# Patient Record
Sex: Female | Born: 1935 | Race: White | Hispanic: No | Smoking: Never smoker
Health system: Southern US, Community
[De-identification: ages and names within clinical notes are randomized; demographics above are authoritative.]

## PROBLEM LIST (undated history)

## (undated) DIAGNOSIS — I5189 Other ill-defined heart diseases: Secondary | ICD-10-CM

## (undated) DIAGNOSIS — Z9889 Other specified postprocedural states: Secondary | ICD-10-CM

## (undated) DIAGNOSIS — R0600 Dyspnea, unspecified: Secondary | ICD-10-CM

## (undated) DIAGNOSIS — T7840XA Allergy, unspecified, initial encounter: Secondary | ICD-10-CM

## (undated) DIAGNOSIS — Z9289 Personal history of other medical treatment: Secondary | ICD-10-CM

## (undated) DIAGNOSIS — E875 Hyperkalemia: Secondary | ICD-10-CM

## (undated) DIAGNOSIS — D126 Benign neoplasm of colon, unspecified: Secondary | ICD-10-CM

## (undated) DIAGNOSIS — E119 Type 2 diabetes mellitus without complications: Secondary | ICD-10-CM

## (undated) DIAGNOSIS — K5730 Diverticulosis of large intestine without perforation or abscess without bleeding: Secondary | ICD-10-CM

## (undated) DIAGNOSIS — R9431 Abnormal electrocardiogram [ECG] [EKG]: Secondary | ICD-10-CM

## (undated) DIAGNOSIS — K635 Polyp of colon: Secondary | ICD-10-CM

## (undated) DIAGNOSIS — K219 Gastro-esophageal reflux disease without esophagitis: Secondary | ICD-10-CM

## (undated) DIAGNOSIS — H9190 Unspecified hearing loss, unspecified ear: Secondary | ICD-10-CM

## (undated) DIAGNOSIS — N390 Urinary tract infection, site not specified: Secondary | ICD-10-CM

## (undated) DIAGNOSIS — K589 Irritable bowel syndrome without diarrhea: Secondary | ICD-10-CM

## (undated) DIAGNOSIS — F419 Anxiety disorder, unspecified: Secondary | ICD-10-CM

## (undated) DIAGNOSIS — Z5189 Encounter for other specified aftercare: Secondary | ICD-10-CM

## (undated) DIAGNOSIS — R112 Nausea with vomiting, unspecified: Secondary | ICD-10-CM

## (undated) DIAGNOSIS — R5383 Other fatigue: Secondary | ICD-10-CM

## (undated) DIAGNOSIS — E785 Hyperlipidemia, unspecified: Secondary | ICD-10-CM

## (undated) DIAGNOSIS — E049 Nontoxic goiter, unspecified: Secondary | ICD-10-CM

## (undated) DIAGNOSIS — I341 Nonrheumatic mitral (valve) prolapse: Secondary | ICD-10-CM

## (undated) DIAGNOSIS — J479 Bronchiectasis, uncomplicated: Secondary | ICD-10-CM

## (undated) DIAGNOSIS — R0609 Other forms of dyspnea: Secondary | ICD-10-CM

## (undated) DIAGNOSIS — I10 Essential (primary) hypertension: Secondary | ICD-10-CM

## (undated) DIAGNOSIS — R159 Full incontinence of feces: Secondary | ICD-10-CM

## (undated) DIAGNOSIS — R251 Tremor, unspecified: Secondary | ICD-10-CM

## (undated) DIAGNOSIS — R000 Tachycardia, unspecified: Secondary | ICD-10-CM

## (undated) DIAGNOSIS — R002 Palpitations: Secondary | ICD-10-CM

## (undated) DIAGNOSIS — R221 Localized swelling, mass and lump, neck: Secondary | ICD-10-CM

## (undated) DIAGNOSIS — D125 Benign neoplasm of sigmoid colon: Secondary | ICD-10-CM

## (undated) DIAGNOSIS — J189 Pneumonia, unspecified organism: Secondary | ICD-10-CM

## (undated) DIAGNOSIS — M1990 Unspecified osteoarthritis, unspecified site: Secondary | ICD-10-CM

## (undated) DIAGNOSIS — R011 Cardiac murmur, unspecified: Secondary | ICD-10-CM

## (undated) DIAGNOSIS — C801 Malignant (primary) neoplasm, unspecified: Secondary | ICD-10-CM

## (undated) DIAGNOSIS — F32A Depression, unspecified: Secondary | ICD-10-CM

## (undated) DIAGNOSIS — E039 Hypothyroidism, unspecified: Secondary | ICD-10-CM

## (undated) DIAGNOSIS — R918 Other nonspecific abnormal finding of lung field: Secondary | ICD-10-CM

## (undated) DIAGNOSIS — F329 Major depressive disorder, single episode, unspecified: Secondary | ICD-10-CM

## (undated) DIAGNOSIS — J8410 Pulmonary fibrosis, unspecified: Secondary | ICD-10-CM

## (undated) HISTORY — DX: Depression, unspecified: F32.A

## (undated) HISTORY — DX: Cardiac murmur, unspecified: R01.1

## (undated) HISTORY — DX: Diverticulosis of large intestine without perforation or abscess without bleeding: K57.30

## (undated) HISTORY — PX: COLONOSCOPY: SHX174

## (undated) HISTORY — DX: Irritable bowel syndrome, unspecified: K58.9

## (undated) HISTORY — DX: Abnormal electrocardiogram (ECG) (EKG): R94.31

## (undated) HISTORY — PX: SKIN CANCER EXCISION: SHX779

## (undated) HISTORY — DX: Major depressive disorder, single episode, unspecified: F32.9

## (undated) HISTORY — PX: MASTOIDECTOMY: SHX711

## (undated) HISTORY — DX: Benign neoplasm of colon, unspecified: D12.6

## (undated) HISTORY — DX: Gastro-esophageal reflux disease without esophagitis: K21.9

## (undated) HISTORY — DX: Full incontinence of feces: R15.9

## (undated) HISTORY — DX: Nonrheumatic mitral (valve) prolapse: I34.1

## (undated) HISTORY — DX: Personal history of other medical treatment: Z92.89

## (undated) HISTORY — PX: ABDOMINAL HYSTERECTOMY: SHX81

## (undated) HISTORY — DX: Urinary tract infection, site not specified: N39.0

## (undated) HISTORY — DX: Bronchiectasis, uncomplicated: J47.9

## (undated) HISTORY — DX: Benign neoplasm of sigmoid colon: D12.5

## (undated) HISTORY — DX: Dyspnea, unspecified: R06.00

## (undated) HISTORY — PX: OOPHORECTOMY: SHX86

## (undated) HISTORY — DX: Other fatigue: R53.83

## (undated) HISTORY — PX: ESOPHAGOGASTRODUODENOSCOPY: SHX1529

## (undated) HISTORY — DX: Other nonspecific abnormal finding of lung field: R91.8

## (undated) HISTORY — DX: Localized swelling, mass and lump, neck: R22.1

## (undated) HISTORY — DX: Encounter for other specified aftercare: Z51.89

## (undated) HISTORY — DX: Malignant (primary) neoplasm, unspecified: C80.1

## (undated) HISTORY — DX: Other specified postprocedural states: R11.2

## (undated) HISTORY — DX: Polyp of colon: K63.5

## (undated) HISTORY — DX: Hypothyroidism, unspecified: E03.9

## (undated) HISTORY — DX: Other forms of dyspnea: R06.09

## (undated) HISTORY — DX: Other ill-defined heart diseases: I51.89

## (undated) HISTORY — DX: Other specified postprocedural states: Z98.890

## (undated) HISTORY — DX: Allergy, unspecified, initial encounter: T78.40XA

## (undated) HISTORY — DX: Nontoxic goiter, unspecified: E04.9

## (undated) HISTORY — DX: Palpitations: R00.2

## (undated) HISTORY — DX: Type 2 diabetes mellitus without complications: E11.9

## (undated) HISTORY — DX: Anxiety disorder, unspecified: F41.9

## (undated) HISTORY — DX: Hyperlipidemia, unspecified: E78.5

## (undated) HISTORY — DX: Essential (primary) hypertension: I10

---

## 1972-12-03 DIAGNOSIS — Z5189 Encounter for other specified aftercare: Secondary | ICD-10-CM

## 1972-12-03 DIAGNOSIS — IMO0001 Reserved for inherently not codable concepts without codable children: Secondary | ICD-10-CM

## 1972-12-03 HISTORY — DX: Encounter for other specified aftercare: Z51.89

## 1972-12-03 HISTORY — DX: Reserved for inherently not codable concepts without codable children: IMO0001

## 2001-09-24 ENCOUNTER — Encounter: Payer: Self-pay | Admitting: Gynecology

## 2001-09-24 ENCOUNTER — Ambulatory Visit (HOSPITAL_COMMUNITY): Admission: RE | Admit: 2001-09-24 | Discharge: 2001-09-24 | Payer: Self-pay | Admitting: Gynecology

## 2002-09-30 ENCOUNTER — Ambulatory Visit (HOSPITAL_COMMUNITY): Admission: RE | Admit: 2002-09-30 | Discharge: 2002-09-30 | Payer: Self-pay | Admitting: Pulmonary Disease

## 2003-10-18 ENCOUNTER — Ambulatory Visit (HOSPITAL_COMMUNITY): Admission: RE | Admit: 2003-10-18 | Discharge: 2003-10-18 | Payer: Self-pay | Admitting: Pulmonary Disease

## 2005-01-15 ENCOUNTER — Ambulatory Visit (HOSPITAL_COMMUNITY): Admission: RE | Admit: 2005-01-15 | Discharge: 2005-01-15 | Payer: Self-pay | Admitting: Obstetrics and Gynecology

## 2006-01-07 ENCOUNTER — Ambulatory Visit: Payer: Self-pay | Admitting: Family Medicine

## 2006-01-18 ENCOUNTER — Ambulatory Visit (HOSPITAL_COMMUNITY): Admission: RE | Admit: 2006-01-18 | Discharge: 2006-01-18 | Payer: Self-pay | Admitting: Family Medicine

## 2006-01-21 ENCOUNTER — Ambulatory Visit: Payer: Self-pay | Admitting: Family Medicine

## 2006-02-26 ENCOUNTER — Ambulatory Visit: Payer: Self-pay | Admitting: Family Medicine

## 2006-04-11 ENCOUNTER — Ambulatory Visit (HOSPITAL_COMMUNITY): Admission: RE | Admit: 2006-04-11 | Discharge: 2006-04-11 | Payer: Self-pay | Admitting: Family Medicine

## 2006-04-11 ENCOUNTER — Ambulatory Visit: Payer: Self-pay | Admitting: Family Medicine

## 2006-04-25 ENCOUNTER — Ambulatory Visit: Payer: Self-pay | Admitting: Family Medicine

## 2006-05-30 ENCOUNTER — Ambulatory Visit: Payer: Self-pay | Admitting: Family Medicine

## 2006-08-26 ENCOUNTER — Ambulatory Visit: Payer: Self-pay | Admitting: Family Medicine

## 2006-09-23 ENCOUNTER — Ambulatory Visit: Payer: Self-pay | Admitting: Family Medicine

## 2006-10-01 ENCOUNTER — Encounter: Admission: RE | Admit: 2006-10-01 | Discharge: 2006-10-01 | Payer: Self-pay | Admitting: Orthopedic Surgery

## 2006-10-07 ENCOUNTER — Ambulatory Visit: Payer: Self-pay | Admitting: Family Medicine

## 2006-11-18 ENCOUNTER — Ambulatory Visit: Payer: Self-pay | Admitting: Family Medicine

## 2007-01-07 ENCOUNTER — Encounter: Payer: Self-pay | Admitting: Family Medicine

## 2007-01-07 DIAGNOSIS — K589 Irritable bowel syndrome without diarrhea: Secondary | ICD-10-CM | POA: Insufficient documentation

## 2007-01-07 DIAGNOSIS — M1990 Unspecified osteoarthritis, unspecified site: Secondary | ICD-10-CM | POA: Insufficient documentation

## 2007-01-07 DIAGNOSIS — I10 Essential (primary) hypertension: Secondary | ICD-10-CM

## 2007-01-07 DIAGNOSIS — E039 Hypothyroidism, unspecified: Secondary | ICD-10-CM

## 2007-01-07 DIAGNOSIS — F419 Anxiety disorder, unspecified: Secondary | ICD-10-CM | POA: Insufficient documentation

## 2007-01-07 DIAGNOSIS — E785 Hyperlipidemia, unspecified: Secondary | ICD-10-CM

## 2007-01-07 DIAGNOSIS — G43909 Migraine, unspecified, not intractable, without status migrainosus: Secondary | ICD-10-CM | POA: Insufficient documentation

## 2007-01-07 DIAGNOSIS — F329 Major depressive disorder, single episode, unspecified: Secondary | ICD-10-CM

## 2007-01-07 DIAGNOSIS — K219 Gastro-esophageal reflux disease without esophagitis: Secondary | ICD-10-CM | POA: Insufficient documentation

## 2007-01-07 DIAGNOSIS — H9190 Unspecified hearing loss, unspecified ear: Secondary | ICD-10-CM | POA: Insufficient documentation

## 2007-01-07 DIAGNOSIS — H4089 Other specified glaucoma: Secondary | ICD-10-CM

## 2007-01-07 DIAGNOSIS — J309 Allergic rhinitis, unspecified: Secondary | ICD-10-CM

## 2007-01-27 ENCOUNTER — Telehealth (INDEPENDENT_AMBULATORY_CARE_PROVIDER_SITE_OTHER): Payer: Self-pay | Admitting: Family Medicine

## 2007-01-27 ENCOUNTER — Encounter: Payer: Self-pay | Admitting: Nurse Practitioner

## 2007-01-29 ENCOUNTER — Ambulatory Visit (HOSPITAL_COMMUNITY): Admission: RE | Admit: 2007-01-29 | Discharge: 2007-01-29 | Payer: Self-pay | Admitting: Family Medicine

## 2007-01-29 ENCOUNTER — Encounter (INDEPENDENT_AMBULATORY_CARE_PROVIDER_SITE_OTHER): Payer: Self-pay | Admitting: Family Medicine

## 2007-03-04 DIAGNOSIS — D126 Benign neoplasm of colon, unspecified: Secondary | ICD-10-CM

## 2007-03-04 HISTORY — DX: Benign neoplasm of colon, unspecified: D12.6

## 2007-03-05 ENCOUNTER — Ambulatory Visit: Payer: Self-pay | Admitting: Internal Medicine

## 2007-03-05 ENCOUNTER — Encounter (INDEPENDENT_AMBULATORY_CARE_PROVIDER_SITE_OTHER): Payer: Self-pay | Admitting: Family Medicine

## 2007-03-05 ENCOUNTER — Encounter (INDEPENDENT_AMBULATORY_CARE_PROVIDER_SITE_OTHER): Payer: Self-pay | Admitting: *Deleted

## 2007-03-05 ENCOUNTER — Encounter (INDEPENDENT_AMBULATORY_CARE_PROVIDER_SITE_OTHER): Payer: Self-pay | Admitting: Specialist

## 2007-03-05 ENCOUNTER — Ambulatory Visit (HOSPITAL_COMMUNITY): Admission: RE | Admit: 2007-03-05 | Discharge: 2007-03-05 | Payer: Self-pay | Admitting: Internal Medicine

## 2007-03-05 ENCOUNTER — Encounter: Payer: Self-pay | Admitting: Nurse Practitioner

## 2007-03-07 ENCOUNTER — Telehealth (INDEPENDENT_AMBULATORY_CARE_PROVIDER_SITE_OTHER): Payer: Self-pay | Admitting: Family Medicine

## 2007-03-11 ENCOUNTER — Ambulatory Visit: Payer: Self-pay | Admitting: Family Medicine

## 2007-03-11 LAB — CONVERTED CEMR LAB
Bilirubin Urine: NEGATIVE
Blood in Urine, dipstick: NEGATIVE
Glucose, Urine, Semiquant: NEGATIVE
pH: 7

## 2007-03-14 ENCOUNTER — Encounter (INDEPENDENT_AMBULATORY_CARE_PROVIDER_SITE_OTHER): Payer: Self-pay | Admitting: Family Medicine

## 2007-03-17 ENCOUNTER — Telehealth (INDEPENDENT_AMBULATORY_CARE_PROVIDER_SITE_OTHER): Payer: Self-pay | Admitting: Family Medicine

## 2007-03-24 ENCOUNTER — Telehealth (INDEPENDENT_AMBULATORY_CARE_PROVIDER_SITE_OTHER): Payer: Self-pay | Admitting: *Deleted

## 2007-03-27 ENCOUNTER — Telehealth (INDEPENDENT_AMBULATORY_CARE_PROVIDER_SITE_OTHER): Payer: Self-pay | Admitting: Family Medicine

## 2007-03-31 ENCOUNTER — Ambulatory Visit (HOSPITAL_COMMUNITY): Admission: RE | Admit: 2007-03-31 | Discharge: 2007-03-31 | Payer: Self-pay | Admitting: Family Medicine

## 2007-04-18 ENCOUNTER — Ambulatory Visit: Payer: Self-pay | Admitting: Family Medicine

## 2007-04-18 LAB — CONVERTED CEMR LAB
Glucose, Urine, Semiquant: NEGATIVE
Ketones, urine, test strip: NEGATIVE
Urobilinogen, UA: 0.2
pH: 5.5

## 2007-04-21 ENCOUNTER — Telehealth (INDEPENDENT_AMBULATORY_CARE_PROVIDER_SITE_OTHER): Payer: Self-pay | Admitting: Family Medicine

## 2007-04-21 LAB — CONVERTED CEMR LAB: RBC / HPF: NONE SEEN (ref <3)

## 2007-04-22 ENCOUNTER — Encounter (INDEPENDENT_AMBULATORY_CARE_PROVIDER_SITE_OTHER): Payer: Self-pay | Admitting: Family Medicine

## 2007-04-24 ENCOUNTER — Encounter (INDEPENDENT_AMBULATORY_CARE_PROVIDER_SITE_OTHER): Payer: Self-pay | Admitting: Family Medicine

## 2007-05-02 ENCOUNTER — Encounter (INDEPENDENT_AMBULATORY_CARE_PROVIDER_SITE_OTHER): Payer: Self-pay | Admitting: Family Medicine

## 2007-05-07 ENCOUNTER — Encounter (INDEPENDENT_AMBULATORY_CARE_PROVIDER_SITE_OTHER): Payer: Self-pay | Admitting: Family Medicine

## 2007-05-07 LAB — CONVERTED CEMR LAB
Ketones, ur: NEGATIVE mg/dL
Nitrite: NEGATIVE
Protein, ur: NEGATIVE mg/dL

## 2007-05-08 ENCOUNTER — Encounter (INDEPENDENT_AMBULATORY_CARE_PROVIDER_SITE_OTHER): Payer: Self-pay | Admitting: Family Medicine

## 2007-05-09 ENCOUNTER — Encounter: Payer: Self-pay | Admitting: Family Medicine

## 2007-05-09 ENCOUNTER — Ambulatory Visit: Payer: Self-pay | Admitting: Family Medicine

## 2007-05-09 ENCOUNTER — Encounter (INDEPENDENT_AMBULATORY_CARE_PROVIDER_SITE_OTHER): Payer: Self-pay | Admitting: Family Medicine

## 2007-05-09 LAB — CONVERTED CEMR LAB
Bilirubin Urine: NEGATIVE
Glucose, Urine, Semiquant: NEGATIVE
Nitrite: NEGATIVE
Protein, U semiquant: NEGATIVE
Specific Gravity, Urine: 1.015
pH: 7

## 2007-05-10 ENCOUNTER — Encounter (INDEPENDENT_AMBULATORY_CARE_PROVIDER_SITE_OTHER): Payer: Self-pay | Admitting: Family Medicine

## 2007-05-10 LAB — CONVERTED CEMR LAB: RBC / HPF: NONE SEEN (ref <3)

## 2007-05-12 ENCOUNTER — Encounter (INDEPENDENT_AMBULATORY_CARE_PROVIDER_SITE_OTHER): Payer: Self-pay | Admitting: Family Medicine

## 2007-05-13 ENCOUNTER — Ambulatory Visit (HOSPITAL_COMMUNITY): Admission: RE | Admit: 2007-05-13 | Discharge: 2007-05-13 | Payer: Self-pay | Admitting: Family Medicine

## 2007-05-19 ENCOUNTER — Telehealth (INDEPENDENT_AMBULATORY_CARE_PROVIDER_SITE_OTHER): Payer: Self-pay | Admitting: Family Medicine

## 2007-05-19 ENCOUNTER — Encounter (INDEPENDENT_AMBULATORY_CARE_PROVIDER_SITE_OTHER): Payer: Self-pay | Admitting: Family Medicine

## 2007-05-23 ENCOUNTER — Ambulatory Visit: Payer: Self-pay | Admitting: Internal Medicine

## 2007-05-23 ENCOUNTER — Telehealth (INDEPENDENT_AMBULATORY_CARE_PROVIDER_SITE_OTHER): Payer: Self-pay | Admitting: Family Medicine

## 2007-06-03 ENCOUNTER — Encounter (INDEPENDENT_AMBULATORY_CARE_PROVIDER_SITE_OTHER): Payer: Self-pay | Admitting: Family Medicine

## 2007-06-10 ENCOUNTER — Ambulatory Visit: Payer: Self-pay | Admitting: Family Medicine

## 2007-06-10 ENCOUNTER — Telehealth (INDEPENDENT_AMBULATORY_CARE_PROVIDER_SITE_OTHER): Payer: Self-pay | Admitting: Family Medicine

## 2007-06-10 DIAGNOSIS — N390 Urinary tract infection, site not specified: Secondary | ICD-10-CM

## 2007-06-10 DIAGNOSIS — R159 Full incontinence of feces: Secondary | ICD-10-CM | POA: Insufficient documentation

## 2007-06-10 LAB — CONVERTED CEMR LAB
Glucose, Urine, Semiquant: NEGATIVE
Ketones, urine, test strip: NEGATIVE
Specific Gravity, Urine: 1.01

## 2007-06-11 ENCOUNTER — Encounter (INDEPENDENT_AMBULATORY_CARE_PROVIDER_SITE_OTHER): Payer: Self-pay | Admitting: Family Medicine

## 2007-06-11 ENCOUNTER — Telehealth (INDEPENDENT_AMBULATORY_CARE_PROVIDER_SITE_OTHER): Payer: Self-pay | Admitting: *Deleted

## 2007-06-11 LAB — CONVERTED CEMR LAB: RBC / HPF: NONE SEEN (ref <3)

## 2007-07-04 ENCOUNTER — Encounter (INDEPENDENT_AMBULATORY_CARE_PROVIDER_SITE_OTHER): Payer: Self-pay | Admitting: Family Medicine

## 2007-07-28 ENCOUNTER — Telehealth (INDEPENDENT_AMBULATORY_CARE_PROVIDER_SITE_OTHER): Payer: Self-pay | Admitting: *Deleted

## 2007-07-28 ENCOUNTER — Telehealth (INDEPENDENT_AMBULATORY_CARE_PROVIDER_SITE_OTHER): Payer: Self-pay | Admitting: Family Medicine

## 2007-08-12 ENCOUNTER — Encounter (INDEPENDENT_AMBULATORY_CARE_PROVIDER_SITE_OTHER): Payer: Self-pay | Admitting: Family Medicine

## 2007-08-12 ENCOUNTER — Ambulatory Visit: Payer: Self-pay | Admitting: Gastroenterology

## 2007-08-22 ENCOUNTER — Ambulatory Visit: Payer: Self-pay | Admitting: Family Medicine

## 2007-08-22 LAB — CONVERTED CEMR LAB
HDL goal, serum: 40 mg/dL
LDL Goal: 130 mg/dL

## 2007-08-25 ENCOUNTER — Encounter (INDEPENDENT_AMBULATORY_CARE_PROVIDER_SITE_OTHER): Payer: Self-pay | Admitting: Family Medicine

## 2007-08-26 ENCOUNTER — Telehealth (INDEPENDENT_AMBULATORY_CARE_PROVIDER_SITE_OTHER): Payer: Self-pay | Admitting: *Deleted

## 2007-08-26 LAB — CONVERTED CEMR LAB
ALT: 11 units/L (ref 0–35)
Alkaline Phosphatase: 53 units/L (ref 39–117)
Basophils Absolute: 0.1 10*3/uL (ref 0.0–0.1)
Calcium: 9.2 mg/dL (ref 8.4–10.5)
Cholesterol: 196 mg/dL (ref 0–200)
Creatinine, Ser: 0.93 mg/dL (ref 0.40–1.20)
Eosinophils Absolute: 0.4 10*3/uL (ref 0.0–0.7)
Glucose, Bld: 100 mg/dL — ABNORMAL HIGH (ref 70–99)
HCT: 46.3 % — ABNORMAL HIGH (ref 36.0–46.0)
HDL: 49 mg/dL (ref >39)
LDL Cholesterol: 111 mg/dL — ABNORMAL HIGH (ref 0–99)
Lymphs Abs: 1.5 10*3/uL (ref 0.7–3.3)
Monocytes Absolute: 0.3 10*3/uL (ref 0.2–0.7)
Monocytes Relative: 5 % (ref 3–11)
Neutro Abs: 3.4 10*3/uL (ref 1.7–7.7)
Neutrophils Relative %: 61 % (ref 43–77)
Platelets: 222 10*3/uL (ref 150–400)
Potassium: 4.5 meq/L (ref 3.5–5.3)
Total Bilirubin: 0.7 mg/dL (ref 0.3–1.2)
Total Protein: 6.8 g/dL (ref 6.0–8.3)
Triglycerides: 181 mg/dL — ABNORMAL HIGH (ref <150)
VLDL: 36 mg/dL (ref 0–40)
WBC: 5.6 10*3/uL (ref 4.0–10.5)

## 2007-09-17 ENCOUNTER — Ambulatory Visit: Payer: Self-pay | Admitting: Gastroenterology

## 2007-10-03 ENCOUNTER — Ambulatory Visit: Payer: Self-pay | Admitting: Family Medicine

## 2007-10-23 ENCOUNTER — Encounter (INDEPENDENT_AMBULATORY_CARE_PROVIDER_SITE_OTHER): Payer: Self-pay | Admitting: Family Medicine

## 2007-12-05 ENCOUNTER — Telehealth (INDEPENDENT_AMBULATORY_CARE_PROVIDER_SITE_OTHER): Payer: Self-pay | Admitting: Family Medicine

## 2007-12-18 ENCOUNTER — Encounter (INDEPENDENT_AMBULATORY_CARE_PROVIDER_SITE_OTHER): Payer: Self-pay | Admitting: Family Medicine

## 2007-12-29 ENCOUNTER — Ambulatory Visit: Payer: Self-pay | Admitting: Family Medicine

## 2008-01-01 ENCOUNTER — Telehealth (INDEPENDENT_AMBULATORY_CARE_PROVIDER_SITE_OTHER): Payer: Self-pay | Admitting: Family Medicine

## 2008-01-12 ENCOUNTER — Ambulatory Visit: Payer: Self-pay | Admitting: Family Medicine

## 2008-01-12 ENCOUNTER — Telehealth (INDEPENDENT_AMBULATORY_CARE_PROVIDER_SITE_OTHER): Payer: Self-pay | Admitting: *Deleted

## 2008-01-12 DIAGNOSIS — I059 Rheumatic mitral valve disease, unspecified: Secondary | ICD-10-CM | POA: Insufficient documentation

## 2008-01-13 ENCOUNTER — Telehealth (INDEPENDENT_AMBULATORY_CARE_PROVIDER_SITE_OTHER): Payer: Self-pay | Admitting: *Deleted

## 2008-01-13 ENCOUNTER — Encounter (INDEPENDENT_AMBULATORY_CARE_PROVIDER_SITE_OTHER): Payer: Self-pay | Admitting: Family Medicine

## 2008-01-13 LAB — CONVERTED CEMR LAB
ALT: 13 units/L (ref 0–35)
AST: 22 units/L (ref 0–37)
Basophils Absolute: 0.1 10*3/uL (ref 0.0–0.1)
Calcium: 10 mg/dL (ref 8.4–10.5)
Creatinine, Ser: 0.9 mg/dL (ref 0.40–1.20)
Eosinophils Relative: 5 % (ref 0–5)
Glucose, Bld: 95 mg/dL (ref 70–99)
Lymphs Abs: 1.8 10*3/uL (ref 0.7–4.0)
MCHC: 32.3 g/dL (ref 30.0–36.0)
MCV: 93 fL (ref 78.0–100.0)
Monocytes Absolute: 0.5 10*3/uL (ref 0.1–1.0)
Neutro Abs: 4.3 10*3/uL (ref 1.7–7.7)
Neutrophils Relative %: 62 % (ref 43–77)
TSH: 2.615 microintl units/mL (ref 0.350–5.50)
Total Protein: 7.2 g/dL (ref 6.0–8.3)

## 2008-02-09 ENCOUNTER — Encounter (INDEPENDENT_AMBULATORY_CARE_PROVIDER_SITE_OTHER): Payer: Self-pay | Admitting: Family Medicine

## 2008-02-11 ENCOUNTER — Encounter (INDEPENDENT_AMBULATORY_CARE_PROVIDER_SITE_OTHER): Payer: Self-pay | Admitting: Family Medicine

## 2008-02-12 LAB — CONVERTED CEMR LAB
Cholesterol: 217 mg/dL
HDL: 51 mg/dL
LDL Cholesterol: 138 mg/dL

## 2008-02-18 ENCOUNTER — Ambulatory Visit (HOSPITAL_COMMUNITY): Admission: RE | Admit: 2008-02-18 | Discharge: 2008-02-18 | Payer: Self-pay | Admitting: Family Medicine

## 2008-02-23 ENCOUNTER — Telehealth (INDEPENDENT_AMBULATORY_CARE_PROVIDER_SITE_OTHER): Payer: Self-pay | Admitting: *Deleted

## 2008-02-26 ENCOUNTER — Encounter (INDEPENDENT_AMBULATORY_CARE_PROVIDER_SITE_OTHER): Payer: Self-pay | Admitting: Family Medicine

## 2008-03-03 ENCOUNTER — Encounter: Payer: Self-pay | Admitting: Internal Medicine

## 2008-03-03 ENCOUNTER — Encounter (INDEPENDENT_AMBULATORY_CARE_PROVIDER_SITE_OTHER): Payer: Self-pay | Admitting: Family Medicine

## 2008-03-03 ENCOUNTER — Ambulatory Visit (HOSPITAL_COMMUNITY): Admission: RE | Admit: 2008-03-03 | Discharge: 2008-03-03 | Payer: Self-pay | Admitting: Otolaryngology

## 2008-03-09 ENCOUNTER — Encounter (INDEPENDENT_AMBULATORY_CARE_PROVIDER_SITE_OTHER): Payer: Self-pay | Admitting: Family Medicine

## 2008-04-19 ENCOUNTER — Telehealth (INDEPENDENT_AMBULATORY_CARE_PROVIDER_SITE_OTHER): Payer: Self-pay | Admitting: *Deleted

## 2008-04-20 ENCOUNTER — Ambulatory Visit: Payer: Self-pay | Admitting: Family Medicine

## 2008-04-20 DIAGNOSIS — K449 Diaphragmatic hernia without obstruction or gangrene: Secondary | ICD-10-CM | POA: Insufficient documentation

## 2008-04-22 ENCOUNTER — Telehealth (INDEPENDENT_AMBULATORY_CARE_PROVIDER_SITE_OTHER): Payer: Self-pay | Admitting: *Deleted

## 2008-06-01 ENCOUNTER — Ambulatory Visit: Payer: Self-pay | Admitting: Family Medicine

## 2008-06-01 DIAGNOSIS — D179 Benign lipomatous neoplasm, unspecified: Secondary | ICD-10-CM | POA: Insufficient documentation

## 2008-06-01 LAB — CONVERTED CEMR LAB
Bilirubin Urine: NEGATIVE
Blood in Urine, dipstick: NEGATIVE
Ketones, urine, test strip: NEGATIVE
Protein, U semiquant: NEGATIVE
pH: 6.5

## 2008-06-07 ENCOUNTER — Ambulatory Visit: Payer: Self-pay | Admitting: Family Medicine

## 2008-06-11 ENCOUNTER — Ambulatory Visit: Payer: Self-pay | Admitting: Internal Medicine

## 2008-06-21 ENCOUNTER — Ambulatory Visit: Payer: Self-pay | Admitting: Family Medicine

## 2008-07-05 ENCOUNTER — Telehealth (INDEPENDENT_AMBULATORY_CARE_PROVIDER_SITE_OTHER): Payer: Self-pay | Admitting: *Deleted

## 2008-07-12 ENCOUNTER — Ambulatory Visit: Payer: Self-pay | Admitting: Internal Medicine

## 2008-08-23 ENCOUNTER — Ambulatory Visit: Payer: Self-pay | Admitting: Internal Medicine

## 2008-08-23 ENCOUNTER — Telehealth: Payer: Self-pay | Admitting: Internal Medicine

## 2008-08-23 ENCOUNTER — Encounter (INDEPENDENT_AMBULATORY_CARE_PROVIDER_SITE_OTHER): Payer: Self-pay | Admitting: Family Medicine

## 2008-08-23 ENCOUNTER — Encounter: Payer: Self-pay | Admitting: Internal Medicine

## 2008-09-01 ENCOUNTER — Ambulatory Visit (HOSPITAL_COMMUNITY): Admission: RE | Admit: 2008-09-01 | Discharge: 2008-09-01 | Payer: Self-pay | Admitting: Internal Medicine

## 2008-09-01 ENCOUNTER — Ambulatory Visit: Payer: Self-pay | Admitting: Family Medicine

## 2008-09-01 ENCOUNTER — Encounter: Payer: Self-pay | Admitting: Internal Medicine

## 2008-09-01 DIAGNOSIS — M549 Dorsalgia, unspecified: Secondary | ICD-10-CM | POA: Insufficient documentation

## 2008-09-01 LAB — CONVERTED CEMR LAB
Bilirubin Urine: NEGATIVE
Glucose, Urine, Semiquant: NEGATIVE
Protein, U semiquant: NEGATIVE
Specific Gravity, Urine: 1.01
Urobilinogen, UA: 0.2

## 2008-09-22 ENCOUNTER — Encounter: Payer: Self-pay | Admitting: Internal Medicine

## 2008-09-27 DIAGNOSIS — Z860100 Personal history of colon polyps, unspecified: Secondary | ICD-10-CM | POA: Insufficient documentation

## 2008-09-28 ENCOUNTER — Encounter: Payer: Self-pay | Admitting: Nurse Practitioner

## 2008-09-28 ENCOUNTER — Ambulatory Visit: Payer: Self-pay | Admitting: Internal Medicine

## 2008-10-08 ENCOUNTER — Ambulatory Visit: Payer: Self-pay | Admitting: Family Medicine

## 2008-10-09 ENCOUNTER — Encounter (INDEPENDENT_AMBULATORY_CARE_PROVIDER_SITE_OTHER): Payer: Self-pay | Admitting: Family Medicine

## 2008-10-11 LAB — CONVERTED CEMR LAB: TSH: 2.413 microintl units/mL (ref 0.350–4.50)

## 2008-10-12 ENCOUNTER — Telehealth (INDEPENDENT_AMBULATORY_CARE_PROVIDER_SITE_OTHER): Payer: Self-pay | Admitting: Family Medicine

## 2008-10-13 ENCOUNTER — Encounter (INDEPENDENT_AMBULATORY_CARE_PROVIDER_SITE_OTHER): Payer: Self-pay | Admitting: Family Medicine

## 2008-10-13 ENCOUNTER — Ambulatory Visit (HOSPITAL_COMMUNITY): Admission: RE | Admit: 2008-10-13 | Discharge: 2008-10-13 | Payer: Self-pay | Admitting: Family Medicine

## 2008-10-22 ENCOUNTER — Encounter (INDEPENDENT_AMBULATORY_CARE_PROVIDER_SITE_OTHER): Payer: Self-pay | Admitting: Diagnostic Radiology

## 2008-10-22 ENCOUNTER — Encounter (INDEPENDENT_AMBULATORY_CARE_PROVIDER_SITE_OTHER): Payer: Self-pay | Admitting: Family Medicine

## 2008-10-22 ENCOUNTER — Ambulatory Visit (HOSPITAL_COMMUNITY): Admission: RE | Admit: 2008-10-22 | Discharge: 2008-10-22 | Payer: Self-pay | Admitting: Family Medicine

## 2008-11-02 ENCOUNTER — Ambulatory Visit: Payer: Self-pay | Admitting: Internal Medicine

## 2008-11-08 ENCOUNTER — Ambulatory Visit: Payer: Self-pay | Admitting: Internal Medicine

## 2008-11-15 ENCOUNTER — Telehealth: Payer: Self-pay | Admitting: Internal Medicine

## 2008-11-15 ENCOUNTER — Ambulatory Visit: Payer: Self-pay | Admitting: Cardiology

## 2008-11-15 LAB — CONVERTED CEMR LAB
BUN: 11 mg/dL (ref 6–23)
Chloride: 103 meq/L (ref 96–112)
Eosinophils Relative: 5 % (ref 0.0–5.0)
Glucose, Bld: 105 mg/dL — ABNORMAL HIGH (ref 70–99)
Lymphocytes Relative: 23.1 % (ref 12.0–46.0)
Monocytes Relative: 5.7 % (ref 3.0–12.0)
Neutrophils Relative %: 65.7 % (ref 43.0–77.0)
Platelets: 241 10*3/uL (ref 150–400)
Potassium: 5.2 meq/L — ABNORMAL HIGH (ref 3.5–5.1)
WBC: 9 10*3/uL (ref 4.5–10.5)

## 2008-12-13 ENCOUNTER — Ambulatory Visit: Payer: Self-pay | Admitting: Internal Medicine

## 2009-01-28 ENCOUNTER — Telehealth (INDEPENDENT_AMBULATORY_CARE_PROVIDER_SITE_OTHER): Payer: Self-pay | Admitting: *Deleted

## 2009-01-28 ENCOUNTER — Encounter (INDEPENDENT_AMBULATORY_CARE_PROVIDER_SITE_OTHER): Payer: Self-pay | Admitting: Family Medicine

## 2009-01-28 ENCOUNTER — Ambulatory Visit: Payer: Self-pay | Admitting: Internal Medicine

## 2009-01-28 DIAGNOSIS — S139XXA Sprain of joints and ligaments of unspecified parts of neck, initial encounter: Secondary | ICD-10-CM | POA: Insufficient documentation

## 2009-01-31 ENCOUNTER — Ambulatory Visit: Payer: Self-pay | Admitting: Internal Medicine

## 2009-02-04 ENCOUNTER — Telehealth (INDEPENDENT_AMBULATORY_CARE_PROVIDER_SITE_OTHER): Payer: Self-pay | Admitting: *Deleted

## 2009-02-18 ENCOUNTER — Ambulatory Visit (HOSPITAL_COMMUNITY): Admission: RE | Admit: 2009-02-18 | Discharge: 2009-02-18 | Payer: Self-pay | Admitting: Family Medicine

## 2009-02-28 ENCOUNTER — Ambulatory Visit: Payer: Self-pay | Admitting: Family Medicine

## 2009-03-01 ENCOUNTER — Encounter (INDEPENDENT_AMBULATORY_CARE_PROVIDER_SITE_OTHER): Payer: Self-pay | Admitting: Family Medicine

## 2009-03-03 ENCOUNTER — Telehealth (INDEPENDENT_AMBULATORY_CARE_PROVIDER_SITE_OTHER): Payer: Self-pay | Admitting: *Deleted

## 2009-03-03 LAB — CONVERTED CEMR LAB
ALT: 11 units/L (ref 0–35)
Albumin: 4.2 g/dL (ref 3.5–5.2)
CO2: 27 meq/L (ref 19–32)
Calcium: 9.7 mg/dL (ref 8.4–10.5)
Chloride: 107 meq/L (ref 96–112)
Cholesterol: 201 mg/dL — ABNORMAL HIGH (ref 0–200)
Creatinine, Ser: 1.03 mg/dL (ref 0.40–1.20)
Eosinophils Absolute: 0.4 10*3/uL (ref 0.0–0.7)
Eosinophils Relative: 7 % — ABNORMAL HIGH (ref 0–5)
HCT: 44 % (ref 36.0–46.0)
Lymphs Abs: 1.8 10*3/uL (ref 0.7–4.0)
MCV: 90.5 fL (ref 78.0–100.0)
Monocytes Relative: 6 % (ref 3–12)
Neutrophils Relative %: 56 % (ref 43–77)
Potassium: 5 meq/L (ref 3.5–5.3)
RBC: 4.86 M/uL (ref 3.87–5.11)
Total CHOL/HDL Ratio: 4.3
WBC: 6.3 10*3/uL (ref 4.0–10.5)

## 2009-03-07 ENCOUNTER — Encounter (INDEPENDENT_AMBULATORY_CARE_PROVIDER_SITE_OTHER): Payer: Self-pay | Admitting: Family Medicine

## 2009-03-07 ENCOUNTER — Ambulatory Visit (HOSPITAL_COMMUNITY): Admission: RE | Admit: 2009-03-07 | Discharge: 2009-03-07 | Payer: Self-pay | Admitting: Family Medicine

## 2009-03-11 ENCOUNTER — Encounter (INDEPENDENT_AMBULATORY_CARE_PROVIDER_SITE_OTHER): Payer: Self-pay | Admitting: Family Medicine

## 2009-03-11 ENCOUNTER — Encounter: Payer: Self-pay | Admitting: Internal Medicine

## 2009-03-11 ENCOUNTER — Telehealth (INDEPENDENT_AMBULATORY_CARE_PROVIDER_SITE_OTHER): Payer: Self-pay | Admitting: *Deleted

## 2009-03-16 ENCOUNTER — Encounter (INDEPENDENT_AMBULATORY_CARE_PROVIDER_SITE_OTHER): Payer: Self-pay | Admitting: Family Medicine

## 2009-03-17 ENCOUNTER — Ambulatory Visit: Payer: Self-pay | Admitting: Family Medicine

## 2009-03-17 DIAGNOSIS — M949 Disorder of cartilage, unspecified: Secondary | ICD-10-CM

## 2009-03-17 DIAGNOSIS — M899 Disorder of bone, unspecified: Secondary | ICD-10-CM | POA: Insufficient documentation

## 2009-03-17 DIAGNOSIS — M169 Osteoarthritis of hip, unspecified: Secondary | ICD-10-CM

## 2009-04-02 HISTORY — PX: TOTAL HIP ARTHROPLASTY: SHX124

## 2009-04-05 ENCOUNTER — Encounter: Payer: Self-pay | Admitting: Internal Medicine

## 2009-04-20 ENCOUNTER — Inpatient Hospital Stay (HOSPITAL_COMMUNITY): Admission: RE | Admit: 2009-04-20 | Discharge: 2009-04-22 | Payer: Self-pay | Admitting: Orthopedic Surgery

## 2009-04-22 ENCOUNTER — Inpatient Hospital Stay: Admission: RE | Admit: 2009-04-22 | Discharge: 2009-05-10 | Payer: Self-pay | Admitting: Internal Medicine

## 2009-04-25 ENCOUNTER — Telehealth (INDEPENDENT_AMBULATORY_CARE_PROVIDER_SITE_OTHER): Payer: Self-pay | Admitting: Family Medicine

## 2009-05-13 ENCOUNTER — Telehealth (INDEPENDENT_AMBULATORY_CARE_PROVIDER_SITE_OTHER): Payer: Self-pay | Admitting: Family Medicine

## 2009-06-08 ENCOUNTER — Ambulatory Visit: Payer: Self-pay | Admitting: Family Medicine

## 2009-06-09 ENCOUNTER — Encounter (INDEPENDENT_AMBULATORY_CARE_PROVIDER_SITE_OTHER): Payer: Self-pay | Admitting: Family Medicine

## 2009-09-13 ENCOUNTER — Encounter (INDEPENDENT_AMBULATORY_CARE_PROVIDER_SITE_OTHER): Payer: Self-pay | Admitting: Family Medicine

## 2009-09-26 ENCOUNTER — Ambulatory Visit: Payer: Self-pay | Admitting: Internal Medicine

## 2009-09-26 DIAGNOSIS — J479 Bronchiectasis, uncomplicated: Secondary | ICD-10-CM

## 2009-09-26 DIAGNOSIS — R042 Hemoptysis: Secondary | ICD-10-CM | POA: Insufficient documentation

## 2009-10-24 ENCOUNTER — Ambulatory Visit (HOSPITAL_COMMUNITY): Admission: RE | Admit: 2009-10-24 | Discharge: 2009-10-24 | Payer: Self-pay | Admitting: Urology

## 2010-02-03 ENCOUNTER — Encounter: Payer: Self-pay | Admitting: Internal Medicine

## 2010-02-21 ENCOUNTER — Ambulatory Visit (HOSPITAL_COMMUNITY): Admission: RE | Admit: 2010-02-21 | Discharge: 2010-02-21 | Payer: Self-pay | Admitting: Family Medicine

## 2010-03-03 ENCOUNTER — Telehealth (INDEPENDENT_AMBULATORY_CARE_PROVIDER_SITE_OTHER): Payer: Self-pay | Admitting: *Deleted

## 2010-03-03 ENCOUNTER — Ambulatory Visit: Payer: Self-pay | Admitting: Internal Medicine

## 2010-04-17 ENCOUNTER — Ambulatory Visit: Payer: Self-pay | Admitting: Internal Medicine

## 2010-04-17 DIAGNOSIS — R05 Cough: Secondary | ICD-10-CM | POA: Insufficient documentation

## 2010-04-19 LAB — CONVERTED CEMR LAB: IgE (Immunoglobulin E), Serum: 47 intl units/mL (ref 0.0–180.0)

## 2010-04-25 ENCOUNTER — Encounter: Payer: Self-pay | Admitting: Internal Medicine

## 2010-07-05 ENCOUNTER — Ambulatory Visit: Payer: Self-pay | Admitting: Internal Medicine

## 2010-07-05 ENCOUNTER — Telehealth: Payer: Self-pay | Admitting: Internal Medicine

## 2011-01-02 NOTE — Assessment & Plan Note (Signed)
Summary: Pulmonary/ fu ov add pepcid at hs   Copy to:  Dr. Fonnie Jarvis Primary Provider/Referring Provider:  Franchot Heidelberg, MD  CC:  6 wk followup with PFT's.  Pt states that her cough is still bothering her.  She states that cough is mainly dry and hacky but when does produce sputum it is clear.  She states that "throat feels full"- has to clear throat frequently.  Marland Kitchen  History of Present Illness: 59  yowf with bronchiectasis  never smoker but exp to passive smoke by husband with indolent onset doe x around 2006 and gradually worse mostly with housework or talking assoc with sensation of congestion in throat but no excess mucus sleeping or sleep disturbance.   06/21/08 new consult: was concerned that the problem might be her atenolol after what she read on the package insert but was told that she needed to stay on beta-blockers, so changed from  atenolol to bystolic. not able to reproduce any dyspnea or desaturation  walking 3 laps 185 feet each at a normal pace.  July 12, 2008  "not much better at all with breathing/dry cough or swallowing, still  choking on foods. No noct exac. rx reglan started  August 23, 2008 reports doing some better with breathing and talking but not at 100%. No overt heartburn,  just sense of throat congestion  November 08, 2008 ov reporting no worse after stop PPI :  congestion esp after eating,  no early am cough, no hemoptysis,  never produces excess mucus  December 13, 2008 stopped reglan and all PPI and no worse, satisfied zyrtec treats drainage ok and did not take chlortrimeton due to fears of glaucoma.   September 26, 2009 ov  Acute visit.  Pt c/o prod cough with blood streaked sputum x 2 wks.  She states that her breathing is fine.  She c/o "feeling groggy" in her throat.  no epistaxis. rec levaquin  March 03, 2010 Acute visit.  Pt c/o  prod cough with blood tinged sputum since this am with sore throat x 3 days.  She also c/o nasal congestion and PND "for a  long time".   rec add back ppi   Apr 17, 2010 6 wk followup with PFT's.  Pt states that her cough is still bothering her.  She states that cough is mainly dry and hacky but when does produce sputum it is clear.  She states that "throat feels full"- has to clear throat frequently esp after eat.  no nocturnal or am exac.  denies limiting sob, sore throat, dysphagia, itching, sneezing,  nasal congestion or excess secretions,  fever, chills.  Current Medications (verified): 1)  Aspir-Low 81 Mg Tbec (Aspirin) .... Once Daily 2)  Cosopt 2-0.5 % Soln (Dorzolamide-Timolol) .... One Drop Left Eye Two Times A Day 3)  Multivitamins  Tabs (Multiple Vitamin) .... Once Daily 4)  Synthroid 25 Mcg Tabs (Levothyroxine Sodium) .... Once Daily 5)  Trazodone Hcl 50 Mg Tabs (Trazodone Hcl) .... One At Bedtime 6)  Zoloft 100 Mg Tabs (Sertraline Hcl) .... Two At Bedtime 7)  Travatan 0.004 %  Soln (Travoprost) .... As Directed 8)  Alphagan P 0.1 %  Soln (Brimonidine Tartrate) .... As Directed 9)  Caltrate 600+d 600-400 Mg-Unit  Tabs (Calcium Carbonate-Vitamin D) .... Two Times A Day 10)  Alprazolam 1 Mg  Tabs (Alprazolam) .... One At Bedtime If Needed 11)  Bisoprolol Fumarate 5 Mg  Tabs (Bisoprolol Fumarate) .... One Twice Daily 12)  Metamucil 30.9 %  Powd (Psyllium) .... Once Daily 13)  Chlortrimeton .... One Every 4 To 6 Hours Per Dr. Sherene Sires 14)  Pravastatin Sodium 40 Mg Tabs (Pravastatin Sodium) .Marland Kitchen.. 1 Once Daily 15)  Omeprazole 20 Mg  Cpdr (Omeprazole) .... Take  One 30-60 Min Before First Meal of The Day  Allergies (verified): 1)  ! Pcn 2)  ! Sulfa 3)  ! Prednisone (Prednisone)  Past History:  Past Medical History: MITRAL VALVE PROLAPSE (ICD-424.0) ABNORMAL ELECTROCARDIOGRAM (ICD-794.31) MALAISE AND FATIGUE (ICD-780.79) NECK MASS (ICD-784.2)      - Goiter  by bx 10/22/08 (neg cytology) UTI'S, RECURRENT (ICD-599.0) FECAL INCONTINENCE (ICD-787.6) DECREASED HEARING (ICD-389.9) MIGRAINE HEADACHE  (ICD-346.90) GLAUCOMA NEC (ICD-365.89) IBS (ICD-564.1) OSTEOARTHRITIS (ICD-715.90) HYPOTHYROIDISM (ICD-244.9) HYPERTENSION (ICD-401.9) HYPERLIPIDEMIA (ICD-272.4) GERD (ICD-530.81)   - Positive Esophagram 03/03/08 with marked GE reflux and small HH DIVERTICULOSIS, COLON (ICD-562.10) DEPRESSION (ICD-311) ANXIETY (ICD-300.00) ALLERGIC RHINITIS (ICD-477.9) PULMONARY NODULES    - CT CHEST  11/08/08 >  Positive bilateral lower lobe bronchiectasis    - Alpha 1 screen Apr 17, 2010>>>    - Allergy profile sent Apr 17, 2010 >>>  DYSPNEA ON EXERTION [phone])      - PFTs 08/23/08 mild obstruction with ratio 65% no reversibility      - PFT's Apr 17, 2010 FEV1 1.93 (80%)  58,  DLC0 71% improves to 92%  Vital Signs:  Patient profile:   76 year old female Weight:      164 pounds BMI:     25.03 O2 Sat:      96 % on Room air Temp:     97.8 degrees F oral Pulse rate:   66 / minute BP sitting:   120 / 80  (left arm)  Vitals Entered By: Vernie Murders (Apr 17, 2010 9:42 AM)  O2 Flow:  Room air  Physical Exam  Additional Exam:  in general she is a pleasant ambulatroy white female in no acute distress   wt   156  November 08, 2008 >  156 December 13, 2008 > 158 January 31, 2009 > 161 September 26, 2009 > 164 March 03 2010  HEENT: nl dentition, turbinates, and orophanx.  Neck without JVD/Nodes/TM Lungs clear to A and P bilaterally with no cough on exp RRR no s3 or murmur or increase in P2, no edema Abd soft and benign with nl excursion in the supine position. No bruits or organomegaly Ext warm without calf tenderness, cyanosis clubbing      Impression & Recommendations:  Problem # 1:  BRONCHIECTASIS W/ACUTE EXACERBATION (ICD-494.1) Assoc with nodules on CT suggestive of MAI but symptoms are all upper airway at present.  PFT's also show mod obstruction but on basis of absence of limiting sob, am exac of cough or excess thick or purulent mucus no need to rx more aggressively.  Problem # 2:   COUGH (ICD-786.2)  Classic Upper airway cough syndrome, so named because it's frequently impossible to sort out how much is  CR/sinusitis with freq throat clearing (which can be related to primary GERD)   vs  causing  secondary extra esophageal GERD from wide swings in gastric pressure that occur with throat clearing, promoting self use of mint and menthol lozenges that reduce the lower esophageal sphincter tone and exacerbate the problem further These are the same pts who not infrequently have failed to tolerate ace inhibitors,  dry powder inhalers or biphosphonates or report having reflux symptoms that don't respond to standard doses of PPI  Try adding back as needed zyrtec and ppi qam and pepcid at bedtime   Medications Added to Medication List This Visit: 1)  Pepcid Ac Maximum Strength 20 Mg Tabs (Famotidine) .... One at bedtime 2)  Zyrtec Allergy 10 Mg Tabs (Cetirizine hcl) .... By mouth daily as needed for drainage  Other Orders: T-Allergy Profile Region [place], [place], [place], [place], [place] [id number])  Patient Instructions: 1)  Add pepcid 20 mg at bedtime 2)  Change Clortrimeton to Zyrtec 10 mg once daily as needed 3)  Return to office in 3 months, sooner if needed  4)  if your breathing limits you we'll need to consider starting you on breathing medications

## 2011-01-02 NOTE — Letter (Signed)
Summary: Clearance form for [employer]  Clearance form for [employer]   Imported By: Sherian Rein 02/08/2010 12:29:12  _____________________________________________________________________  External Attachment:    Type:   Image     Comment:   External Document

## 2011-01-02 NOTE — Progress Notes (Signed)
Summary: bronchitis  Phone Note Call from Patient   Caller: Patient Call For: wert Summary of Call: congested blood in sputum/ bronchitis [facility] Initial call taken by: Rickard Patience,  March 03, 2010 8:39 AM  Follow-up for Phone Call        called and spoke with pt.  pt scheduled to see MW today at 10:20am.  .Arman Filter LPN  March 03, 1609 9:05 AM

## 2011-01-02 NOTE — Progress Notes (Signed)
Summary: 08/25/07 lab results  Phone Note Outgoing Call   Call placed by: Sonny Dandy,  August 26, 2007 12:28 PM Summary of Call: called, message left for patient to return call .................................................................Marland KitchenMarland KitchenSonny Dandy  August 26, 2007 12:28 PM   Results given to patient, voices understanding .................................................................Marland KitchenMarland KitchenSonny Dandy  August 27, 2007 9:17 AM   Initial call taken by: Sonny Dandy,  August 27, 2007 9:17 AM

## 2011-01-02 NOTE — Assessment & Plan Note (Signed)
Summary: Pulmonary/ acute ov for bronchiectasis   Copy to:  Christy Richardson Christy Richardson/Christy Richardson:  Christy Heidelberg, MD  CC:  Acute visit.  Pt c/o cough and chest congestion x 1 wk- cough is prod with clear to yellow sputum- had specks of blood x 1 day ago.Marland Kitchen  History of Present Illness: 27  yowf with bronchiectasis  never smoker but exp to passive smoke by husband with indolent onset doe x around 2006 and gradually worse mostly with housework or talking assoc with sensation of congestion in throat but no excess mucus sleeping or sleep disturbance.   06/21/08 new consult: was concerned that the problem might be her atenolol after what she read on the package insert but was told that she needed to stay on beta-blockers, so changed from  atenolol to bystolic. not able to reproduce any dyspnea or desaturation  walking 3 laps 185 feet each at a normal pace.  July 12, 2008  "not much better at all with breathing/dry cough or swallowing, still  choking on foods. No noct exac. rx reglan started  August 23, 2008 reports doing some better with breathing and talking but not at 100%. No overt heartburn,  just sense of throat congestion  November 08, 2008 ov reporting no worse after stop PPI :  congestion esp after eating,  no early am cough, no hemoptysis,  never produces excess mucus  December 13, 2008 stopped reglan and all PPI and no worse, satisfied zyrtec treats drainage ok and did not take chlortrimeton due to fears of glaucoma.   September 26, 2009 ov  Acute visit.  Pt c/o prod cough with blood streaked sputum x 2 wks.  She states that her breathing is fine.  She c/o "feeling groggy" in her throat.  no epistaxis. rec levaquin  March 03, 2010 Acute visit.  Pt c/o  prod cough with blood tinged sputum since this am with sore throat x 3 days.  She also c/o nasal congestion and PND "for a long time".   rec add back ppi   Apr 17, 2010 6 wk followup with PFT's.  Pt states that her cough  is still bothering her.  She states that cough is mainly dry and hacky but when does produce sputum it is clear.  She states that "throat feels full"- has to clear throat frequently esp after eat.  no nocturnal or am exac.   rec add pepcid at hs and 1st gen H1 as needed pndsAugust  3, 2011 Acute visit.  Pt c/o cough and chest congestion x 1 wk- cough is prod with clear to yellow sputum- had specks of blood x 1 day ago. prev responded to doxy and wants this instead of fq if possible. Pt denies any significant sore throat, dysphagia, itching, sneezing,  nasal congestion or excess secretions,  fever, chills, sweats, unintended wt loss, pleuritic or exertional cp,  change in activity tolerance  orthopnea pnd or leg swelling   Current Medications (verified): 1)  Aspir-Low 81 Mg Tbec (Aspirin) .... Once Daily 2)  Cosopt 2-0.5 % Soln (Dorzolamide-Timolol) .... One Drop Left Eye Two Times A Day 3)  Multivitamins  Tabs (Multiple Vitamin) .... Once Daily 4)  Synthroid 25 Mcg Tabs (Levothyroxine Sodium) .... Once Daily 5)  Trazodone Hcl 50 Mg Tabs (Trazodone Hcl) .... One At Bedtime 6)  Zoloft 100 Mg Tabs (Sertraline Hcl) .... Two At Bedtime 7)  Travatan 0.004 %  Soln (Travoprost) .... As Directed 8)  Alphagan P 0.1 %  Soln (Brimonidine Tartrate) .... As Directed 9)  Caltrate 600+d 600-400 Mg-Unit  Tabs (Calcium Carbonate-Vitamin D) .... Two Times A Day 10)  Bisoprolol Fumarate 5 Mg  Tabs (Bisoprolol Fumarate) .... One Twice Daily 11)  Metamucil 30.9 %  Powd (Psyllium) .... Once Daily 12)  Pravastatin Sodium 40 Mg Tabs (Pravastatin Sodium) .Marland Kitchen.. 1 Once Daily 13)  Omeprazole 20 Mg  Cpdr (Omeprazole) .... Take  One 30-60 Min Before First Meal of The Day 14)  Pepcid Ac Maximum Strength 20 Mg Tabs (Famotidine) .... One At Bedtime 15)  Alprazolam 1 Mg  Tabs (Alprazolam) .... One At Bedtime If Needed 16)  Zyrtec Allergy 10 Mg  Tabs (Cetirizine Hcl) .... By Mouth Daily As Needed For Drainage  Allergies  (verified): 1)  ! Pcn 2)  ! Sulfa 3)  ! Prednisone (Prednisone)  Vital Signs:  Patient profile:   77 year old female Weight:      162 pounds O2 Sat:      93 % on Room air Temp:     98.5 degrees F oral Pulse rate:   65 / minute BP sitting:   112 / 78  (left arm)  Vitals Entered By: Vernie Murders (July 05, 2010 11:57 AM)  O2 Flow:  Room air  Physical Exam  Additional Exam:  in general she is a pleasant ambulatroy white female in no acute distress   wt   156  November 08, 2008 >   158 January 31, 2009 > 161 September 26, 2009 > 164 March 03 2010 > 162 July 05, 2010  HEENT: nl dentition, turbinates, and orophanx.  Neck without JVD/Nodes/TM Lungs clear to A and P bilaterally with no cough on exp RRR no s3 or murmur or increase in P2, no edema Abd soft and benign with nl excursion in the supine position. No bruits or organomegaly Ext warm without calf tenderness, cyanosis clubbing      CXR  Procedure date:  07/05/2010  Findings:       .Comparison: Chest x-ray 09/26/2009.   Findings: The cardiac silhouette, mediastinal and hilar contours are within normal limits and stable.  The lungs are clear acute process.  Chronic changes of COPD.  A small bilateral nodules are again demonstrated.  No pleural effusion or pneumothorax.  The bony thorax is intact.   IMPRESSION: Chronic lung changes/COPD with stable nodules. No acute pulmonary findings.    Impression & Recommendations:  Problem # 1:  BRONCHIECTASIS W/ACUTE EXACERBATION (ICD-494.1)  Assoc with nodules on CT suggestive of MAI but symptoms are all upper airway at present.  PFT's also show mod obstruction but on basis of absence of limiting sob, am exac of cough or excess thick or purulent mucus no need to rx more aggressively. If doxy changes mucus back to clear it's fine to use cyclically   Each maintenance medication was reviewed in detail including most importantly the difference between maintenance and as needed  and under what circumstances the prns are to be used. See instructions for specific recommendations   Medications Added to Medication List This Visit: 1)  Doxycycline Hyclate 100 Mg Caps (Doxycycline hyclate) .... One twice daily x 10 days before meals with glass of water  Other Orders: T-2 View CXR (71020TC) Est. Patient Level III (16109)  Patient Instructions: 1)  Doxycycline 100 mg twice daily x 10 days as needed for change in mucus 2)  Try citrucel in place of metamucil 3)  Return to office in  6 months, sooner if needed  Prescriptions: DOXYCYCLINE HYCLATE 100 MG CAPS (DOXYCYCLINE HYCLATE) one twice daily x 10 days before meals with glass of water  #20 x 11   Entered and Authorized by:   Nyoka Cowden MD   Signed by:   Zackery Barefoot CMA on 07/05/2010   Method used:   Electronically to        [facility] (retail)       [address]       [place], [place]  [address]       Ph: [phone]       Fax: [phone]   RxID:   [id number]

## 2011-01-02 NOTE — Miscellaneous (Signed)
Summary: Orders Update pft charges  Clinical Lists Changes  Orders: Added new Service order of Carbon Monoxide diffusing w/capacity (94720) - Signed Added new Service order of Lung Volumes (94240) - Signed Added new Service order of Spirometry (Pre & Post) (94060) - Signed 

## 2011-01-02 NOTE — Assessment & Plan Note (Signed)
Summary: Pulmonary/ aebronchiectais, try doxy   Copy to:  Dr. Fonnie Jarvis Primary Provider/Referring Provider:  Franchot Heidelberg, MD  CC:  Acute visit.  Pt c/p prod cough with blood tinged sputum since this am.  She states that she has had sore throat x 3 days.  She also c/o nasal congestion and PND "for a long time".  .  History of Present Illness: 40 yowf with bronchiectasis  never smoker but exp to passive smoke by husband with indolent onset doe x around 2006 and gradually worse mostly with housework or talking assoc with sensation of congestion in throat but no excess mucus sleeping or sleep disturbance.   06/21/08 new consult: was concerned that the problem might be her atenolol after what she read on the package insert but was told that she needed to stay on beta-blockers, so changed from  atenolol to bystolic. not able to reproduce any dyspnea or desaturation  walking 3 laps 185 feet each at a normal pace.  July 12, 2008  "not much better at all with breathing/dry cough or swallowing, still  choking on foods. No noct exac. rx reglan started  August 23, 2008 reports doing some better with breathing and talking but not at 100%. No overt heartburn,  just sense of throat congestion  November 08, 2008 ov reporting no worse after stop PPI :  congestion esp after eating,  no early am cough, no hemoptysis,  never produces excess mucus  December 13, 2008 stopped reglan and all PPI and no worse, satisfied zyrtec treats drainage ok and did not take chlortrimeton due to fears of glaucoma.   September 26, 2009 ov  Acute visit.  Pt c/o prod cough with blood streaked sputum x 2 wks.  She states that her breathing is fine.  She c/o "feeling groggy" in her throat.  no epistaxis. rec levaquin  March 03, 2010 Acute visit.  Pt c/o  prod cough with blood tinged sputum since this am with sore throat x 3 days.  She also c/o nasal congestion and PND "for a long time".   Pt denies any significant ,dysphagia,  itching, sneezing,   fever, chills, sweats, unintended wt loss, pleuritic or exertional cp, hempoptysis, change in activity tolerance  orthopnea pnd or leg swelling . Pt also denies any obvious fluctuation in symptoms with weather or environmental change or other alleviating or aggravating factors.        Current Medications (verified): 1)  Aspir-Low 81 Mg Tbec (Aspirin) .... Once Daily 2)  Cosopt 2-0.5 % Soln (Dorzolamide-Timolol) .... One Drop Left Eye Two Times A Day 3)  Multivitamins  Tabs (Multiple Vitamin) .... Once Daily 4)  Synthroid 25 Mcg Tabs (Levothyroxine Sodium) .... Once Daily 5)  Trazodone Hcl 50 Mg Tabs (Trazodone Hcl) .... One At Bedtime 6)  Zoloft 100 Mg Tabs (Sertraline Hcl) .... Two At Bedtime 7)  Travatan 0.004 %  Soln (Travoprost) .... As Directed 8)  Alphagan P 0.1 %  Soln (Brimonidine Tartrate) .... As Directed 9)  Caltrate 600+d 600-400 Mg-Unit  Tabs (Calcium Carbonate-Vitamin D) .... Two Times A Day 10)  Alprazolam 1 Mg  Tabs (Alprazolam) .... One At Bedtime If Needed 11)  Bisoprolol Fumarate 5 Mg  Tabs (Bisoprolol Fumarate) .... One Twice Daily 12)  Metamucil 30.9 %  Powd (Psyllium) .... Once Daily 13)  Chlortrimeton .... One Every 4 To 6 Hours Per Dr. Sherene Sires 14)  Pravastatin Sodium 40 Mg Tabs (Pravastatin Sodium) .Marland Kitchen.. 1 Once Daily  Allergies (verified):  1)  ! Pcn 2)  ! Sulfa 3)  ! Prednisone (Prednisone)  Past History:  Past Medical History:  DYSPNEA ON EXERTION (ICD-786.09)      - PFTs 08/23/08 mild obstruction with ratio 65% no reversibility MITRAL VALVE PROLAPSE (ICD-424.0) ABNORMAL ELECTROCARDIOGRAM (ICD-794.31) MALAISE AND FATIGUE (ICD-780.79) NECK MASS (ICD-784.2)      - Goiter  by bx 10/22/08 (neg cytology) UTI'S, RECURRENT (ICD-599.0) FECAL INCONTINENCE (ICD-787.6) DECREASED HEARING (ICD-389.9) MIGRAINE HEADACHE (ICD-346.90) GLAUCOMA NEC (ICD-365.89) IBS (ICD-564.1) OSTEOARTHRITIS (ICD-715.90) HYPOTHYROIDISM (ICD-244.9) HYPERTENSION  (ICD-401.9) HYPERLIPIDEMIA (ICD-272.4) GERD (ICD-530.81)   - Positive Esophagram 03/03/08 with marked GE reflux and small HH DIVERTICULOSIS, COLON (ICD-562.10) DEPRESSION (ICD-311) ANXIETY (ICD-300.00) ALLERGIC RHINITIS (ICD-477.9) PULMONARY NODULES    - CT CHEST  11/08/08 >  Positive bilateral lower lobe bronchiectasis  Vital Signs:  Patient profile:   76 year old female Weight:      164 pounds O2 Sat:      97 % on Room air Temp:     97.6 degrees F oral Pulse rate:   89 / minute BP sitting:   116 / 78  (left arm)  Vitals Entered By: Vernie Murders (March 03, 2010 10:33 AM)  O2 Flow:  Room air  Physical Exam  Additional Exam:   in general she is a pleasant elderly white female in no acute distress   wt   156  November 08, 2008 >  156 December 13, 2008 > 158 January 31, 2009 > 161 September 26, 2009 > 164 March 03 2010  HEENT: nl dentition, turbinates, and orophanx.  Neck without JVD/Nodes/TM Lungs clear to A and P bilaterally with no cough on exp RRR no s3 or murmur or increase in P2, no edema Abd soft and benign with nl excursion in the supine position. No bruits or organomegaly Ext warm without calf tenderness, cyanosis clubbing      Impression & Recommendations:  Problem # 1:  BRONCHIECTASIS W/ACUTE EXACERBATION (ICD-494.1) Assoc with mild airflow obst previously and intermittent hemoptysis controlled with levaquin, would like to try something cheaper so rx with doxy and back up levaquin   Each maintenance medication was reviewed in detail including most importantly the difference between maintenance and as needed and under what circumstances the prns are to be used. See instructions for specific recommendations   Medications Added to Medication List This Visit: 1)  Pravastatin Sodium 40 Mg Tabs (Pravastatin sodium) .Marland Kitchen.. 1 once daily 2)  Omeprazole 20 Mg Cpdr (Omeprazole) .... Take  one 30-60 min before first meal of the day 3)  Doxycycline Hyclate 100 Mg Caps (Doxycycline  hyclate) .... One twice daily before meals x 10 days 4)  Levaquin 750 Mg Tabs (Levofloxacin) .... One tablet by mouth daily  Other Orders: Est. Patient Level III (29562)  Patient Instructions: 1)  Doxy x 10 days should take care of the discolored mucus and if not change to Levaquin x 5 days 2)  Prilosec 20 mg Take  one 30-60 min before first meal of the day  3)  GERD (REFLUX)  is a common cause of respiratory symptoms. It commonly presents without heartburn and can be treated with medication, but also with lifestyle changes including avoidance of late meals, excessive alcohol, smoking cessation, and avoid fatty foods, chocolate, peppermint, colas, red wine, and acidic juices such as orange juice. NO MINT OR MENTHOL PRODUCTS SO NO COUGH DROPS  4)  USE SUGARLESS CANDY INSTEAD (jolley ranchers)  5)  NO OIL BASED VITAMINS  6)  Please schedule a follow-up appointment in 6 weeks, sooner if needed with PFT's on return 7)    Prescriptions: LEVAQUIN 750 MG  TABS (LEVOFLOXACIN) One tablet by mouth daily  #5 x 11   Entered and Authorized by:   Nyoka Cowden MD   Signed by:   Nyoka Cowden MD on 03/03/2010   Method used:   Print then Give to Patient   RxID:   [id number] DOXYCYCLINE HYCLATE 100 MG CAPS (DOXYCYCLINE HYCLATE) one twice daily before meals x 10 days  #20 x 0   Entered and Authorized by:   Nyoka Cowden MD   Signed by:   Nyoka Cowden MD on 03/03/2010   Method used:   Electronically to        [facility] (retail)       [address]       [place], [place]  [address]       Ph: [phone]       Fax: [phone]   RxID:   [id number]

## 2011-01-02 NOTE — Progress Notes (Signed)
Summary: rx request/ cough/ congestion  Phone Note Call from Patient Call back at Home Phone [phone]   Caller: Patient Call For: wert Summary of Call: pt c/o congestion w/ cough/ brownish phlegm with "very slight amount of blood off and on" x 2 wks. no N or V. denies fever. requests rx for DOXYCYCLINE as pt says dr wert have prescribed this for pt in the past and it helps clear this up. [facility] in [place] [phone].  Initial call taken by: Tivis Ringer, CNA,  July 05, 2010 9:31 AM  Follow-up for Phone Call        scheduled pt for appt today with MW @ 12 noon. Zackery Barefoot CMA  July 05, 2010 9:40 AM

## 2011-01-09 ENCOUNTER — Other Ambulatory Visit: Payer: Self-pay

## 2011-01-09 DIAGNOSIS — E041 Nontoxic single thyroid nodule: Secondary | ICD-10-CM

## 2011-01-12 ENCOUNTER — Ambulatory Visit (HOSPITAL_COMMUNITY)
Admission: RE | Admit: 2011-01-12 | Discharge: 2011-01-12 | Disposition: A | Discharge status: Home / Self Care | Payer: MEDICARE | Source: Ambulatory Visit | Attending: Family Medicine | Admitting: Family Medicine

## 2011-01-12 DIAGNOSIS — E041 Nontoxic single thyroid nodule: Secondary | ICD-10-CM

## 2011-01-12 DIAGNOSIS — E042 Nontoxic multinodular goiter: Secondary | ICD-10-CM | POA: Insufficient documentation

## 2011-01-19 ENCOUNTER — Ambulatory Visit (INDEPENDENT_AMBULATORY_CARE_PROVIDER_SITE_OTHER)
Admission: RE | Admit: 2011-01-19 | Discharge: 2011-01-19 | Disposition: A | Discharge status: Home / Self Care | Payer: MEDICARE | Source: Ambulatory Visit | Attending: Internal Medicine | Admitting: Internal Medicine

## 2011-01-19 ENCOUNTER — Encounter: Payer: Self-pay | Admitting: Internal Medicine

## 2011-01-19 ENCOUNTER — Other Ambulatory Visit: Payer: Self-pay | Admitting: Internal Medicine

## 2011-01-19 ENCOUNTER — Ambulatory Visit (INDEPENDENT_AMBULATORY_CARE_PROVIDER_SITE_OTHER): Payer: MEDICARE | Admitting: Internal Medicine

## 2011-01-19 DIAGNOSIS — J471 Bronchiectasis with (acute) exacerbation: Secondary | ICD-10-CM

## 2011-01-24 NOTE — Assessment & Plan Note (Signed)
Summary: Pulmonary/ ext ov for bronchiectasis flare with hfa 74%   Copy to:  Dr. Fonnie Jarvis Primary Provider/Referring Provider:  Franchot Heidelberg, MD  CC:  76 mth rov - f/u bronchiectasis - Dx with Pneumonia in [place] last month - Seen at urgent care - Took 10 days of Levaquin - Occas hacky cough after eating - Chest congstion - OCcas prod (Occas blood and slight green) - Sob with activity - Pt has ?'s about Pneumonax (Last doucmented 2004).  History of Present Illness: 76 yowf with bronchiectasis/GERD   never smoker but exp to passive smoke by husband with indolent onset doe x around 2006 and gradually worse mostly with housework or talking assoc with sensation of congestion in throat but no excess mucus sleeping or sleep disturbance.   06/21/08 new consult: was concerned that the problem might be her atenolol after what she read on the package insert but was told that she needed to stay on beta-blockers, so changed from  atenolol to bystolic. not able to reproduce any dyspnea or desaturation  walking 3 laps 185 feet each at a normal pace.  July 12, 2008  "not much better at all with breathing/dry cough or swallowing, still  choking on foods. No noct exac. rx reglan started  August 23, 2008 reports doing some better with breathing and talking but not at 100%. No overt heartburn,  just sense of throat congestion  November 08, 2008 ov reporting no worse after stop PPI :  congestion esp after eating,  no early am cough, no hemoptysis,  never produces excess mucus  December 13, 2008 stopped reglan and all PPI and no worse, satisfied zyrtec treats drainage ok and did not take chlortrimeton due to fears of glaucoma.   September 26, 2009 ov  Acute visit.  Pt c/o prod cough with blood streaked sputum x 2 wks.  She states that her breathing is fine.  She c/o "feeling groggy" in her throat.  no epistaxis. rec levaquin  March 03, 2010 Acute visit.  Pt c/o  prod cough with blood tinged sputum since this am  with sore throat x 3 days.  She also c/o nasal congestion and PND "for a long time".   rec add back ppi   Apr 17, 2010 6 wk followup with PFT's.  Pt states that her cough is still bothering her.  She states that cough is mainly dry and hacky but when does produce sputum it is clear.  She states that "throat feels full"- has to clear throat frequently esp after eat.  no nocturnal or am exac.   rec add pepcid at hs and 1st gen H1 as needed pndsAugust  3, 2011 Acute visit.  Pt c/o cough and chest congestion x 1 wk- cough is prod with clear to yellow sputum- had specks of blood x 1 day ago. prev responded to doxy and wants this instead of if  possible. Doxycycline 100 mg twice daily x 10 days as needed for change in mucus Try citrucel in place of metamucil  January 19, 2011 ov cc baseline sob and wheezy but does not prevent activities desired as long as takes time, also slowed by hips, then abruptly worse in January with cough with hemoptyis and stuffy nose and gen tightness  > UC and took levaquin now back to baseline, no purulent sputum, no hemoptysis.  Pt denies any significant sore throat, dysphagia, itching, sneezing,  nasal congestion or excess secretions,  fever, chills, sweats, unintended wt loss, pleuritic  or exertional cp, hempoptysis,  orthopnea pnd or leg swelling. Pt also denies any obvious fluctuation in symptoms with weather or environmental change or other alleviating or aggravating factors.       Current Medications (verified): 1)  Aspir-Low 81 Mg Tbec (Aspirin) .... Once Daily 2)  Cosopt 2-0.5 % Soln (Dorzolamide-Timolol) .... One Drop Left Eye Two Times A Day 3)  Multivitamins  Tabs (Multiple Vitamin) .... Once Daily 4)  Synthroid 25 Mcg Tabs (Levothyroxine Sodium) .... Once Daily 5)  Trazodone Hcl 50 Mg Tabs (Trazodone Hcl) .... One At Bedtime 6)  Zoloft 100 Mg Tabs (Sertraline Hcl) .... Two At Bedtime 7)  Travatan 0.004 %  Soln (Travoprost) .... As Directed 8)  Alphagan P 0.1 %   Soln (Brimonidine Tartrate) .... As Directed 9)  Caltrate 600+d 600-400 Mg-Unit  Tabs (Calcium Carbonate-Vitamin D) .... Two Times A Day 10)  Bisoprolol Fumarate 5 Mg  Tabs (Bisoprolol Fumarate) .... One Twice Daily 11)  Metamucil 30.9 %  Powd (Psyllium) .... Once Daily 12)  Pravastatin Sodium 40 Mg Tabs (Pravastatin Sodium) .Marland Kitchen.. 1 Once Daily 13)  Omeprazole 20 Mg  Cpdr (Omeprazole) .... Take  One 30-60 Min Before First Meal of The Day 14)  Pepcid Ac Maximum Strength 20 Mg Tabs (Famotidine) .... One At Bedtime 15)  Alprazolam 1 Mg  Tabs (Alprazolam) .... One At Bedtime If Needed 16)  Zyrtec Allergy 10 Mg  Tabs (Cetirizine Hcl) .... By Mouth Daily As Needed For Drainage 17)  Doxycycline Hyclate 100 Mg Caps (Doxycycline Hyclate) .... One Twice Daily X 10 Days Before Meals With Glass of Water  Allergies (verified): 1)  ! Pcn 2)  ! Sulfa 3)  ! Prednisone (Prednisone)  Comments:  Nurse/Medical Assistant: The patient's medications and allergies were reviewed with the patient and were updated in the Medication and Allergy Lists.  Past History:  Past Medical History: MITRAL VALVE PROLAPSE (ICD-424.0) ABNORMAL ELECTROCARDIOGRAM (ICD-794.31) MALAISE AND FATIGUE (ICD-780.79) NECK MASS (ICD-784.2)      - Goiter  by bx 10/22/08 (neg cytology) UTI'S, RECURRENT (ICD-599.0) FECAL INCONTINENCE (ICD-787.6) DECREASED HEARING (ICD-389.9) MIGRAINE HEADACHE (ICD-346.90) GLAUCOMA NEC (ICD-365.89) IBS (ICD-564.1) OSTEOARTHRITIS (ICD-715.90) HYPOTHYROIDISM (ICD-244.9) HYPERTENSION (ICD-401.9) HYPERLIPIDEMIA (ICD-272.4) GERD (ICD-530.81)   - Positive Esophagram 03/03/08 with marked GE reflux and small HH DIVERTICULOSIS, COLON (ICD-562.10) DEPRESSION (ICD-311) ANXIETY (ICD-300.00) ALLERGIC RHINITIS (ICD-477.9) PULMONARY NODULES    - CT CHEST  11/08/08 >  Positive bilateral lower lobe bronchiectasis    - Alpha 1 screen Apr 17, 2010>>>  neg but no genotype > repeat January 19, 2011     - Allergy  profile sent Apr 17, 2010 >>> IgE 47  DYSPNEA ON EXERTION (ICD-786.09)      - PFTs 08/23/08 mild obstruction with ratio 65% no reversibility      - PFT's Apr 17, 2010 FEV1 1.93 (80%)  58,  DLC0 71% improves to 92%      - [facility]  January 19, 2011 75% p coaching  Vital Signs:  Patient profile:   76 year old female Height:      68 inches Weight:      166.13 pounds BMI:     25.35 O2 Sat:      97 % on Room air Temp:     97.6 degrees F oral Pulse rate:   68 / minute BP sitting:   138 / 72  (left arm) Cuff size:   regular  Vitals Entered By: Abigail Miyamoto RN (January 19, 2011 11:18 AM)  O2 Flow:  Room air  Physical Exam  Additional Exam:  in general she is a pleasant ambulatroy white female in no acute distress   wt   156  November 08, 2008 >  164 March 03 2010 > 162 July 05, 2010 > 166 January 19, 2011  HEENT: nl dentition, turbinates, and orophanx.  Neck without JVD/Nodes/TM Lungs trace bilateral end end exp wheeze with fvc maneuver RRR no s3 or murmur or increase in P2, no edema Abd soft and benign with nl excursion in the supine position. No bruits or organomegaly Ext warm without calf tenderness, cyanosis clubbing      CXR  Procedure date:  01/19/2011  Findings:      Comparison: 07/05/2010   Findings: Lungs remain markedly hyperaerated.  Stable bronchitic changes.  No new consolidation or mass.  Left hemidiaphragm remains flattened.  Normal heart size.  Pulmonary vascularity is within normal limits.   IMPRESSION: Chronic changes related to COPD.  No acute cardiopulmonary disease  Impression & Recommendations:  Problem # 1:  BRONCHIECTASIS W/ACUTE EXACERBATION (ICD-494.1) Resolved but baseline sob and cough so may benefit from maint rx with dulera and GERD diet along with continue PPI rx  I spent extra time with the patient today explaining optimal mdi  technique.  This improved from  25-75%   Each maintenance medication was reviewed in detail including most  importantly the difference between maintenance and as needed and under what circumstances the prns are to be used. See instructions for specific recommendations   Problem # 2:  HEMOPTYSIS (ICD-786.3)  Her updated medication list for this problem includes:    Multivitamins Tabs (Multiple vitamin) ..... Once daily    Caltrate 600+d 600-400 Mg-unit Tabs (Calcium carbonate-vitamin d) .Marland Kitchen..Marland Kitchen Two times a day    Doxycycline Hyclate 100 Mg Caps (Doxycycline hyclate) ..... One twice daily x 10 days before meals with glass of water    Dulera 100-5 Mcg/act Aero (Mometasone furo-formoterol fum) .Marland Kitchen... 2 puffs first thing  in am and 2 puffs again in pm about 12 hours later  I had an extended discussion with the patient today lasting 15 to 20 minutes of a 25 minute visit on the following issues:  Natural hx of bronchiectasis reviewed as well as treatment options   Medications Added to Medication List This Visit: 1)  Dulera 100-5 Mcg/act Aero (Mometasone furo-formoterol fum) .... 2 puffs first thing  in am and 2 puffs again in pm about 12 hours later  Other Orders: Est. Patient Level IV (04540) HFA Instruction (98119) T-2 View CXR (71020TC)  Patient Instructions: 1)  GERD (REFLUX)  is a common cause of respiratory symptoms. It commonly presents without heartburn and can be treated with medication, but also with lifestyle changes including avoidance of late meals, excessive alcohol, smoking cessation, and avoid fatty foods, chocolate, peppermint, colas, red wine, and acidic juices such as orange juice. NO MINT OR MENTHOL PRODUCTS SO NO COUGH DROPS  2)  USE SUGARLESS CANDY INSTEAD (jolley ranchers)  3)  NO OIL BASED VITAMINS  4)  Dulera 100 2 puffs every 12 hours if needed 5)  Work on inhaler technique:  relax and blow all the way out then take a nice smooth deep breath back in, triggering the inhaler at same time you start breathing in  6)  Please schedule a follow-up appointment in 6 weeks, sooner if  needed

## 2011-02-27 ENCOUNTER — Encounter: Payer: Self-pay | Admitting: Internal Medicine

## 2011-03-02 ENCOUNTER — Ambulatory Visit: Payer: MEDICARE | Admitting: Internal Medicine

## 2011-03-13 LAB — BASIC METABOLIC PANEL
BUN: 3 mg/dL — ABNORMAL LOW (ref 6–23)
CO2: 25 mEq/L (ref 19–32)
CO2: 29 mEq/L (ref 19–32)
Calcium: 8.1 mg/dL — ABNORMAL LOW (ref 8.4–10.5)
Chloride: 102 mEq/L (ref 96–112)
Chloride: 103 mEq/L (ref 96–112)
Creatinine, Ser: 0.83 mg/dL (ref 0.4–1.2)
GFR calc Af Amer: >60 mL/min (ref >60)
Sodium: 132 mEq/L — ABNORMAL LOW (ref 135–145)

## 2011-03-13 LAB — COMPREHENSIVE METABOLIC PANEL
ALT: 14 U/L (ref 0–35)
Calcium: 10 mg/dL (ref 8.4–10.5)
Creatinine, Ser: 0.96 mg/dL (ref 0.4–1.2)
GFR calc Af Amer: >60 mL/min (ref >60)
Glucose, Bld: 101 mg/dL — ABNORMAL HIGH (ref 70–99)
Sodium: 140 mEq/L (ref 135–145)
Total Protein: 6.7 g/dL (ref 6.0–8.3)

## 2011-03-13 LAB — CBC
HCT: 29.3 % — ABNORMAL LOW (ref 36.0–46.0)
Hemoglobin: 15.3 g/dL — ABNORMAL HIGH (ref 12.0–15.0)
MCHC: 34.8 g/dL (ref 30.0–36.0)
MCHC: 34.8 g/dL (ref 30.0–36.0)
MCV: 88.8 fL (ref 78.0–100.0)
MCV: 89.6 fL (ref 78.0–100.0)
Platelets: 157 10*3/uL (ref 150–400)
RBC: 3.3 MIL/uL — ABNORMAL LOW (ref 3.87–5.11)
RDW: 13.1 % (ref 11.5–15.5)
WBC: 11.1 10*3/uL — ABNORMAL HIGH (ref 4.0–10.5)

## 2011-03-13 LAB — PROTIME-INR
INR: 1 (ref 0.00–1.49)
Prothrombin Time: 26 seconds — ABNORMAL HIGH (ref 11.6–15.2)
Prothrombin Time: 13.7 seconds (ref 11.6–15.2)

## 2011-03-13 LAB — URINALYSIS, ROUTINE W REFLEX MICROSCOPIC
Glucose, UA: NEGATIVE mg/dL
Nitrite: NEGATIVE
Protein, ur: NEGATIVE mg/dL
Urobilinogen, UA: 0.2 mg/dL (ref 0.0–1.0)

## 2011-03-13 LAB — TYPE AND SCREEN: ABO/RH(D): O POS

## 2011-03-13 LAB — APTT: aPTT: 40 seconds — ABNORMAL HIGH (ref 24–37)

## 2011-03-15 ENCOUNTER — Encounter: Payer: Self-pay | Admitting: Internal Medicine

## 2011-03-15 ENCOUNTER — Ambulatory Visit (INDEPENDENT_AMBULATORY_CARE_PROVIDER_SITE_OTHER): Payer: MEDICARE | Admitting: Internal Medicine

## 2011-03-15 VITALS — BP 150/90 | HR 86 | Temp 98.2°F | Ht 68.0 in | Wt 163.4 lb

## 2011-03-15 DIAGNOSIS — K219 Gastro-esophageal reflux disease without esophagitis: Secondary | ICD-10-CM

## 2011-03-15 DIAGNOSIS — J471 Bronchiectasis with (acute) exacerbation: Secondary | ICD-10-CM

## 2011-03-15 DIAGNOSIS — R05 Cough: Secondary | ICD-10-CM

## 2011-03-15 DIAGNOSIS — H4089 Other specified glaucoma: Secondary | ICD-10-CM

## 2011-03-15 NOTE — Patient Instructions (Addendum)
Dulera 100 Take 2 puffs first thing in am and then another 2 puffs about 12 hours later.  Work on inhaler technique:  relax and gently blow all the way out then take a nice smooth deep breath back in, triggering the inhaler at same time you start breathing in.  Hold for up to 5 seconds if you can.  Rinse and gargle with water when done   If your mouth or throat starts to bother you,   I suggest you time the inhaler to your dental care and after using the inhaler(s) brush teeth and tongue with a baking soda containing toothpaste and when you rinse this out, gargle with it first to see if this helps your mouth and throat.     When mucus gets nasty in color go ahead and take doxycyline and you should expect it turn less nasty  When cough/ congestion the maximum dose is 1200mg  every 12 hours and use your flutter valve   Continue pepcid 20 mg one at bedtime automatically  See Tammy NP w/in 2 weeks with your daughter  all your medications, even over the counter meds, separated in two separate bags, the ones you take no matter what vs the ones you stop once you feel better and take only as needed when you feel you need them.   Tammy  will generate for you a new user friendly medication calendar that will put [place] all on the same page re: your medication use.     I will Dr Elmer Picker a copy of this note to inquire about getting a substitute for your timolol eye drops which are expensive may be interfering with your lungs  Without this process, it simply isn't possible to assure that we are providing  your outpatient care  with  the attention to detail we feel you deserve.   If we cannot assure that you're getting that kind of care,  then we cannot manage your problem effectively from this clinic.  Once you have seen Tammy and we are sure that we're all on the same page with your medication use she will arrange follow up with me.   If you cannot master the use of the inhaler Tammy will arrange for you to get  the same medication in a nebulizer

## 2011-03-15 NOTE — Assessment & Plan Note (Signed)
Given that she has such poor hfa technique but feels the saba may help really should use a substitute for timolol if feasible to avoid B2 spillover.  Will send Dr Elmer Picker a copy of this note

## 2011-03-15 NOTE — Progress Notes (Signed)
Subjective:    Patient ID: Christy Richardson, female    DOB: 11-07-1935, 76 y.o.   MRN: [id number]  HPI   Primary svc/   Tapper/ PA Kristian Covey    53 yowf never smoker but exp to passive smoke by husband with bronchiectasis/GERD  with indolent onset doe x around 2006 and gradually worse mostly with housework or talking assoc with sensation of congestion in throat but no excess mucus sleeping or sleep disturbance.   06/21/08 new consult: was concerned that the problem might be her atenolol after what she read on the package insert but was told that she needed to stay on beta-blockers, so changed from atenolol to bystolic. not able to reproduce any dyspnea or desaturation walking 3 laps 185 feet each at a normal pace.   July 12, 2008 "not much better at all with breathing/dry cough or swallowing, still choking on foods. No noct exac. rx reglan started  August 23, 2008 reports doing some better with breathing and talking but not at 100%. No overt heartburn, just sense of throat congestion   November 08, 2008 ov reporting no worse after stop PPI : congestion esp after eating, no early am cough, no hemoptysis, never produces excess mucus   December 13, 2008 stopped reglan and all PPI and no worse, satisfied zyrtec treats drainage ok and did not take chlortrimeton due to fears of glaucoma.   September 26, 2009 ov Acute visit. Pt c/o prod cough with blood streaked sputum x 2 wks. She states that her breathing is fine. She c/o "feeling groggy" in her throat. no epistaxis. rec levaquin   March 03, 2010 Acute visit. Pt c/o prod cough with blood tinged sputum since this am with sore throat x 3 days. She also c/o nasal congestion and PND "for a long time". rec add back ppi   Apr 17, 2010 6 wk followup with PFT's. Pt states that her cough is still bothering her. She states that cough is mainly dry and hacky but when does produce sputum it is clear. She states that "throat feels full"- has to clear throat  frequently esp after eat. no nocturnal or am exac. rec add pepcid at hs and 1st gen H1 as needed  For pnds   January 19, 2011 ov cc baseline sob and wheezy but does not prevent activities desired as long as takes time, also slowed by hips, then abruptly worse in January with cough with hemoptyis and stuffy nose and gen tightness > UC and took levaquin now back to baseline, no purulent sputum, no hemoptysis. rec  GERD (REFLUX)  Diet and trial of  Dulera 100 2 puffs every 12 hours if needed   Work on inhaler technique:     03/15/2011 ov c/o cough congestion no abx for over a month.  Last round doxycyline changed mucus to clear but anxious that she's going to have another flare and won't be able to keep taking abx.  Pt denies any significant sore throat, dysphagia, itching, sneezing,  nasal congestion or excess/ purulent secretions,  fever, chills, sweats, unintended wt loss, pleuritic or exertional cp, hempoptysis, orthopnea pnd or leg swelling.    Also denies any obvious fluctuation of symptoms with weather or environmental changes or other aggravating or alleviating factors.         :  Past Medical History:  MITRAL VALVE PROLAPSE (ICD-424.0)  ABNORMAL ELECTROCARDIOGRAM (ICD-794.31)  MALAISE AND FATIGUE (ICD-780.79)  NECK MASS (ICD-784.2)  - Goiter by bx 10/22/08 (neg cytology)  UTI'S, RECURRENT (ICD-599.0)  FECAL INCONTINENCE (ICD-787.6)  DECREASED HEARING (ICD-389.9)  MIGRAINE HEADACHE (ICD-346.90)  GLAUCOMA NEC (ICD-365.89)  IBS (ICD-564.1)  OSTEOARTHRITIS (ICD-715.90)  HYPOTHYROIDISM (ICD-244.9)  HYPERTENSION (ICD-401.9)  HYPERLIPIDEMIA (ICD-272.4)  GERD (ICD-530.81)  - Positive Esophagram 03/03/08 with marked GE reflux and small HH  DIVERTICULOSIS, COLON (ICD-562.10)  DEPRESSION (ICD-311)  ANXIETY (ICD-300.00)  ALLERGIC RHINITIS (ICD-477.9)  PULMONARY NODULES  - CT CHEST 11/08/08 > Positive bilateral lower lobe bronchiectasis  - Alpha 1 screen Apr 17, 2010>>> neg but no  genotype > repeat January 19, 2011=  MM - Allergy profile sent Apr 17, 2010 >>> IgE 47 with grass> mold pos DYSPNEA ON EXERTION (ICD-786.09)  - PFTs 08/23/08 mild obstruction with ratio 65% no reversibility  - PFT's Apr 17, 2010 FEV1 1.93 (80%)  ratio58, DLC0 71% improves to 92%  - [facility] January 19, 2011 75% p coaching > 25 % 03/15/2011         Review of Systems     Objective:   Physical Exam Anxious to point of being agitated wf nad.  wt 156 November 08, 2008 > 164 March 03 2010 > 162 July 05, 2010 >   Wt 163 03/15/2011  HEENT mild turbinate edema.  Oropharynx no thrush or excess pnd or cobblestoning.  No JVD or cervical adenopathy. Mild accessory muscle hypertrophy. Trachea midline, nl thryroid. Chest was hyperinflated by percussion with diminished breath sounds and moderate increased exp time without wheeze. Hoover sign positive at mid inspiration. Regular rate and rhythm without murmur gallop or rub or increase P2 or edema.  Abd: no hsm, nl excursion. Ext warm without cyanosis or clubbing.       cxr 01/19/11 Chronic changes related to COPD. No acute cardiopulmonary disease.   Assessment & Plan:

## 2011-03-15 NOTE — Assessment & Plan Note (Signed)
I had an extended discussion with the patient today lasting 15 to 20 minutes of a 25 minute visit on the following issues:  Reviewed mucociliary dysfunction concept again with emphasis on approp prn use of abx and mucolytics.  Added flutter valve  The proper method of use, as well as anticipated side effects, of this metered-dose inhaler are discussed and demonstrated to the patient. Only able to use this at  < 25% effective even after coaching and became more frustrated the more she tried it.  May need to consider perforomist and budesonide on next eval    Each maintenance medication was reviewed in detail including most importantly the difference between maintenance and as needed and under what circumstances the prns are to be used.  Please see instructions for details which were reviewed in writing and the patient given a copy.   To keep things simple, I have asked the patient to first separate medicines that are perceived as maintenance, that is to be taken daily "no matter what", from those medicines that are taken on only on an as-needed basis and I have given the patient examples of both, and then return to see our NP to generate a  detailed  medication calendar which should be followed until the next physician sees the patient and updates it.

## 2011-03-15 NOTE — Assessment & Plan Note (Addendum)
Clearly contributing to symptoms - reviewed with pt that this is not the same as treating heartburn.   See instructions for specific recommendations which were reviewed directly with the patient who was given a copy with highlighter outlining the key components.

## 2011-03-27 ENCOUNTER — Encounter: Payer: Self-pay | Admitting: Adult Health

## 2011-03-29 ENCOUNTER — Telehealth: Payer: Self-pay | Admitting: Internal Medicine

## 2011-03-29 ENCOUNTER — Ambulatory Visit (INDEPENDENT_AMBULATORY_CARE_PROVIDER_SITE_OTHER): Payer: MEDICARE | Admitting: Adult Health

## 2011-03-29 ENCOUNTER — Encounter: Payer: Self-pay | Admitting: Adult Health

## 2011-03-29 DIAGNOSIS — J471 Bronchiectasis with (acute) exacerbation: Secondary | ICD-10-CM

## 2011-03-29 NOTE — Telephone Encounter (Signed)
Dr. Sherene Sires, please advise if okay for pt to change to MR's care.  Thanks.

## 2011-03-29 NOTE — Telephone Encounter (Signed)
Fine with me if MR agrees - an althernative is to wait and see Dr Kendrick Fries who arrives in July and has a special interest in this type of problem.

## 2011-03-29 NOTE — Progress Notes (Signed)
Subjective:    Patient ID: Christy Richardson, female    DOB: Aug 28, 1935, 76 y.o.   MRN: [id number]  HPI    Primary svc/   Tapper/ PA Kristian Covey    35 yowf never smoker but exp to passive smoke by husband with bronchiectasis/GERD  with indolent onset doe x around 2006 and gradually worse mostly with housework or talking assoc with sensation of congestion in throat but no excess mucus sleeping or sleep disturbance.   06/21/08 new consult: was concerned that the problem might be her atenolol after what she read on the package insert but was told that she needed to stay on beta-blockers, so changed from atenolol to bystolic. not able to reproduce any dyspnea or desaturation walking 3 laps 185 feet each at a normal pace.   July 12, 2008 "not much better at all with breathing/dry cough or swallowing, still choking on foods. No noct exac. rx reglan started  August 23, 2008 reports doing some better with breathing and talking but not at 100%. No overt heartburn, just sense of throat congestion   November 08, 2008 ov reporting no worse after stop PPI : congestion esp after eating, no early am cough, no hemoptysis, never produces excess mucus   December 13, 2008 stopped reglan and all PPI and no worse, satisfied zyrtec treats drainage ok and did not take chlortrimeton due to fears of glaucoma.   September 26, 2009 ov Acute visit. Pt c/o prod cough with blood streaked sputum x 2 wks. She states that her breathing is fine. She c/o "feeling groggy" in her throat. no epistaxis. rec levaquin   March 03, 2010 Acute visit. Pt c/o prod cough with blood tinged sputum since this am with sore throat x 3 days. She also c/o nasal congestion and PND "for a long time". rec add back ppi   Apr 17, 2010 6 wk followup with PFT's. Pt states that her cough is still bothering her. She states that cough is mainly dry and hacky but when does produce sputum it is clear. She states that "throat feels full"- has to clear throat  frequently esp after eat. no nocturnal or am exac. rec add pepcid at hs and 1st gen H1 as needed  For pnds   January 19, 2011 ov cc baseline sob and wheezy but does not prevent activities desired as long as takes time, also slowed by hips, then abruptly worse in January with cough with hemoptyis and stuffy nose and gen tightness > UC and took levaquin now back to baseline, no purulent sputum, no hemoptysis. rec  GERD (REFLUX)  Diet and trial of  Dulera 100 2 puffs every 12 hours if needed   Work on inhaler technique:     03/15/2011 ov c/o cough congestion no abx for over a month.  Last round doxycyline changed mucus to clear but anxious that she's going to have another flare and won't be able to keep taking abx.   >>inhaler teaching and pepcid added   03/29/2011 Follow up and med review Pt returns for follow up and med review. We reviewed her meds and organized them into a med calendar.  Last ov she had bronchiectatic flare now improving with decreased cough and congestion . Nearing back to her baseline.   Has some cough with clear mucus and hoarseness-but is better. Worse in am.       :  Past Medical History:  MITRAL VALVE PROLAPSE (ICD-424.0)  ABNORMAL ELECTROCARDIOGRAM (ICD-794.31)  MALAISE AND FATIGUE (  ICD-780.79)  NECK MASS (ICD-784.2)  - Goiter by bx 10/22/08 (neg cytology)  UTI'S, RECURRENT (ICD-599.0)  FECAL INCONTINENCE (ICD-787.6)  DECREASED HEARING (ICD-389.9)  MIGRAINE HEADACHE (ICD-346.90)  GLAUCOMA NEC (ICD-365.89)  IBS (ICD-564.1)  OSTEOARTHRITIS (ICD-715.90)  HYPOTHYROIDISM (ICD-244.9)  HYPERTENSION (ICD-401.9)  HYPERLIPIDEMIA (ICD-272.4)  GERD (ICD-530.81)  - Positive Esophagram 03/03/08 with marked GE reflux and small HH  DIVERTICULOSIS, COLON (ICD-562.10)  DEPRESSION (ICD-311)  ANXIETY (ICD-300.00)  ALLERGIC RHINITIS (ICD-477.9)  PULMONARY NODULES  - CT CHEST 11/08/08 > Positive bilateral lower lobe bronchiectasis  - Alpha 1 screen Apr 17, 2010>>> neg but no  genotype > repeat January 19, 2011=  MM - Allergy profile sent Apr 17, 2010 >>> IgE 47 with grass> mold pos DYSPNEA ON EXERTION (ICD-786.09)  - PFTs 08/23/08 mild obstruction with ratio 65% no reversibility  - PFT's Apr 17, 2010 FEV1 1.93 (80%)  ratio58, DLC0 71% improves to 92%  - [facility] January 19, 2011 75% p coaching > 25 %         Review of Systems Constitutional:   No  weight loss, night sweats,  Fevers, chills, fatigue, or  lassitude.  HEENT:   No headaches,  Difficulty swallowing,  Tooth/dental problems, or  Sore throat,                No sneezing, itching, ear ache, nasal congestion, post nasal drip,   CV:  No chest pain,  Orthopnea, PND, swelling in lower extremities, anasarca, dizziness, palpitations, syncope.   GI  No heartburn, indigestion, abdominal pain, nausea, vomiting, diarrhea, change in bowel habits, loss of appetite, bloody stools.   Resp: No shortness of breath with exertion or at rest.  No excess mucus, no productive cough,  No non-productive cough,  No coughing up of blood.  No change in color of mucus.  No wheezing.  No chest wall deformity  Skin: no rash or lesions.  GU: no dysuria, change in color of urine, no urgency or frequency.  No flank pain, no hematuria   MS:  No joint pain or swelling.  No decreased range of motion.  No back pain.  Psych:  No change in mood or affect. No depression or anxiety.  No memory loss.          Objective:   Physical Exam  Pleasant female in NAD HEENT mild turbinate edema.  Oropharynx no thrush or excess pnd or cobblestoning.  No JVD or cervical adenopathy. Mild accessory muscle hypertrophy. Trachea midline, nl thryroid. Chest was hyperinflated by percussion with diminished breath sounds and moderate increased exp time without wheeze. Hoover sign positive at mid inspiration. Regular rate and rhythm without murmur gallop or rub or increase P2 or edema.  Abd: no hsm, nl excursion. Ext warm without cyanosis or clubbing.            Assessment & Plan:

## 2011-03-29 NOTE — Patient Instructions (Signed)
Follow med calendar closely and bring to each visit.  follow up Dr. Sherene Sires  In 2 months and As needed

## 2011-03-30 ENCOUNTER — Other Ambulatory Visit: Payer: Self-pay | Admitting: *Deleted

## 2011-03-30 NOTE — Telephone Encounter (Signed)
Spoke with pt and advised of recs per MR and MW.  Pt verbalized understanding. She was unsure of whether she wants to sched consult with MR or Dr. Kendrick Fries.  She states that she would be willing to go to [facility] to see him if this is what she decides.  She states will give it some more thought and call back once she has decided.

## 2011-03-30 NOTE — Telephone Encounter (Signed)
MR, pls advise if you are ok with this switch.  Thanks.

## 2011-03-30 NOTE — Telephone Encounter (Signed)
I am happy to accept. But as Dr. Sherene Sires said you could offer Dr.McQuaid to her but currently unclear if he will start in Christy Richardson office at all. He might go to a satellite

## 2011-04-02 ENCOUNTER — Ambulatory Visit: Payer: MEDICARE | Admitting: Internal Medicine

## 2011-04-02 NOTE — Assessment & Plan Note (Signed)
Recent flare Now resolved.  Patient's medications were reviewed today and patient education was given. Computerized medication calendar was adjusted/completed Plan Cont on current regimen

## 2011-04-17 NOTE — Op Note (Signed)
Christy Richardson, Christy Richardson                ACCOUNT NO.:  [id number]   MEDICAL RECORD NO.:  [id number]          PATIENT TYPE:  INP   LOCATION:  5039                         FACILITY:  [facility]   PHYSICIAN:  Loreta Ave, M.D. DATE OF BIRTH:  17-Apr-1935   DATE OF PROCEDURE:  04/20/2009  DATE OF DISCHARGE:                               OPERATIVE REPORT   PREOPERATIVE DIAGNOSIS:  End-stage degenerative arthritis, left hip.   POSTOPERATIVE DIAGNOSIS:  End-stage degenerative arthritis, left hip  with relative retroversion of acetabular position.   PROCEDURE:  Left total hip replacement utilizing Stryker rejuvenate  prosthesis.  Press-Fit 54-mm cup screw fixation x2 and a 10-degree  overhang 36-mm internal diameter polyethylene insert.  Press-Fit femoral  component #7 rejuvenate component.  A 38-mm neck with 8 degrees of  anteversion 127-degree neck angle.  A 36.0 Bio-Lock head.   SURGEON:  Loreta Ave, MD   ASSISTANT:  Genene Churn. Barry Dienes, [place], present throughout the entire case  necessary for timely completion of procedure.   ANESTHESIA:  General.   BLOOD LOSS:  400 mL.   BLOOD GIVEN:  None.   SPECIMENS:  None.   CULTURES:  None.   COMPLICATIONS:  None.   DRESSINGS:  Soft compressive with abduction pillow.   PROCEDURE:  The patient was brought to the operating room, placed on the  operating table in supine position.  After adequate anesthesia was  obtained, turned to a lateral position.  Prepped and draped in usual  sterile fashion.  Leg lengths assessed.  A little bit shorter on the  left side, little bit less than a centimeter.  Incision along the shaft  of the femur extending posterosuperior.  Skin and subcutaneous tissue  divided.  Iliotibial band exposed and incised.  Neurovascular structure  protected.  External rotator cuffs were taken down off the back and the  intertrochanteric groove of the femur and tagged with FiberWire.  Hip  dislocated posteriorly.  Grade 4 changes  throughout.  Femoral neck was  cut one fingerbreadth above the lesser trochanter.  Excellent bone  stalk.  Acetabulum exposed.  This was tilted slightly more retroverted  than normal once the entire acetabulum was uncovered.  Good bone stalk  however.  Reamed up to good bleeding bone.  Sufficient inferior medial  placement.  Sized for a 54-mm cup.  I pushed this into this much  anteversion as her acetabulum would allow.  A 45 degrees of abduction  and about 15 of anteversion.  Good fixation capturing augmented with two  screws through the cup.  A insert was then inserted with a 10-degree  overhang placed posterosuperior.  Pleased with alignment and fixation.  Femur exposed.  Sequential reaming, handheld and power reamers and  broaches for appropriate sizing fitting and filling with a #7 rejuvenate  stem.  After appropriate trials, the rejuvenate stem was seated.  I had  the 38-mm neck with 8 degrees of anteversion 147 degrees neck angle.  The 36-mm Bio-Lock head was attached and the hip reduced.  Excellent  stability in flexion and extension.  Equal leg lengths.  Wound  irrigated.  External rotator and capsule repaired of the back of the  intertrochanteric groove through drill holes and then FiberWire tied  over a bony bridge.  Charnley retractor removed.  Iliotibial band closed  with #1 Vicryl.  Skin and subcutaneous tissue with Vicryl and staples.  Margins were injected with Marcaine.  Sterile compressive dressing  applied.  Returned to supine position.  Abduction pillow placed.  Anesthesia reversed.  Brought to the recovery room.  Tolerated the  surgery well.  No complications.      Loreta Ave, M.D.  Electronically Signed     DFM/MEDQ  D:  04/20/2009  T:  04/21/2009  Job:  [id number]

## 2011-04-17 NOTE — Discharge Summary (Signed)
Christy Richardson, Christy Richardson                ACCOUNT NO.:  [id number]   MEDICAL RECORD NO.:  [id number]          PATIENT TYPE:  INP   LOCATION:  5039                         FACILITY:  [facility]   PHYSICIAN:  Loreta Ave, M.D. DATE OF BIRTH:  10/29/1935   DATE OF ADMISSION:  04/20/2009  DATE OF DISCHARGE:  04/22/2009                               DISCHARGE SUMMARY   FINAL DIAGNOSES:  1. Status post left total hip replacement for end-stage degenerative      joint disease.  2. Hypertension.  3. Hypothyroidism.  4. Dysphagia.  5. Mitral valve prolapse.  6. Migraines.  7. Hyperlipidemia.  8. Diverticulosis.  9. Depression/anxiety.  10.Bronchiectasis.  11.History of pulmonary nodules.   HISTORY OF PRESENT ILLNESS:  A 76 year old white female with history of  end-stage DJD, left hip, and chronic pain presented to our office for a  preop evaluation for total hip replacement.  She had a progressively  worsening pain with failed response with conservative treatment.  Significant decrease in her daily activities due to the ongoing  complaint.   HOSPITAL COURSE:  On Apr 20, 2009, patient was taken to the [facility]  OR and a left total hip replacement procedure performed.  Surgeon Mckinley Jewel, M.D., and assistant Zonia Kief, P.A.-C.  Anesthesia general.  No specimens.  EBL 400 mL.  No drain used.  There were no surgical or  anesthesia complications and patient was transferred to recovery in  stable condition.  Started on pharmacy protocol Coumadin and a Lovenox  bridge for DVT prophylaxis.  Apr 21, 2009, patient doing well with good  pain control.  Temp 101.  Pulse 96.  Respirations 18.  Blood pressure  105/57.  INR 1.2.  CBC and BMET were pending.  Dressing clean, dry, and  intact.  Calf nontender and neurovascularly intact.  Skin warm and dry.  Patient lives alone and was agreeing with skilled nursing facility  placement that was discussed.  Stated today that she preferred [facility]  in [place] if available.  Discontinued morphine PCA.  PT/OT  consults.  Encouraged incentive spirometry.  Apr 22, 2009, patient has  good pain control.  Complains of feeling dizzy and lightheaded when up  with therapy this morning.  Denied chest pain, shortness of breath,  abdominal pain.  Some nausea.  Positive flatus.  She has not had a bowel  movement yet.  Temp 98.4.  Pulse 84.  Respirations 18.  Blood pressure  89/49 at 8 a.m. and recheck standing 10 a.m. at 103/56.  WBC 8.9,  hemoglobin 9.4, hematocrit 27.0, platelets 157, sodium 136, potassium  3.5, chloride 102, CO2 of 29, BUN 3, creatinine 0.83, glucose 125, INR  2.2.  Wound looks good.  Staples intact.  No drainage or signs of  infection.  Calf nontender and neurovascularly intact.  Abdomen soft and  nontender.  Bed available at [facility].  She is okay to transfer once  she has had a bowel movement and nausea resolve.  Dizziness and  lightheadedness likely result of hypotension along with Percocet.  Given  a 500 mL  bolus of normal saline now.  Blood pressure med held this a.m.  Discontinue Percocet and we will start Norco 5/325 one to 2 tabs p.o.  every 6 to 8 hours p.r.n. for pain.  States that she is ready for  transfer today if she is feeling better.   DISCHARGE MEDICATIONS:  1. Norco 5/325 one to 2 tabs p.o. every 6 to 8 hours p.r.n. for pain.  2. Robaxin 500 mg 1 tab p.o. every 6 hours p.r.n. for spasms.  3. Coumadin pharmacy protocol x4 weeks postop for DVT prophylaxis.      Maintain INR 2 to 3.  4. Resume home medications per medication reconciliation form that has      been filled out.   DISPOSITION:  Transfer to [facility].   CONDITION:  Stable.   INSTRUCTIONS:  While at the [facility], patient will continue to work  with PT/OT to improve ambulation and hip strengthening.  She is  weightbearing as tolerated with walker and can progress to a 4-prong  cane as tolerated.  Total hip replacement and precautions  must be  followed.  Daily dressing changes with 4 x 4 gauze and tape.  She is  okay to shower but no tub soaking.  Hip staples to be removed 2 weeks  postop and this can be done in our office at a followup visit.  Do not  apply any creams or ointments to her incision.  Will remain on Coumadin  x4 weeks postoperative DVT prophylaxis.  INR must be maintained at 2 to  3.  She will follow up with Dr. Eulah Pont 2 weeks postop.  Please call us  immediately at [phone] if there are any questions or concerns  before that time.      Genene Churn. Denton Meek.      Loreta Ave, M.D.  Electronically Signed    JMO/MEDQ  D:  04/22/2009  T:  04/22/2009  Job:  [id number]

## 2011-04-17 NOTE — Assessment & Plan Note (Signed)
Christy Richardson, Christy Richardson                 CHART#:  [id number]   DATE:  05/23/2007                       DOB:  Sep 16, 1935   OFFICE FOLLOWUP   Followup screening colonoscopy 03/05/2007 demonstrated normal rectum,  small adenoma from the sigmoid, which was removed.  She also had left-  sided diverticula.  We planned for her to come back in 5 years for a  colonoscopy.  Dr. Renne Crigler asked [place] to see her back for altered bowel  function.  She has apparently had trouble with recurrent urinary tract  infections over the years.  Much worse since colonoscopy.  She tells me  today that she feels that she may actually have contaminated herself by  giving herself an enema in her vagina, rather than in her rectum, in  preparation for colonoscopy, and thinks this may have incited her UTI.  She gives a life-long history of chronic constipation, but over the past  few years, she has had a bowel movement daily to every other day, she  sometimes has a bowel movement and then continues to have some fecal  seepage/leakage soiling her undergarments, which is quite distressing,  understandably.  She has not had any rectal bleeding or melena.  She is  not really taking any fiber supplements.  She takes dicyclomine, which  she has had for a year or so, on a p.r.n. basis.  She is not doing much  in the way of fiber at this point in time.  Dr. Renne Crigler recommended fiber  supplement.   CURRENT MEDICATIONS:  See updated list.   ALLERGIES:  Penicillin and sulfa.   FAMILY HISTORY:  Negative for chronic GI or liver disease.   SURGERIES:  Include hysterectomy and scar tissue removed from her  stomach.  Mastoid surgery x2.   EXAM:  She appears at her baseline.  Weight 154, height 5 feet 9 inches, temperature 98.4, BP 130/80, pulse  68.  SKIN:  Warm and dry.  There is no jaundice.  CHEST AND LUNGS:  Clear to auscultation.  CARDIAC:  Regular rate and rhythm without murmur, gallop, or rub.  BREASTS:  Deferred.  ABDOMEN:   Nondistended.  Positive bowel sounds.  Soft.  Entirely  nontender.  Without appreciable mass or organomegaly.   ASSESSMENT:  The patient is a very pleasant 76 year old lady who is  status post colonoscopy revealing left-sided diverticular diminutive  adenoma for which she is scheduled to have a repeat colonoscopy in 5  years.  She has a tendency towards fecal seepage leakage on occasion.   RECOMMENDATIONS:  I agree with Dr. Renne Crigler, a fiber supplement would be in  her best interest.  I think psyllium is the best agent for her.  To this  end, I have recommended that she start taking Metamucil wafers or  powder, 1 dose daily with a pint of water.  I would like her to take the  Metamucil either in the afternoon or the morning, but stick with that  timing, and then I would like her to start taking Questran 2 g orally  daily, not to be taken within 2 hours of other medications, and to be  taken at the opposite end of the day from her Metamucil dosing.  She was  counseled on this.  I told her this was an off-label use and hoped  that  this would bind up her stool and produce relative constipation, but yet  with a bulking agent, she will have more control over her bowel function  and less seepage issues.  She is to keep a stool diary.  Plan to see  this nice lady back in 8 weeks.       Jonathon Bellows, M.D.  Electronically Signed     RMR/MEDQ  D:  05/23/2007  T:  05/23/2007  Job:  [id number]   cc:   Rosario Jacks

## 2011-04-17 NOTE — Assessment & Plan Note (Signed)
NAME:  Christy Richardson, Christy Richardson                 CHART#:  [id number]   DATE:  09/17/2007                       DOB:  [birth year]   REFERRING PHYSICIAN:  Franchot Heidelberg, M.D.   PROBLEM LIST:  1. History of inability to control bowels since at least 15 years,      likely secondary to irritable bowel syndrome.  2. Adenomatous polyp in April, 2008.  3. Hyperthyroidism.  4. Hypertension.  5. Anxiety.  6. Diverticulosis.  7. Gastroesophageal reflux disease.  8. Hyperlipidemia.   ALLERGIES:  PENICILLIN, SULFA.   SUBJECTIVE:  Patient is a 76 year old female who was last seen in  September, 2008.  She was asked to add Metamucil, stop the  cholestyramine, and avoid milk products.  She was also asked to add a  probiotic daily.  She went off the milk for a week but did not see any  change.  She has been taking the Metamucil.  She is still bloated at  times.  Certain foods, like broccoli, cabbage, and beans make her  symptoms worse.  She has been taking the probiotic for only 2-3 weeks.  She does not necessarily have a bowel movement daily.  Sometimes she  skips a day here and there.  Sometimes she has a firm and large stool,  and other times they are smaller and soft.  She has not had anymore  problems with fecal leakage and not realizing she has had a bowel  movement.  Avoiding foods help.  The diarrhea is gone, but her bloating  persists.   OBJECTIVE:  Weight 157 pounds.  Height 5 feet 9 inches.  Temperature  98.1.  Body Mass Index 24.6 (healthy).  Blood pressure 122/80, pulse 72.  In general, she is in no apparent distress.  Her lungs are clear to  auscultation bilaterally.  Cardiovascular:  Regular rhythm.  No murmur.  Normal S1 and S2.  Abdomen:  Bowel sounds are present and soft.  Nontender, nondistended.  No rebound or guarding.   ASSESSMENT:  Patient is a 76 year old female who likely has had  irritable bowel syndrome for many years.  Her symptoms are improved with  Metamucil.  Thank you  for allowing me to see patient in consultation.  My recommendations follow.   RECOMMENDATIONS:  1. Screening colonoscopy in 2013 due to adenomatous polyp diagnosed in      April 2008.  2. Continue Metamucil.  3. Continue probiotic.  4. She may follow up with me as needed if her diarrhea should return.      Would consider evaluating for bacterial overgrowth or lactose      intolerance.       Kassie Mends, M.D.  Electronically Signed     SM/MEDQ  D:  09/17/2007  T:  09/18/2007  Job:  [id number]   cc:   Franchot Heidelberg, M.D.

## 2011-04-17 NOTE — Assessment & Plan Note (Signed)
Christy Richardson, Christy Richardson                 CHART#:  [id number]   DATE:  08/12/2007                       DOB:  15-Dec-1934   REASON FOR VISIT:  Fecal incontinence.   HISTORY OF PRESENT ILLNESS:  Christy Richardson is a 76 year old female who was  worked up for the same complaint approximately 15 years ago by Dr.  Corinda Gubler in [place]. She was unable to control her bowels. After an  extensive workup, they came to the conclusion that she had irritable  bowel syndrome. She has had 2 children vaginally but no prolonged labor.  She believes she received stitches twice. She was seen in June 2008 by  Dr. Jena Gauss, who recommended Questran and Metamucil. The cholestyramine  did not seem to help and so she has discontinued it. The Metamucil has  made her symptoms better. She complains of certain foods, especially  beans and cabbage, cause her to have loose stools and subsequently, she  is unable to feel the stool seeping out and she will end up with soiled  underwear and clothes. She complains of having solid stool and loose  stool. At times, she can have formed stool for a week and never have any  problems with loose stool. She is under a lot of stress. She had a  colonoscopy in April 2008. She has had barium enema in the past as well.  She has no abdominal cramping or pain associated with the loose stools.  She was on antibiotics in June 2008 for urinary tract infection. She  denies any well water. Yesterday, her bowel movements were firm, firm,  and then soft. Sometimes she has large caliber stool and then sometimes  it is little twirls. She has a bowel movement daily. She cannot really  identify the frequency of her loose stools. She has not traveled  anywhere and has no weight loss. Her appetite is too good. If she is  stressed, her symptoms become more frequent. She does have her  gallbladder. She drinks milk every day with her cereal. She rarely  drinks cheese or ice cream.   PAST MEDICAL HISTORY:  1.  Adenomatous polyp in April 2008.  2. Hypothyroidism.  3. Hypertension.  4. Anxiety.  5. Diverticulosis.  6. Gastroesophageal reflux disease.  7. Hyperlipidemia.   ALLERGIES:  PENICILLIN, SULFA.   MEDICATIONS:  1. Atenolol.  2. Zoloft.  3. Alprazolam 1 mg p.r.n.  4. Travatan.  5. Cosopt.  6. Alphagan.  7. Synthroid.  8. Trazadone.  9. Zyrtec.   OBJECTIVE:  VITAL SIGNS:  Weight 155 pounds (unchanged since June 2008),  height 5 foot 9 inches. Temperature 97.7. Blood pressure 126/80, pulse  76. BMI 22.9 (healthy.)  GENERAL:  No apparent distress. Alert and oriented times four.  HEENT:  Atraumatic, normocephalic. Pupils are equal, round, and reactive  to light. Mouth, no oral lesions. Posterior pharynx without erythema or  exudate.  NECK:  Full range of motion. No lymphadenopathy.  LUNGS:  Clear to auscultation bilaterally.  CARDIOVASCULAR:  Regular rhythm. No murmur. Normal S1 and S2.  ABDOMEN:  Bowel sounds present. Soft, nontender, and nondistended. No  rebound, no guarding. No abdominal bruits or pulsatile masses.  EXTREMITIES:  Without edema or cyanosis.  NEUROLOGIC:  She has no focal neurologic deficits.  RECTAL:  She has formed stool in her vault, which  is heme negative. No  masses palpated. She has very weak sphincter tone, even with Valsalva.   ASSESSMENT:  Christy Richardson is a 76 year old female who has had intermittent  history of inability to control her bowels with loose stool. She likely  has some pelvic floor weakness due to prior vaginal births and age. She  has had symptomatic improvement on the Metamucil but not complete  resolution of her symptoms. Suggest that perhaps she has an intolerance  to components of her diet. The most likely offender is lactose in her  milk. She could also have some degree of bacterial overgrowth.   Thank you for allowing me to see Christy Richardson in consultation. My  recommendations follow.   RECOMMENDATIONS:  1. She is asked to avoid  milk for an entire week. If her symptoms do      not improve, then she is asked to add Probiotic daily.  2. Will check her TSH to make sure she is not over-replacing and that      is the etiology for her irregular bowel habits.  3. She is to continue the Metamucil. She may discontinue the use of      cholestyramine. She is given instructions on the management of her      loose stools in writing.  4. She has a followup appointment to see me in 1 month.       Kassie Mends, M.D.  Electronically Signed     SM/MEDQ  D:  08/12/2007  T:  08/12/2007  Job:  [id number]   cc:   Franchot Heidelberg, M.D.

## 2011-04-20 NOTE — Op Note (Signed)
NAME:  Christy Richardson, Christy Richardson                ACCOUNT NO.:  [id number]   MEDICAL RECORD NO.:  [id number]          PATIENT TYPE:  AMB   LOCATION:  DAY                           FACILITY:  [facility]   PHYSICIAN:  R. Roetta Sessions, M.D. DATE OF BIRTH:  04/02/1935   DATE OF PROCEDURE:  03/05/2007  DATE OF DISCHARGE:                               OPERATIVE REPORT   PROCEDURE:  Screening colonoscopy.  Colonoscopy with biopsy.   INDICATIONS FOR PROCEDURE:  The patient is a 76 year old lady with  occasional postprandial abdominal cramps and diarrhea.  She eats beans,  but otherwise devoid of any lower GI tract symptoms, sent over courtesy  Franchot Heidelberg, M.D. for colorectal cancer screening.  Dr. Corinda Gubler  down in [place] did a screening colonoscopy on her 10 years ago  without significant findings.  There is no family history colorectal  neoplasia.  There is no history of tiny polyps previously.  Colonoscopy  is now being done as screening maneuver.  This approach has discussed  with the patient at length.  Potential risks, benefits and alternatives  have been reviewed, questions answered.  She is agreeable.  Please see  documentation in the medical record.   PROCEDURE NOTE:  O2 saturation, blood pressure, pulse and respirations  monitored throughout the entirety of procedure.   CONSCIOUS SEDATION:  Versed 2 mg IV, Demerol 50 mg IV in divided doses.   INSTRUMENT:  Pentax video chip system.   FINDINGS:  Digital rectal exam revealed no abnormalities.   ENDOSCOPIC FINDINGS:  The prep was adequate. There was a coating of  stool in the right side that made exam some more difficult at that  point.  Examination colonic mucosa was undertaken from rectosigmoid  junction.  Scope was advanced through the left, transverse, right colon  to the area of appendiceal orifice, ileocecal valve and cecum.  These  structures well seen and photographed for the record.  From this level  the scope was slowly  cautiously withdrawn.  All previous mentioned  mucosal surfaces were again seen.  The patient has scattered left-sided  diverticula and a diminutive 3 mm polyp at  25 cm which was cold  biopsied and removed.  Scope was pulled down to the rectum.  Thorough  examination of the rectal mucosa with retroflex view of the anal verge  demonstrated no abnormalities.  The patient tolerated the procedure  well, was reacted in endoscopy.   IMPRESSION:  1. Normal rectum.  2. Diminutive polyp at 25 cm cold biopsy/removed, left-sided      diverticula.  Remainder colonic mucosa appeared normal.   RECOMMENDATIONS:  1. Diverticulosis literature provided Ms. Doroteo Glassman.  2. Follow-up on path.  3. Further recommendations to follow.      Jonathon Bellows, M.D.  Electronically Signed     RMR/MEDQ  D:  03/05/2007  T:  03/05/2007  Job:  [id number]   cc:   Franchot Heidelberg, M.D.

## 2011-05-07 ENCOUNTER — Telehealth: Payer: Self-pay | Admitting: Internal Medicine

## 2011-05-07 MED ORDER — MOMETASONE FURO-FORMOTEROL FUM 100-5 MCG/ACT IN AERO: 2.0000 | INHALATION_SPRAY | Freq: Two times a day (BID) | RESPIRATORY_TRACT | Status: DC

## 2011-05-07 NOTE — Telephone Encounter (Signed)
Refill sent and pt aware. Dailon Sheeran, CMA  

## 2011-05-08 ENCOUNTER — Other Ambulatory Visit (HOSPITAL_COMMUNITY): Payer: Self-pay | Admitting: Internal Medicine

## 2011-05-08 DIAGNOSIS — Z139 Encounter for screening, unspecified: Secondary | ICD-10-CM

## 2011-05-17 ENCOUNTER — Ambulatory Visit (HOSPITAL_COMMUNITY)
Admission: RE | Admit: 2011-05-17 | Discharge: 2011-05-17 | Disposition: A | Discharge status: Home / Self Care | Payer: Medicare Other | Source: Ambulatory Visit | Attending: Internal Medicine | Admitting: Internal Medicine

## 2011-05-17 DIAGNOSIS — Z1231 Encounter for screening mammogram for malignant neoplasm of breast: Secondary | ICD-10-CM | POA: Insufficient documentation

## 2011-05-17 DIAGNOSIS — Z139 Encounter for screening, unspecified: Secondary | ICD-10-CM

## 2011-05-23 ENCOUNTER — Ambulatory Visit (INDEPENDENT_AMBULATORY_CARE_PROVIDER_SITE_OTHER): Payer: Medicare Other | Admitting: Internal Medicine

## 2011-05-23 ENCOUNTER — Encounter: Payer: Self-pay | Admitting: Internal Medicine

## 2011-05-23 VITALS — BP 122/82 | HR 76 | Temp 98.0°F | Ht 68.0 in | Wt 162.4 lb

## 2011-05-23 DIAGNOSIS — R0600 Dyspnea, unspecified: Secondary | ICD-10-CM

## 2011-05-23 DIAGNOSIS — R0609 Other forms of dyspnea: Secondary | ICD-10-CM

## 2011-05-23 DIAGNOSIS — R05 Cough: Secondary | ICD-10-CM

## 2011-05-23 NOTE — Patient Instructions (Addendum)
Please take advair (medium dose) hfa 2 puff bid (take samples) I am going to review your chart and get back to you over phone\  .... Update 6/24 - see GI for GERD - see cardiology for hx of mitral valve prolapse and if that is causing dyspnea - ROV after above

## 2011-05-23 NOTE — Progress Notes (Signed)
Subjective:    Patient ID: Christy Richardson, female    DOB: 11/12/1935, 76 y.o.   MRN: [id number]  HPI Primary svc/   Tapper/ PA Vincenza Hews Tysinger  Problem List 1. Bilateral Lower Lobe Bronchiectasis (Mild)   - seen on CT chest 11/15/2008. Alpha 1 - MM in feb 2012. Normal CT sinus  - allergy profile Apr 17, 2010  - IgE 47 with grass and mold positive  2. GERD  - Positive esophagogram 03/03/2008 with marked GE reflux and small hiatal hernia  3. Goiter 10/22/2008 - negative cytology  4. Dyspnea - PFTs 08/23/2008 - mild obstruction, Ratio 65%, No reversibility. No desaturation 185 feet x 3 laps @ normal pace  - PFT 04/17/2010 - Fev1 1.9L/80%, Ratio 58, DLC 71%  5. Chronic Cough  06/21/08 new consult:75 yowf never smoker but exp to passive smoke by husband with bronchiectasis/GERD  with indolent onset doe x around 2006 and gradually worse mostly with housework or talking assoc with sensation of congestion in throat but no excess mucus sleeping or sleep disturbance. Was concerned that the problem might be her atenolol after what she read on the package insert but was told that she needed to stay on beta-blockers, so changed from atenolol to bystolic. not able to reproduce any dyspnea or desaturation walking 3 laps 185 feet each at a normal pace.   July 12, 2008 "not much better at all with breathing/dry cough or swallowing, still choking on foods. No noct exac. rx reglan started  August 23, 2008 reports doing some better with breathing and talking but not at 100%. No overt heartburn, just sense of throat congestion   November 08, 2008 ov reporting no worse after stop PPI : congestion esp after eating, no early am cough, no hemoptysis, never produces excess mucus   December 13, 2008 stopped reglan and all PPI and no worse, satisfied zyrtec treats drainage ok and did not take chlortrimeton due to fears of glaucoma.   September 26, 2009 ov Acute visit. Pt c/o prod cough with blood streaked sputum x 2  wks. She states that her breathing is fine. She c/o "feeling groggy" in her throat. no epistaxis. rec levaquin   March 03, 2010 Acute visit. Pt c/o prod cough with blood tinged sputum since this am with sore throat x 3 days. She also c/o nasal congestion and PND "for a long time". rec add back ppi   Apr 17, 2010 6 wk followup with PFT's. Pt states that her cough is still bothering her. She states that cough is mainly dry and hacky but when does produce sputum it is clear. She states that "throat feels full"- has to clear throat frequently esp after eat. no nocturnal or am exac. rec add pepcid at hs and 1st gen H1 as needed  For pnds   January 19, 2011 ov cc baseline sob and wheezy but does not prevent activities desired as long as takes time, also slowed by hips, then abruptly worse in January with cough with hemoptyis and stuffy nose and gen tightness > UC and took levaquin now back to baseline, no purulent sputum, no hemoptysis. rec  GERD (REFLUX)  Diet and trial of  Dulera 100 2 puffs every 12 hours if needed   Work on inhaler technique:     03/15/2011 ov c/o cough congestion no abx for over a month.  Last round doxycyline changed mucus to clear but anxious that she's going to have another flare and won't be able to  keep taking abx.   >>inhaler teaching and pepcid added   03/29/2011 Follow up and med review Pt returns for follow up and med review. We reviewed her meds and organized them into a med calendar.  Last ov she had bronchiectatic flare now improving with decreased cough and congestion . Nearing back to her baseline.   Has some cough with clear mucus and hoarseness-but is better. Worse in am  OV 05/23/2011: TRANSFER OF CARE TO DR RAMASWAMY: No acute issues. This is transfer of care.  Reports chronic dyspnea for 4-6 years. Brought on by exerton and with exposures to fumes of clorox. Relieved by rest and inhalers but finds dulera very expensive. There is associated baseline cough, gerd, post  nasal drainage and hoarse voice. Also reports hx of frequent exacerbations of above necessitating antibiotics and prednisone.  Atleast 3 exacerbations in 2012. C Multiple interventions aimed at obstructive lung disease and LPR cough have not helped or she has been reluctant. Currently in stable state. She has never attended pulmonary rehabilitation.    Review of Systems  Constitutional: Negative for fever and unexpected weight change.  HENT: Positive for sore throat. Negative for ear pain, nosebleeds, congestion, rhinorrhea, sneezing, trouble swallowing, dental problem, postnasal drip and sinus pressure.   Eyes: Negative for redness and itching.  Respiratory: Positive for cough and shortness of breath. Negative for chest tightness and wheezing.   Cardiovascular: Negative for palpitations and leg swelling.  Gastrointestinal: Negative for nausea and vomiting.  Genitourinary: Negative for dysuria.  Musculoskeletal: Negative for joint swelling.  Skin: Negative for rash.  Neurological: Negative for headaches.  Hematological: Does not bruise/bleed easily.  Psychiatric/Behavioral: Negative for dysphoric mood. The patient is not nervous/anxious.        Objective:   Physical Exam  Vitals reviewed. Constitutional: She is oriented to person, place, and time. She appears well-developed and well-nourished. No distress.  HENT:  Head: Normocephalic and atraumatic.  Right Ear: External ear normal.  Left Ear: External ear normal.  Mouth/Throat: Oropharynx is clear and moist. No oropharyngeal exudate.  Eyes: Conjunctivae and EOM are normal. Pupils are equal, round, and reactive to light. Right eye exhibits no discharge. Left eye exhibits no discharge. No scleral icterus.  Neck: Normal range of motion. Neck supple. No JVD present. No tracheal deviation present. No thyromegaly present.  Cardiovascular: Normal rate, regular rhythm, normal heart sounds and intact distal pulses.  Exam reveals no gallop and no  friction rub.   No murmur heard. Pulmonary/Chest: Effort normal and breath sounds normal. No respiratory distress. She has no wheezes. She has no rales. She exhibits no tenderness.  Abdominal: Soft. Bowel sounds are normal. She exhibits no distension and no mass. There is no tenderness. There is no rebound and no guarding.  Musculoskeletal: Normal range of motion. She exhibits no edema and no tenderness.  Lymphadenopathy:    She has no cervical adenopathy.  Neurological: She is alert and oriented to person, place, and time. She has normal reflexes. No cranial nerve deficit. She exhibits normal muscle tone. Coordination normal.  Skin: Skin is warm and dry. No rash noted. She is not diaphoretic. No erythema. No pallor.  Psychiatric: She has a normal mood and affect. Her behavior is normal. Judgment and thought content normal.          Assessment & Plan:

## 2011-05-27 ENCOUNTER — Encounter: Payer: Self-pay | Admitting: Internal Medicine

## 2011-05-27 ENCOUNTER — Telehealth: Payer: Self-pay | Admitting: Internal Medicine

## 2011-05-27 DIAGNOSIS — R0600 Dyspnea, unspecified: Secondary | ICD-10-CM | POA: Insufficient documentation

## 2011-05-27 NOTE — Assessment & Plan Note (Addendum)
This is her dominant complaints since she started visiting [facility] in 2009. She feels no relief. Dyspnea definitely multifactorial .  Bronchiectasis and mild consequent PFT abnormality also playing a role but dulera and other interventions have not helped. So, I suspect Anxiety, and deconditioning. However, current status of mitral valve is unknown. I do not see any echo or cardiology visits. Will start by getting her to see cardiology/get echo. Will also refer her to rehab  In addition, will change dulera to advair hfa due to cost

## 2011-05-27 NOTE — Telephone Encounter (Signed)
Jen  Please call patient. I told her to expect call after chart reviw folliowig visit 05/23/11  A. Regarding cough - when was her last GI eal for GERD. Given significant hx of pripr GERD and no notes of her seeing GI in echart in a while, I think she should see a GI doc. Send her back to whoever her established GI doc is. If she has none and wants female: Dr Loreta Ave or Dr Juanda Chance  B. Regarding dyspnea - please find out when she had last echo or cards visit. She should see a cardiologist. If no established cards, refer to Dr. Jacinto Halim (for small group/individual MD practice) or [facility] (for group)

## 2011-05-27 NOTE — Assessment & Plan Note (Signed)
The dominant component of cough appears to be LPR cough with GERD playing a significant role. Brnchiectasis probably contributing a bit to cough. Reviewing char I dont know when she last saw GI. I think she should see GI and try to get GERD under control. I will also see if she is usnig netti pot. If not, will have her do that. FU after seeing GI

## 2011-06-05 NOTE — Telephone Encounter (Signed)
Spoke with the patient and she states she does not have a lot of money and cannot afford to go to all these appointments. She states she will think about it and call me back. Carron Curie, CMA

## 2011-09-19 HISTORY — PX: CARDIOVASCULAR STRESS TEST: SHX262

## 2011-09-19 HISTORY — PX: TRANSTHORACIC ECHOCARDIOGRAM: SHX275

## 2011-10-12 ENCOUNTER — Other Ambulatory Visit: Payer: Self-pay | Admitting: Medical

## 2012-02-28 ENCOUNTER — Encounter: Payer: Self-pay | Admitting: Internal Medicine

## 2012-05-14 ENCOUNTER — Encounter: Payer: Self-pay | Admitting: Pulmonary Disease

## 2012-05-14 ENCOUNTER — Ambulatory Visit (INDEPENDENT_AMBULATORY_CARE_PROVIDER_SITE_OTHER): Payer: Medicare Other | Admitting: Pulmonary Disease

## 2012-05-14 VITALS — BP 126/80 | HR 71 | Temp 98.1°F | Ht 68.0 in | Wt 156.0 lb

## 2012-05-14 DIAGNOSIS — J471 Bronchiectasis with (acute) exacerbation: Secondary | ICD-10-CM

## 2012-05-14 DIAGNOSIS — R918 Other nonspecific abnormal finding of lung field: Secondary | ICD-10-CM

## 2012-05-14 DIAGNOSIS — J479 Bronchiectasis, uncomplicated: Secondary | ICD-10-CM

## 2012-05-14 NOTE — Patient Instructions (Signed)
Keep using your medications as prescribed Use the flutter valve and doxycycline when you develop chest congestion. Keep using saline rinses as you are doing for your sinus symptoms. Call us if you decide to do the swallowing study. We will refer you to a local speech pathologist. We will repeat a non-contrast CT chest in 6 months to evaluate the pulmonary nodules.   We will see you back in 6 months or sooner if needed.

## 2012-05-14 NOTE — Progress Notes (Signed)
Subjective:    Patient ID: Christy Richardson, female    DOB: [birth year], 77 y.o.   MRN: [id number]  HPI This is a very pleasant lady with bronchiectasis diagnosed in 2009 by Dr. Sherene Sires who comes to our clinic to establish care in [place].  She states that her childhood was noted by a 6 week illness with "whooping cough" when she was in the first grade.  Afterwards she had no difficulty until late adulthood when she noted the gradual onset of dyspnea.  She had been attributing the symptom to mitral valve prolapse, but when Dr. Tresa Endo (cardiolgist) noted essentially normal cardiac function a pulmonary work up lead to the diagnosis of bronchiectasis.  She sates that despite her disease she regularly walks 25 minutes daily for exercise.  She notes dyspnea when she climbs a flight of stairs or when carrying groceries.  She can do most housework without difficulty.  She coughs every day but rarely produces phlegm.  However she states that whenever she develops a URI she often gets bronchitis which leads to increased sputum production, wheezing, and dyspnea.  She states that this is usually controlled well with use of her flutter valve, doxycycline, and dulera.  She doesn't need to use these meds otherwise.   She has allergic rhinitis symptoms which are well controlled with saline mist and zyrtec.  She used to be on allergy shots years ago but her symptoms have improved greatly.   She has GERD with a hiatal hernia.  She notes no trouble with food getting stuck when she swallows, but she does note coughing often when eating.  She has had an upper GI/barium swallow but has never had a modified barium swallow or speech evaluation. She is a never smoker but was exposed to heavy second hand smoke as a child. She has had mild hemoptysis over the years and has been hospitalized for pneumonia.     Past Medical History  Diagnosis Date  . Mitral valve prolapse   . Nonspecific abnormal electrocardiogram (ECG) (EKG)   .  Fatigue   . Neck mass   . Recurrent UTI   . Fecal incontinence   . IBS (irritable bowel syndrome)   . Migraine   . Hypothyroidism   . Glaucoma   . Hypertension   . GERD (gastroesophageal reflux disease)   . Diverticulosis of colon (without mention of hemorrhage)   . Depression   . Anxiety   . Allergic rhinitis   . Pulmonary nodules   . DOE (dyspnea on exertion)      Family History  Problem Relation Age of Onset  . Diabetes Father   . Throat cancer Mother   . Coronary artery disease Mother   . Prostate cancer Brother   . Asthma Daughter   . Scoliosis Daughter      History   Social History  . Marital Status: Widowed    Spouse Name: N/A    Number of Children: 2  . Years of Education: 10th grade   Occupational History  . Retired     Engineering geologist   Social History Main Topics  . Smoking status: Never Smoker   . Smokeless tobacco: Never Used  . Alcohol Use: No  . Drug Use: No  . Sexually Active: Not on file   Other Topics Concern  . Not on file   Social History Narrative   Widowed since 2002. Lives with her Daughter.     Allergies  Allergen Reactions  . Penicillins  REACTION: Rash  . Prednisone     REACTION: Increase eye pressure with gloucoma. Broke out in rash after injection per Dr. Eulah Pont  . Sulfonamide Derivatives     REACTION: Rash - not sure     Outpatient Prescriptions Prior to Visit  Medication Sig Dispense Refill  . acetaminophen (TYLENOL) 325 MG tablet Use as needed      . albuterol (PROAIR HFA) 108 (90 BASE) MCG/ACT inhaler Inhale 2 puffs into the lungs every 3 (three) hours as needed for wheezing.      . Alpha-D-Galactosidase (BEANO) TABS Take 1 tablet by mouth as needed.      . ALPRAZolam (XANAX) 1 MG tablet Take 1 tablet (1 mg total) by mouth at bedtime as needed.      Marland Kitchen aspirin 81 MG tablet Take 81 mg by mouth daily.        . brimonidine (ALPHAGAN P) 0.1 % SOLN Place 1 drop into the left eye 2 (two) times daily. As directed      . Calcium  Carbonate-Vitamin D (CALTRATE 600+D) 600-400 MG-UNIT per tablet Take 1 tablet by mouth 2 (two) times daily.        . cetirizine (ZYRTEC) 10 MG tablet Take 1 tablet (10 mg total) by mouth at bedtime.      . dorzolamide-timolol (COSOPT) 22.3-6.8 MG/ML ophthalmic solution Place 1 drop into the left eye 2 (two) times daily.        Marland Kitchen doxycycline (VIBRA-TABS) 100 MG tablet 1 twice daily as needed w/ flutter      . famotidine (PEPCID) 20 MG tablet Take 20 mg by mouth at bedtime.        Marland Kitchen guaifenesin (HUMIBID E) 400 MG TABS 1-3 every 12 hours as needed      . HYDROcodone-acetaminophen (VICODIN) 5-500 MG per tablet 1 tablet every 6 (six) hours as needed for pain.      Marland Kitchen levothyroxine (SYNTHROID, LEVOTHROID) 25 MCG tablet Take 25 mcg by mouth daily.        . Mometasone Furo-Formoterol Fum (DULERA) 100-5 MCG/ACT AERO Inhale 2 puffs into the lungs 2 (two) times daily.  1 Inhaler  3  . Multiple Vitamins-Minerals (MULTIVITAMIN WITH MINERALS) tablet Take 1 tablet by mouth daily.        . pravastatin (PRAVACHOL) 40 MG tablet Take 1 tablet (40 mg total) by mouth at bedtime.      . psyllium (METAMUCIL SMOOTH TEXTURE) 58.6 % powder 1 teaspoon by mouth once daily      . sertraline (ZOLOFT) 100 MG tablet Take 1 tablet (100 mg total) by mouth at bedtime. 2 at bedtime      . sodium chloride (OCEAN NASAL SPRAY) 0.65 % nasal spray Use as needed      . travoprost, benzalkonium, (TRAVATAN) 0.004 % ophthalmic solution Place 1 drop into the left eye at bedtime. As directed      . traZODone (DESYREL) 50 MG tablet Take 50 mg by mouth at bedtime.        . bisoprolol (ZEBETA) 5 MG tablet Take 5 mg by mouth 2 (two) times daily.        . lansoprazole (PREVACID) 15 MG capsule Take 1 capsule (15 mg total) by mouth daily before breakfast.         Review of Systems  Constitutional: Positive for unexpected weight change. Negative for fever and chills.  HENT: Positive for congestion, rhinorrhea, trouble swallowing, postnasal drip and  sinus pressure. Negative for ear pain, nosebleeds, sore throat,  sneezing, dental problem and voice change.   Eyes: Negative for visual disturbance.  Respiratory: Positive for cough and shortness of breath. Negative for choking.   Cardiovascular: Negative for chest pain and leg swelling.  Gastrointestinal: Negative for vomiting, abdominal pain and diarrhea.  Genitourinary: Negative for difficulty urinating.  Musculoskeletal: Negative for arthralgias.  Skin: Negative for rash.  Neurological: Positive for headaches. Negative for tremors and syncope.  Hematological: Does not bruise/bleed easily.       Objective:   Physical Exam Filed Vitals:   05/14/12 1459  BP: 126/80  Pulse: 71  Temp: 98.1 F (36.7 C)  TempSrc: Oral  Height: 5\' 8"  (1.727 m)  Weight: 156 lb (70.761 kg)  SpO2: 96%   Gen: well appearing, no acute distress HEENT: NCAT, PERRL, EOMi, OP clear, neck supple without masses PULM: Few insp crackles in bases, otherwise clear CV: RRR, split S2, no murmur no JVD AB: BS+, soft, nontender, no hsm Ext: warm, trace edema, no clubbing, no cyanosis Derm: no rash or skin breakdown Neuro: A&Ox4, CN II-XII intact, strength 5/5 in all 4 extremities  05/2012 Spirometry Ratio 63%,  FEV1 1.9L (78% pred)  04/2010 PFT Ratio 58%, FEV1 1.98L (82% pred) TLC 5.75 L (99% pred), DLCO 71%  11/2008 CT chest (my read): diffuse concentric bronchiectasis and multiple scattered pulmonary nodules.  Lower lobe predominant disease      Assessment & Plan:   BRONCHIECTASIS W/ACUTE EXACERBATION Ms. Larusso has been doing well since her last office visit with [place] in the [place] office several months ago.  Her spirometry data today is unchanged from her prior studies.  I think that her shortness of breath is due to her bronchiectasis, but perhaps there is a component of deconditioning as well. She likely has bronchiectasis due to a lengthy pertussis infection she had in childhood, but I also question  ongoing aspiration as she notes that she coughs after eating. She understands her disease well and knows to start using her flutter valve and doxycycline if and when she develops chest congestion.        Plan: -encouraged her to have a swallowing evaluation, but she wants to think about this first; she will let us know if she wants it -Rx for doxycycline written for an as needed basis -continue doxy and flutter valve when URI/chest congestion develops -continue dulera as needed for chest congestion -encouraged exercise   Pulmonary nodules Multiple seen on 2009 CT chest.  These are likely related to her bronchiectasis and recurrent pulmonary infections.    Plan: -repeat CT chest on return visit to ensure no interval change   Updated Medication List Outpatient Encounter Prescriptions as of 05/14/2012  Medication Sig Dispense Refill  . acetaminophen (TYLENOL) 325 MG tablet Use as needed      . albuterol (PROAIR HFA) 108 (90 BASE) MCG/ACT inhaler Inhale 2 puffs into the lungs every 3 (three) hours as needed for wheezing.      . Alpha-D-Galactosidase (BEANO) TABS Take 1 tablet by mouth as needed.      . ALPRAZolam (XANAX) 1 MG tablet Take 1 tablet (1 mg total) by mouth at bedtime as needed.      Marland Kitchen aspirin 81 MG tablet Take 81 mg by mouth daily.        . brimonidine (ALPHAGAN P) 0.1 % SOLN Place 1 drop into the left eye 2 (two) times daily. As directed      . Calcium Carbonate-Vitamin D (CALTRATE 600+D) 600-400 MG-UNIT per tablet Take 1  tablet by mouth 2 (two) times daily.        . cetirizine (ZYRTEC) 10 MG tablet Take 1 tablet (10 mg total) by mouth at bedtime.      . dorzolamide-timolol (COSOPT) 22.3-6.8 MG/ML ophthalmic solution Place 1 drop into the left eye 2 (two) times daily.        Marland Kitchen doxycycline (VIBRA-TABS) 100 MG tablet 1 twice daily as needed w/ flutter      . famotidine (PEPCID) 20 MG tablet Take 20 mg by mouth at bedtime.        Marland Kitchen guaifenesin (HUMIBID E) 400 MG TABS 1-3 every 12  hours as needed      . HYDROcodone-acetaminophen (VICODIN) 5-500 MG per tablet 1 tablet every 6 (six) hours as needed for pain.      Marland Kitchen levothyroxine (SYNTHROID, LEVOTHROID) 25 MCG tablet Take 25 mcg by mouth daily.        . Mometasone Furo-Formoterol Fum (DULERA) 100-5 MCG/ACT AERO Inhale 2 puffs into the lungs 2 (two) times daily.  1 Inhaler  3  . Multiple Vitamins-Minerals (MULTIVITAMIN WITH MINERALS) tablet Take 1 tablet by mouth daily.        . nebivolol (BYSTOLIC) 10 MG tablet Take 10 mg by mouth daily.      Marland Kitchen omeprazole (PRILOSEC) 40 MG capsule Take 40 mg by mouth daily.      . pravastatin (PRAVACHOL) 40 MG tablet Take 1 tablet (40 mg total) by mouth at bedtime.      . psyllium (METAMUCIL SMOOTH TEXTURE) 58.6 % powder 1 teaspoon by mouth once daily      . sertraline (ZOLOFT) 100 MG tablet Take 1 tablet (100 mg total) by mouth at bedtime. 2 at bedtime      . sodium chloride (OCEAN NASAL SPRAY) 0.65 % nasal spray Use as needed      . travoprost, benzalkonium, (TRAVATAN) 0.004 % ophthalmic solution Place 1 drop into the left eye at bedtime. As directed      . traZODone (DESYREL) 50 MG tablet Take 50 mg by mouth at bedtime.        Marland Kitchen DISCONTD: bisoprolol (ZEBETA) 5 MG tablet Take 5 mg by mouth 2 (two) times daily.        Marland Kitchen DISCONTD: lansoprazole (PREVACID) 15 MG capsule Take 1 capsule (15 mg total) by mouth daily before breakfast.

## 2012-05-14 NOTE — Assessment & Plan Note (Addendum)
Christy Richardson has been doing well since her last office visit with [place] in the [place] office several months ago.  Her spirometry data today is unchanged from her prior studies.  I think that her shortness of breath is due to her bronchiectasis, but perhaps there is a component of deconditioning as well. She likely has bronchiectasis due to a lengthy pertussis infection she had in childhood, but I also question ongoing aspiration as she notes that she coughs after eating. She understands her disease well and knows to start using her flutter valve and doxycycline if and when she develops chest congestion.        Plan: -encouraged her to have a swallowing evaluation, but she wants to think about this first; she will let us know if she wants it -Rx for doxycycline written for an as needed basis -continue doxy and flutter valve when URI/chest congestion develops -continue dulera as needed for chest congestion -encouraged exercise

## 2012-05-15 DIAGNOSIS — R918 Other nonspecific abnormal finding of lung field: Secondary | ICD-10-CM | POA: Insufficient documentation

## 2012-05-15 NOTE — Assessment & Plan Note (Addendum)
Multiple seen on 2009 CT chest.  These are likely related to her bronchiectasis and recurrent pulmonary infections, but radiology recommended a follow up study to ensure no abnormal growth or evidence of malignancy.    Plan: -repeat CT chest on return visit to ensure no interval change

## 2012-05-30 ENCOUNTER — Other Ambulatory Visit (HOSPITAL_COMMUNITY): Payer: Self-pay | Admitting: Internal Medicine

## 2012-05-30 DIAGNOSIS — Z139 Encounter for screening, unspecified: Secondary | ICD-10-CM

## 2012-06-02 ENCOUNTER — Telehealth: Payer: Self-pay | Admitting: Pulmonary Disease

## 2012-06-02 DIAGNOSIS — J479 Bronchiectasis, uncomplicated: Secondary | ICD-10-CM

## 2012-06-02 NOTE — Telephone Encounter (Signed)
Per 05-14-12 ov note with BMQ: Patient Instructions     Keep using your medications as prescribed  Use the flutter valve and doxycycline when you develop chest congestion.  Keep using saline rinses as you are doing for your sinus symptoms.  Call us if you decide to do the swallowing study. We will refer you to a local speech pathologist.  We will repeat a non-contrast CT chest in 6 months to evaluate the pulmonary nodules.  We will see you back in 6 months or sooner if needed.    Called spoke with patient who stated that she has decided to go ahead with the swallowing study.  She would also like some additional information on why BMQ thinks a referral to speech pathologist is necessary.  Dr Kendrick Fries please advise if okay to place the swallowing study order and on the pt's requested info on a speech pathologist, thanks.  Dr Kendrick Fries is on night float this week; in the [place] office next week.  Pt stated there is no rush on this and is okay with a call back next week. Will forward to [facility] for recs.

## 2012-06-03 NOTE — Telephone Encounter (Signed)
Agree that she needs a swallowing evaluation.  Please order a modified barium swallow test at [facility].  Please explain to them that the modified barium swallow test is usually performed by a speech pathologist.  That is the reason for the speech referral.

## 2012-06-04 NOTE — Telephone Encounter (Signed)
Pt isa ware of BQ response. I have placed order and for this to be done at [facility]. Please advise PCC thanks

## 2012-06-04 NOTE — Telephone Encounter (Signed)
[email or web address]  06/12/12@11 :00am pt is aware Tobe Sos

## 2012-06-09 ENCOUNTER — Ambulatory Visit (HOSPITAL_COMMUNITY)
Admission: RE | Admit: 2012-06-09 | Discharge: 2012-06-09 | Disposition: A | Discharge status: Home / Self Care | Payer: Medicare Other | Source: Ambulatory Visit | Attending: Internal Medicine | Admitting: Internal Medicine

## 2012-06-09 DIAGNOSIS — Z1231 Encounter for screening mammogram for malignant neoplasm of breast: Secondary | ICD-10-CM | POA: Insufficient documentation

## 2012-06-09 DIAGNOSIS — Z139 Encounter for screening, unspecified: Secondary | ICD-10-CM

## 2012-06-12 ENCOUNTER — Ambulatory Visit: Payer: Self-pay | Admitting: Pulmonary Disease

## 2012-07-08 ENCOUNTER — Encounter: Payer: Self-pay | Admitting: Pulmonary Disease

## 2012-07-09 ENCOUNTER — Telehealth: Payer: Self-pay | Admitting: *Deleted

## 2012-07-09 DIAGNOSIS — R1310 Dysphagia, unspecified: Secondary | ICD-10-CM

## 2012-07-09 NOTE — Telephone Encounter (Signed)
Called pt to inform of results/recs-LMTCB

## 2012-07-09 NOTE — Telephone Encounter (Signed)
Message copied by Christen Butter on Wed Jul 09, 2012  1:48 PM ------      Message from: Veto Kemps B      Created: Tue Jul 08, 2012  5:25 PM       L,            Can you let her know that based on the results of the Modified Barium Swallow I would like her to have a DG esophogram?            Thanks,      Kipp Brood

## 2012-07-09 NOTE — Telephone Encounter (Signed)
Returning call.

## 2012-07-10 NOTE — Telephone Encounter (Signed)
Spoke with pt and notified of results recs per BQ. She is okay with ordering DS esophagus and wants this scheduled at [facility].  Will hold in my basket until I know what dx to use and then will send order to [facility].

## 2012-07-10 NOTE — Telephone Encounter (Signed)
Pt returned call. Christy Richardson  

## 2012-07-15 ENCOUNTER — Telehealth: Payer: Self-pay | Admitting: Pulmonary Disease

## 2012-07-15 DIAGNOSIS — R1310 Dysphagia, unspecified: Secondary | ICD-10-CM

## 2012-07-15 NOTE — Telephone Encounter (Signed)
Christy Richardson this pt said she did an mbs 06/12/12@[facility]  what am i to set up this time just a barium? Thanks libby

## 2012-07-15 NOTE — Telephone Encounter (Signed)
Pt was to have dg esophagus. Please advise PCC's this was ordered 07/10/12 thanks

## 2012-07-18 ENCOUNTER — Other Ambulatory Visit: Payer: Self-pay | Admitting: Pulmonary Disease

## 2012-07-18 DIAGNOSIS — R1310 Dysphagia, unspecified: Secondary | ICD-10-CM

## 2012-07-18 NOTE — Telephone Encounter (Signed)
Per result note from Dr. Kendrick Fries the pt needs a DG Esophogram.  I have placed this order and sent to [facility]. Carron Curie, CMA

## 2012-07-23 ENCOUNTER — Ambulatory Visit: Payer: Self-pay | Admitting: Pulmonary Disease

## 2012-07-29 ENCOUNTER — Encounter: Payer: Self-pay | Admitting: Pulmonary Disease

## 2012-07-29 ENCOUNTER — Telehealth: Payer: Self-pay | Admitting: *Deleted

## 2012-07-29 NOTE — Telephone Encounter (Signed)
Spoke with pt and advised of results per BQ. She verbalized understanding.

## 2012-07-29 NOTE — Telephone Encounter (Signed)
Message copied by Christen Butter on Tue Jul 29, 2012 12:14 PM ------      Message from: Veto Kemps B      Created: Tue Jul 29, 2012  3:30 AM       L,            Please let her know that her barium swallow was OK            B

## 2012-07-29 NOTE — Telephone Encounter (Signed)
LMTCB

## 2012-08-05 ENCOUNTER — Encounter: Payer: Self-pay | Admitting: Internal Medicine

## 2012-08-05 ENCOUNTER — Ambulatory Visit (INDEPENDENT_AMBULATORY_CARE_PROVIDER_SITE_OTHER): Payer: Medicare Other | Admitting: Internal Medicine

## 2012-08-05 VITALS — BP 132/74 | HR 68 | Ht 68.0 in | Wt 158.0 lb

## 2012-08-05 DIAGNOSIS — K219 Gastro-esophageal reflux disease without esophagitis: Secondary | ICD-10-CM

## 2012-08-05 DIAGNOSIS — Z860100 Personal history of colon polyps, unspecified: Secondary | ICD-10-CM

## 2012-08-05 DIAGNOSIS — R1310 Dysphagia, unspecified: Secondary | ICD-10-CM

## 2012-08-05 DIAGNOSIS — K589 Irritable bowel syndrome without diarrhea: Secondary | ICD-10-CM

## 2012-08-05 MED ORDER — NA SULFATE-K SULFATE-MG SULF 17.5-3.13-1.6 GM/177ML PO SOLN: ORAL | Status: DC

## 2012-08-05 MED ORDER — ALIGN PO CAPS: 1.0000 | ORAL_CAPSULE | Freq: Every day | ORAL | Status: DC

## 2012-08-05 NOTE — Patient Instructions (Addendum)
You have been scheduled for an endoscopy and colonoscopy with propofol. Please follow the written instructions given to you at your visit today. Please pick up your prep at the pharmacy within the next 1-3 days. If you use inhalers (even only as needed), please bring them with you on the day of your procedure.  We have given you samples of Align. This puts good bacteria back into your colon. You should take 1 capsule by mouth once daily. If this works well for you, it can be purchased over the counter.  You have been given a gas and flatulence prevention diet to read and follow today.  Thank you for choosing me and [facility].  Iva Boop, M.D., [facility]

## 2012-08-05 NOTE — Progress Notes (Signed)
Subjective:    Patient ID: Christy Richardson, female    DOB: [birth year], 77 y.o.   MRN: [id number]  HPI This is a very pleasant elderly white woman here to discuss several issues.  I have seen her in 2009 and Dr. Jena Gauss in 2008.  The first one is reflux. She really has a globus sensation, intermittent solid food dysphagia and tightness and pressure sensation in the suprasternal area. She has bronchiectasis and has been thought to have some form of GERD over the years. Her current pulmonologist have her do a modified barium swallow and a regular barium swallow in [place], in August. I have reviewed those reports and they are normal except for cervical degenerative change with prominent deformity the posterior cervical esophagus noted on the standard barium swallow. She also had a barium swallow in 2009 which I reviewed, there was some aortic root impression, but she swallowed a 13 mm tablet without difficulty, she had a small hiatal hernia. She takes omeprazole 40 mg nightly before bed. She has some heartburn but not much. He mostly has these atypical symptoms. He has never had an upper endoscopy or esophageal dilation. He does report having a fiberoptic laryngoscopy procedure by Dr. Jenne Campus, the otolaryngologist in [place] which was apparently unremarkable a couple of years ago and she also saw Dr. Gerilyn Pilgrim here in [place].  The next issue is gas and bloating and intermittent loose stools. Talking to her and reviewing previous record shows that she used to be constipated. Sometime in the last several years she's had intermittent episodic diarrhea and loose stools. There was some fecal incontinence at times and Dr. Jena Gauss found her to have a weak sphincter tone and thought she had some pelvic floor issues. With the use of being ON dietary restriction she is better but is still bothered by this. She feels gassy and bloated at times. Particular foods that cause problems are things like beans, and roughage  such as CABG or other cruciferous vegetables/legumes. Her symptoms are really episodic inducing to be related to these foods. She has been tried on cholestyramine in the past without significant benefit, a trial of lactose-free was unhelpful. She does not eat sugar-free candy. She has gas and bloating and belching as well, she drinks one carbonated beverage a maximum a day that is diet and caffeine free. He does drink some tea, coffee and fruit punch.  She also had a diminutive adenoma removed at colonoscopy in 2008. She has received notification that she should have a colonoscopy again. He wishes to proceed with this.  There's been no weight change. Her GI review of systems is otherwise negative.  Allergies  Allergen Reactions  . Penicillins     REACTION: Rash  . Prednisone     REACTION: Increase eye pressure with gloucoma. Broke out in rash after injection per Dr. Eulah Pont  . Sulfonamide Derivatives     REACTION: Rash - not sure   Outpatient Prescriptions Prior to Visit  Medication Sig Dispense Refill  . acetaminophen (TYLENOL) 325 MG tablet Use as needed      . Alpha-D-Galactosidase (BEANO) TABS Take 1 tablet by mouth as needed.      . ALPRAZolam (XANAX) 1 MG tablet Take 1 tablet (1 mg total) by mouth at bedtime as needed.      Marland Kitchen aspirin 81 MG tablet Take 81 mg by mouth daily.        . brimonidine (ALPHAGAN P) 0.1 % SOLN Place 1 drop into the left eye 2 (  two) times daily. As directed      . Calcium Carbonate-Vitamin D (CALTRATE 600+D) 600-400 MG-UNIT per tablet Take 1 tablet by mouth 2 (two) times daily.        . cetirizine (ZYRTEC) 10 MG tablet Take 1 tablet (10 mg total) by mouth at bedtime.      . dorzolamide-timolol (COSOPT) 22.3-6.8 MG/ML ophthalmic solution Place 1 drop into the left eye 2 (two) times daily.        Marland Kitchen guaifenesin (HUMIBID E) 400 MG TABS 1-3 every 12 hours as needed      . HYDROcodone-acetaminophen (VICODIN) 5-500 MG per tablet 1 tablet every 6 (six) hours as needed  for pain.      Marland Kitchen levothyroxine (SYNTHROID, LEVOTHROID) 25 MCG tablet Take 25 mcg by mouth daily.        . Mometasone Furo-Formoterol Fum (DULERA) 100-5 MCG/ACT AERO Inhale 2 puffs into the lungs 2 (two) times daily.  1 Inhaler  3  . Multiple Vitamins-Minerals (MULTIVITAMIN WITH MINERALS) tablet Take 1 tablet by mouth daily.        Marland Kitchen omeprazole (PRILOSEC) 40 MG capsule Take 40 mg by mouth daily.      . pravastatin (PRAVACHOL) 40 MG tablet Take 1 tablet (40 mg total) by mouth at bedtime.      . psyllium (METAMUCIL SMOOTH TEXTURE) 58.6 % powder 1 teaspoon by mouth once daily      . sertraline (ZOLOFT) 100 MG tablet Take 1 tablet (100 mg total) by mouth at bedtime. 2 at bedtime      . sodium chloride (OCEAN NASAL SPRAY) 0.65 % nasal spray Use as needed      . travoprost, benzalkonium, (TRAVATAN) 0.004 % ophthalmic solution Place 1 drop into the left eye at bedtime. As directed      . traZODone (DESYREL) 50 MG tablet Take 50 mg by mouth at bedtime.        . famotidine (PEPCID) 20 MG tablet Take 20 mg by mouth at bedtime.        Marland Kitchen doxycycline (VIBRA-TABS) 100 MG tablet 1 twice daily as needed w/ flutter      . albuterol (PROAIR HFA) 108 (90 BASE) MCG/ACT inhaler Inhale 2 puffs into the lungs every 3 (three) hours as needed for wheezing.      . nebivolol (BYSTOLIC) 10 MG tablet Take 10 mg by mouth daily.       Past Medical History  Diagnosis Date  . Mitral valve prolapse   . Nonspecific abnormal electrocardiogram (ECG) (EKG)   . Fatigue   . Neck mass   . Recurrent UTI   . Fecal incontinence   . IBS (irritable bowel syndrome)   . Migraine   . Hypothyroidism   . Glaucoma   . Hypertension   . GERD (gastroesophageal reflux disease)   . Diverticulosis of colon (without mention of hemorrhage)   . Depression   . Anxiety   . Allergic rhinitis   . Pulmonary nodules   . DOE (dyspnea on exertion)   . Hyperlipidemia   . Adenomatous polyp of colon 03/2007  . Polyp, sigmoid colon   . Bronchiectasis     Past Surgical History  Procedure Date  . Abdominal hysterectomy   . Oophorectomy   . Mastoidectomy     rt ear  . Total hip arthroplasty 5/10    left  . Colonoscopy    History   Social History  . Marital Status: Widowed    Spouse Name: N/A  Number of Children: 2  . Years of Education: 10th grade   Occupational History  . Retired     Engineering geologist   Social History Main Topics  . Smoking status: Never Smoker   . Smokeless tobacco: Never Used  . Alcohol Use: No  . Drug Use: No    Social History Narrative   Widowed since 2002. Lives with her Daughter.   Family History  Problem Relation Age of Onset  . Diabetes Father   . Throat cancer Mother   . Coronary artery disease Mother   . Prostate cancer Brother   . Asthma Daughter   . Scoliosis Daughter   . Colon cancer Neg Hx        Review of Systems Positive for neck pain, ask pain, arthritis, chronic intermittent coughing, decrease hearing, some dyspnea at times, urinary incontinence, depression which is generally been controlled and she thinks her medicines no work as well as a used to. She also has allergies and sinus problems including some postnasal drip. All other review of systems are negative.    Objective:   Physical Exam General:  Well-developed, well-nourished and in no acute distress Eyes:  anicteric. ENT:   Mouth and posterior pharynx free of lesions. Teeth in good repair Neck:   supple w/o thyromegaly or mass.  Lungs: Clear to auscultation bilaterally. Heart:  S1S2, no rubs, murmurs, gallops. Abdomen:  soft, non-tender, no hepatosplenomegaly, hernia, or mass and BS+.  Rectal: Deferred until colonoscopy Lymph:  no cervical or supraclavicular adenopathy. Extremities:   no edema Neuro:  A&O x 3.  Psych:  appropriate mood and  Affect.   Data Reviewed: 2008 colonoscopy and pathology report Pulmonary notes 2013 and 2012 Gastroenterlogy notes, Dr. Jena Gauss, 2008  2009 GI notes (me) Problem # 1: COUGH  (ICD-786.2)  Assessment: Unchanged  Not really different off of PPI and not much of a bother, she says. Dyspnea was main reason she saw Dr. Sherene Sires and that is better, she says.  May need different allergy treatment.  Problem # 2: GERD (ICD-530.81)  Assessment: Comment Only  Manifested on Ba swallow only. No classic sxs on or off PPI. Will leave off PPI as no classic sxs and I am not clear that she has true GERD as barium swallow not always diagnostic even if it shows reflux. The amount described does make one consider microaspiration but she does not have clear sxs of that, I don't think the cough occurs until well after she swallow. If ?'s remain would get modified barium swallow.  Problem # 3: ABNORMAL CHEST XRAY, NODULES (ICD-793.1)  Assessment: Comment Only  She was ambivalent about following up nodules so I will order the CXR and ask her to see Dr. Sherene Sires again.  Orders:  T-2 View CXR (71020TC)  Problem # 4: FECAL INCONTINENCE (ICD-787.6)  Assessment: Improved  Decrease fiber in diet, use Beano as needed also.      Problem # 1: GERD (ICD-530.81)  GERD based on barium swallow. No pyrosis. Cough, throat clearing may be extraintestinal manifestations of GERD. Little improvement on PPI which raises the question of whether she even needs them. EGD probably wouldn't be very helpful at this point given that has been on high dose PPI and is asymptomatic. Ideally patient would get a 24 hour pH probe. Recommend patient discontinue PPI and then we will reevaluate her in a month. At that time we may arrange for an EGD and if there are no endoscopic findings of GERD then we can  place a 24 hour pH probe at the time of procedure.  Problem # 2: IBS (ICD-564.1)  Accompanied by fecal leakage. Patient will resume her Metamucil. Additionally we will try Align probiotic for 14 days.   Assessment & Plan:   1. Dysphagia   2. Personal history of colonic polyps - adenoma 2008   3. IBS (irritable bowel syndrome)     4. GERD (gastroesophageal reflux disease)    1. The dysphagia and GERD-like symptoms are probably part GERD, part anxiety, parts cervical spine issues. Endoscopy with possible dilation is discussed, including the risks benefits and indications. We have decided to proceed with that as it may help her if she is dilated, and it might provide findings not seen on other studies, though I cautioned her that it usually doesn't given the chronicity of her problems and the nature of them, especially the dysphagia to solid foods at times it was appropriate. 2. Colonoscopy will be scheduled because of personal history of the diminutive adenoma removed over 5 years ago.The risks and benefits as well as alternatives of endoscopic procedure(s) have been discussed and reviewed. All questions answered. The patient agrees to proceed. 3. Align 1 capsule daily samples for a month given. 4. Low gas and flatulence prevention diet information given. I explained to her that the food the trigger her symptoms are prone to causing these symptoms and some people she seems to have an exaggerated response. Diet modification certainly may help. The align may help as well. Depending upon things she may be considered for a trial of antibiotics but she really seems have intermittent symptoms specifically related to food. A FODMAPS restricted  Diet (fermentable oligo-, di-, mono-saccahrides and polyols) may be helpful depending upon these trials. 5. She's taking her omeprazole at bedtime, I recommended she take it before supper. 6. Try to reassess rectal tone prior to sedation when she comes for her procedures. Dr. Jena Gauss did find her to have decreased sphincter tone which he thought was likely from childbirth issues. 7. Note that many of her symptoms are similar to what they were back in 2009 and 2008 when previously seen in GI clinic. This is reassuring. The dysphagia history seems somewhat new. At that time and 2009 I had taken her off  PPI, I'm not totally convinced that is making a difference in this lady and may not be worth continuing. Esophageal impedance and/or pH probe study could be needed depending upon the upper endoscopy.  I appreciate the opportunity to care for this patient.   CC: HALL,ZACK, MD and D. Heber Landrum, MD

## 2012-08-07 ENCOUNTER — Telehealth: Payer: Self-pay | Admitting: Internal Medicine

## 2012-08-07 NOTE — Telephone Encounter (Signed)
Pt informed that pre-med not needed for her upcoming proceedures.

## 2012-08-07 NOTE — Telephone Encounter (Signed)
Believe it or not - colonoscopy is cleaner as far as infection risk compared to dental work so she doe NOT need to premedicate

## 2012-08-07 NOTE — Telephone Encounter (Signed)
Pt forgot to tell you that she has to pre-medicate before dental procedures b/c of left hip replacement and also mitral valve prolapse.  She has 100mg  Doxyicycline on hand.  Does she need to pre-medicate for colon/endo?  Please advise.  Thank you.

## 2012-08-12 ENCOUNTER — Ambulatory Visit (AMBULATORY_SURGERY_CENTER): Payer: Medicare Other | Admitting: Internal Medicine

## 2012-08-12 ENCOUNTER — Encounter: Payer: Self-pay | Admitting: Internal Medicine

## 2012-08-12 VITALS — BP 155/79 | HR 71 | Temp 96.5°F | Resp 30 | Ht 68.0 in | Wt 158.0 lb

## 2012-08-12 DIAGNOSIS — K219 Gastro-esophageal reflux disease without esophagitis: Secondary | ICD-10-CM

## 2012-08-12 DIAGNOSIS — D126 Benign neoplasm of colon, unspecified: Secondary | ICD-10-CM

## 2012-08-12 DIAGNOSIS — Z1211 Encounter for screening for malignant neoplasm of colon: Secondary | ICD-10-CM

## 2012-08-12 DIAGNOSIS — K5730 Diverticulosis of large intestine without perforation or abscess without bleeding: Secondary | ICD-10-CM

## 2012-08-12 DIAGNOSIS — Z86010 Personal history of colonic polyps: Secondary | ICD-10-CM

## 2012-08-12 DIAGNOSIS — L309 Dermatitis, unspecified: Secondary | ICD-10-CM

## 2012-08-12 DIAGNOSIS — R1310 Dysphagia, unspecified: Secondary | ICD-10-CM

## 2012-08-12 DIAGNOSIS — K589 Irritable bowel syndrome without diarrhea: Secondary | ICD-10-CM

## 2012-08-12 DIAGNOSIS — K6289 Other specified diseases of anus and rectum: Secondary | ICD-10-CM

## 2012-08-12 MED ORDER — SODIUM CHLORIDE 0.9 % IV SOLN: 500.0000 mL | INTRAVENOUS | Status: DC

## 2012-08-12 MED ORDER — NYSTATIN-TRIAMCINOLONE 100000-0.1 UNIT/GM-% EX OINT: TOPICAL_OINTMENT | Freq: Two times a day (BID) | CUTANEOUS | Status: AC

## 2012-08-12 NOTE — Op Note (Signed)
[facility] [address] [place], [address]   COLONOSCOPY PROCEDURE REPORT  PATIENT: Christy Richardson, Christy Richardson  MR#: [id number] BIRTHDATE: Apr 16, 1935 , 76  yrs. old GENDER: Female ENDOSCOPIST: Iva Boop, MD, [facility] REFERRED XB:JYNW Hall, M.D. PROCEDURE DATE:  08/12/2012 PROCEDURE:   Colonoscopy with snare polypectomy ASA CLASS:   Class III INDICATIONS:screening and surveillance,personal history of colonic polyps and patient's personal history of adenomatous colon polyp MEDICATIONS: There was residual sedation effect present from prior procedure, propofol (Diprivan) 150mg  IV, MAC sedation, administered by CRNA, and These medications were titrated to patient response per physician's verbal order  DESCRIPTION OF PROCEDURE:   After the risks benefits and alternatives of the procedure were thoroughly explained, informed consent was obtained.  A digital rectal exam revealed decreased sphincter tone with voluntary squeeze and slight at rest, and no abnormal descent. it also  revealed a rash - perianal dermatitis. The LB CF-H180AL [id number]  endoscope was introduced through the anus and advanced to the cecum, which was identified by both the appendix and ileocecal valve. No adverse events experienced.   The quality of the prep was Suprep excellent  The instrument was then slowly withdrawn as the colon was fully examined.      COLON FINDINGS: Two polypoid shaped sessile polyps (4-6 mm)  were found in the transverse colon and rectum.  A polypectomy was performed with a cold snare.  The resection was complete and the polyp tissue was completely retrieved.   Mild diverticulosis was noted in the sigmoid colon.   The colon mucosa was otherwise normal.  Retroflexed views revealed no abnormalities. The time to cecum=1 minutes 40 seconds.  Withdrawal time=9 minutes 52 seconds. The scope was withdrawn and the procedure completed. COMPLICATIONS: There were no  complications.  ENDOSCOPIC IMPRESSION: 1.   Two 4-6 mm sessile polyps were found in the transverse colon and rectum; polypectomy was performed with a cold snare 2.   Mild diverticulosis was noted in the sigmoid colon 3.   The colon mucosa was otherwise normal with excellent prep 4.   History of diminutive adenoma removal in 2008 5    Decreased anal sphincter tone - voluntary and rest 6.   Perianal dermatitis  RECOMMENDATIONS: Timing of repeat colonoscopy will be determined by pathology findings. Nystatin-triamcinolon ointment for perianal rash Call soon and make an appointment to see Dr. Leone Payor in late October or early November Consider colorectal evaluation to help weak anal sphincter  eSigned:  Iva Boop, MD, [facility] 08/12/2012 4:00 PM   cc: Dwana Melena MD, D. Heber Parrott, MD and The Patient   PATIENT NAME:  [name], [name] MR#: [id number]

## 2012-08-12 NOTE — Progress Notes (Signed)
15:22 Iline Oven, CRNA hung 2nd bag of normal saline 0.9% 500 ml.maw

## 2012-08-12 NOTE — Progress Notes (Signed)
The pt tolerated the egd with dilatation very well. Maw  The pt tolerated the colonoscopy very well. Maw

## 2012-08-12 NOTE — Progress Notes (Signed)
Pt states, "I'm not sure what my actual problem is, but I do become SOB at X's and wheezy.  I have to use my inhaler at times."

## 2012-08-12 NOTE — Patient Instructions (Addendum)
Your upper endoscopy exam showed some tortuosity of the esophagus (? Some spasm) but no other problems. The esophagus was dilated to see if it helps your swallowing difficulty.  The colonoscopy showed two polyps and diverticulosis.  I will send a letter about the pathology results.  I have prescribed an ointment (nystatin/triamcinolone) to clear up the rash near your anal area.  Please call the office and make a follow-up appointment with me for late October or early November.  Thank you for choosing me and [facility].  Iva Boop, MD, FACG  YOU HAD AN ENDOSCOPIC PROCEDURE TODAY AT THE [facility]: Refer to the procedure report that was given to you for any specific questions about what was found during the examination.  If the procedure report does not answer your questions, please call your gastroenterologist to clarify.  If you requested that your care partner not be given the details of your procedure findings, then the procedure report has been included in a sealed envelope for you to review at your convenience later.  Polyp, diverticulosis, high fiber information given.  After dilation diet given with instructions.      YOU SHOULD EXPECT: Some feelings of bloating in the abdomen. Passage of more gas than usual.  Walking can help get rid of the air that was put into your GI tract during the procedure and reduce the bloating. If you had a lower endoscopy (such as a colonoscopy or flexible sigmoidoscopy) you may notice spotting of blood in your stool or on the toilet paper. If you underwent a bowel prep for your procedure, then you may not have a normal bowel movement for a few days.  DIET: Your first meal following the procedure should be a light meal and then it is ok to progress to your normal diet.  A half-sandwich or bowl of soup is an example of a good first meal.  Heavy or fried foods are harder to digest and may make you feel nauseous or bloated.   Likewise meals heavy in dairy and vegetables can cause extra gas to form and this can also increase the bloating.  Drink plenty of fluids but you should avoid alcoholic beverages for 24 hours.  ACTIVITY: Your care partner should take you home directly after the procedure.  You should plan to take it easy, moving slowly for the rest of the day.  You can resume normal activity the day after the procedure however you should NOT DRIVE or use heavy machinery for 24 hours (because of the sedation medicines used during the test).    SYMPTOMS TO REPORT IMMEDIATELY: A gastroenterologist can be reached at any hour.  During normal business hours, 8:30 AM to 5:00 PM Monday through Friday, call [phone].  After hours and on weekends, please call the GI answering service at [phone] who will take a message and have the physician on call contact you.   Following lower endoscopy (colonoscopy or flexible sigmoidoscopy):  Excessive amounts of blood in the stool  Significant tenderness or worsening of abdominal pains  Swelling of the abdomen that is new, acute  Fever of 100F or higher  Following upper endoscopy (EGD)  Vomiting of blood or coffee ground material  New chest pain or pain under the shoulder blades  Painful or persistently difficult swallowing  New shortness of breath  Fever of 100F or higher  Black, tarry-looking stools  FOLLOW UP: If any biopsies were taken you will be contacted by phone or by  letter within the next 1-3 weeks.  Call your gastroenterologist if you have not heard about the biopsies in 3 weeks.  Our staff will call the home number listed on your records the next business day following your procedure to check on you and address any questions or concerns that you may have at that time regarding the information given to you following your procedure. This is a courtesy call and so if there is no answer at the home number and we have not heard from you through the emergency  physician on call, we will assume that you have returned to your regular daily activities without incident.  SIGNATURES/CONFIDENTIALITY: You and/or your care partner have signed paperwork which will be entered into your electronic medical record.  These signatures attest to the fact that that the information above on your After Visit Summary has been reviewed and is understood.  Full responsibility of the confidentiality of this discharge information lies with you and/or your care-partner.

## 2012-08-12 NOTE — Op Note (Signed)
[facility] [address] [place], [address]   ENDOSCOPY PROCEDURE REPORT  PATIENT: Christy Richardson, Christy Richardson  MR#: [id number] BIRTHDATE: Jun 11, 1935 , 76  yrs. old GENDER: Female ENDOSCOPIST: Iva Boop, MD, [facility] REFERRED BY:  Catalina Pizza, M.D. PROCEDURE DATE:  08/12/2012 PROCEDURE:  EGD, diagnostic and Maloney dilation of esophagus ASA CLASS:     Class III INDICATIONS:  dysphagia.   follow up of GERD. MEDICATIONS: propofol (Diprivan) 100mg  IV, MAC sedation, administered by CRNA, and These medications were titrated to patient response per physician's verbal order TOPICAL ANESTHETIC: none  DESCRIPTION OF PROCEDURE: After the risks benefits and alternatives of the procedure were thoroughly explained, informed consent was obtained.  The LB GIF-H180 [id number] endoscope was introduced through the mouth and advanced to the second portion of the duodenum. Without limitations.  The instrument was slowly withdrawn as the mucosa was fully examined.      ESOPHAGUS: The esophagus was tortuous but otherwise normal.  STOMACH: The mucosa of the stomach appeared normal.  DUODENUM: The duodenal mucosa showed no abnormalities in the bulb and second portion of the duodenum.  Retroflexed views revealed no abnormalities.     The scope was then withdrawn from the patient, a 69 [place] Maloney dilator was passed without heme or much resistance,  and the procedure completed.  COMPLICATIONS: There were no complications. ENDOSCOPIC IMPRESSION: 1.   The esophagus was tortuous - dilated to 54 french (18mm) because of dysphagia 2.   The mucosa of the stomach appeared normal 3.   The duodenal mucosa showed no abnormalities in the bulb and second portion of the duodenum  RECOMMENDATIONS: Colonoscopy next Clear liquids until 5PM  , then soft foods rest of day.  Resume prior diet tomorrow. Make appointment to see Dr. Leone Payor in late October or early November (patient to  call)     eSigned:  Iva Boop, MD, [facility] 08/12/2012 3:47 PM   AO:ZHYQ, Timothy Lasso , MD, [facility] and The Patient

## 2012-08-12 NOTE — Progress Notes (Signed)
Patient did not experience any of the following events: a burn prior to discharge; a fall within the facility; wrong site/side/patient/procedure/implant event; or a hospital transfer or hospital admission upon discharge from the facility. (G8907) Patient did not have preoperative order for IV antibiotic SSI prophylaxis. (G8918)  

## 2012-08-13 ENCOUNTER — Telehealth: Payer: Self-pay | Admitting: *Deleted

## 2012-08-13 NOTE — Telephone Encounter (Signed)
  Follow up Call-  Call back number 08/12/2012  Post procedure Call Back phone  # [phone]  Permission to leave phone message Yes     Patient questions:  Do you have a fever, pain , or abdominal swelling? no Pain Score  0 *  Have you tolerated food without any problems? yes  Have you been able to return to your normal activities? yes  Do you have any questions about your discharge instructions: Diet   no Medications  no Follow up visit  no  Do you have questions or concerns about your Care? no  Actions: * If pain score is 4 or above: No action needed, pain <4.

## 2012-08-19 ENCOUNTER — Encounter: Payer: Self-pay | Admitting: Internal Medicine

## 2012-08-19 NOTE — Progress Notes (Signed)
Quick Note:  2 4-6 mm adenomas Will decide w/pcp re: repeat colonoscopy at 81 in 2018 ______

## 2012-09-30 ENCOUNTER — Ambulatory Visit (INDEPENDENT_AMBULATORY_CARE_PROVIDER_SITE_OTHER): Payer: Medicare Other | Admitting: Internal Medicine

## 2012-09-30 ENCOUNTER — Encounter: Payer: Self-pay | Admitting: Internal Medicine

## 2012-09-30 VITALS — BP 132/80 | HR 76 | Ht 67.25 in | Wt 158.4 lb

## 2012-09-30 DIAGNOSIS — R21 Rash and other nonspecific skin eruption: Secondary | ICD-10-CM

## 2012-09-30 DIAGNOSIS — R159 Full incontinence of feces: Secondary | ICD-10-CM

## 2012-09-30 DIAGNOSIS — K219 Gastro-esophageal reflux disease without esophagitis: Secondary | ICD-10-CM

## 2012-09-30 NOTE — Progress Notes (Signed)
  Subjective:    Patient ID: Christy Richardson, female    DOB: 1935/08/25, 77 y.o.   MRN: [id number]  HPI Is a very pleasant elderly white woman known to me from recent upper GI endoscopy, colonoscopy and previous office visit. He was having trouble with dysphagia is improved after Maloney dilation, there was a tortuous esophagus seen at endoscopy. She is on a PPI using omeprazole but is still having some heartburn symptoms. She is frustrated because it's hard for her to know what the she says. She is aware that she will problems with gas and perhaps indigestion as well as loose stools if she eats beans were fibrous foods but continues to do so.  I had discovered a perianal dermatitis at her colonoscopy and prescribed nystatin triamcinolone cream which seem to help. However more recently she had some further problems with fecal incontinence, and had a lot of burning and used Desitin and feels better. She is concerned that something more serious may be going on in the perianal area. Her fecal incontinence is small volume it occurs with bending and moving and twisting, such as when she goes to aerobics. She really cannot always tell when she has this problem and finds it later or when the skin did see her dictated. She does not have urinary incontinence with that but says if she does not urinate when she has the urge she may have problems with leakage.  Medications, allergies, past medical history, past surgical history, family history and social history are reviewed and updated in the EMR.   Review of Systems As above, her triglycerides are elevated and her primary care physician recommended fish oil, Dr. Sherene Sires has told her that might cause gastrointestinal upset that she has not started    Objective:   Physical Exam Well-developed elderly white woman in no acute distress With female staff present inspection the anoderm shows it to be mostly normal though there was a thickened band of slight  hyperpigmentation on either gluteal fold, this is much improved compared to previous exam. Anal wink is absent. Resting sphincter tone appears slightly decreased, the reduced voluntary squeeze is noted as well which is mild to moderate. Simulated defecation shows normal abdominal wall contraction and descent.       Assessment & Plan:   1. Fecal incontinence   2. Perianal rash   3. GERD (gastroesophageal reflux disease)      1. I have offered that she see a colorectal specialist  surgeon regarding her fecal incontinence but she does not wish to pursue that at this time. There may be something minimally invasive that could help her such as the bulking agent injection. 2. In the meantime I last or reduce fiber in her meals, a low fiber diet is provided. She will also add some psyllium to see if that bulk things up to promote better defecation pattern and less incontinence. 3. He made a change of her PPI, she is dissatisfied with where the omeprazole is working, could consider another generic PPI but I told her to make these other moves first and to call back. 4. Will also continue to use Desitin as a barrier to protect the perianal skin.  CC: Dwana Melena, MD

## 2012-09-30 NOTE — Patient Instructions (Addendum)
Continue using the Desitin.  Decrease the fiber in your diet and follow the low fiber diet handout we are giving you today.  Try and work your way up with the metamucil (psylium) from 1 teaspoon to 1 tablespoon everyday.  Thank you for choosing me and [facility].  Iva Boop, M.D., [facility]

## 2012-11-14 ENCOUNTER — Ambulatory Visit: Payer: Self-pay | Admitting: Pulmonary Disease

## 2012-11-18 ENCOUNTER — Encounter: Payer: Self-pay | Admitting: Pulmonary Disease

## 2012-12-02 ENCOUNTER — Encounter: Payer: Self-pay | Admitting: Pulmonary Disease

## 2012-12-18 ENCOUNTER — Telehealth: Payer: Self-pay | Admitting: Pulmonary Disease

## 2012-12-18 NOTE — Telephone Encounter (Signed)
I spoke with pt and she is requesting her CT results from 11/14/12. She had this done at [facility]. Results are scanned into EPIC. Pt aware will call once we are aware of results. Please advise Dr. Kendrick Fries thanks

## 2012-12-18 NOTE — Telephone Encounter (Signed)
Please let her know that her CT was similar to prior, showing bronchiectasis and multiple small nodules which is related to her bronchiectasis.  I need to see her back.  Please help arrange a follow up appointment.

## 2012-12-19 NOTE — Telephone Encounter (Signed)
I spoke with patient about results and she verbalized understanding and had no questions. Pt is scheduled to see BQ 12/23/12 at 1:45 in [place]. Nothing further was needed

## 2012-12-23 ENCOUNTER — Encounter: Payer: Self-pay | Admitting: Pulmonary Disease

## 2012-12-23 ENCOUNTER — Ambulatory Visit (INDEPENDENT_AMBULATORY_CARE_PROVIDER_SITE_OTHER): Payer: Medicare Other | Admitting: Pulmonary Disease

## 2012-12-23 VITALS — BP 142/80 | HR 75 | Temp 98.0°F | Ht 68.0 in | Wt 161.0 lb

## 2012-12-23 DIAGNOSIS — R918 Other nonspecific abnormal finding of lung field: Secondary | ICD-10-CM

## 2012-12-23 DIAGNOSIS — J471 Bronchiectasis with (acute) exacerbation: Secondary | ICD-10-CM

## 2012-12-23 MED ORDER — FLUTICASONE-SALMETEROL 250-50 MCG/DOSE IN AEPB: 1.0000 | INHALATION_SPRAY | Freq: Two times a day (BID) | RESPIRATORY_TRACT | Status: DC

## 2012-12-23 NOTE — Assessment & Plan Note (Addendum)
No change in size of nodules from 2009 through 11/2012, so malignancy exceedingly unlikely.  She may have MAI but does not appear to have active disease (minimal sputum production, no unexplained weight loss, fevers, or chills).  Plan: -no further imaging needed. -if recurrent sputum production, fevers, chills, develops then she needs to give [place] three sputum samples for AFB

## 2012-12-23 NOTE — Progress Notes (Signed)
Subjective:    Patient ID: Christy Richardson, female    DOB: 05/14/1935, 78 y.o.   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  She switched care from [facility] to [facility] (P[facility]) in 05/2012.  Her simple spirometry in 2013 was 78% predicted.  HPI  12/23/2012 ROV -- [name] has been doing about the same since our last visit.  She says that her chest congestion may have picked up a bit since the last visit, but she has not had in sputum production. She has not had fevers chills, or weight loss. She did use the doxycycline once in the last few months for increasing congestion in her throat and said that this helped. She has not been using her flutter valve on a regular basis. She is only using Advair on an as-needed basis. She is still experiencing problems with stool incontinence. Past Medical History  Diagnosis Date  . Mitral valve prolapse   . Nonspecific abnormal electrocardiogram (ECG) (EKG)   . Fatigue   . Neck mass   . Recurrent UTI   . Fecal incontinence   . IBS (irritable bowel syndrome)   . Migraine   . Hypothyroidism   . Glaucoma(365)   . Hypertension   . GERD (gastroesophageal reflux disease)   . Diverticulosis of colon (without mention of hemorrhage)   . Depression   . Anxiety   . Allergic rhinitis   . Pulmonary nodules   . DOE (dyspnea on exertion)   . Hyperlipidemia   . Adenomatous polyp of colon 03/2007  . Polyp, sigmoid colon   . Bronchiectasis   . Blood transfusion   . Heart murmur   . Chronic kidney disease     kidney infection 5 years ago     Review of Systems Not performed 12/23/2012    Objective:   Physical Exam  Filed Vitals:   12/23/12 1329  BP: 142/80  Pulse: 75  Temp: 98 F (36.7 C)  TempSrc: Oral  Height: 5\' 8"  (1.727 m)  Weight: 161 lb (73.029 kg)  SpO2: 93%     Gen: well appearing, no acute distress HEENT: NCAT, EOMi, nasal mucosa clear; R ear canal  with slight bruising related to recent surgery PULM: CTA B CV: RRR, no mgr, no JVD AB: BS+, soft, nontender, no hsm Ext: warm, no edema, no clubbing, no cyanosis  05/2012 simple spirometry> > Ratio 63%, FEV1 1.98L (78% pred) 11/2012 CT chest [facility] >> bilateral bronchiectasis, scattered nodules no greater than 8mm in size    Assessment & Plan:   BRONCHIECTASIS W/ACUTE EXACERBATION [name] has been doing well since the last visit, but she has not been using her flutter valve or Advair.  Predictably, this has led to more chest congestion but not a flare of her bronchiectasis.   Plan: -instructed to use Advair and flutter valve regularly -if a "chest cold" (fever, cough, congestion, shortness of breath) then she needs antibiotics immediately and for at least 14 days. -doxycycline written to use if this occurs, instructed to call if she needs to take it so we can see her  Pulmonary nodules No change in size of nodules from 2009 through 11/2012, so malignancy exceedingly unlikely.  She may have MAI but does not appear to have active disease (minimal sputum production, no unexplained weight loss, fevers, or chills).  Plan: -no further imaging needed. -if recurrent sputum production, fevers, chills, develops then she needs to give [place]  three sputum samples for AFB   Updated Medication List Outpatient Encounter Prescriptions as of 12/23/2012  Medication Sig Dispense Refill  . acetaminophen (TYLENOL) 325 MG tablet Use as needed      . Alpha-D-Galactosidase (BEANO) TABS Take 1 tablet by mouth as needed.      . ALPRAZolam (XANAX) 1 MG tablet Take 1 tablet (1 mg total) by mouth at bedtime as needed.      Marland Kitchen aspirin 81 MG tablet Take 81 mg by mouth daily.        . brimonidine (ALPHAGAN P) 0.1 % SOLN Place 1 drop into the left eye 2 (two) times daily. As directed      . Calcium Carbonate-Vitamin D (CALTRATE 600+D) 600-400 MG-UNIT per tablet Take 1 tablet by mouth 2 (two) times daily.        .  cetirizine (ZYRTEC) 10 MG tablet Take 1 tablet (10 mg total) by mouth at bedtime.      . dorzolamide-timolol (COSOPT) 22.3-6.8 MG/ML ophthalmic solution Place 1 drop into the left eye 2 (two) times daily.        . Fluticasone-Salmeterol (ADVAIR DISKUS) 250-50 MCG/DOSE AEPB Inhale 1 puff into the lungs 2 (two) times daily.  60 each  5  . guaifenesin (HUMIBID E) 400 MG TABS 1-3 every 12 hours as needed      . HYDROcodone-acetaminophen (VICODIN) 5-500 MG per tablet 1 tablet every 6 (six) hours as needed for pain.      Marland Kitchen levothyroxine (SYNTHROID, LEVOTHROID) 25 MCG tablet Take 25 mcg by mouth daily.        . metoprolol tartrate (LOPRESSOR) 25 MG tablet Take 25 mg by mouth 2 (two) times daily.      . Multiple Vitamins-Minerals (MULTIVITAMIN WITH MINERALS) tablet Take 1 tablet by mouth daily.        Marland Kitchen nystatin-triamcinolone ointment (MYCOLOG) Apply topically 2 (two) times daily. To peraianal rash  30 g  1  . omeprazole (PRILOSEC) 40 MG capsule Take 40 mg by mouth daily.      . pravastatin (PRAVACHOL) 40 MG tablet Take 1 tablet (40 mg total) by mouth at bedtime.      . psyllium (METAMUCIL SMOOTH TEXTURE) 58.6 % powder 1 teaspoon by mouth once daily      . sertraline (ZOLOFT) 100 MG tablet Take 1 tablet (100 mg total) by mouth at bedtime. 2 at bedtime      . sodium chloride (OCEAN NASAL SPRAY) 0.65 % nasal spray Use as needed      . travoprost, benzalkonium, (TRAVATAN) 0.004 % ophthalmic solution Place 1 drop into the left eye at bedtime. As directed      . traZODone (DESYREL) 50 MG tablet Take 50 mg by mouth at bedtime.        . [DISCONTINUED] Fluticasone-Salmeterol (ADVAIR DISKUS IN) Inhale 1 puff into the lungs 2 (two) times daily as needed.      . doxycycline (VIBRA-TABS) 100 MG tablet 1 twice daily as needed w/ flutter      . Mometasone Furo-Formoterol Fum (DULERA) 100-5 MCG/ACT AERO Inhale 2 puffs into the lungs 2 (two) times daily.  1 Inhaler  3  . [DISCONTINUED] bifidobacterium infantis (ALIGN)  capsule Take 1 capsule by mouth daily.  28 capsule  0

## 2012-12-23 NOTE — Assessment & Plan Note (Addendum)
Christy Richardson has been doing well since the last visit, but she has not been using her flutter valve or Advair.  Predictably, this has led to more chest congestion but not a flare of her bronchiectasis.   Plan: -instructed to use Advair and flutter valve regularly -if a "chest cold" (fever, cough, congestion, shortness of breath) then she needs antibiotics immediately and for at least 14 days. -doxycycline written to use if this occurs, instructed to call if she needs to take it so we can see her

## 2012-12-23 NOTE — Patient Instructions (Signed)
Use your flutter valve 3 times daily Use the advair 1 puff twice a day If you develop a cold or increased chest congestion then use the doxycycline for 7 days.  If your symptoms don't improve or if you develop worsening abdominal pain and diarrhea on the antibiotic let us know  We will see you back in 6 months

## 2013-02-09 ENCOUNTER — Other Ambulatory Visit (HOSPITAL_COMMUNITY): Payer: Self-pay | Admitting: Internal Medicine

## 2013-02-12 ENCOUNTER — Ambulatory Visit (HOSPITAL_COMMUNITY)
Admission: RE | Admit: 2013-02-12 | Discharge: 2013-02-12 | Disposition: A | Discharge status: Home / Self Care | Payer: Medicare Other | Source: Ambulatory Visit | Attending: Internal Medicine | Admitting: Internal Medicine

## 2013-02-12 DIAGNOSIS — E041 Nontoxic single thyroid nodule: Secondary | ICD-10-CM | POA: Insufficient documentation

## 2013-02-12 DIAGNOSIS — E049 Nontoxic goiter, unspecified: Secondary | ICD-10-CM | POA: Insufficient documentation

## 2013-05-07 ENCOUNTER — Encounter: Payer: Self-pay | Admitting: Neurology

## 2013-05-07 ENCOUNTER — Ambulatory Visit (INDEPENDENT_AMBULATORY_CARE_PROVIDER_SITE_OTHER): Payer: Medicare Other | Admitting: Neurology

## 2013-05-07 VITALS — BP 139/87 | HR 71 | Ht 68.5 in | Wt 157.0 lb

## 2013-05-07 DIAGNOSIS — G250 Essential tremor: Secondary | ICD-10-CM

## 2013-05-07 MED ORDER — PRIMIDONE 50 MG PO TABS: 50.0000 mg | ORAL_TABLET | Freq: Four times a day (QID) | ORAL | Status: DC

## 2013-05-07 NOTE — Progress Notes (Signed)
[facility]  Provider:  Dr Machell Wirthlin Referring Provider: Catalina Pizza, MD Primary Care Physician:  Catalina Pizza, MD  Chief Complaint  Patient presents with  . New Evaluation    Hall, tremors, paper referral,rm 11    HPI:  Christy Richardson is a caucasian, right handed, widowed  78 y.o. female, seen here accompanied by her daughter following a referral from Dr. Catalina Pizza for evaluation of tremor.  The patient reports that she has noticed a change in her handwriting pattern and that the tremor bothers her mostly in her right hand, although it is visible in both.  She states she is not a very active person, but she feels often trembling , quivering and somewhat anxious deep  Inside . This is worse in the morning .She is used to having sometimes hypoglycemic events, she stated this is an almost similar feeling.  She suffers from bronchiectasis and  Uses inhalers, but still  has to cough and clear her airway frequently , but she has not noted a quivering in her voice .she also has not made a correlation between her medications and any worsening of tremor based on the intake time. She seems to suspect that your blood sugars may be a cause. She also acknowledged being a very anxious person.  She denies havingdysphagia, and she and her family have not noticed any change in her vocal modulation. She has reported tremors to be present during action and also sometimes at rest.  She believes that the tremor first became noticeable about a year ago and it  was her daughter, who  urged her to seek a neurologic consult.  She had one fall within the last year, but her shoe got caught on a wooden deck. She denies having any retropulsion or propulsion , when she goes shopping she pushes the cart and doesn't lean on it.  The patient is widowed and resides alone in her house but family members live close  by. She is accompanied today by one of her two daughters.  He has one living brother , is unsure  if he has tremors or not. Unaware ift her parents/ grandparents  had tremors.  Dr. Marcelo Baldy referral notes  include extensive medication and problem lists for this patient,  as well as her last laboratory tests from 03-30-2013.  This patient had normal liver function tests and creatinine  , sodium and potassium were normal . WBC was normal.    Her fasting Glucose the day of the test was 91 mg/dL.  Her red blood cell count and Hgb are slightly elevated. Her  Cholesterol was  in normal range, her thyroid-stimulating hormone and T4 in normal range, normal vitamin B12 levels and her vitamin D level was normal as well. Her Hgb A1c was 5.8- just 1 decimal point above normal.     Review of Systems: Out of a complete 14 system review, the patient complains of only the following symptoms, and all other reviewed systems are negative. The patient uses an Advair disc but noticed no increased tremor after use. She has Xanax prescribed when necessary and stated this calms her but she is unsure as the tremor is affected by it.  She has been depressed all her adult life and is currently on Zoloft 100 mg nightly she takes metoprolol for blood pressure.  History   Social History  . Marital Status: Widowed    Spouse Name: N/A    Number of Children: 2  . Years of Education:  10th grade   Occupational History  . Retired     Engineering geologist   Social History Main Topics  . Smoking status: Never Smoker   . Smokeless tobacco: Never Used  . Alcohol Use: No  . Drug Use: No  . Sexually Active: Not on file   Other Topics Concern  . Not on file   Social History Narrative   Widowed since 2002. Lives with her Daughter.    Family History  Problem Relation Age of Onset  . Diabetes Father   . Throat cancer Mother   . Coronary artery disease Mother   . Prostate cancer Brother   . Asthma Daughter   . Scoliosis Daughter   . Esophageal cancer Neg Hx   . Rectal cancer Neg Hx   . Stomach cancer Neg Hx   . Colon cancer  Paternal Grandfather     Past Medical History  Diagnosis Date  . Mitral valve prolapse   . Nonspecific abnormal electrocardiogram (ECG) (EKG)   . Fatigue   . Neck mass   . Recurrent UTI   . Fecal incontinence   . IBS (irritable bowel syndrome)   . Migraine   . Hypothyroidism   . Glaucoma   . Hypertension   . GERD (gastroesophageal reflux disease)   . Diverticulosis of colon (without mention of hemorrhage)   . Depression   . Anxiety   . Allergic rhinitis   . Pulmonary nodules   . DOE (dyspnea on exertion)   . Hyperlipidemia   . Adenomatous polyp of colon 03/2007  . Polyp, sigmoid colon   . Bronchiectasis   . Blood transfusion   . Heart murmur   . Diabetes mellitus without complication     Past Surgical History  Procedure Laterality Date  . Abdominal hysterectomy    . Oophorectomy    . Mastoidectomy      rt ear  . Total hip arthroplasty  5/10    left  . Colonoscopy    . Esophagogastroduodenoscopy      Current Outpatient Prescriptions  Medication Sig Dispense Refill  . acetaminophen (TYLENOL) 325 MG tablet Use as needed      . Alpha-D-Galactosidase (BEANO) TABS Take 1 tablet by mouth as needed.      . ALPRAZolam (XANAX) 1 MG tablet Take 1 tablet (1 mg total) by mouth at bedtime as needed.      Marland Kitchen aspirin 81 MG tablet Take 81 mg by mouth daily.        . brimonidine (ALPHAGAN P) 0.1 % SOLN Place 1 drop into the left eye 2 (two) times daily. As directed      . Calcium Carbonate-Vitamin D (CALTRATE 600+D) 600-400 MG-UNIT per tablet Take 1 tablet by mouth 2 (two) times daily.        . cetirizine (ZYRTEC) 10 MG tablet Take 1 tablet (10 mg total) by mouth at bedtime.      . dorzolamide-timolol (COSOPT) 22.3-6.8 MG/ML ophthalmic solution Place 1 drop into the left eye 2 (two) times daily.        Marland Kitchen doxycycline (VIBRA-TABS) 100 MG tablet 1 twice daily as needed w/ flutter      . Fluticasone-Salmeterol (ADVAIR DISKUS) 250-50 MCG/DOSE AEPB Inhale 1 puff into the lungs 2 (two)  times daily.  60 each  5  . guaifenesin (HUMIBID E) 400 MG TABS 1-3 every 12 hours as needed      . HYDROcodone-acetaminophen (VICODIN) 5-500 MG per tablet 1 tablet every 6 (six) hours as needed  for pain.      Marland Kitchen levothyroxine (SYNTHROID, LEVOTHROID) 25 MCG tablet Take 25 mcg by mouth daily.        . metoprolol tartrate (LOPRESSOR) 25 MG tablet Take 25 mg by mouth 2 (two) times daily.      . Mometasone Furo-Formoterol Fum (DULERA) 100-5 MCG/ACT AERO Inhale 2 puffs into the lungs 2 (two) times daily.  1 Inhaler  3  . Multiple Vitamins-Minerals (MULTIVITAMIN WITH MINERALS) tablet Take 1 tablet by mouth daily.        Marland Kitchen nystatin-triamcinolone ointment (MYCOLOG) Apply topically 2 (two) times daily. To peraianal rash  30 g  1  . omeprazole (PRILOSEC) 40 MG capsule Take 40 mg by mouth daily.      . pravastatin (PRAVACHOL) 40 MG tablet Take 1 tablet (40 mg total) by mouth at bedtime.      . psyllium (METAMUCIL SMOOTH TEXTURE) 58.6 % powder 1 teaspoon by mouth once daily      . sertraline (ZOLOFT) 100 MG tablet Take 1 tablet (100 mg total) by mouth at bedtime. 2 at bedtime      . sodium chloride (OCEAN NASAL SPRAY) 0.65 % nasal spray Use as needed      . travoprost, benzalkonium, (TRAVATAN) 0.004 % ophthalmic solution Place 1 drop into the left eye at bedtime. As directed      . dorzolamide (TRUSOPT) 2 % ophthalmic solution       . latanoprost (XALATAN) 0.005 % ophthalmic solution       . meloxicam (MOBIC) 15 MG tablet       . methocarbamol (ROBAXIN) 750 MG tablet       . traZODone (DESYREL) 50 MG tablet Take 50 mg by mouth at bedtime.         No current facility-administered medications for this visit.    Allergies as of 05/07/2013 - Review Complete 05/07/2013  Allergen Reaction Noted  . Penicillins  01/07/2007  . Prednisone  03/17/2009  . Sulfonamide derivatives  01/07/2007    Vitals: BP 139/87  Pulse 71  Ht 5' 8.5" (1.74 m)  Wt 157 lb (71.215 kg)  BMI 23.52 kg/m2 Last Weight:  Wt  Readings from Last 1 Encounters:  05/07/13 157 lb (71.215 kg)   Last Height:   Ht Readings from Last 1 Encounters:  05/07/13 5' 8.5" (1.74 m)   Vision Screening:  See vitals. Physical exam:  General: The patient is awake, alert and appears not in acute distress. The patient is well groomed. Head: Normocephalic, atraumatic. Neck is supple. Mallampati1 neck circumference:13.5  Cardiovascular:  Regular rate and rhythm,  Has intermittent extra beats , with click  murmurs , but no carotid bruit, and without distended neck veins. Respiratory: Lungs are clear to auscultation. Skin:  Without evidence of edema, or rash Trunk: BMI is normal, as is  posture.  Neurologic exam : The patient is awake and alert, oriented to place and time.  Memory subjective  described as intact. There is a normal attention span & concentration ability. Speech is fluent without dysarthria, dysphonia or aphasia. Mood and affect are depressed.   Cranial nerves: Pupils are equal and briskly reactive to light. Funduscopic exam - glaucoma . Extraocular movements  in vertical and horizontal planes intact and without nystagmus . Visual fields by finger perimetry are intact. 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 Facial motor strength is symmetric and tongue and uvula move  midline.  Motor exam:   Normal tone and normal muscle bulk and symmetric normal strength in all extremities.  Sensory:  Fine touch, pinprick and vibration were tested in all extremities. Proprioception is tested in the upper extremities only. This was  normal.  Coordination: Rapid alternating movements in the fingers/hands is tested and normal. Finger-to-nose maneuver tested , tremor is noted bilaterally. She is somewhat abrupt .  Gait and station: Patient walks without assistive device , climbs up to the exam table. Strength within normal limits. Stance is stable and normal. Steps are  unfragmented. Romberg testing is normal.  Deep tendon reflexes: in the  upper and lower extremities are symmetric and intact. Babinski maneuver response is barely down going- equivocal..   Assessment:  After physical and neurologic examination, review of laboratory studies, imaging, neurophysiology testing and pre-existing records, assessment will be reviewed on the problem list.  By physical exam shows very little evidence of a possible Parkinson's disease which of course is one of the main fears of the patient's during the evaluation.she has not cogwheeling, but tremor is bilaterally present, it is present at rest as well as during action, it does not have a pill rolling character, she walks with normal arm swing and has a very good step width.  There is also no evidence of gait instability or postural instability except when she first gets up in the morning and feels trembling inside. I would like for her to start on Primidone , for 8 days at night and then to take the tablet in the morning once her metabolism has gotten used to it. After another week I would like her to take it twice a day to control tremor.  This patient takes a beta blocker ( metoprolol ) for blood pressure and possibly heart rate control, but in light of her bronchiectasis, I did not want to change it to propranolol without Dr. Scharlene Gloss approval.  Given her age, I am also concerned that topiramate would not be a primary option as it can cause some memory trouble.  Plan: will be reviewed under Problem List.

## 2013-05-07 NOTE — Patient Instructions (Signed)
Depression, Adult Depression is feeling sad, low, down in the dumps, blue, gloomy, or empty. In general, there are two kinds of depression:  Normal sadness or grief. This can happen after something upsetting. It often goes away on its own within 2 weeks. After losing a loved one (bereavement), normal sadness and grief may last longer than two weeks. It usually gets better with time.  Clinical depression. This kind lasts longer than normal sadness or grief. It keeps you from doing the things you normally do in life. It is often hard to function at home, work, or at school. It may affect your relationships with others. Treatment is often needed. GET HELP RIGHT AWAY IF:  You have thoughts about hurting yourself or others.  You lose touch with reality (psychotic symptoms). You may:  See or hear things that are not real.  Have untrue beliefs about your life or people around you.  Your medicine is giving you problems. MAKE SURE YOU:  Understand these instructions.  Will watch your condition.  Will get help right away if you are not doing well or get worse. Document Released: 12/22/2010 Document Revised: 08/13/2012 Document Reviewed: 12/22/2010 [facility] Patient Information 2014 [place], [place]. Tremor Tremor is a rhythmic, involuntary muscular contraction characterized by oscillations (to-and-fro movements) of a part of the body. The most common of all involuntary movements, tremor can affect various body parts such as the hands, head, facial structures, vocal cords, trunk, and legs; most tremors, however, occur in the hands. Tremor often accompanies neurological disorders associated with aging. Although the disorder is not life-threatening, it can be responsible for functional disability and social embarrassment. TREATMENT  There are many types of tremor and several ways in which tremor is classified. The most common classification is by behavioral context or position. There are five  categories of tremor within this classification: resting, postural, kinetic, task-specific, and psychogenic. Resting or static tremor occurs when the muscle is at rest, for example when the hands are lying on the lap. This type of tremor is often seen in patients with Parkinson's disease. Postural tremor occurs when a patient attempts to maintain posture, such as holding the hands outstretched. Postural tremors include physiological tremor, essential tremor, tremor with basal ganglia disease (also seen in patients with Parkinson's disease), cerebellar postural tremor, tremor with peripheral neuropathy, post-traumatic tremor, and alcoholic tremor. Kinetic or intention (action) tremor occurs during purposeful movement, for example during finger-to-nose testing. Task-specific tremor appears when performing goal-oriented tasks such as handwriting, speaking, or standing. This group consists of primary writing tremor, vocal tremor, and orthostatic tremor. Psychogenic tremor occurs in both older and younger patients. The key feature of this tremor is that it dramatically lessens or disappears when the patient is distracted. PROGNOSIS There are some treatment options available for tremor; the appropriate treatment depends on accurate diagnosis of the cause. Some tremors respond to treatment of the underlying condition, for example in some cases of psychogenic tremor treating the patient's underlying mental problem may cause the tremor to disappear. Also, patients with tremor due to Parkinson's disease may be treated with Levodopa drug therapy. Symptomatic drug therapy is available for several other tremors as well. For those cases of tremor in which there is no effective drug treatment, physical measures such as teaching the patient to brace the affected limb during the tremor are sometimes useful. Surgical intervention such as thalamotomy or deep brain stimulation may be useful in certain cases. Document Released:  11/09/2002 Document Revised: 02/11/2012 Document Reviewed: 11/19/2005 [employer]  Patient Information 2014 [employer].  

## 2013-05-07 NOTE — Assessment & Plan Note (Signed)
Essential or familial tremor. Begin this might [place] also known as permit on 1 doors at night for 8 nights, then convert to a half tablet in the morning and if tolerated and half tablet in the early afternoon for daytime tremor control.

## 2013-05-08 ENCOUNTER — Telehealth: Payer: Self-pay | Admitting: Neurology

## 2013-05-08 DIAGNOSIS — G250 Essential tremor: Secondary | ICD-10-CM

## 2013-05-08 NOTE — Telephone Encounter (Signed)
Patient isn't sure she'll have enough medication to last til Sept.

## 2013-05-08 NOTE — Telephone Encounter (Signed)
Need to clarify dose with Physician on Monday.  Patient has #30 tabs currently, will be okay through the weekend.

## 2013-05-08 NOTE — Telephone Encounter (Signed)
Pt called states she was here yesterday but is concerned about her prescription primidone (MYSOLINE) 50 MG tablet she is not for sure how to take it because if she takes it like it says than pt states she will run out by the end of the week I advised her there were 3 refills and she understood that but now she is saying that she won't have enough refills before her next apt in Sept. Can someone call her and advise her on this? Thanks

## 2013-05-11 MED ORDER — PRIMIDONE 50 MG PO TABS: 50.0000 mg | ORAL_TABLET | Freq: Two times a day (BID) | ORAL | Status: DC

## 2013-05-11 NOTE — Telephone Encounter (Signed)
I verified dose with Dr Vickey Huger.  Patient should ultimately take med bid once she titrate's up.  I have sent an updated Rx.  I called the patient back.  Explained she would not be taking med QID, she would be taking it BID.  She verbalized understanding.  Will call back if needed.

## 2013-06-25 ENCOUNTER — Other Ambulatory Visit (HOSPITAL_COMMUNITY): Payer: Self-pay | Admitting: Internal Medicine

## 2013-06-25 DIAGNOSIS — Z139 Encounter for screening, unspecified: Secondary | ICD-10-CM

## 2013-06-30 ENCOUNTER — Ambulatory Visit (HOSPITAL_COMMUNITY)
Admission: RE | Admit: 2013-06-30 | Discharge: 2013-06-30 | Disposition: A | Discharge status: Home / Self Care | Payer: Medicare Other | Source: Ambulatory Visit | Attending: Internal Medicine | Admitting: Internal Medicine

## 2013-06-30 DIAGNOSIS — Z139 Encounter for screening, unspecified: Secondary | ICD-10-CM

## 2013-06-30 DIAGNOSIS — Z1231 Encounter for screening mammogram for malignant neoplasm of breast: Secondary | ICD-10-CM | POA: Insufficient documentation

## 2013-08-07 ENCOUNTER — Ambulatory Visit: Payer: Medicare Other | Admitting: Neurology

## 2014-01-18 ENCOUNTER — Emergency Department (HOSPITAL_COMMUNITY): Payer: Medicare Other

## 2014-01-18 ENCOUNTER — Emergency Department (HOSPITAL_COMMUNITY)
Admission: EM | Admit: 2014-01-18 | Discharge: 2014-01-19 | Disposition: A | Discharge status: Home / Self Care | Payer: Medicare Other | Attending: Emergency Medicine | Admitting: Emergency Medicine

## 2014-01-18 ENCOUNTER — Encounter (HOSPITAL_COMMUNITY): Payer: Self-pay | Admitting: Emergency Medicine

## 2014-01-18 DIAGNOSIS — Z87440 Personal history of urinary (tract) infections: Secondary | ICD-10-CM | POA: Insufficient documentation

## 2014-01-18 DIAGNOSIS — F3289 Other specified depressive episodes: Secondary | ICD-10-CM | POA: Insufficient documentation

## 2014-01-18 DIAGNOSIS — E039 Hypothyroidism, unspecified: Secondary | ICD-10-CM | POA: Insufficient documentation

## 2014-01-18 DIAGNOSIS — Z79899 Other long term (current) drug therapy: Secondary | ICD-10-CM | POA: Insufficient documentation

## 2014-01-18 DIAGNOSIS — E119 Type 2 diabetes mellitus without complications: Secondary | ICD-10-CM | POA: Insufficient documentation

## 2014-01-18 DIAGNOSIS — F411 Generalized anxiety disorder: Secondary | ICD-10-CM | POA: Insufficient documentation

## 2014-01-18 DIAGNOSIS — Z880 Allergy status to penicillin: Secondary | ICD-10-CM | POA: Insufficient documentation

## 2014-01-18 DIAGNOSIS — R0789 Other chest pain: Secondary | ICD-10-CM | POA: Insufficient documentation

## 2014-01-18 DIAGNOSIS — F329 Major depressive disorder, single episode, unspecified: Secondary | ICD-10-CM | POA: Insufficient documentation

## 2014-01-18 DIAGNOSIS — IMO0002 Reserved for concepts with insufficient information to code with codable children: Secondary | ICD-10-CM | POA: Insufficient documentation

## 2014-01-18 DIAGNOSIS — J111 Influenza due to unidentified influenza virus with other respiratory manifestations: Secondary | ICD-10-CM | POA: Insufficient documentation

## 2014-01-18 DIAGNOSIS — Z8719 Personal history of other diseases of the digestive system: Secondary | ICD-10-CM | POA: Insufficient documentation

## 2014-01-18 DIAGNOSIS — I059 Rheumatic mitral valve disease, unspecified: Secondary | ICD-10-CM | POA: Insufficient documentation

## 2014-01-18 DIAGNOSIS — E785 Hyperlipidemia, unspecified: Secondary | ICD-10-CM | POA: Insufficient documentation

## 2014-01-18 DIAGNOSIS — H409 Unspecified glaucoma: Secondary | ICD-10-CM | POA: Insufficient documentation

## 2014-01-18 DIAGNOSIS — Z7982 Long term (current) use of aspirin: Secondary | ICD-10-CM | POA: Insufficient documentation

## 2014-01-18 DIAGNOSIS — I10 Essential (primary) hypertension: Secondary | ICD-10-CM | POA: Insufficient documentation

## 2014-01-18 DIAGNOSIS — Z85038 Personal history of other malignant neoplasm of large intestine: Secondary | ICD-10-CM | POA: Insufficient documentation

## 2014-01-18 DIAGNOSIS — Z791 Long term (current) use of non-steroidal anti-inflammatories (NSAID): Secondary | ICD-10-CM | POA: Insufficient documentation

## 2014-01-18 HISTORY — DX: Tremor, unspecified: R25.1

## 2014-01-18 LAB — CBC WITH DIFFERENTIAL/PLATELET
BASOS PCT: 0 % (ref 0–1)
Basophils Absolute: 0 10*3/uL (ref 0.0–0.1)
EOS ABS: 0.3 10*3/uL (ref 0.0–0.7)
Eosinophils Relative: 4 % (ref 0–5)
HCT: 42.7 % (ref 36.0–46.0)
HEMOGLOBIN: 14.7 g/dL (ref 12.0–15.0)
Lymphocytes Relative: 3 % — ABNORMAL LOW (ref 12–46)
Lymphs Abs: 0.2 10*3/uL — ABNORMAL LOW (ref 0.7–4.0)
MCH: 30.6 pg (ref 26.0–34.0)
MCHC: 34.4 g/dL (ref 30.0–36.0)
MCV: 88.8 fL (ref 78.0–100.0)
MONOS PCT: 5 % (ref 3–12)
Monocytes Absolute: 0.4 10*3/uL (ref 0.1–1.0)
Neutro Abs: 6.6 10*3/uL (ref 1.7–7.7)
Neutrophils Relative %: 88 % — ABNORMAL HIGH (ref 43–77)
PLATELETS: 180 10*3/uL (ref 150–400)
RBC: 4.81 MIL/uL (ref 3.87–5.11)
RDW: 12.6 % (ref 11.5–15.5)
WBC: 7.5 10*3/uL (ref 4.0–10.5)

## 2014-01-18 LAB — BASIC METABOLIC PANEL
BUN: 16 mg/dL (ref 6–23)
CALCIUM: 9.1 mg/dL (ref 8.4–10.5)
CO2: 24 mEq/L (ref 19–32)
Chloride: 102 mEq/L (ref 96–112)
Creatinine, Ser: 0.85 mg/dL (ref 0.50–1.10)
GFR calc Af Amer: 75 mL/min — ABNORMAL LOW (ref >90)
GFR, EST NON AFRICAN AMERICAN: 65 mL/min — AB (ref >90)
GLUCOSE: 128 mg/dL — AB (ref 70–99)
Potassium: 4 mEq/L (ref 3.7–5.3)
Sodium: 138 mEq/L (ref 137–147)

## 2014-01-18 MED ORDER — IPRATROPIUM-ALBUTEROL 0.5-2.5 (3) MG/3ML IN SOLN
3.0000 mL | Freq: Once | RESPIRATORY_TRACT | Status: AC
  Administered 2014-01-18: 3 mL via RESPIRATORY_TRACT
  Filled 2014-01-18: qty 3

## 2014-01-18 NOTE — ED Provider Notes (Signed)
CSN: [id number]     Arrival date & time 01/18/14  2043 History  This chart was scribed for Delora Fuel, MD by Roxan Diesel, ED scribe.  This patient was seen in room APA16A/APA16A and the patient's care was started at 11:11 PM.   Chief Complaint  Patient presents with  . Generalized Body Aches  . Hypertension    The history is provided by the patient. No language interpreter was used.    HPI Comments: Christy Richardson is a 79 y.o. female with h/o HTN, bronchiectasis, DM, mitral valve prolapse, IBS, hypothyroidism, and hyperlipidemia who presents to the Emergency Department complaining of HTN that she measured today, with associated headache, nausea, and chest tightness.  Pt states that shortly after lunch today she developed a headache and chills.  She denies measured fever.  Later this afternoon she checked her BP and found it was elevated at 158/110.  Presently pt also reports feeling "shaky," nauseated, and "feels like something's coming up in my throat."  She also reports that her chest feels "tight" and her legs feel "hot" and "achy."  She admits to chronic cough and SOB but denies changes today.  She denies vomiting or diaphoresis.  She has attempted to treat headache with Aleve, without relief.  Pt was in a hospital last week visiting her daughter but denies any known recent sick contacts.  She received a flu shot this year.    Past Medical History  Diagnosis Date  . Mitral valve prolapse   . Nonspecific abnormal electrocardiogram (ECG) (EKG)   . Fatigue   . Neck mass   . Recurrent UTI   . Fecal incontinence   . IBS (irritable bowel syndrome)   . Migraine   . Hypothyroidism   . Glaucoma   . Hypertension   . GERD (gastroesophageal reflux disease)   . Diverticulosis of colon (without mention of hemorrhage)   . Depression   . Anxiety   . Allergic rhinitis   . Pulmonary nodules   . DOE (dyspnea on exertion)   . Hyperlipidemia   . Adenomatous polyp of colon 03/2007  . Polyp,  sigmoid colon   . Bronchiectasis   . Blood transfusion   . Heart murmur   . Diabetes mellitus without complication   . Tremor     Past Surgical History  Procedure Laterality Date  . Abdominal hysterectomy    . Oophorectomy    . Mastoidectomy      rt ear  . Total hip arthroplasty  5/10    left  . Colonoscopy    . Esophagogastroduodenoscopy      Family History  Problem Relation Age of Onset  . Diabetes Father   . Throat cancer Mother   . Coronary artery disease Mother   . Prostate cancer Brother   . Asthma Daughter   . Scoliosis Daughter   . Esophageal cancer Neg Hx   . Rectal cancer Neg Hx   . Stomach cancer Neg Hx   . Colon cancer Paternal Grandfather     History  Substance Use Topics  . Smoking status: Never Smoker   . Smokeless tobacco: Never Used  . Alcohol Use: No    OB History   Grav Para Term Preterm Abortions TAB SAB Ect Mult Living                   Review of Systems  All other systems reviewed and are negative.      Allergies  Penicillins; Prednisone; and  Sulfonamide derivatives  Home Medications   Current Outpatient Rx  Name  Route  Sig  Dispense  Refill  . Alpha-D-Galactosidase (BEANO) TABS   Oral   Take 1 tablet by mouth as needed (for gas).          . ALPRAZolam (XANAX) 1 MG tablet   Oral   Take 1 tablet (1 mg total) by mouth at bedtime as needed.         Marland Kitchen aspirin 81 MG tablet   Oral   Take 81 mg by mouth daily.           . cetirizine (ZYRTEC) 10 MG tablet   Oral   Take 10 mg by mouth daily.          . dorzolamide-timolol (COSOPT) 22.3-6.8 MG/ML ophthalmic solution   Left Eye   Place 1 drop into the left eye 2 (two) times daily.           . Fluticasone-Salmeterol (ADVAIR DISKUS) 250-50 MCG/DOSE AEPB   Inhalation   Inhale 1 puff into the lungs 2 (two) times daily.   60 each   5   . HYDROcodone-acetaminophen (VICODIN) 5-500 MG per tablet      1 tablet every 6 (six) hours as needed for pain.         Marland Kitchen  levothyroxine (SYNTHROID, LEVOTHROID) 50 MCG tablet   Oral   Take 50 mcg by mouth daily before breakfast.         . losartan (COZAAR) 100 MG tablet   Oral   Take 1 tablet by mouth daily.         . meloxicam (MOBIC) 7.5 MG tablet   Oral   Take 7.5 mg by mouth 2 (two) times daily.         . methocarbamol (ROBAXIN) 750 MG tablet   Oral   Take 750 mg by mouth daily as needed for muscle spasms.          . metoprolol tartrate (LOPRESSOR) 25 MG tablet   Oral   Take 25 mg by mouth 2 (two) times daily.         . Multiple Vitamins-Minerals (MULTIVITAMIN WITH MINERALS) tablet   Oral   Take 1 tablet by mouth daily.           . naproxen (NAPROSYN) 250 MG tablet   Oral   Take 250 mg by mouth daily as needed for moderate pain.         . pravastatin (PRAVACHOL) 40 MG tablet   Oral   Take 1 tablet (40 mg total) by mouth at bedtime.         . sertraline (ZOLOFT) 100 MG tablet   Oral   Take 1 tablet (100 mg total) by mouth at bedtime. 2 at bedtime         . sodium chloride (OCEAN NASAL SPRAY) 0.65 % nasal spray   Each Nare   Place 2 sprays into both nostrils at bedtime. Use as needed         . traZODone (DESYREL) 50 MG tablet   Oral   Take 50 mg by mouth at bedtime.            BP 155/83  Pulse 114  Temp(Src) 98.9 F (37.2 C) (Oral)  Resp 20  Ht 5\' 8"  (1.727 m)  Wt 150 lb (68.04 kg)  BMI 22.81 kg/m2  SpO2 95%  Physical Exam  Nursing note and vitals reviewed. Constitutional: She is oriented to person,  place, and time. She appears well-developed and well-nourished. No distress.  HENT:  Head: Normocephalic and atraumatic.  Eyes: EOM are normal.  Neck: Neck supple. No tracheal deviation present.  Cardiovascular: Normal rate.   Pulmonary/Chest: Effort normal. No respiratory distress. She has rales.  Prolonged exhalation phase Few rales present right base  Musculoskeletal: Normal range of motion.  Neurological: She is alert and oriented to person, place,  and time.  Skin: Skin is warm and dry.  Psychiatric: She has a normal mood and affect. Her behavior is normal.    ED Course  Procedures (including critical care time)  DIAGNOSTIC STUDIES: Oxygen Saturation is 95% on room air, adequate by my interpretation.    COORDINATION OF CARE: 11:18 PM-Discussed treatment plan which includes breathing treatment, labs, and CXR with pt at bedside and pt agreed to plan.     Labs Review Results for orders placed during the hospital encounter of 53/64/68  BASIC METABOLIC PANEL      Result Value Ref Range   Sodium 138  137 - 147 mEq/L   Potassium 4.0  3.7 - 5.3 mEq/L   Chloride 102  96 - 112 mEq/L   CO2 24  19 - 32 mEq/L   Glucose, Bld 128 (*) 70 - 99 mg/dL   BUN 16  6 - 23 mg/dL   Creatinine, Ser 0.85  0.50 - 1.10 mg/dL   Calcium 9.1  8.4 - 10.5 mg/dL   GFR calc non Af Amer 65 (*) >90 mL/min   GFR calc Af Amer 75 (*) >90 mL/min  CBC WITH DIFFERENTIAL      Result Value Ref Range   WBC 7.5  4.0 - 10.5 K/uL   RBC 4.81  3.87 - 5.11 MIL/uL   Hemoglobin 14.7  12.0 - 15.0 g/dL   HCT 42.7  36.0 - 46.0 %   MCV 88.8  78.0 - 100.0 fL   MCH 30.6  26.0 - 34.0 pg   MCHC 34.4  30.0 - 36.0 g/dL   RDW 12.6  11.5 - 15.5 %   Platelets 180  150 - 400 K/uL   Neutrophils Relative % 88 (*) 43 - 77 %   Neutro Abs 6.6  1.7 - 7.7 K/uL   Lymphocytes Relative 3 (*) 12 - 46 %   Lymphs Abs 0.2 (*) 0.7 - 4.0 K/uL   Monocytes Relative 5  3 - 12 %   Monocytes Absolute 0.4  0.1 - 1.0 K/uL   Eosinophils Relative 4  0 - 5 %   Eosinophils Absolute 0.3  0.0 - 0.7 K/uL   Basophils Relative 0  0 - 1 %   Basophils Absolute 0.0  0.0 - 0.1 K/uL   Imaging Review Dg Chest 2 View  01/18/2014   CLINICAL DATA:  Generalized weakness.  Body aches.  Hypertension.  EXAM: CHEST  2 VIEW  COMPARISON:  09/19/2011  FINDINGS: Elevated right hemidiaphragm. Lungs are clear. Heart is normal size. No effusions. No acute bony abnormality.  IMPRESSION: No active cardiopulmonary disease.    Electronically Signed   By: Rolm Baptise M.D.   On: 01/18/2014 23:49   Images viewed by me.  EKG Interpretation    Date/Time:  Monday January 18 2014 23:04:28 EST Ventricular Rate:  82 PR Interval:  158 QRS Duration: 76 QT Interval:  364 QTC Calculation: 425 R Axis:   -7 Text Interpretation:  Normal sinus rhythm Nonspecific ST abnormality Abnormal ECG No previous ECGs available Confirmed by Roxanne Mins  MD, Kaysan Peixoto (  [id number]) on 01/18/2014 11:09:40 PM            MDM   Final diagnoses:  Influenza    Chills and aching which seems most likely to be due to a viral illness, possibly influenza. She'll be given a therapeutic trial of albuterol with ipratropium. Chest x-ray and CBC will be obtained.  There is some improvement in her chest tightness with albuterol with ipratropium. Chest x-ray shows no evidence of pneumonia. WBC is normal although with a slight left shift. Her main complaint now is currently she has acid in her throat. She'll be given a GI cocktail.  She feels much better following GI cocktail. She states that she had stopped taking Zantac and Prilosec because of concern of long-term effects of this. Advised her to go back to taking them for about the next 2 weeks. She is given a prescription for oseltamivir to treat possible influenza, and is also given an albuterol inhaler.   I personally performed the services described in this documentation, which was scribed in my presence. The recorded information has been reviewed and is accurate.     Delora Fuel, MD [phone] [id number]

## 2014-01-18 NOTE — ED Notes (Signed)
Around lunch time, I started feeling bad, having chills, feel like my food wants to come back up, my blood pressure was high and my pulse was 110. I am short of breath, burping a lot per pt. My chest doesn't feel real well per pt.

## 2014-01-18 NOTE — ED Notes (Signed)
Patient placed on continuous cardiac monitoring, continuous pulse 0x monitoring 

## 2014-01-19 MED ORDER — GI COCKTAIL ~~LOC~~
30.0000 mL | Freq: Once | ORAL | Status: AC
  Administered 2014-01-19: 30 mL via ORAL
  Filled 2014-01-19: qty 30

## 2014-01-19 MED ORDER — OSELTAMIVIR PHOSPHATE 75 MG PO CAPS: 75.0000 mg | ORAL_CAPSULE | Freq: Two times a day (BID) | ORAL | Status: DC

## 2014-01-19 MED ORDER — ALBUTEROL SULFATE HFA 108 (90 BASE) MCG/ACT IN AERS
2.0000 | INHALATION_SPRAY | RESPIRATORY_TRACT | Status: DC | PRN
  Filled 2014-01-19: qty 6.7

## 2014-01-19 MED ORDER — OSELTAMIVIR PHOSPHATE 75 MG PO CAPS
75.0000 mg | ORAL_CAPSULE | Freq: Once | ORAL | Status: AC
  Administered 2014-01-19: 75 mg via ORAL
  Filled 2014-01-19: qty 1

## 2014-01-19 NOTE — Discharge Instructions (Signed)
Influenza, Adult Influenza ("the flu") is a viral infection of the respiratory tract. It occurs more often in winter months because people spend more time in close contact with one another. Influenza can make you feel very sick. Influenza easily spreads from person to person (contagious). CAUSES  Influenza is caused by a virus that infects the respiratory tract. You can catch the virus by breathing in droplets from an infected person's cough or sneeze. You can also catch the virus by touching something that was recently contaminated with the virus and then touching your mouth, nose, or eyes. SYMPTOMS  Symptoms typically last 4 to 10 days and may include:  Fever.  Chills.  Headache, body aches, and muscle aches.  Sore throat.  Chest discomfort and cough.  Poor appetite.  Weakness or feeling tired.  Dizziness.  Nausea or vomiting. DIAGNOSIS  Diagnosis of influenza is often made based on your history and a physical exam. A nose or throat swab test can be done to confirm the diagnosis. RISKS AND COMPLICATIONS You may be at risk for a more severe case of influenza if you smoke cigarettes, have diabetes, have chronic heart disease (such as heart failure) or lung disease (such as asthma), or if you have a weakened immune system. Elderly people and pregnant women are also at risk for more serious infections. The most common complication of influenza is a lung infection (pneumonia). Sometimes, this complication can require emergency medical care and may be life-threatening. PREVENTION  An annual influenza vaccination (flu shot) is the best way to avoid getting influenza. An annual flu shot is now routinely recommended for all adults in the [place] TREATMENT  In mild cases, influenza goes away on its own. Treatment is directed at relieving symptoms. For more severe cases, your caregiver may prescribe antiviral medicines to shorten the sickness. Antibiotic medicines are not effective, because the  infection is caused by a virus, not by bacteria. HOME CARE INSTRUCTIONS  Only take over-the-counter or prescription medicines for pain, discomfort, or fever as directed by your caregiver.  Use a cool mist humidifier to make breathing easier.  Get plenty of rest until your temperature returns to normal. This usually takes 3 to 4 days.  Drink enough fluids to keep your urine clear or pale yellow.  Cover your mouth and nose when coughing or sneezing, and wash your hands well to avoid spreading the virus.  Stay home from work or school until your fever has been gone for at least 1 full day. SEEK MEDICAL CARE IF:   You have chest pain or a deep cough that worsens or produces more mucus.  You have nausea, vomiting, or diarrhea. SEEK IMMEDIATE MEDICAL CARE IF:   You have difficulty breathing, shortness of breath, or your skin or nails turn bluish.  You have severe neck pain or stiffness.  You have a severe headache, facial pain, or earache.  You have a worsening or recurring fever.  You have nausea or vomiting that cannot be controlled. MAKE SURE YOU:  Understand these instructions.  Will watch your condition.  Will get help right away if you are not doing well or get worse. Document Released: 11/16/2000 Document Revised: 05/20/2012 Document Reviewed: 02/18/2012 [facility] Patient Information 2014 [place], [place].  Albuterol inhalation aerosol What is this medicine? ALBUTEROL (al [place]) is a bronchodilator. It helps open up the airways in your lungs to make it easier to breathe. This medicine is used to treat and to prevent bronchospasm. This medicine may be  used for other purposes; ask your health care provider or pharmacist if you have questions. COMMON BRAND NAME(S): Proair HFA, Proventil HFA, Proventil, Respirol , Ventolin HFA, Ventolin What should I tell my health care provider before I take this medicine? They need to know if you have any of the following  conditions: -diabetes -heart disease or irregular heartbeat -high blood pressure -pheochromocytoma -seizures -thyroid disease -an unusual or allergic reaction to albuterol, levalbuterol, sulfites, other medicines, foods, dyes, or preservatives -pregnant or trying to get pregnant -breast-feeding How should I use this medicine? This medicine is for inhalation through the mouth. Follow the directions on your prescription label. Take your medicine at regular intervals. Do not use more often than directed. Make sure that you are using your inhaler correctly. Ask you doctor or health care provider if you have any questions. Talk to your pediatrician regarding the use of this medicine in children. Special care may be needed. Overdosage: If you think you have taken too much of this medicine contact a poison control center or emergency room at once. NOTE: This medicine is only for you. Do not share this medicine with others. What if I miss a dose? If you miss a dose, use it as soon as you can. If it is almost time for your next dose, use only that dose. Do not use double or extra doses. What may interact with this medicine? -anti-infectives like chloroquine and pentamidine -caffeine -cisapride -diuretics -medicines for colds -medicines for depression or for emotional or psychotic conditions -medicines for weight loss including some herbal products -methadone -some antibiotics like clarithromycin, erythromycin, levofloxacin, and linezolid -some heart medicines -steroid hormones like dexamethasone, cortisone, hydrocortisone -theophylline -thyroid hormones This list may not describe all possible interactions. Give your health care provider a list of all the medicines, herbs, non-prescription drugs, or dietary supplements you use. Also tell them if you smoke, drink alcohol, or use illegal drugs. Some items may interact with your medicine. What should I watch for while using this medicine? Tell  your doctor or health care professional if your symptoms do not improve. Do not use extra albuterol. If your asthma or bronchitis gets worse while you are using this medicine, call your doctor right away. If your mouth gets dry try chewing sugarless gum or sucking hard candy. Drink water as directed. What side effects may I notice from receiving this medicine? Side effects that you should report to your doctor or health care professional as soon as possible: -allergic reactions like skin rash, itching or hives, swelling of the face, lips, or tongue -breathing problems -chest pain -feeling faint or lightheaded, falls -high blood pressure -irregular heartbeat -fever -muscle cramps or weakness -pain, tingling, numbness in the hands or feet -vomiting Side effects that usually do not require medical attention (report to your doctor or health care professional if they continue or are bothersome): -cough -difficulty sleeping -headache -nervousness or trembling -stomach upset -stuffy or runny nose -throat irritation -unusual taste This list may not describe all possible side effects. Call your doctor for medical advice about side effects. You may report side effects to FDA at 1-800-FDA-1088. Where should I keep my medicine? Keep out of the reach of children. Store at room temperature between 15 and 30 degrees C (59 and 86 degrees F). The contents are under pressure and may burst when exposed to heat or flame. Do not freeze. This medicine does not work as well if it is too cold. Throw away any unused medicine after the  expiration date. Inhalers need to be thrown away after the labeled number of puffs have been used or by the expiration date; whichever comes first. Ventolin HFA should be thrown away 12 months after removing from foil pouch. Check the instructions that come with your medicine. NOTE: This sheet is a summary. It may not cover all possible information. If you have questions about this  medicine, talk to your doctor, pharmacist, or health care provider.  2014, [employer]. (2013-05-07 10:57:17)  Oseltamivir capsules What is this medicine? OSELTAMIVIR (os el TAM i vir) is an antiviral medicine. It is used to prevent and to treat some kinds of influenza or the flu. It will not work for colds or other viral infections. This medicine may be used for other purposes; ask your health care provider or pharmacist if you have questions. COMMON BRAND NAME(S): Tamiflu What should I tell my health care provider before I take this medicine? They need to know if you have any of the following conditions: -heart disease -immune system problems -kidney disease -liver disease -lung disease -an unusual or allergic reaction to oseltamivir, other medicines, foods, dyes, or preservatives -pregnant or trying to get pregnant -breast-feeding How should I use this medicine? Take this medicine by mouth with a glass of water. Follow the directions on the prescription label. Start this medicine at the first sign of flu symptoms. You can take it with or without food. If it upsets your stomach, take it with food. Take your medicine at regular intervals. Do not take your medicine more often than directed. Take all of your medicine as directed even if you think you are better. Do not skip doses or stop your medicine early. Talk to your pediatrician regarding the use of this medicine in children. While this drug may be prescribed for children as young as 14 days for selected conditions, precautions do apply. Overdosage: If you think you have taken too much of this medicine contact a poison control center or emergency room at once. NOTE: This medicine is only for you. Do not share this medicine with others. What if I miss a dose? If you miss a dose, take it as soon as you remember. If it is almost time for your next dose (within 2 hours), take only that dose. Do not take double or extra  doses. What may interact with this medicine? Interactions are not expected. This list may not describe all possible interactions. Give your health care provider a list of all the medicines, herbs, non-prescription drugs, or dietary supplements you use. Also tell them if you smoke, drink alcohol, or use illegal drugs. Some items may interact with your medicine. What should I watch for while using this medicine? Visit your doctor or health care professional for regular check ups. Tell your doctor if your symptoms do not start to get better or if they get worse. If you have the flu, you may be at an increased risk of developing seizures, confusion, or abnormal behavior. This occurs early in the illness, and more frequently in children and teens. These events are not common, but may result in accidental injury to the patient. Families and caregivers of patients should watch for signs of unusual behavior and contact a doctor or health care professional right away if the patient shows signs of unusual behavior. This medicine is not a substitute for the flu shot. Talk to your doctor each year about an annual flu shot. What side effects may I notice from receiving this medicine?  Side effects that you should report to your doctor or health care professional as soon as possible: -allergic reactions like skin rash, itching or hives, swelling of the face, lips, or tongue -anxiety, confusion, unusual behavior -breathing problems -hallucination, loss of contact with reality -redness, blistering, peeling or loosening of the skin, including inside the mouth -seizures Side effects that usually do not require medical attention (report to your doctor or health care professional if they continue or are bothersome): -cough -diarrhea -dizziness -headache -nausea, vomiting -stomach pain This list may not describe all possible side effects. Call your doctor for medical advice about side effects. You may report side  effects to FDA at 1-800-FDA-1088. Where should I keep my medicine? Keep out of the reach of children. Store at room temperature between 15 and 30 degrees C (59 and 86 degrees F). Throw away any unused medicine after the expiration date. NOTE: This sheet is a summary. It may not cover all possible information. If you have questions about this medicine, talk to your doctor, pharmacist, or health care provider.  2014, [employer] Standard. (2011-11-23 19:43:38)

## 2014-01-19 NOTE — ED Notes (Signed)
Patient given discharge instruction, verbalized understand. IV removed, band aid applied. Patient ambulatory out of the department.  

## 2014-01-25 ENCOUNTER — Ambulatory Visit (INDEPENDENT_AMBULATORY_CARE_PROVIDER_SITE_OTHER): Payer: Medicare Other | Admitting: Cardiovascular Disease

## 2014-01-25 ENCOUNTER — Other Ambulatory Visit: Payer: Self-pay | Admitting: *Deleted

## 2014-01-25 ENCOUNTER — Encounter: Payer: Self-pay | Admitting: Cardiovascular Disease

## 2014-01-25 VITALS — BP 140/70 | HR 72 | Ht 68.0 in | Wt 152.6 lb

## 2014-01-25 DIAGNOSIS — J471 Bronchiectasis with (acute) exacerbation: Secondary | ICD-10-CM

## 2014-01-25 DIAGNOSIS — R0609 Other forms of dyspnea: Secondary | ICD-10-CM

## 2014-01-25 DIAGNOSIS — E785 Hyperlipidemia, unspecified: Secondary | ICD-10-CM

## 2014-01-25 DIAGNOSIS — R002 Palpitations: Secondary | ICD-10-CM

## 2014-01-25 DIAGNOSIS — R079 Chest pain, unspecified: Secondary | ICD-10-CM

## 2014-01-25 DIAGNOSIS — R0989 Other specified symptoms and signs involving the circulatory and respiratory systems: Secondary | ICD-10-CM

## 2014-01-25 DIAGNOSIS — E039 Hypothyroidism, unspecified: Secondary | ICD-10-CM

## 2014-01-25 DIAGNOSIS — I059 Rheumatic mitral valve disease, unspecified: Secondary | ICD-10-CM

## 2014-01-25 DIAGNOSIS — G4710 Hypersomnia, unspecified: Secondary | ICD-10-CM

## 2014-01-25 DIAGNOSIS — R0600 Dyspnea, unspecified: Secondary | ICD-10-CM

## 2014-01-25 DIAGNOSIS — I10 Essential (primary) hypertension: Secondary | ICD-10-CM

## 2014-01-25 NOTE — Patient Instructions (Signed)
Labs TSH, MAGNESIUM  TAKE METOPROLOL AT 9 AM and 9 pm  (about 12 hour apart)  Your physician has requested that you have an echocardiogram. Echocardiography is a painless test that uses sound waves to create images of your heart. It provides your doctor with information about the size and shape of your heart and how well your heart's chambers and valves are working. This procedure takes approximately one hour. There are no restrictions for this procedure.  Your physician has requested that you have a lexiscan myoview. For further information please visit [email or web address]. Please follow instruction sheet, as given.    Your physician has requested that you have an echocardiogram. Echocardiography is a painless test that uses sound waves to create images of your heart. It provides your doctor with information about the size and shape of your heart and how well your heart's chambers and valves are working. This procedure takes approximately one hour. There are no restrictions for this procedure.  Your physician has recommended that you have a sleep study. This test records several body functions during sleep, including: brain activity, eye movement, oxygen and carbon dioxide blood levels, heart rate and rhythm, breathing rate and rhythm, the flow of air through your mouth and nose, snoring, body muscle movements, and chest and belly movement.  Your physician wants you to follow-up in [place]. You will receive a reminder letter in the mail two months in advance. If you don't receive a letter, please call our office to schedule the follow-up appointment.

## 2014-01-30 ENCOUNTER — Encounter: Payer: Self-pay | Admitting: Cardiovascular Disease

## 2014-01-30 DIAGNOSIS — R079 Chest pain, unspecified: Secondary | ICD-10-CM | POA: Insufficient documentation

## 2014-01-30 NOTE — Progress Notes (Signed)
Patient ID: Christy Richardson, female   DOB: 22-Aug-1935, 79 y.o.   MRN: [id number]     HPI: Christy Richardson is a 79 y.o. female who presents to the office today for a 9 month cardiology evaluation.  Christy Richardson is a 79 year old female who has a history of mitral valve prolapse with palpitations, bronchiectasis, GERD, goirter, mixed hyperlipidemia, as well as hypertension with documented grade 1 diastolic dysfunction. Dr. Lake Bells follows her bronchiectasis. In October 2012 a nuclear perfusion study revealed normal perfusion and an echo Doppler study revealed normal systolic function with grade 1 diastolic dysfunction, mild aortic valve sclerosis without stenosis, trace AR, MR, and mild TR as well as PR. She had upper normal RV pressure 26 mm.  Recently, she has noticed that her blood pressure has been elevated in the morning. She also has noticed episodes of increased heart rate in the morning. This morning prior to coming to the office she stated that her heart rate was 114 beats per minute.  She has a 30 year history of hypertension. She also has GERD. She has experienced no episodes of chest tightness. Several months ago her beta blocker dose was increased. She also is on pravastatin for hyperlipidemia. She states last week she was evaluated at the emergency room at [facility]. I reviewed the records and apparently her blood pressure that day was 158/110. She also noted episodes of chest tightness. Her EKG was unremarkable. She was treated with albuterol and ipratropium. Chest x-ray did not show evidence for pneumonia. She was advised to resume her GI medication and she also was given a prescription for oseltamivir to treat possible influenza. She presents now for cardiac evaluation.  Of note, the patient also states upon questioning that she does snore loudly. She wakes up approximately 3 times per night. Her heart rate has increased at night.   Past Medical History  Diagnosis Date  . Mitral  valve prolapse   . Nonspecific abnormal electrocardiogram (ECG) (EKG)   . Fatigue   . Neck mass   . Recurrent UTI   . Fecal incontinence   . IBS (irritable bowel syndrome)   . Migraine   . Hypothyroidism   . Glaucoma   . Hypertension   . GERD (gastroesophageal reflux disease)   . Diverticulosis of colon (without mention of hemorrhage)   . Depression   . Anxiety   . Allergic rhinitis   . Pulmonary nodules   . DOE (dyspnea on exertion)   . Hyperlipidemia   . Adenomatous polyp of colon 03/2007  . Polyp, sigmoid colon   . Bronchiectasis   . Blood transfusion   . Heart murmur   . Diabetes mellitus without complication   . Tremor   . Palpitations   . Goiter     Past Surgical History  Procedure Laterality Date  . Abdominal hysterectomy    . Oophorectomy    . Mastoidectomy      rt ear  . Total hip arthroplasty  5/10    left  . Colonoscopy    . Esophagogastroduodenoscopy    . Cardiovascular stress test  09/19/2011    No scintigraphic evidence of inducible myocardial ischemia. Pharmacological stress test without chest pain or EKG changes for ischemia.  . Transthoracic echocardiogram  09/19/2011    EF >78%, stage 1 diastolic dysfunction, mild tricuspid valve regurg    Allergies  Allergen Reactions  . Penicillins     REACTION: Rash  . Prednisone     REACTION: Increase  eye pressure with gloucoma. Broke out in rash after injection per Dr. Percell Miller  . Sulfonamide Derivatives     REACTION: Rash - not sure    Current Outpatient Prescriptions  Medication Sig Dispense Refill  . Alpha-D-Galactosidase (BEANO) TABS Take 1 tablet by mouth as needed (for gas).       . ALPRAZolam (XANAX) 1 MG tablet Take 1 tablet (1 mg total) by mouth at bedtime as needed.      Marland Kitchen aspirin 81 MG tablet Take 81 mg by mouth daily.        . bimatoprost (LUMIGAN) 0.01 % SOLN Place 1 drop into the left eye at bedtime.      . cetirizine (ZYRTEC) 10 MG tablet Take 10 mg by mouth daily.       . Difluprednate  (DUREZOL) 0.05 % EMUL Apply 1 drop to eye 3 (three) times daily.      . dorzolamide-timolol (COSOPT) 22.3-6.8 MG/ML ophthalmic solution Place 1 drop into the left eye 2 (two) times daily.        . Fluticasone-Salmeterol (ADVAIR DISKUS) 250-50 MCG/DOSE AEPB Inhale 1 puff into the lungs 2 (two) times daily.  60 each  5  . HYDROcodone-acetaminophen (VICODIN) 5-500 MG per tablet 1 tablet every 6 (six) hours as needed for pain.      Marland Kitchen levothyroxine (SYNTHROID, LEVOTHROID) 50 MCG tablet Take 50 mcg by mouth daily before breakfast.      . losartan (COZAAR) 100 MG tablet Take by mouth 2 (two) times daily. 1/2 tablet      . meloxicam (MOBIC) 7.5 MG tablet Take 7.5 mg by mouth 2 (two) times daily.      . methocarbamol (ROBAXIN) 750 MG tablet Take 750 mg by mouth daily as needed for muscle spasms.       . metoprolol (LOPRESSOR) 50 MG tablet Take 50 mg by mouth 2 (two) times daily.      . Multiple Vitamins-Minerals (MULTIVITAMIN WITH MINERALS) tablet Take 1 tablet by mouth daily.        . naproxen (NAPROSYN) 250 MG tablet Take 250 mg by mouth daily as needed for moderate pain.      . pravastatin (PRAVACHOL) 40 MG tablet Take 1 tablet (40 mg total) by mouth at bedtime.      . sertraline (ZOLOFT) 100 MG tablet Take 1 tablet (100 mg total) by mouth at bedtime. 2 at bedtime      . sodium chloride (OCEAN NASAL SPRAY) 0.65 % nasal spray Place 2 sprays into both nostrils at bedtime. Use as needed      . traZODone (DESYREL) 50 MG tablet Take 50 mg by mouth at bedtime.         No current facility-administered medications for this visit.    History   Social History  . Marital Status: Widowed    Spouse Name: N/A    Number of Children: 2  . Years of Education: 10th grade   Occupational History  . Retired     Scientist, research (medical)   Social History Main Topics  . Smoking status: Never Smoker   . Smokeless tobacco: Never Used  . Alcohol Use: No  . Drug Use: No  . Sexual Activity: Not on file   Other Topics Concern  .  Not on file   Social History Narrative   Widowed since 2002. Lives with her Daughter.   Socially she is widowed and has 2 children. No tobacco use. She does not routinely exercise.  Family History  Problem  Relation Age of Onset  . Diabetes Father   . Throat cancer Mother   . Coronary artery disease Mother   . Prostate cancer Brother   . Asthma Daughter   . Scoliosis Daughter   . Esophageal cancer Neg Hx   . Rectal cancer Neg Hx   . Stomach cancer Neg Hx   . Colon cancer Paternal Grandfather     ROS is negative for fevers, chills or night sweats. There are no headaches. She denies change in vision or hearing. She is unaware of lymphadenopathy. She denies purulent sputum. She denies further wheezing. She does admit to episodes of some intermittent chest tightness. There is a history of GERD. She denies nausea vomiting or diarrhea. There is no presyncope or syncope. She is unaware of blood in her stool or urine. She denies claudications. Her sleep is poor. She does note increased heart rates in the early morning. There is no diabetes. She does have hyperlipidemia. She is unaware of cold intolerance. She does drink caffeine beverage.   Other comprehensive 14 point system review is negative.  PE BP 140/70  Pulse 72  Ht 5\' 8"  (1.727 m)  Wt 152 lb 9.6 oz (69.219 kg)  BMI 23.21 kg/m2  General: Alert, oriented, no distress.  Skin: normal turgor, no rashes HEENT: Normocephalic, atraumatic. Pupils round and reactive; sclera anicteric;no lid lag. Extraocular muscles intact;; no xanthelasmas. Nose without nasal septal hypertrophy Mouth/Parynx benign; Mallinpatti scale 3 Neck: No JVD, no carotid bruits; normal carotid upstroke Lungs: clear to ausculatation and percussion; no wheezing or rales Chest wall: no tenderness to palpitation Heart: RRR, s1 s2 normal; 1/6 systolic murmur along the lesser border. no diastolic murmur, rub thrills or heaves Abdomen: soft, nontender; no hepatosplenomehaly,  BS+; abdominal aorta nontender and not dilated by palpation. Back: no CVA tenderness Pulses 2+ Extremities: no clubbing cyanosis or edema, Homan's sign negative  Neurologic: grossly nonfocal; cranial nerves grossly normal. Psychologic: normal affect and mood.  ECG (independently read by me): Sinus rhythm at 72 beats per minute. Small Q-wave in lead aVL. No significant ST changes.  LABS:  BMET    Component Value Date/Time   NA 138 01/18/2014 2256   K 4.0 01/18/2014 2256   CL 102 01/18/2014 2256   CO2 24 01/18/2014 2256   GLUCOSE 128* 01/18/2014 2256   BUN 16 01/18/2014 2256   CREATININE 0.85 01/18/2014 2256   CALCIUM 9.1 01/18/2014 2256   GFRNONAA 65* 01/18/2014 2256   GFRAA 75* 01/18/2014 2256     Hepatic Function Panel     Component Value Date/Time   PROT 6.7 04/15/2009 1419   ALBUMIN 3.9 04/15/2009 1419   AST 24 04/15/2009 1419   ALT 14 04/15/2009 1419   ALKPHOS 63 04/15/2009 1419   BILITOT 0.6 04/15/2009 1419     CBC    Component Value Date/Time   WBC 7.5 01/18/2014 2256   RBC 4.81 01/18/2014 2256   HGB 14.7 01/18/2014 2256   HCT 42.7 01/18/2014 2256   PLT 180 01/18/2014 2256   MCV 88.8 01/18/2014 2256   MCH 30.6 01/18/2014 2256   MCHC 34.4 01/18/2014 2256   RDW 12.6 01/18/2014 2256   LYMPHSABS 0.2* 01/18/2014 2256   MONOABS 0.4 01/18/2014 2256   EOSABS 0.3 01/18/2014 2256   BASOSABS 0.0 01/18/2014 2256     BNP No results found for this basename: probnp    Lipid Panel     Component Value Date/Time   CHOL 201* 03/01/2009 2017   TRIG  189* 03/01/2009 2017   HDL 47 03/01/2009 2017   CHOLHDL 4.3 Ratio 03/01/2009 2017   VLDL 38 03/01/2009 2017   LDLCALC 116* 03/01/2009 2017     RADIOLOGY: Dg Chest 2 View  01/18/2014   CLINICAL DATA:  Generalized weakness.  Body aches.  Hypertension.  EXAM: CHEST  2 VIEW  COMPARISON:  09/19/2011  FINDINGS: Elevated right hemidiaphragm. Lungs are clear. Heart is normal size. No effusions. No acute bony abnormality.  IMPRESSION: No active  cardiopulmonary disease.   Electronically Signed   By: Rolm Baptise M.D.   On: 01/18/2014 23:49      ASSESSMENT AND PLAN:  Ms. Leatta Alewine is a 79 year old female who has a history of bronchiectasis, mitral valve prolapse with palpitations, GERD, a history of goiter, hypertension with documented grade 1 diastolic dysfunction and hyperlipidemia. She did have laboratory done in November 2014 by Dr. Bertell Maria: I reviewed his laboratory. At that time her total cholesterol was 149 triglycerides 1:15 HDL 52 LDL 74. Hemoglobin A1c was 5.5. Liver function studies were normal. Thyroid function was normal with a TSH of 3.945. She had normal chemistry profile with a BUN 11 creatinine 0.95. She was not anemic with a hemoglobin of 15.5 hematocrit of 45.3. I also reviewed her recent blood work done during her ER evaluation. She does have periods of increased shortness of breath which may be lung related versus cardiac. Her last echo Doppler study was in 2012 recommending a repeat echo Doppler study obtained to further evaluate both systolic and diastolic function as well as pulmonary pressures and valvular architecture. With her chest pain I am recommending a LexiScan Myoview study be done to make certain this is nonischemic mediated. Also concerned that she may very well have obstructive sleep apnea. She has noticed increased heart rate while sleeping, she does snore, she wakes up at least 3 times per night and feels her sleep is nonrestorative. I recommended the she changed the time when she takes her metoprolol such that she takes this at 12 hour intervals to take her evening dose at approximately 9 PM. I will change her to 50 mg in the morning and 75 mg at night which may be helpful to reduce her early morning episodes of increased heart rate and BP. I will see her back in the office in followup of the above studies. We will also check a magnesium level and re check TSH.    Troy Sine, MD, [facility]  01/30/2014 5:02  PM

## 2014-02-02 ENCOUNTER — Ambulatory Visit (HOSPITAL_COMMUNITY)
Admission: RE | Admit: 2014-02-02 | Discharge: 2014-02-02 | Disposition: A | Discharge status: Home / Self Care | Payer: Medicare Other | Source: Ambulatory Visit | Attending: Cardiovascular Disease | Admitting: Cardiovascular Disease

## 2014-02-02 DIAGNOSIS — R002 Palpitations: Secondary | ICD-10-CM

## 2014-02-02 DIAGNOSIS — R0609 Other forms of dyspnea: Secondary | ICD-10-CM

## 2014-02-02 DIAGNOSIS — I10 Essential (primary) hypertension: Secondary | ICD-10-CM

## 2014-02-02 DIAGNOSIS — R0989 Other specified symptoms and signs involving the circulatory and respiratory systems: Secondary | ICD-10-CM | POA: Insufficient documentation

## 2014-02-02 DIAGNOSIS — I369 Nonrheumatic tricuspid valve disorder, unspecified: Secondary | ICD-10-CM

## 2014-02-02 NOTE — Progress Notes (Signed)
2D Echo Performed 02/02/2014    Acel Natzke, RCS  

## 2014-02-03 LAB — MAGNESIUM: Magnesium: 2.1 mg/dL (ref 1.5–2.5)

## 2014-02-03 LAB — TSH: TSH: 1.143 u[IU]/mL (ref 0.350–4.500)

## 2014-02-04 ENCOUNTER — Telehealth: Payer: Self-pay | Admitting: *Deleted

## 2014-02-04 ENCOUNTER — Ambulatory Visit (HOSPITAL_COMMUNITY)
Admission: RE | Admit: 2014-02-04 | Discharge: 2014-02-04 | Disposition: A | Discharge status: Home / Self Care | Payer: Medicare Other | Source: Ambulatory Visit | Attending: Cardiology | Admitting: Cardiology

## 2014-02-04 DIAGNOSIS — R002 Palpitations: Secondary | ICD-10-CM

## 2014-02-04 DIAGNOSIS — R0989 Other specified symptoms and signs involving the circulatory and respiratory systems: Secondary | ICD-10-CM | POA: Insufficient documentation

## 2014-02-04 DIAGNOSIS — R0609 Other forms of dyspnea: Secondary | ICD-10-CM | POA: Insufficient documentation

## 2014-02-04 DIAGNOSIS — I10 Essential (primary) hypertension: Secondary | ICD-10-CM | POA: Insufficient documentation

## 2014-02-04 MED ORDER — TECHNETIUM TC 99M SESTAMIBI GENERIC - CARDIOLITE
30.5000 | Freq: Once | INTRAVENOUS | Status: AC | PRN
  Administered 2014-02-04: 31 via INTRAVENOUS

## 2014-02-04 MED ORDER — AMINOPHYLLINE 25 MG/ML IV SOLN
75.0000 mg | Freq: Once | INTRAVENOUS | Status: AC
  Administered 2014-02-04: 75 mg via INTRAVENOUS

## 2014-02-04 MED ORDER — REGADENOSON 0.4 MG/5ML IV SOLN
0.4000 mg | Freq: Once | INTRAVENOUS | Status: AC
  Administered 2014-02-04: 0.4 mg via INTRAVENOUS

## 2014-02-04 MED ORDER — TECHNETIUM TC 99M SESTAMIBI GENERIC - CARDIOLITE
10.7000 | Freq: Once | INTRAVENOUS | Status: AC | PRN
  Administered 2014-02-04: 11 via INTRAVENOUS

## 2014-02-04 NOTE — Procedures (Addendum)
[facility] [address] [place] [address] [phone]  Cardiology Nuclear Med Study  Christy Richardson is a 79 y.o. female     MRN : [id number]     DOB: 1935/07/31  Procedure Date: 02/04/2014  Nuclear Med Background Indication for Stress Test:  Evaluation for Ischemia History:  MVP and bronciectasis;pulmonary nodule Cardiac Risk Factors: Hypertension and Lipids  Symptoms:  Chest Pain, Dizziness, DOE, Fatigue, Light-Headedness and Palpitations   Nuclear Pre-Procedure Caffeine/Decaff Intake:  1:00am NPO After: 11am   IV Site: R Hand  IV 0.9% NS with Angio Cath:  22g  Chest Size (in):  n/a IV Started by: Azucena Cecil, RN  Height: 5\' 8"  (1.727 m)  Cup Size: A  BMI:  Body mass index is 23.12 kg/(m^2). Weight:  152 lb (68.947 kg)   Tech Comments:  n/a    Nuclear Med Study 1 or 2 day study: 1 day  Stress Test Type:  [place] Provider:  Shelva Majestic, MD   Resting Radionuclide: Technetium 53m Sestamibi  Resting Radionuclide Dose: 10.7 mCi   Stress Radionuclide:  Technetium 52m Sestamibi  Stress Radionuclide Dose: 30.5 mCi           Stress Protocol Rest HR: 66 Stress HR: 103  Rest BP: 154/90 Stress BP: 163/85  Exercise Time (min): n/a METS: n/a          Dose of Adenosine (mg):  n/a Dose of Lexiscan: 0.4 mg  Dose of Atropine (mg): n/a Dose of Dobutamine: n/a mcg/kg/min (at max HR)  Stress Test Technologist: Mellody Memos, CCT Nuclear Technologist: Imagene Riches, CNMT   Rest Procedure:  Myocardial perfusion imaging was performed at rest 45 minutes following the intravenous administration of Technetium 39m Sestamibi. Stress Procedure:  The patient received IV Lexiscan 0.4 mg over 15-seconds.  Technetium 44m Sestamibi injected at 30-seconds.  Due to patient's shortness of breath, chest tightness and head pain, she was given IV Aminophylline 75 mg. Symptoms were resolved during recovery. There were no  significant changes with Lexiscan.  Quantitative spect images were obtained after a 45 minute delay.  Transient Ischemic Dilatation (Normal <1.22):  1.08 Lung/Heart Ratio (Normal <0.45):  0.24 QGS EDV:  56 ml QGS ESV:  15 ml LV Ejection Fraction: 73%     Rest ECG: NSR - Normal EKG  Stress ECG: No significant change from baseline ECG  QPS Raw Data Images:  Normal; no motion artifact; normal heart/lung ratio. Stress Images:  Normal homogeneous uptake in all areas of the myocardium. Rest Images:  Normal homogeneous uptake in all areas of the myocardium. Subtraction (SDS):  No evidence of ischemia. LV Wall Motion:  NL LV Function; NL Wall Motion  Impression Exercise Capacity:  Lexiscan with no exercise. BP Response:  Normal blood pressure response. Clinical Symptoms:  No significant symptoms noted. ECG Impression:  No significant ECG changes with Lexiscan. Comparison with Prior Nuclear Study: No previous nuclear study performed   Overall Impression:  Normal stress nuclear study.   Sanda Klein, MD  02/04/2014 4:42 PM

## 2014-02-04 NOTE — Telephone Encounter (Signed)
Pt in office for test.  Stated she has worn the monitor for 1 week and it is breaking her skin out.  Stated she has been wearing the sensitive skin patches every other day and still w/ redness and irritation.  DOD unavailable. Cardionet closed b/c of weather.  Pt informed DOD will be notified when available as Dr. Claiborne Billings is out of the office.  May have to move f/u appt up sooner to find an alternative to 30-day monitor.  Pt verbalized understanding and agreed w/ plan.  Message forwarded to DOD (Dr. Ellyn Hack) to review and advise, as primary cardiologist is out of the office.

## 2014-02-05 NOTE — Telephone Encounter (Signed)
For now - hold off on using it.  Would decision as to other alternative options to Dr. Claiborne Billings.  ? Need for prolonged monitor -- ? Loop recorder.    Leonie Man, MD

## 2014-02-05 NOTE — Telephone Encounter (Signed)
Returned call and informed pt per instructions by MD.  Pt verbalized understanding and agreed w/ plan.  Message forwarded to Dr. Arnette Norris, CMA.

## 2014-02-08 NOTE — Telephone Encounter (Signed)
This message printed and placed on Dr. Evette Georges cart.

## 2014-02-12 ENCOUNTER — Telehealth: Payer: Self-pay | Admitting: *Deleted

## 2014-02-12 ENCOUNTER — Telehealth: Payer: Self-pay | Admitting: Cardiovascular Disease

## 2014-02-12 NOTE — Telephone Encounter (Signed)
Pt says she is still waiting to hear about what to do about the monitor.

## 2014-02-12 NOTE — Telephone Encounter (Signed)
Returned call.  Left message that MD notified and no response.  Will be notified once response given.  Also to call back before 4pm if questions.

## 2014-02-12 NOTE — Telephone Encounter (Signed)
Spoke w/ Dr. Claiborne Billings and he advised pt d/c monitor and send it back to [facility].    Call to pt and informed.  Pt verbalized understanding and agreed w/ plan.

## 2014-02-16 ENCOUNTER — Encounter: Payer: Self-pay | Admitting: *Deleted

## 2014-02-16 ENCOUNTER — Telehealth: Payer: Self-pay | Admitting: *Deleted

## 2014-02-16 NOTE — Telephone Encounter (Signed)
Called to give echo and nuc results. Will send a note.

## 2014-02-17 ENCOUNTER — Ambulatory Visit: Payer: Medicare Other | Admitting: Cardiovascular Disease

## 2014-02-26 ENCOUNTER — Telehealth: Payer: Self-pay | Admitting: Pulmonary Disease

## 2014-02-26 MED ORDER — FLUTICASONE-SALMETEROL 250-50 MCG/DOSE IN AEPB: 1.0000 | INHALATION_SPRAY | Freq: Two times a day (BID) | RESPIRATORY_TRACT | Status: DC

## 2014-02-26 NOTE — Telephone Encounter (Signed)
Called and spoke with pt and she is aware of refill that has been sent in to the pharmacy for her.  She was transferred up front to schedule an appt with BQ.  She stated that she has had so many thing go wrong recently and has had several caths done, so that is why she has not been in to see BQ.  Nothing further is needed.

## 2014-03-11 ENCOUNTER — Encounter: Payer: Self-pay | Admitting: Cardiovascular Disease

## 2014-03-11 ENCOUNTER — Ambulatory Visit (INDEPENDENT_AMBULATORY_CARE_PROVIDER_SITE_OTHER): Payer: Medicare Other | Admitting: Cardiovascular Disease

## 2014-03-11 VITALS — BP 159/81 | HR 67 | Ht 68.0 in | Wt 152.2 lb

## 2014-03-11 DIAGNOSIS — I10 Essential (primary) hypertension: Secondary | ICD-10-CM

## 2014-03-11 DIAGNOSIS — I5189 Other ill-defined heart diseases: Secondary | ICD-10-CM

## 2014-03-11 DIAGNOSIS — I519 Heart disease, unspecified: Secondary | ICD-10-CM

## 2014-03-11 DIAGNOSIS — R0609 Other forms of dyspnea: Secondary | ICD-10-CM

## 2014-03-11 DIAGNOSIS — R0600 Dyspnea, unspecified: Secondary | ICD-10-CM

## 2014-03-11 DIAGNOSIS — E785 Hyperlipidemia, unspecified: Secondary | ICD-10-CM

## 2014-03-11 DIAGNOSIS — R0989 Other specified symptoms and signs involving the circulatory and respiratory systems: Secondary | ICD-10-CM

## 2014-03-11 MED ORDER — IRBESARTAN-HYDROCHLOROTHIAZIDE 300-12.5 MG PO TABS: 1.0000 | ORAL_TABLET | Freq: Every day | ORAL | Status: DC

## 2014-03-11 NOTE — Patient Instructions (Signed)
Your physician has recommended you make the following change in your medication: stop the losartan. Start new prescription for Irbesartan-HCT 300/12.5 MG This has already been sent to the pharmacy.  Your physician recommends that you return for lab work in: 2 weeks.  Your physician recommends that you schedule a follow-up appointment in: 4 months.

## 2014-03-11 NOTE — Progress Notes (Addendum)
Patient ID: CAMBRI PLOURDE, female   DOB: Oct 13, 1935, 79 y.o.   MRN: [id number]     HPI: GALYA DUNNIGAN is a 79 y.o. female who presents to the office today for a 2 month cardiology evaluation.  Ms. Julson has a history of mitral valve prolapse with palpitations, bronchiectasis, GERD, goirter, mixed hyperlipidemia, as well as hypertension with documented grade 1 diastolic dysfunction. Dr. Lake Bells follows her bronchiectasis. In October 2012 a nuclear perfusion study revealed normal perfusion and an echo Doppler study revealed normal systolic function with grade 1 diastolic dysfunction, mild aortic valve sclerosis without stenosis, trace AR, MR, and mild TR as well as PR. She had upper normal RV pressure 26 mm.d Prior to her last office visit in February 2015, she had noticed that her blood pressure has been elevated in the morning. She also has noticed episodes of increased heart rate in the morning. This morning prior to coming to the office her heart rate was 114 beats per minute.  She has a 30 year history of hypertension. She also has GERD. She has experienced no episodes of chest tightness. Several months ago her beta blocker dose was increased. She also is on pravastatin for hyperlipidemia. She states last week she was evaluated at the emergency room at [facility]. I reviewed the records and apparently her blood pressure that day was 158/110. She also noted episodes of chest tightness. Her EKG was unremarkable. She was treated with albuterol and ipratropium. Chest x-ray did not show evidence for pneumonia. She was advised to resume her GI medication and she also was given a prescription for oseltamivir to treat possible influenza. She presents now for cardiac evaluation.  She does snore loudly. She wakes up approximately 3 times per night. Her heart rate has increased at night.  When I last saw her I recommended that she have asleep study for further evaluation.  Apparently, this has not yet  been done, but is scheduled to be done next week.  Since I last saw her, she underwent an echo Doppler study on 02/02/2014.  This showed an ejection fraction of 60-65%.  She had grade 1 diastolic dysfunction.  There was mild aortic sclerosis without stenosis.  A lexiscan perfusion study on 02/04/2014 was normal without scar or ischemia.  Post stress ejection fraction was 73%.  She did wear a CardioNet monitor to assess for palpitations.  Unfortunately, she developed significant skin irritation, and only wore this for 9 days.  This revealed episodes of sinus rhythm, but she did have episodes of sinus tachycardia with rates in the low 100s.  There was there were no episodes of SVT or atrial fibrillation.   Past Medical History  Diagnosis Date  . Mitral valve prolapse   . Nonspecific abnormal electrocardiogram (ECG) (EKG)   . Fatigue   . Neck mass   . Recurrent UTI   . Fecal incontinence   . IBS (irritable bowel syndrome)   . Migraine   . Hypothyroidism   . Glaucoma   . Hypertension   . GERD (gastroesophageal reflux disease)   . Diverticulosis of colon (without mention of hemorrhage)   . Depression   . Anxiety   . Allergic rhinitis   . Pulmonary nodules   . DOE (dyspnea on exertion)   . Hyperlipidemia   . Adenomatous polyp of colon 03/2007  . Polyp, sigmoid colon   . Bronchiectasis   . Blood transfusion   . Heart murmur   . Diabetes mellitus without complication   .  Tremor   . Palpitations   . Goiter     Past Surgical History  Procedure Laterality Date  . Abdominal hysterectomy    . Oophorectomy    . Mastoidectomy      rt ear  . Total hip arthroplasty  5/10    left  . Colonoscopy    . Esophagogastroduodenoscopy    . Cardiovascular stress test  09/19/2011    No scintigraphic evidence of inducible myocardial ischemia. Pharmacological stress test without chest pain or EKG changes for ischemia.  . Transthoracic echocardiogram  09/19/2011    EF >00%, stage 1 diastolic  dysfunction, mild tricuspid valve regurg    Allergies  Allergen Reactions  . Penicillins     REACTION: Rash  . Prednisone     REACTION: Increase eye pressure with gloucoma. Broke out in rash after injection per Dr. Percell Miller  . Sulfonamide Derivatives     REACTION: Rash - not sure    Current Outpatient Prescriptions  Medication Sig Dispense Refill  . Alpha-D-Galactosidase (BEANO) TABS Take 1 tablet by mouth as needed (for gas).       . ALPRAZolam (XANAX) 1 MG tablet Take 1 tablet (1 mg total) by mouth at bedtime as needed.      Marland Kitchen aspirin 81 MG tablet Take 81 mg by mouth daily.        . bimatoprost (LUMIGAN) 0.01 % SOLN Place 1 drop into the left eye at bedtime.      . cetirizine (ZYRTEC) 10 MG tablet Take 10 mg by mouth daily.       . Difluprednate (DUREZOL) 0.05 % EMUL Apply 1 drop to eye 3 (three) times daily.      . dorzolamide-timolol (COSOPT) 22.3-6.8 MG/ML ophthalmic solution Place 1 drop into the left eye 2 (two) times daily.        . Fluticasone-Salmeterol (ADVAIR DISKUS) 250-50 MCG/DOSE AEPB Inhale 1 puff into the lungs 2 (two) times daily.  60 each  2  . HYDROcodone-acetaminophen (VICODIN) 5-500 MG per tablet 1 tablet every 6 (six) hours as needed for pain.      Marland Kitchen levothyroxine (SYNTHROID, LEVOTHROID) 50 MCG tablet Take 50 mcg by mouth daily before breakfast.      . losartan (COZAAR) 100 MG tablet Take by mouth 2 (two) times daily. 1/2 tablet      . meloxicam (MOBIC) 7.5 MG tablet Take 7.5 mg by mouth 2 (two) times daily.      . methocarbamol (ROBAXIN) 750 MG tablet Take 750 mg by mouth daily as needed for muscle spasms.       . metoprolol (LOPRESSOR) 50 MG tablet Take 50 mg by mouth 2 (two) times daily.      . Multiple Vitamins-Minerals (MULTIVITAMIN WITH MINERALS) tablet Take 1 tablet by mouth daily.        . naproxen (NAPROSYN) 250 MG tablet Take 250 mg by mouth daily as needed for moderate pain.      . pravastatin (PRAVACHOL) 40 MG tablet Take 1 tablet (40 mg total) by mouth  at bedtime.      . sertraline (ZOLOFT) 100 MG tablet Take 1 tablet (100 mg total) by mouth at bedtime. 2 at bedtime      . sodium chloride (OCEAN NASAL SPRAY) 0.65 % nasal spray Place 2 sprays into both nostrils at bedtime. Use as needed      . traZODone (DESYREL) 50 MG tablet Take 50 mg by mouth at bedtime.         No  current facility-administered medications for this visit.    History   Social History  . Marital Status: Widowed    Spouse Name: N/A    Number of Children: 2  . Years of Education: 10th grade   Occupational History  . Retired     Scientist, research (medical)   Social History Main Topics  . Smoking status: Never Smoker   . Smokeless tobacco: Never Used  . Alcohol Use: No  . Drug Use: No  . Sexual Activity: Not on file   Other Topics Concern  . Not on file   Social History Narrative   Widowed since 2002. Lives with her Daughter.   Socially she is widowed and has 2 children. No tobacco use. She does not routinely exercise.  Family History  Problem Relation Age of Onset  . Diabetes Father   . Throat cancer Mother   . Coronary artery disease Mother   . Prostate cancer Brother   . Asthma Daughter   . Scoliosis Daughter   . Esophageal cancer Neg Hx   . Rectal cancer Neg Hx   . Stomach cancer Neg Hx   . Colon cancer Paternal Grandfather     ROS is negative for fevers, chills or night sweats. There are no headaches. She denies change in vision or hearing. She is unaware of lymphadenopathy. She denies purulent sputum. She denies further wheezing. She does admit to episodes of some intermittent chest tightness. There is a history of GERD. She denies nausea vomiting or diarrhea. There is no presyncope or syncope. She is unaware of blood in her stool or urine. She denies claudications. Her sleep is poor. She does note increased heart rates in the early morning. There is no diabetes. She does have hyperlipidemia. She is unaware of cold intolerance. She does drink caffeine beverage.    Other comprehensive 14 point system review is negative.  PE BP 159/81  Pulse 67  Ht 5\' 8"  (1.727 m)  Wt 152 lb 3.2 oz (69.037 kg)  BMI 23.15 kg/m2  Repeat blood pressure by me was 154/80. General: Alert, oriented, no distress.  Skin: normal turgor, no rashes HEENT: Normocephalic, atraumatic. Pupils round and reactive; sclera anicteric;no lid lag. Extraocular muscles intact;; no xanthelasmas. Nose without nasal septal hypertrophy Mouth/Parynx benign; Mallinpatti scale 3 Neck: No JVD, no carotid bruits; normal carotid upstroke Lungs: clear to ausculatation and percussion; no wheezing or rales Chest wall: no tenderness to palpitation Heart: RRR, s1 s2 normal; 1/6 systolic murmur along the LSB. no diastolic murmur, rub thrills or heaves Abdomen: soft, nontender; no hepatosplenomehaly, BS+; abdominal aorta nontender and not dilated by palpation. Back: no CVA tenderness Pulses 2+ Extremities: no clubbing cyanosis or edema, Homan's sign negative  Neurologic: grossly nonfocal; cranial nerves grossly normal. Psychologic: normal affect and mood.   Prior ECG (independently read by me): Sinus rhythm at 72 beats per minute. Small Q-wave in lead aVL. No significant ST changes.  LABS:  BMET    Component Value Date/Time   NA 138 01/18/2014 2256   K 4.0 01/18/2014 2256   CL 102 01/18/2014 2256   CO2 24 01/18/2014 2256   GLUCOSE 128* 01/18/2014 2256   BUN 16 01/18/2014 2256   CREATININE 0.85 01/18/2014 2256   CALCIUM 9.1 01/18/2014 2256   GFRNONAA 65* 01/18/2014 2256   GFRAA 75* 01/18/2014 2256     Hepatic Function Panel     Component Value Date/Time   PROT 6.7 04/15/2009 1419   ALBUMIN 3.9 04/15/2009 1419   AST 24  04/15/2009 1419   ALT 14 04/15/2009 1419   ALKPHOS 63 04/15/2009 1419   BILITOT 0.6 04/15/2009 1419     CBC    Component Value Date/Time   WBC 7.5 01/18/2014 2256   RBC 4.81 01/18/2014 2256   HGB 14.7 01/18/2014 2256   HCT 42.7 01/18/2014 2256   PLT 180 01/18/2014 2256   MCV  88.8 01/18/2014 2256   MCH 30.6 01/18/2014 2256   MCHC 34.4 01/18/2014 2256   RDW 12.6 01/18/2014 2256   LYMPHSABS 0.2* 01/18/2014 2256   MONOABS 0.4 01/18/2014 2256   EOSABS 0.3 01/18/2014 2256   BASOSABS 0.0 01/18/2014 2256     BNP No results found for this basename: probnp    Lipid Panel     Component Value Date/Time   CHOL 201* 03/01/2009 2017   TRIG 189* 03/01/2009 2017   HDL 47 03/01/2009 2017   CHOLHDL 4.3 Ratio 03/01/2009 2017   VLDL 38 03/01/2009 2017   LDLCALC 116* 03/01/2009 2017     RADIOLOGY: Dg Chest 2 View  01/18/2014   CLINICAL DATA:  Generalized weakness.  Body aches.  Hypertension.  EXAM: CHEST  2 VIEW  COMPARISON:  09/19/2011  FINDINGS: Elevated right hemidiaphragm. Lungs are clear. Heart is normal size. No effusions. No acute bony abnormality.  IMPRESSION: No active cardiopulmonary disease.   Electronically Signed   By: Rolm Baptise M.D.   On: 01/18/2014 23:49      ASSESSMENT AND PLAN:  Ms. Rozay Bobb is a 79 year old female who has a history of bronchiectasis, mitral valve prolapse with palpitations, GERD, a history of goiter, hypertension with documented grade 1 diastolic dysfunction and hyperlipidemia. Laboratory done in November 2014 demonstrated a total cholesterol was 149 triglycerides 115, HDL 52, LDL 74. Hemoglobin A1c was 5.5. Liver function studies were normal. Thyroid function was normal with a TSH of 3.945. She had normal chemistry profile with a BUN 11 creatinine 0.95. She was not anemic with a hemoglobin of 15.5 hematocrit of 45.3.  I reviewed a recent echo Doppler study with her in detail, as well as her nuclear perfusion study.  Her CardioNet monitor only showed episodes of sinus tachycardia with rates in the low 100s.  She has noted that her palpitations have improved with adjustment of her dose single regimen.  However, her blood pressure today continues to be elevated despite taking losartan 100 mg daily, metoprolol, 50 mg twice a day.  I recommended she  discontinue the losartan.  I will start her on irbesartan HCT 300/12.5.  A repeat CMET will be obtained in 2 weeks to make certain she is tolerating this.  She is tolerating her current dose of pravastatin.  I will see her in 4 months for cardiology reevaluation.   Troy Sine, MD, [facility]  03/11/2014 5:06 PM

## 2014-03-12 ENCOUNTER — Telehealth: Payer: Self-pay | Admitting: *Deleted

## 2014-03-12 NOTE — Telephone Encounter (Signed)
Pt was calling in regards to her medication that was supposed to be called in yesterday. She called the pharmacy and nothing is waiting there for her.  TK

## 2014-03-12 NOTE — Telephone Encounter (Signed)
Returned call and pt verified x 2.  Pt informed message received and script was sent yesterday and confirmation that it was received at 5:29pm.  Pt informed RN will call pharmacy since pt stated they told her they did not have a prescription for her.  Pt verbalized understanding and agreed w/ plan.  Pt did confirm she uses Personal assistant.  Call to [facility] and spoke w/ Darrick Penna, who transferred me to [facility], pharmacist.  Informed script was received and there was a question b/c pt was on losartan from another provider and wanted to make sure Dr. Claiborne Billings knew about it.  Informed Dr. Claiborne Billings advised pt to STOP losartan yesterday.  Verbalized understanding and will fill.  Call back to pt and informed.  Pt verbalized understanding and agreed w/ plan.  Pt aware to STOP losartan.

## 2014-03-13 ENCOUNTER — Encounter: Payer: Self-pay | Admitting: Cardiovascular Disease

## 2014-03-13 DIAGNOSIS — I519 Heart disease, unspecified: Secondary | ICD-10-CM | POA: Insufficient documentation

## 2014-03-13 DIAGNOSIS — I5189 Other ill-defined heart diseases: Secondary | ICD-10-CM | POA: Insufficient documentation

## 2014-03-26 ENCOUNTER — Telehealth: Payer: Self-pay | Admitting: *Deleted

## 2014-03-26 ENCOUNTER — Telehealth: Payer: Self-pay | Admitting: Cardiovascular Disease

## 2014-03-26 MED ORDER — IRBESARTAN-HYDROCHLOROTHIAZIDE 150-12.5 MG PO TABS: 1.0000 | ORAL_TABLET | Freq: Every day | ORAL | Status: DC

## 2014-03-26 NOTE — Telephone Encounter (Signed)
kristen please advise me on patient options.

## 2014-03-26 NOTE — Telephone Encounter (Signed)
Correction - irbesartan hct 150/12.5

## 2014-03-26 NOTE — Telephone Encounter (Signed)
Returned a call to patient. She informs me that she cut the irbesartan-hct into 1/2 and taking it once daily. She decided to do this because it was causing her to become weak and dizzy after taking it. I informed the patient that when she cuts it into that she is also cutting the hct as well as the irbesartan. She has been checking her blood pressure twice daily. Reading have been running in the 250-037'C systolic and in the 48'G dyastolic. Informed the patient that I will speak with kristen, our pharmacist to determine if the medication can be changed to something else that will cause her less symptoms. Patient also asked if she should have the bloodwork drawn that [facility] requested her to have 2 weeks after she takes the medication. Informed the patient that If kristen changes the medication we will wait to do this, if she does not then she should have drawn as planned.patient agreed with plan and will wait a call back from either myself or Kristen.

## 2014-03-26 NOTE — Telephone Encounter (Signed)
Her Irbesartan HCTZ 300/12.5 mg is making her dizzy and weak, She started half of the dose. Does she still need to have lab work next week?

## 2014-03-26 NOTE — Telephone Encounter (Signed)
Christy Richardson - lets call in the irbesartan hct 50/12/5mg  and have her take 1 tablet daily.  Cutting the other in half may be giving her different amounts each day, as the tablets are not scored.  Also will give her whole 12.5mg  hctz which is more beneficial.  Erasmo Downer

## 2014-03-26 NOTE — Telephone Encounter (Signed)
Informed patient per Christy Richardson that we will decrease the dose to irbesartan-hct 150-12.5. New RX sent to patient's pharmacy.

## 2014-04-05 ENCOUNTER — Ambulatory Visit (INDEPENDENT_AMBULATORY_CARE_PROVIDER_SITE_OTHER): Payer: Medicare Other | Admitting: Pulmonary Disease

## 2014-04-05 ENCOUNTER — Encounter: Payer: Self-pay | Admitting: Pulmonary Disease

## 2014-04-05 VITALS — BP 142/72 | HR 72 | Ht 67.75 in | Wt 149.0 lb

## 2014-04-05 DIAGNOSIS — R0602 Shortness of breath: Secondary | ICD-10-CM

## 2014-04-05 DIAGNOSIS — J471 Bronchiectasis with (acute) exacerbation: Secondary | ICD-10-CM

## 2014-04-05 DIAGNOSIS — J309 Allergic rhinitis, unspecified: Secondary | ICD-10-CM

## 2014-04-05 NOTE — Patient Instructions (Addendum)
For your breathing: Exercise regularly Take Advair twice a day no matter how you feel  For your sinuses: -use saline nasal rinses twice a day -ask the Ophthalmologist if you can take Nasacort or Flonase -if not, then fill the Rx for the Astelin and take as directed -Use phenylephrine decongestant as directed by a pharmacist  We will see you back in 6 months or sooner if needed

## 2014-04-05 NOTE — Assessment & Plan Note (Signed)
Today her simple spirometry showed no significant change in 2 years. Further, her most recent chest x-ray did not show any acute abnormality in her lung exam and ambulatory oximetry were normal.  So while she does have bronchiectasis which is a chronic, progressive respiratory disease, I don't see that there has been significant progression since her last visit.  She does need to be compliant with her Advair and this was stressed today.  However, think the most likely etiology for her shortness of breath (considering the negative cardiac workup recently) is deconditioning.  Plan: -She is going to see if pulmonary rehabilitation is available for her near her home in [place]. - Start using Advair twice a day no matter how you feel -Stay as active as possible -Followup 6 months

## 2014-04-05 NOTE — Assessment & Plan Note (Signed)
She says that she cannot take nasal steroids because of glaucoma. I asked her to clarify this with her ophthalmologist.  Plan: -If cannot use nasal steroid then start nasal antihistamine -Continue oral antihistamine -Continue saline rinses -Advised to use over-the-counter phenylephrine

## 2014-04-05 NOTE — Progress Notes (Signed)
Subjective:    Patient ID: Christy Richardson, female    DOB: 05/31/1935, 79 y.o.   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  She switched care from [facility] to [facility] in 05/2012.  Her simple spirometry in 2013 was 78% predicted.  HPI   12/23/2012 ROV -- Ms. Christy Richardson has been doing about the same since our last visit.  She says that her chest congestion may have picked up a bit since the last visit, but she has not had in sputum production. She has not had fevers chills, or weight loss. She did use the doxycycline once in the last few months for increasing congestion in her throat and said that this helped. She has not been using her flutter valve on a regular basis. She is only using Advair on an as-needed basis. She is still experiencing problems with stool incontinence.  04/05/2014 ROV >> It's been about a year since I saw Christy Richardson and she has not had bad "bactrial infections" with hemoptysis since the last visit.  She still feels short of breath with minimal activity (sometimes just walking in the kitchen, minimal activity).  She also feels like she always has something in her throat that she needs to clear.  She had a stress test, echocardiogram and other testing by Dr. Autumn Messing everything turned out OK.  She has not been taking the Advair consistently due to cost.  A few months ago she requested a new Rx.  She rarely uses it twice per day. She notices an improvement when she uses it.    She is not having a lot of mucus.  She does have hoarseness and sinus drainage.  She takes generic zyrtec once a day when symptoms are worse.  She uses saline nasal spray but no steroid sprays.       Past Medical History  Diagnosis Date  . Mitral valve prolapse   . Nonspecific abnormal electrocardiogram (ECG) (EKG)   . Fatigue   . Neck mass   . Recurrent UTI   . Fecal incontinence   . IBS (irritable bowel syndrome)    . Migraine   . Hypothyroidism   . Glaucoma   . Hypertension   . GERD (gastroesophageal reflux disease)   . Diverticulosis of colon (without mention of hemorrhage)   . Depression   . Anxiety   . Allergic rhinitis   . Pulmonary nodules   . DOE (dyspnea on exertion)   . Hyperlipidemia   . Adenomatous polyp of colon 03/2007  . Polyp, sigmoid colon   . Bronchiectasis   . Blood transfusion   . Heart murmur   . Diabetes mellitus without complication   . Tremor   . Palpitations   . Goiter      Review of Systems  Constitutional: Positive for fatigue. Negative for fever and chills.  HENT: Positive for postnasal drip, rhinorrhea and sinus pressure.   Respiratory: Positive for cough and shortness of breath. Negative for wheezing.   Cardiovascular: Negative for chest pain, palpitations and leg swelling.  4    Objective:   Physical Exam   Filed Vitals:   04/05/14 1521  BP: 142/72  Pulse: 72  Height: 5' 7.75" (1.721 m)  Weight: 149 lb (67.586 kg)  SpO2: 96%   RA  Ambulate 500 feet on room air and did not drop her oxygen saturation below 96%  Gen: well appearing, no acute distress HEENT: NCAT, EOMi, nasal  mucosa clear;  PULM: CTA B CV: RRR, no mgr, no JVD AB: BS+, soft, nontender, no hsm Ext: warm, no edema, no clubbing, no cyanosis  05/2012 simple spirometry> > Ratio 63%, FEV1 1.98L (78% pred) 11/2012 CT chest [facility] >> bilateral bronchiectasis, scattered nodules no greater than 71mm in size March 2015 simple spirometry > ratio 62%, FEV1 1.87 L (79% predicted)    Assessment & Plan:   BRONCHIECTASIS W/ACUTE EXACERBATION Today her simple spirometry showed no significant change in 2 years. Further, her most recent chest x-ray did not show any acute abnormality in her lung exam and ambulatory oximetry were normal.  So while she does have bronchiectasis which is a chronic, progressive respiratory disease, I don't see that there has been significant progression since her last  visit.  She does need to be compliant with her Advair and this was stressed today.  However, think the most likely etiology for her shortness of breath (considering the negative cardiac workup recently) is deconditioning.  Plan: -She is going to see if pulmonary rehabilitation is available for her near her home in [place]. - Start using Advair twice a day no matter how you feel -Stay as active as possible -Followup 6 months  ALLERGIC RHINITIS She says that she cannot take nasal steroids because of glaucoma. I asked her to clarify this with her ophthalmologist.  Plan: -If cannot use nasal steroid then start nasal antihistamine -Continue oral antihistamine -Continue saline rinses -Advised to use over-the-counter phenylephrine    Updated Medication List Outpatient Encounter Prescriptions as of 04/05/2014  Medication Sig  . Alpha-D-Galactosidase (BEANO) TABS Take 1 tablet by mouth as needed (for gas).   . ALPRAZolam (XANAX) 1 MG tablet Take 1 tablet (1 mg total) by mouth at bedtime as needed.  Marland Kitchen aspirin 81 MG tablet Take 81 mg by mouth daily.    . cetirizine (ZYRTEC) 10 MG tablet Take 10 mg by mouth daily.   . dorzolamide-timolol (COSOPT) 22.3-6.8 MG/ML ophthalmic solution Place 1 drop into the left eye 2 (two) times daily.    . Fluticasone-Salmeterol (ADVAIR DISKUS) 250-50 MCG/DOSE AEPB Inhale 1 puff into the lungs 2 (two) times daily.  . irbesartan-hydrochlorothiazide (AVALIDE) 150-12.5 MG per tablet Take 1 tablet by mouth daily.  Marland Kitchen levothyroxine (SYNTHROID, LEVOTHROID) 50 MCG tablet Take 50 mcg by mouth daily before breakfast.  . meloxicam (MOBIC) 7.5 MG tablet Take 7.5 mg by mouth 2 (two) times daily.  . methocarbamol (ROBAXIN) 750 MG tablet Take 750 mg by mouth daily as needed for muscle spasms.   . metoprolol (LOPRESSOR) 50 MG tablet Take 50 mg by mouth 2 (two) times daily.  . Multiple Vitamins-Minerals (MULTIVITAMIN WITH MINERALS) tablet Take 1 tablet by mouth daily.    .  naproxen (NAPROSYN) 250 MG tablet Take 250 mg by mouth daily as needed for moderate pain.  . pravastatin (PRAVACHOL) 40 MG tablet Take 1 tablet (40 mg total) by mouth at bedtime.  . sertraline (ZOLOFT) 100 MG tablet Take 1 tablet (100 mg total) by mouth at bedtime. 2 at bedtime  . sodium chloride (OCEAN NASAL SPRAY) 0.65 % nasal spray Place 2 sprays into both nostrils at bedtime. Use as needed  . traZODone (DESYREL) 50 MG tablet Take 50 mg by mouth at bedtime.    . [DISCONTINUED] bimatoprost (LUMIGAN) 0.01 % SOLN Place 1 drop into the left eye at bedtime.  . [DISCONTINUED] Difluprednate (DUREZOL) 0.05 % EMUL Apply 1 drop to eye 3 (three) times daily.  . [DISCONTINUED] HYDROcodone-acetaminophen (VICODIN)  5-500 MG per tablet 1 tablet every 6 (six) hours as needed for pain.

## 2014-04-13 LAB — COMPREHENSIVE METABOLIC PANEL
ALT: 13 U/L (ref 0–35)
AST: 20 U/L (ref 0–37)
Albumin: 3.8 g/dL (ref 3.5–5.2)
Alkaline Phosphatase: 51 U/L (ref 39–117)
BILIRUBIN TOTAL: 0.6 mg/dL (ref 0.2–1.2)
BUN: 19 mg/dL (ref 6–23)
CO2: 26 meq/L (ref 19–32)
CREATININE: 1.01 mg/dL (ref 0.50–1.10)
Calcium: 8.9 mg/dL (ref 8.4–10.5)
Chloride: 96 mEq/L (ref 96–112)
Glucose, Bld: 106 mg/dL — ABNORMAL HIGH (ref 70–99)
Potassium: 3.9 mEq/L (ref 3.5–5.3)
Sodium: 133 mEq/L — ABNORMAL LOW (ref 135–145)
Total Protein: 6.1 g/dL (ref 6.0–8.3)

## 2014-04-23 ENCOUNTER — Encounter: Payer: Self-pay | Admitting: *Deleted

## 2014-04-23 ENCOUNTER — Telehealth: Payer: Self-pay | Admitting: *Deleted

## 2014-04-23 NOTE — Telephone Encounter (Signed)
Mailed letter informing patient of lab results.

## 2014-05-05 ENCOUNTER — Telehealth: Payer: Self-pay | Admitting: Cardiovascular Disease

## 2014-05-05 NOTE — Telephone Encounter (Signed)
Still having problems with the new bp medciation Irbesartan/HCTZ 150/12.5 mg..it was reduced from 300mg  to 150 mg . Still have been feeling weak , feeling bad and still having a lot of dizziness and unsteady on her feet. So se went back to taking her Lorsartan .Marland KitchenPlease call   Thanks

## 2014-05-05 NOTE — Telephone Encounter (Signed)
Returned call to patient. She states that over the weekend she decided to go back to taking losartan 100mg  QD instead of irbesartan -hct which was making her feel dizzy, tired, and have balance issues. She reports improvements in all these symptoms since she went back to taking losartan.   BP readings 6/2 8am 124/60 HR 72 220pm 105/49 HR 67 845pm 125/69 HR 83  BP readings 6/3 AM 131/64 HR 69  She reports her BP was about the same when she was taking irbesartan-hct, she just had bad SE from this medication.   Will defer to Dr. Arnette Norris, CMA to advise on medication issue

## 2014-05-07 NOTE — Telephone Encounter (Signed)
ok 

## 2014-05-10 NOTE — Telephone Encounter (Signed)
Patient notified that Dr. Claiborne Billings reviewed this information and is OK to take medications as she is currently. She will notify our office if she has any future BP issues.

## 2014-07-05 ENCOUNTER — Other Ambulatory Visit (HOSPITAL_COMMUNITY): Payer: Self-pay | Admitting: Internal Medicine

## 2014-07-05 DIAGNOSIS — Z139 Encounter for screening, unspecified: Secondary | ICD-10-CM

## 2014-07-09 ENCOUNTER — Inpatient Hospital Stay (HOSPITAL_COMMUNITY): Admission: RE | Admit: 2014-07-09 | Payer: Medicare Other | Source: Ambulatory Visit

## 2014-07-14 ENCOUNTER — Ambulatory Visit (HOSPITAL_COMMUNITY)
Admission: RE | Admit: 2014-07-14 | Discharge: 2014-07-14 | Disposition: A | Discharge status: Home / Self Care | Payer: Medicare Other | Source: Ambulatory Visit | Attending: Internal Medicine | Admitting: Internal Medicine

## 2014-07-14 DIAGNOSIS — Z1231 Encounter for screening mammogram for malignant neoplasm of breast: Secondary | ICD-10-CM | POA: Diagnosis not present

## 2014-07-14 DIAGNOSIS — Z0389 Encounter for observation for other suspected diseases and conditions ruled out: Secondary | ICD-10-CM | POA: Diagnosis present

## 2014-07-14 DIAGNOSIS — Z139 Encounter for screening, unspecified: Secondary | ICD-10-CM

## 2014-07-30 ENCOUNTER — Encounter: Payer: Self-pay | Admitting: Cardiovascular Disease

## 2014-07-30 ENCOUNTER — Telehealth: Payer: Self-pay | Admitting: Cardiovascular Disease

## 2014-08-02 ENCOUNTER — Telehealth: Payer: Self-pay | Admitting: Pulmonary Disease

## 2014-08-02 MED ORDER — FLUTICASONE-SALMETEROL 250-50 MCG/DOSE IN AEPB: 1.0000 | INHALATION_SPRAY | Freq: Two times a day (BID) | RESPIRATORY_TRACT | Status: DC

## 2014-08-02 NOTE — Telephone Encounter (Signed)
Pt is aware advair sent to the pharm. Nothing further needed

## 2014-08-02 NOTE — Telephone Encounter (Signed)
Closed encounter °

## 2014-08-12 ENCOUNTER — Ambulatory Visit: Payer: Medicare Other | Admitting: Cardiovascular Disease

## 2014-09-15 ENCOUNTER — Encounter: Payer: Self-pay | Admitting: Pulmonary Disease

## 2014-09-15 ENCOUNTER — Ambulatory Visit: Payer: Medicare Other | Admitting: Pulmonary Disease

## 2014-09-15 ENCOUNTER — Ambulatory Visit (INDEPENDENT_AMBULATORY_CARE_PROVIDER_SITE_OTHER): Payer: Medicare Other | Admitting: Pulmonary Disease

## 2014-09-15 VITALS — BP 116/58 | HR 72 | Ht 67.5 in | Wt 147.0 lb

## 2014-09-15 DIAGNOSIS — J302 Other seasonal allergic rhinitis: Secondary | ICD-10-CM

## 2014-09-15 DIAGNOSIS — J479 Bronchiectasis, uncomplicated: Secondary | ICD-10-CM

## 2014-09-15 MED ORDER — FLUTICASONE-SALMETEROL 250-50 MCG/DOSE IN AEPB: 1.0000 | INHALATION_SPRAY | Freq: Two times a day (BID) | RESPIRATORY_TRACT | Status: DC

## 2014-09-15 NOTE — Patient Instructions (Signed)
Keep taking your medicines as you are doing We will see you back in April 2015

## 2014-09-15 NOTE — Assessment & Plan Note (Signed)
This has been a stable interval for Christy Richardson. She is doing well on the Advair.  Plan: -Flu shot -Continue Advair - she was provided with a prescription for doxycycline in case she has a respiratory infection -Followup 6 months, will get simple spirometry on that visit

## 2014-09-15 NOTE — Assessment & Plan Note (Signed)
This has been a stable interval for Christy Richardson. She should continue taking her regimen of an antihistamine and nasal steroid.

## 2014-09-15 NOTE — Progress Notes (Signed)
Subjective:    Patient ID: Christy Richardson, female    DOB: 1935-04-26, 79 y.o.   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  She switched care from [facility] to [facility] (Pulmonary) in 05/2012.  Her simple spirometry in 2013 was 78% predicted.  HPI  Chief Complaint  Patient presents with  . Follow-up    Pt c/o SOB with exertion, sometimes prod cough with clear/white mucus.     09/15/2014 ROV> Christy Richardson has been doing OK since the last visit.  She has dyspnea with exertion and with housework  She takes breaks with work but in general things haven't gotten worse in the last year.  She is still active with yardwork.  She has hoarseness which she attributes to her sinus drainage.  She takes mucinex, generic zyrtec and she is taking nasacort.  She is not getting up much mucus, not even every day.  This correlates with sinus drainage well.     Past Medical History  Diagnosis Date  . Mitral valve prolapse   . Nonspecific abnormal electrocardiogram (ECG) (EKG)   . Fatigue   . Neck mass   . Recurrent UTI   . Fecal incontinence   . IBS (irritable bowel syndrome)   . Migraine   . Hypothyroidism   . Glaucoma   . Hypertension   . GERD (gastroesophageal reflux disease)   . Diverticulosis of colon (without mention of hemorrhage)   . Depression   . Anxiety   . Allergic rhinitis   . Pulmonary nodules   . DOE (dyspnea on exertion)   . Hyperlipidemia   . Adenomatous polyp of colon 03/2007  . Polyp, sigmoid colon   . Bronchiectasis   . Blood transfusion   . Heart murmur   . Diabetes mellitus without complication   . Tremor   . Palpitations   . Goiter      Review of Systems  Constitutional: Negative for fever, chills and fatigue.  HENT: Negative for postnasal drip, rhinorrhea and sinus pressure.   Respiratory: Positive for shortness of breath. Negative for cough and wheezing.   Cardiovascular: Negative for chest  pain, palpitations and leg swelling.  4    Objective:   Physical Exam   Filed Vitals:   09/15/14 1143  BP: 116/58  Pulse: 72  Height: 5' 7.5" (1.715 m)  Weight: 147 lb (66.679 kg)  SpO2: 96%   RA   Gen: well appearing, no acute distress HEENT: NCAT, EOMi, oropharynx clear without thrush PULM: CTA B CV: RRR, no mgr, no JVD AB: BS+, soft, nontender,  Ext: warm, no edema, no clubbing, no cyanosis  05/2012 simple spirometry> > Ratio 63%, FEV1 1.98L (78% pred) 11/2012 CT chest [facility] >> bilateral bronchiectasis, scattered nodules no greater than 80mm in size March 2015 simple spirometry > ratio 62%, FEV1 1.87 L (79% predicted)    Assessment & Plan:   Bronchiectasis without acute exacerbation  This has been a stable interval for Christy Richardson. She is doing well on the Advair.  Plan: -Flu shot -Continue Advair - she was provided with a prescription for doxycycline in case she has a respiratory infection -Followup 6 months, will get simple spirometry on that visit  Allergic rhinitis This has been a stable interval for Christy Richardson. She should continue taking her regimen of an antihistamine and nasal steroid.    Updated Medication List Outpatient Encounter Prescriptions as of 09/15/2014  Medication Sig  . Alpha-D-Galactosidase (BEANO) TABS  Take 1 tablet by mouth as needed (for gas).   . ALPRAZolam (XANAX) 1 MG tablet Take 1 tablet (1 mg total) by mouth at bedtime as needed.  Marland Kitchen aspirin 81 MG tablet Take 81 mg by mouth daily.    . cetirizine (ZYRTEC) 10 MG tablet Take 10 mg by mouth daily.   . Fluticasone-Salmeterol (ADVAIR DISKUS) 250-50 MCG/DOSE AEPB Inhale 1 puff into the lungs 2 (two) times daily.  . irbesartan-hydrochlorothiazide (AVALIDE) 150-12.5 MG per tablet Take 1 tablet by mouth daily.  Marland Kitchen levothyroxine (SYNTHROID, LEVOTHROID) 50 MCG tablet Take 50 mcg by mouth daily before breakfast.  . metoprolol (LOPRESSOR) 50 MG tablet Take 50 mg by mouth 2 (two) times daily.  . Multiple  Vitamins-Minerals (MULTIVITAMIN WITH MINERALS) tablet Take 1 tablet by mouth daily.    . naproxen (NAPROSYN) 250 MG tablet Take 250 mg by mouth daily as needed for moderate pain.  . pravastatin (PRAVACHOL) 40 MG tablet Take 1 tablet (40 mg total) by mouth at bedtime.  . sertraline (ZOLOFT) 100 MG tablet Take 1 tablet (100 mg total) by mouth at bedtime. 2 at bedtime  . sodium chloride (OCEAN NASAL SPRAY) 0.65 % nasal spray Place 2 sprays into both nostrils at bedtime. Use as needed  . traMADol (ULTRAM) 50 MG tablet Take 50 mg by mouth 2 (two) times daily as needed.  . traZODone (DESYREL) 50 MG tablet Take 50 mg by mouth at bedtime.    . [DISCONTINUED] dorzolamide-timolol (COSOPT) 22.3-6.8 MG/ML ophthalmic solution Place 1 drop into the left eye 2 (two) times daily.    . [DISCONTINUED] meloxicam (MOBIC) 7.5 MG tablet Take 7.5 mg by mouth 2 (two) times daily.  . [DISCONTINUED] methocarbamol (ROBAXIN) 750 MG tablet Take 750 mg by mouth daily as needed for muscle spasms.

## 2014-09-29 ENCOUNTER — Ambulatory Visit: Payer: Medicare Other | Admitting: Cardiovascular Disease

## 2015-01-04 ENCOUNTER — Ambulatory Visit: Payer: Self-pay | Admitting: Ophthalmology

## 2015-01-18 ENCOUNTER — Ambulatory Visit: Payer: Self-pay | Admitting: Ophthalmology

## 2015-04-03 NOTE — Op Note (Signed)
PATIENT NAME:  Christy Richardson, Christy Richardson MR#:  [id number] DATE OF BIRTH:  1935-11-24  DATE OF PROCEDURE:  01/18/2015  PREOPERATIVE DIAGNOSIS:  Nuclear sclerotic cataract of the right eye.   POSTOPERATIVE DIAGNOSIS:  Nuclear sclerotic cataract of the right eye.   OPERATIVE PROCEDURE:  Cataract extraction by phacoemulsification with implant of intraocular lens to right eye.   SURGEON:  Livingston Diones. Candler Ginsberg, MD  ANESTHESIA:  1. Managed anesthesia care.  2. Topical tetracaine drops followed by 2% Xylocaine jelly applied in the preoperative holding area.   COMPLICATIONS:  None.   TECHNIQUE:  Stop and chop.  DESCRIPTION OF PROCEDURE:  The patient was examined and consented in the preoperative holding area where the aforementioned topical anesthesia was applied to the right eye and then brought back to the operating room, where the right eye was prepped and draped in the usual sterile ophthalmic fashion and a lid speculum was placed. A paracentesis was created with the side port blade and the anterior chamber was filled with viscoelastic. A near clear corneal incision was performed with the steel keratome. A continuous curvilinear capsulorrhexis was performed with a cystotome followed by the capsulorrhexis forceps. Hydrodissection and hydrodelineation were carried out with BSS on a blunt cannula. The lens was removed in a stop and chop technique and the remaining cortical material was removed with the irrigation-aspiration handpiece. The capsular bag was inflated with viscoelastic and the Tecnis ZCB00, 23.5-diopter lens, serial number [id number], was placed in the capsular bag without complication. The remaining viscoelastic was removed from the eye with the irrigation-aspiration handpiece. The wounds were hydrated. The anterior chamber was flushed with Miostat and the eye was inflated to physiologic pressure. 0.1 mL of cefuroxime concentration 10 mg/mL was placed in the anterior chamber. The wounds were found to be  water tight. The eye was dressed with Vigamox. The patient was given protective glasses to wear throughout the day and a shield with which to sleep tonight. The patient was also given drops with which to begin a drop regimen today and will follow up with me in one day.    ____________________________ Livingston Diones. Jahlen Bollman, MD wlp:nb D: 01/18/2015 21:33:48 ET T: 01/19/2015 06:19:25 ET JOB#: [id number]  cc: Cyndie Woodbeck L. Marcellis Frampton, MD, <Dictator> Livingston Diones Joanell Cressler MD ELECTRONICALLY SIGNED 01/27/2015 10:30

## 2015-04-14 ENCOUNTER — Telehealth: Payer: Self-pay | Admitting: Pulmonary Disease

## 2015-04-14 NOTE — Telephone Encounter (Signed)
lmtcb x1 for pt. 

## 2015-04-15 MED ORDER — FLUTICASONE-SALMETEROL 250-50 MCG/DOSE IN AEPB: 1.0000 | INHALATION_SPRAY | Freq: Two times a day (BID) | RESPIRATORY_TRACT | Status: DC

## 2015-04-15 NOTE — Telephone Encounter (Signed)
Rx has been sent in. Pt is aware. Nothing further was needed. 

## 2015-04-15 NOTE — Telephone Encounter (Signed)
Pt returned call [phone]

## 2015-05-04 ENCOUNTER — Ambulatory Visit: Payer: Self-pay | Admitting: Pulmonary Disease

## 2015-05-05 ENCOUNTER — Ambulatory Visit (INDEPENDENT_AMBULATORY_CARE_PROVIDER_SITE_OTHER): Payer: PPO | Admitting: Pulmonary Disease

## 2015-05-05 ENCOUNTER — Encounter: Payer: Self-pay | Admitting: Pulmonary Disease

## 2015-05-05 VITALS — BP 130/68 | HR 69 | Ht 67.5 in | Wt 146.0 lb

## 2015-05-05 DIAGNOSIS — R0600 Dyspnea, unspecified: Secondary | ICD-10-CM | POA: Diagnosis not present

## 2015-05-05 DIAGNOSIS — J479 Bronchiectasis, uncomplicated: Secondary | ICD-10-CM

## 2015-05-05 MED ORDER — FLUTTER DEVI: Status: DC

## 2015-05-05 NOTE — Patient Instructions (Signed)
Use guaifenesin 400 mg 3 times a day Use the flutter valve 3-4 times a day to help reduce mucus Take the Advair twice a day no matter how you feel for 2 or 3 weeks and then call us to let us know if this has improved her breathing, if so then we will apply for financial assistance for the Advair. Otherwise if it makes no difference than continue taking it once a day We will see you back in 6 months or sooner if needed

## 2015-05-05 NOTE — Progress Notes (Signed)
Subjective:    Patient ID: Christy Richardson, female    DOB: [birth year], 79 y.o.   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  She switched care from [facility] to [facility] (Pulmonary) in 05/2012.  Her simple spirometry in 2013 was 78% predicted.  HPI Chief Complaint  Patient presents with  . Follow-up    Pt c/o constant SOB, worse with humid weather.  Also has mostly nonprod "hacky" cough.     Christy Richardson says she has been slowing down a little due to dyspnea.  She is still performing all ADL's at home except for cutting the grass.  She still does some yard work.  She has some throat congestion but not mucus production.  She has a "hacky cough".  She has sinus congestion and takes zyrtec. She has not been using her flutter valve recently because it has not been working. She takes Mucinex from time to time. She has noted that she has a rattle in her throat when she coughs recently. She denies fevers or chills. She has not been treated for bronchitis since she saw me the last time.  Past Medical History  Diagnosis Date  . Mitral valve prolapse   . Nonspecific abnormal electrocardiogram (ECG) (EKG)   . Fatigue   . Neck mass   . Recurrent UTI   . Fecal incontinence   . IBS (irritable bowel syndrome)   . Migraine   . Hypothyroidism   . Glaucoma   . Hypertension   . GERD (gastroesophageal reflux disease)   . Diverticulosis of colon (without mention of hemorrhage)   . Depression   . Anxiety   . Allergic rhinitis   . Pulmonary nodules   . DOE (dyspnea on exertion)   . Hyperlipidemia   . Adenomatous polyp of colon 03/2007  . Polyp, sigmoid colon   . Bronchiectasis   . Blood transfusion   . Heart murmur   . Diabetes mellitus without complication   . Tremor   . Palpitations   . Goiter      Review of Systems  Constitutional: Negative for fever, chills and fatigue.  HENT: Negative for postnasal drip, rhinorrhea  and sinus pressure.   Respiratory: Positive for shortness of breath. Negative for cough and wheezing.   Cardiovascular: Negative for chest pain, palpitations and leg swelling.  4    Objective:   Physical Exam  Filed Vitals:   05/05/15 1105  BP: 130/68  Pulse: 69  Height: 5' 7.5" (1.715 m)  Weight: 146 lb (66.225 kg)  SpO2: 96%   RA   Gen: well appearing, no acute distress HEENT: NCAT, EOMi, oropharynx clear without thrush PULM: Few crackles right base, otherwise clear normal effort CV: RRR, no mgr, no JVD AB: BS+, soft, nontender,  Ext: warm, no edema, no clubbing, no cyanosis  05/2012 simple spirometry> > Ratio 63%, FEV1 1.98L (78% pred) 11/2012 CT chest [facility] >> bilateral bronchiectasis, scattered nodules no greater than 40mm in size March 2015 simple spirometry > ratio 62%, FEV1 1.87 L (79% predicted) February 2015 chest x-ray images reviewed: Elevated right hemidiaphragm, some chronic bronchitic changes, otherwise clear 05/05/2015 simple spirometry> ratio 60%, FEV1 1.79 L (77% predicted).    Assessment & Plan:   Bronchiectasis without acute exacerbation This has been a stable interval without exacerbation. However, she has had some slight decline in her symptoms in the last few months and that she feels a bit more short of breath.  On exam today I can hear more mucus particularly in the right base of her lung. She has not been doing mucociliary clearance lately because her flutter valve is not working. She also is only taking Advair at half of the recommended daily dose.  Plan: Spirometry today Recommended that she use Advair twice a day Use guaifenesin 3 times a day plus a flutter valve 3 times a day, we will replace the flutter valve Follow-up 6 months or sooner if needed     Updated Medication List Outpatient Encounter Prescriptions as of 05/05/2015  Medication Sig  . Alpha-D-Galactosidase (BEANO) TABS Take 1 tablet by mouth as needed (for gas).   . ALPRAZolam  (XANAX) 1 MG tablet Take 1 tablet (1 mg total) by mouth at bedtime as needed.  Marland Kitchen aspirin 81 MG tablet Take 81 mg by mouth daily.    . cetirizine (ZYRTEC) 10 MG tablet Take 10 mg by mouth daily.   . Fluticasone-Salmeterol (ADVAIR DISKUS) 250-50 MCG/DOSE AEPB Inhale 1 puff into the lungs 2 (two) times daily.  . irbesartan-hydrochlorothiazide (AVALIDE) 150-12.5 MG per tablet Take 1 tablet by mouth daily.  Marland Kitchen levothyroxine (SYNTHROID, LEVOTHROID) 50 MCG tablet Take 50 mcg by mouth daily before breakfast.  . metoprolol (LOPRESSOR) 50 MG tablet Take 50 mg by mouth 2 (two) times daily.  . Multiple Vitamins-Minerals (MULTIVITAMIN WITH MINERALS) tablet Take 1 tablet by mouth daily.    . naproxen (NAPROSYN) 250 MG tablet Take 250 mg by mouth daily as needed for moderate pain.  . pravastatin (PRAVACHOL) 40 MG tablet Take 1 tablet (40 mg total) by mouth at bedtime.  . sertraline (ZOLOFT) 100 MG tablet Take 1 tablet (100 mg total) by mouth at bedtime. 2 at bedtime  . sodium chloride (OCEAN NASAL SPRAY) 0.65 % nasal spray Place 2 sprays into both nostrils at bedtime. Use as needed  . traMADol (ULTRAM) 50 MG tablet Take 50 mg by mouth 2 (two) times daily as needed.  . traZODone (DESYREL) 50 MG tablet Take 50 mg by mouth at bedtime.    Marland Kitchen Respiratory Therapy Supplies (FLUTTER) DEVI Use as directed.   No facility-administered encounter medications on file as of 05/05/2015.

## 2015-05-05 NOTE — Assessment & Plan Note (Signed)
This has been a stable interval without exacerbation. However, she has had some slight decline in her symptoms in the last few months and that she feels a bit more short of breath. On exam today I can hear more mucus particularly in the right base of her lung. She has not been doing mucociliary clearance lately because her flutter valve is not working. She also is only taking Advair at half of the recommended daily dose.  Plan: Spirometry today Recommended that she use Advair twice a day Use guaifenesin 3 times a day plus a flutter valve 3 times a day, we will replace the flutter valve Follow-up 6 months or sooner if needed

## 2015-05-25 ENCOUNTER — Telehealth: Payer: Self-pay | Admitting: Pulmonary Disease

## 2015-05-25 NOTE — Telephone Encounter (Signed)
Patient says she saw Dr. Lake Bells 2 weeks ago.  He told her to use the Advair HFA inhaler twice daily. Advair seems to be working well.  Would like to fill out paperwork to get financial assistance.  Advised patient I would mail paperwork to her to complete.  Advised her on how to complete the forms and that we would be happy to submit the forms if she wants to send them back to [place].   Forms mailed to patient. Nothing further needed.

## 2015-05-31 ENCOUNTER — Telehealth: Payer: Self-pay | Admitting: Pulmonary Disease

## 2015-05-31 MED ORDER — FLUTICASONE-SALMETEROL 250-50 MCG/DOSE IN AEPB: 1.0000 | INHALATION_SPRAY | Freq: Two times a day (BID) | RESPIRATORY_TRACT | Status: DC

## 2015-05-31 NOTE — Telephone Encounter (Signed)
Spoke with patient, she does not have any way to bring the forms back to [place], she will mail them to [place].  Nothing further needed.

## 2015-05-31 NOTE — Telephone Encounter (Signed)
LM on VM that rx was sent to pharmacy and to call back with any further questions.

## 2015-05-31 NOTE — Telephone Encounter (Signed)
ATC fast busy signal WCB 

## 2015-07-12 ENCOUNTER — Other Ambulatory Visit (HOSPITAL_COMMUNITY): Payer: Self-pay | Admitting: Internal Medicine

## 2015-07-12 DIAGNOSIS — Z1231 Encounter for screening mammogram for malignant neoplasm of breast: Secondary | ICD-10-CM

## 2015-07-18 ENCOUNTER — Ambulatory Visit (HOSPITAL_COMMUNITY)
Admission: RE | Admit: 2015-07-18 | Discharge: 2015-07-18 | Disposition: A | Discharge status: Home / Self Care | Payer: PPO | Source: Ambulatory Visit | Attending: Internal Medicine | Admitting: Internal Medicine

## 2015-07-18 DIAGNOSIS — Z1231 Encounter for screening mammogram for malignant neoplasm of breast: Secondary | ICD-10-CM

## 2015-08-05 ENCOUNTER — Telehealth: Payer: Self-pay | Admitting: Pulmonary Disease

## 2015-08-05 MED ORDER — FLUTICASONE-SALMETEROL 250-50 MCG/DOSE IN AEPB: 1.0000 | INHALATION_SPRAY | Freq: Two times a day (BID) | RESPIRATORY_TRACT | Status: DC

## 2015-08-05 NOTE — Telephone Encounter (Signed)
Patient says that she called in refill of Advair the beginning of the week.  She says that pharmacy has requested several times, but we have not responded.  Do not see in chart where pharmacy had contacted [place] to refill medication. Refilled medication. Patient notified. Nothing further needed.

## 2015-08-16 ENCOUNTER — Encounter: Payer: Self-pay | Admitting: Cardiovascular Disease

## 2015-08-16 ENCOUNTER — Ambulatory Visit (INDEPENDENT_AMBULATORY_CARE_PROVIDER_SITE_OTHER): Payer: PPO | Admitting: Cardiovascular Disease

## 2015-08-16 VITALS — BP 148/88 | HR 72 | Ht 68.0 in | Wt 146.5 lb

## 2015-08-16 DIAGNOSIS — I5189 Other ill-defined heart diseases: Secondary | ICD-10-CM

## 2015-08-16 DIAGNOSIS — I10 Essential (primary) hypertension: Secondary | ICD-10-CM

## 2015-08-16 DIAGNOSIS — E785 Hyperlipidemia, unspecified: Secondary | ICD-10-CM | POA: Diagnosis not present

## 2015-08-16 DIAGNOSIS — I059 Rheumatic mitral valve disease, unspecified: Secondary | ICD-10-CM | POA: Diagnosis not present

## 2015-08-16 DIAGNOSIS — E039 Hypothyroidism, unspecified: Secondary | ICD-10-CM

## 2015-08-16 DIAGNOSIS — I519 Heart disease, unspecified: Secondary | ICD-10-CM

## 2015-08-16 NOTE — Progress Notes (Signed)
Patient ID: Christy Richardson, female   DOB: 1935-05-21, 80 y.o.   MRN: [id number]     HPI: Christy Richardson is a 80 y.o. female who presents to the office today for an 50  month cardiology evaluation.  Christy Richardson has a history of mitral valve prolapse with palpitations, bronchiectasis, GERD, goirter, mixed hyperlipidemia, as well as hypertension with documented grade 1 diastolic dysfunction. Dr. Lake Bells follows her bronchiectasis. In October 2012 a nuclear perfusion study revealed normal perfusion and an echo Doppler study revealed normal systolic function with grade 1 diastolic dysfunction, mild aortic valve sclerosis without stenosis, trace AR, MR, and mild TR as well as PR. She had upper normal RV pressure 26 mm.d  She has a 30 year history of hypertension. She also has GERD. She has experienced no episodes of chest tightness.  She also is on pravastatin for hyperlipidemia. She was evaluated at the emergency room at [facility]. I reviewed the records and apparently her blood pressure that day was 158/110. She also noted episodes of chest tightness. Her EKG was unremarkable. She was treated with albuterol and ipratropium. Chest x-ray did not show evidence for pneumonia. She was advised to resume her GI medication and she also was given a prescription for oseltamivir to treat possible influenza.  She underwent an echo Doppler study on 02/02/2014.  This showed an ejection fraction of 60-65%.  She had grade 1 diastolic dysfunction.  There was mild aortic sclerosis without stenosis.  A lexiscan perfusion study on 02/04/2014 was normal without scar or ischemia.  Post stress ejection fraction was 73%.  She did wear a CardioNet monitor to assess for palpitations.  Unfortunately, she developed significant skin irritation, and only wore this for 9 days.  This revealed episodes of sinus rhythm, but she did have episodes of sinus tachycardia with rates in the low 100s.  There was there were no episodes of SVT or  atrial fibrillation.  Over the past year, she has remained fairly stable.  She denies chest pain.  However, she brought with her records of her blood pressure readings at home.  She has had some blood pressure lability and has been taking losartan 100 mg in the morning and metoprolol 50 g twice a day.  Her blood pressure typically is elevated when she wakes up and it may be as high as 160, but several hours later it may be low down into the 90s to 100s.  She is unaware of any arrhythmia.  She is on Pravachol 40 mg for hyperlipidemia.  She takes blocks accountant on an as-needed basis 436 symptoms.  She has hypothyroidism on Synthroid 50 g.  She has a history of depression for which she takes Zoloft and also takes trazodone at bedtime.  She tells me she will be seeing Dr. Wende Neighbors next week and a complete set of blood work will be obtained at that time.   Past Medical History  Diagnosis Date  . Mitral valve prolapse   . Nonspecific abnormal electrocardiogram (ECG) (EKG)   . Fatigue   . Neck mass   . Recurrent UTI   . Fecal incontinence   . IBS (irritable bowel syndrome)   . Migraine   . Hypothyroidism   . Glaucoma   . Hypertension   . GERD (gastroesophageal reflux disease)   . Diverticulosis of colon (without mention of hemorrhage)   . Depression   . Anxiety   . Allergic rhinitis   . Pulmonary nodules   .  DOE (dyspnea on exertion)   . Hyperlipidemia   . Adenomatous polyp of colon 03/2007  . Polyp, sigmoid colon   . Bronchiectasis   . Blood transfusion   . Heart murmur   . Diabetes mellitus without complication   . Tremor   . Palpitations   . Goiter     Past Surgical History  Procedure Laterality Date  . Abdominal hysterectomy    . Oophorectomy    . Mastoidectomy      rt ear  . Total hip arthroplasty  5/10    left  . Colonoscopy    . Esophagogastroduodenoscopy    . Cardiovascular stress test  09/19/2011    No scintigraphic evidence of inducible myocardial ischemia.  Pharmacological stress test without chest pain or EKG changes for ischemia.  . Transthoracic echocardiogram  09/19/2011    EF >12%, stage 1 diastolic dysfunction, mild tricuspid valve regurg    Allergies  Allergen Reactions  . Penicillins     REACTION: Rash  . Prednisone     REACTION: Increase eye pressure with gloucoma. Broke out in rash after injection per Dr. Percell Miller  . Sulfonamide Derivatives     REACTION: Rash - not sure    Current Outpatient Prescriptions  Medication Sig Dispense Refill  . Alpha-D-Galactosidase (BEANO) TABS Take 1 tablet by mouth as needed (for gas).     . ALPRAZolam (XANAX) 1 MG tablet Take 1 tablet (1 mg total) by mouth at bedtime as needed.    Marland Kitchen aspirin 81 MG tablet Take 81 mg by mouth daily.      . cetirizine (ZYRTEC) 10 MG tablet Take 10 mg by mouth daily.     . Fluticasone-Salmeterol (ADVAIR DISKUS) 250-50 MCG/DOSE AEPB Inhale 1 puff into the lungs 2 (two) times daily. 60 each 3  . levothyroxine (SYNTHROID, LEVOTHROID) 50 MCG tablet Take 50 mcg by mouth daily before breakfast.    . LORazepam (ATIVAN) 0.5 MG tablet Take 0.5 mg by mouth 2 (two) times daily as needed (tremors).    . losartan (COZAAR) 100 MG tablet Take 50 mg by mouth 2 (two) times daily.     . meloxicam (MOBIC) 15 MG tablet Take 15 mg by mouth daily.    . metoprolol (LOPRESSOR) 50 MG tablet Take 50 mg by mouth 2 (two) times daily.    . Multiple Vitamins-Minerals (MULTIVITAMIN WITH MINERALS) tablet Take 1 tablet by mouth daily.      . naproxen (NAPROSYN) 250 MG tablet Take 250 mg by mouth daily as needed for moderate pain.    . pravastatin (PRAVACHOL) 40 MG tablet Take 1 tablet (40 mg total) by mouth at bedtime.    Marland Kitchen Respiratory Therapy Supplies (FLUTTER) DEVI Use as directed. 1 each 0  . sertraline (ZOLOFT) 100 MG tablet Take 1 tablet (100 mg total) by mouth at bedtime. 2 at bedtime    . sodium chloride (OCEAN NASAL SPRAY) 0.65 % nasal spray Place 2 sprays into both nostrils at bedtime. Use as  needed    . traMADol (ULTRAM) 50 MG tablet Take 50 mg by mouth 2 (two) times daily as needed (pain).     . traZODone (DESYREL) 50 MG tablet Take 50 mg by mouth at bedtime.      . triamcinolone (NASACORT ALLERGY 24HR) 55 MCG/ACT AERO nasal inhaler Place 1 spray into the nose daily.     No current facility-administered medications for this visit.    Social History   Social History  . Marital Status: Widowed  Spouse Name: N/A  . Number of Children: 2  . Years of Education: 10th grade   Occupational History  . Retired     Scientist, research (medical)   Social History Main Topics  . Smoking status: Never Smoker   . Smokeless tobacco: Never Used  . Alcohol Use: No  . Drug Use: No  . Sexual Activity: Not on file   Other Topics Concern  . Not on file   Social History Narrative   Widowed since 2002. Lives with her Daughter.   Socially she is widowed and has 2 children. No tobacco use. She does not routinely exercise.  Family History  Problem Relation Age of Onset  . Diabetes Father   . Throat cancer Mother   . Coronary artery disease Mother   . Prostate cancer Brother   . Asthma Daughter   . Scoliosis Daughter   . Esophageal cancer Neg Hx   . Rectal cancer Neg Hx   . Stomach cancer Neg Hx   . Colon cancer Paternal Grandfather    ROS General: Negative; No fevers, chills, or night sweats;  HEENT: Negative; No changes in vision or hearing, sinus congestion, difficulty swallowing Pulmonary: Positive for bronchiectasis; No cough, wheezing, shortness of breath, hemoptysis Cardiovascular: Negative; No chest pain, presyncope, syncope, palpitations GI: Positive for GERD; No nausea, vomiting, diarrhea, or abdominal pain GU: Negative; No dysuria, hematuria, or difficulty voiding Musculoskeletal: Positive for occasional arthritic symptoms in her neck. Hematologic/Oncology: Negative; no easy bruising, bleeding Endocrine: Positive for hypothyroidism on Synthroid replacement Neuro: Negative; no changes  in balance, headaches Skin: Negative; No rashes or skin lesions Psychiatric: Negative; No behavioral problems, depression Sleep: She does not sleep well.; No snoring, daytime sleepiness, hypersomnolence, bruxism, restless legs, hypnogognic hallucinations, no cataplexy Other comprehensive 14 point system review is negative.   PE BP 148/88 mmHg  Pulse 72  Ht '5\' 8"'  (1.727 m)  Wt 146 lb 8 oz (66.452 kg)  BMI 22.28 kg/m2  Repeat blood pressure by me was 160/86 supine and 152/84 standing  Wt Readings from Last 3 Encounters:  08/16/15 146 lb 8 oz (66.452 kg)  05/05/15 146 lb (66.225 kg)  09/15/14 147 lb (66.679 kg)   General: Alert, oriented, no distress.  Skin: normal turgor, no rashes HEENT: Normocephalic, atraumatic. Pupils round and reactive; sclera anicteric;no lid lag. Extraocular muscles intact;; no xanthelasmas. Nose without nasal septal hypertrophy Mouth/Parynx benign; Mallinpatti scale 3 Neck: No JVD, no carotid bruits; normal carotid upstroke Lungs: clear to ausculatation and percussion; no wheezing or rales Chest wall: no tenderness to palpitation Heart: RRR, s1 s2 normal; 1/6 systolic murmur along the LSB. no diastolic murmur, rub thrills or heaves Abdomen: soft, nontender; no hepatosplenomehaly, BS+; abdominal aorta nontender and not dilated by palpation. Back: no CVA tenderness Pulses 2+ Extremities: no clubbing cyanosis or edema, Homan's sign negative  Neurologic: grossly nonfocal; cranial nerves grossly normal. Psychologic: normal affect and mood.  ECG (independently read by me): Normal sinus rhythm at 72 bpm.  Nonspecific ST changes.  Q wave in lead aVL   Prior ECG (independently read by me): Sinus rhythm at 72 beats per minute. Small Q-wave in lead aVL. No significant ST changes.  LABS: BMP Latest Ref Rng 04/12/2014 01/18/2014 04/22/2009  Glucose 70 - 99 mg/dL 106(H) 128(H) 125(H)  BUN 6 - 23 mg/dL 19 16 3(L)  Creatinine 0.50 - 1.10 mg/dL 1.01 0.85 0.83  Sodium  135 - 145 mEq/L 133(L) 138 136  Potassium 3.5 - 5.3 mEq/L 3.9 4.0 3.5  Chloride 96 - 112 mEq/L 96 102 102  CO2 19 - 32 mEq/L '26 24 29  ' Calcium 8.4 - 10.5 mg/dL 8.9 9.1 8.2(L)   Hepatic Function Latest Ref Rng 04/12/2014 04/15/2009 03/01/2009  Total Protein 6.0 - 8.3 g/dL 6.1 6.7 7.2  Albumin 3.5 - 5.2 g/dL 3.8 3.9 4.2  AST 0 - 37 U/L '20 24 16  ' ALT 0 - 35 U/L '13 14 11  ' Alk Phosphatase 39 - 117 U/L 51 63 56  Total Bilirubin 0.2 - 1.2 mg/dL 0.6 0.6 0.6   CBC Latest Ref Rng 01/18/2014 04/22/2009 04/21/2009  WBC 4.0 - 10.5 K/uL 7.5 8.9 11.1(H)  Hemoglobin 12.0 - 15.0 g/dL 14.7 9.4(L) 10.3(L)  Hematocrit 36.0 - 46.0 % 42.7 27.0(L) 29.3(L)  Platelets 150 - 400 K/uL 180 157 170   Lab Results  Component Value Date   MCV 88.8 01/18/2014   MCV 89.6 04/22/2009   MCV 88.8 04/21/2009   Lab Results  Component Value Date   TSH 1.143 02/02/2014   No results found for: HGBA1C   Lipid Panel     Component Value Date/Time   CHOL 201* 03/01/2009 2017   TRIG 189* 03/01/2009 2017   HDL 47 03/01/2009 2017   CHOLHDL 4.3 Ratio 03/01/2009 2017   VLDL 38 03/01/2009 2017   LDLCALC 116* 03/01/2009 2017   RADIOLOGY: Dg Chest 2 View  01/18/2014   CLINICAL DATA:  Generalized weakness.  Body aches.  Hypertension.  EXAM: CHEST  2 VIEW  COMPARISON:  09/19/2011  FINDINGS: Elevated right hemidiaphragm. Lungs are clear. Heart is normal size. No effusions. No acute bony abnormality.  IMPRESSION: No active cardiopulmonary disease.   Electronically Signed   By: Rolm Baptise M.D.   On: 01/18/2014 23:49      ASSESSMENT AND PLAN: Christy Richardson is a 79 year old female who has a history of bronchiectasis, mitral valve prolapse with palpitations, GERD, a history of goiter, hypertension with documented grade 1 diastolic dysfunction and hyperlipidemia.  However, reviewed her recent blood pressure recordings at home.  She has had some blood pressure lability with her blood pressure being elevated in the morning upon  arising and at times dropping fairly low into the low 100s and rarely less than 100.  Several hours later.  I have suggested that she change her losartan from 100 mg daily and take this 50 mg twice a day instead.  She will continue her current dose of metoprolol 50 mg twice a day.  We discussed reducing her meloxicam.  Her palpitations have improved with beta blocker regimen.  She denies recent episodes of chest pressure.  She will be having blood work done next week by her primary physician, I last that he send these to me for my review.  If when he examines her on this twice a day regimen if her blood pressure is still elevated, she may benefit from the addition of low-dose amlodipine to her medical regimen for more optimal blood pressure control.  I will see her in 4-6 months for cardiology follow-up evaluation.  She is on pravastatin statin 40 mg for hyperlipidemia and levothyroxine 50 g for hypothyroidism.  Troy Sine, MD, [facility]  08/16/2015 10:19 AM

## 2015-08-16 NOTE — Patient Instructions (Addendum)
Your physician has recommended you make the following change in your medication: change the losartan to 1/2 tablet twice a day.  Your physician recommends that you schedule a follow-up appointment in: 4 months with Dr. Claiborne Billings.

## 2015-08-29 ENCOUNTER — Telehealth: Payer: Self-pay | Admitting: Cardiovascular Disease

## 2015-08-29 NOTE — Telephone Encounter (Signed)
Pt called in stating that she went to her PCP Christy Richardson to have blood work done on 8/19 and she wanted to know if Dr. Claiborne Billings had received those results yet. Please f/u with her.   Thanks

## 2015-08-29 NOTE — Telephone Encounter (Signed)
Spoke to patient. Informed her we still have not received labwork from Dr. Juel Burrow office - patient informs me she will follow up on this.  She reports BP still fluctuating a bit - was 169/99 yesterday morning, w / HR of 105. She took meds, 1 hr later 120/70 w HR 73 Note that losartan was divided to AM & PM dose at last OV - I advised continue meds at current dosing.  Will route to Dr. Claiborne Billings for any additional considerations at patient request.

## 2015-08-30 ENCOUNTER — Encounter: Payer: Self-pay | Admitting: Cardiovascular Disease

## 2015-08-30 NOTE — Telephone Encounter (Signed)
Labwork received from PCP - scanned into EPIC  Advised pt to keep BP diary BID over next few days - call back Monday w/ BP, HR trends, call sooner if new problems Will return call this week w/ Dr. Evette Georges further recommendations.

## 2015-08-30 NOTE — Telephone Encounter (Signed)
Pt calling in, still upset about BP being up and "yo-yo-ing around". States issue of BP "being elevated really bad" happening in the morning. She had high BP pre morning meds again today.  Wants instruction on what to do. Since she has been having problems w/ BP dropping too low recently, unsure how this would be addressed. She wants Dr. Evette Georges advice.

## 2015-09-04 NOTE — Telephone Encounter (Signed)
IF BP still elevated and consistently >140; add amlodipine 5 mg to regimen

## 2015-09-05 NOTE — Telephone Encounter (Signed)
Has this been taken care of?

## 2015-09-07 NOTE — Telephone Encounter (Signed)
Have her come in on Thursday afternoon for a BP visit.  Have openings still at 3 and 3:30.  Have her continue meds as is, take BP twice daily and bring meds, log of BP readings and BP cuff to appointment

## 2015-09-07 NOTE — Telephone Encounter (Signed)
Called pt back to get update on BPs - she hadn't called yet this week.  She states that despite best knowledge of how to space medications out and time her BP checks, she is still having problems w/ BP ranges. Peaks tend to be early am and later PM - she is routinely hitting 016-010 systolic at these times.  In mid AM to late afternoon, she is consistently obtaining systolic of 932-355. Occasionally lower (cites 89/60 reading the other day). She is symptomatic w/ these. She is concerned that if starting a new med (Dr. Claiborne Billings recommended Amlodipine 5mg  daily if consistent systolic >732) then she will have real problems when BP runs low. I acknowledged her concerns and agreed. Better plan at this time might be to set up for medication management visit and see if we can more optimally time her medication administration or find other options.  Will route to [place] for advice.

## 2015-09-07 NOTE — Telephone Encounter (Signed)
Pt unable to make appt tomorrow d/t doctor's appt. Set up for next week for 30 minute BP management visit. Advised to call in interim if new concerns.  Advised to keep BP log, bring this and cuff and med list as she is currently taking.

## 2015-09-07 NOTE — Telephone Encounter (Signed)
Can this encounter be closed?

## 2015-09-12 ENCOUNTER — Telehealth: Payer: Self-pay | Admitting: Cardiovascular Disease

## 2015-09-12 NOTE — Telephone Encounter (Signed)
F/u questions r/t upcoming appt addressed.

## 2015-09-12 NOTE — Telephone Encounter (Signed)
Pt wants to talk to you,said she had been talking to you. She said you would know what this was about.

## 2015-09-15 ENCOUNTER — Ambulatory Visit (INDEPENDENT_AMBULATORY_CARE_PROVIDER_SITE_OTHER): Payer: PPO | Admitting: Pharmacist

## 2015-09-15 VITALS — BP 152/86 | HR 73

## 2015-09-15 DIAGNOSIS — I10 Essential (primary) hypertension: Secondary | ICD-10-CM

## 2015-09-15 NOTE — Progress Notes (Signed)
Patient ID:                  DOB:                          MRN:     HPI: Christy Richardson is a 80 y.o. female referred by Dr. Claiborne Billings to HTN clinic. She has called to report her BP is elevated in the AM when she wakes up but will then drop lower in the afternoon. She has a PMH significant for hypertension, diabetes, and grade 1 diastolic dysfunction. She was recently told to take half of her losartan in the morning and half in the evening due to dizziness associated with the full 100 mg dose. Today, she reports that this has improved some since making that change in dosing schedule with the losartan.   Current HTN meds:   Losartan 50mg  BID Metoprolol 50mg  BID   Previously tried: irbesartan/HCTZ 150/12.5mg   BP goal: <140/80  Social History: nonsmoker, no regular alcohol  Diet: Pt does like to add salt to her vegetables (uses both canned and fresh) when she is cooking but does not add salt to the food on her plate.  Her only caffeine is with coffee and the most is 2 cups per day.   Exercise: Active around her house and doing her own errands.   Home BP readings:  Waking BP: 258-527 systolic with most being > 782 mmHg systolic  AM after meds: 423-536 systolic  PM after meds: 144R-154M systolic   In office manual reading: 152/86 In office automatic reading: 149/86  Wt Readings from Last 3 Encounters:  08/16/15 146 lb 8 oz (66.452 kg)  05/05/15 146 lb (66.225 kg)  09/15/14 147 lb (66.679 kg)   BP Readings from Last 3 Encounters:  08/16/15 148/88  05/05/15 130/68  09/15/14 116/58   Pulse Readings from Last 3 Encounters:  08/16/15 72  05/05/15 69  09/15/14 72   Labs from PCP: K 4.9, SCr- 0.89 (09/13/15)   Renal function: Estimated creatinine clearance using IBW of 63.9 is 52.5 ml/min.    Past Medical History  Diagnosis Date  . Mitral valve prolapse   . Nonspecific abnormal electrocardiogram (ECG) (EKG)   . Fatigue   . Neck mass   . Recurrent UTI   . Fecal incontinence     . IBS (irritable bowel syndrome)   . Migraine   . Hypothyroidism   . Glaucoma   . Hypertension   . GERD (gastroesophageal reflux disease)   . Diverticulosis of colon (without mention of hemorrhage)   . Depression   . Anxiety   . Allergic rhinitis   . Pulmonary nodules   . DOE (dyspnea on exertion)   . Hyperlipidemia   . Adenomatous polyp of colon 03/2007  . Polyp, sigmoid colon   . Bronchiectasis   . Blood transfusion   . Heart murmur   . Diabetes mellitus without complication   . Tremor   . Palpitations   . Goiter     Current Outpatient Prescriptions on File Prior to Visit  Medication Sig Dispense Refill  . Alpha-D-Galactosidase (BEANO) TABS Take 1 tablet by mouth as needed (for gas).     . ALPRAZolam (XANAX) 1 MG tablet Take 1 tablet (1 mg total) by mouth at bedtime as needed.    Marland Kitchen aspirin 81 MG tablet Take 81 mg by mouth daily.      . cetirizine (ZYRTEC) 10 MG tablet Take  10 mg by mouth daily.     . Fluticasone-Salmeterol (ADVAIR DISKUS) 250-50 MCG/DOSE AEPB Inhale 1 puff into the lungs 2 (two) times daily. 60 each 3  . levothyroxine (SYNTHROID, LEVOTHROID) 50 MCG tablet Take 50 mcg by mouth daily before breakfast.    . LORazepam (ATIVAN) 0.5 MG tablet Take 0.5 mg by mouth 2 (two) times daily as needed (tremors).    . losartan (COZAAR) 100 MG tablet Take 50 mg by mouth 2 (two) times daily.     . meloxicam (MOBIC) 15 MG tablet Take 15 mg by mouth daily.    . metoprolol (LOPRESSOR) 50 MG tablet Take 50 mg by mouth 2 (two) times daily.    . Multiple Vitamins-Minerals (MULTIVITAMIN WITH MINERALS) tablet Take 1 tablet by mouth daily.      . naproxen (NAPROSYN) 250 MG tablet Take 250 mg by mouth daily as needed for moderate pain.    . pravastatin (PRAVACHOL) 40 MG tablet Take 1 tablet (40 mg total) by mouth at bedtime.    Marland Kitchen Respiratory Therapy Supplies (FLUTTER) DEVI Use as directed. 1 each 0  . sertraline (ZOLOFT) 100 MG tablet Take 1 tablet (100 mg total) by mouth at bedtime.  2 at bedtime    . sodium chloride (OCEAN NASAL SPRAY) 0.65 % nasal spray Place 2 sprays into both nostrils at bedtime. Use as needed    . traMADol (ULTRAM) 50 MG tablet Take 50 mg by mouth 2 (two) times daily as needed (pain).     . traZODone (DESYREL) 50 MG tablet Take 50 mg by mouth at bedtime.      . triamcinolone (NASACORT ALLERGY 24HR) 55 MCG/ACT AERO nasal inhaler Place 1 spray into the nose daily.     No current facility-administered medications on file prior to visit.    Allergies  Allergen Reactions  . Penicillins     REACTION: Rash  . Prednisone     REACTION: Increase eye pressure with gloucoma. Broke out in rash after injection per Dr. Percell Miller  . Sulfonamide Derivatives     REACTION: Rash - not sure     Assessment/Plan:  1. Hypertension: Pt's blood pressure today is 152/86 in the office and correlated well with her automatic cuff.  Her BP seem to only be uncontrolled in the AM prior to taking her medications.  I suspect this may be related to a non-dipping pattern.  When she takes her medication, her BP is controlled so do not want to add any additional therapies as I worry about dizziness.  Will have her change her losartan from 50 mg bid to 100 mg at bedtime.  This will hopefully induce a better dipping pattern at night so her BP is better controlled and limit dizziness during the day.   She knows to contact us with any symptomatic hypotension or side effects. We will follow-up as needed.

## 2015-09-15 NOTE — Patient Instructions (Signed)
Change your losartan to 1 tablet at bedtime.   Continue to monitor your blood pressure and let us know how it is doing within the next 2 weeks.

## 2015-09-26 ENCOUNTER — Encounter: Payer: Self-pay | Admitting: Cardiovascular Disease

## 2015-09-29 ENCOUNTER — Telehealth: Payer: Self-pay | Admitting: Pharmacist Clinician (PhC)/ Clinical Pharmacy Specialist

## 2015-09-29 MED ORDER — AMLODIPINE BESYLATE 2.5 MG PO TABS: ORAL_TABLET | ORAL | Status: DC

## 2015-09-29 NOTE — Telephone Encounter (Signed)
Patient calling to report BP readings.  Still having high readings in am.  Between 528-413 systolic.  Usually drops to 140-150s by 10-11 am.  Currently taking metoprolol 50 mg bid and losartan 100 mg qhs.    Considered having her switch from losartan to a stronger ARB, but she reports having used irbesartan about 2 years ago and feeling "washed out" to the point where she won't take it again.  So will have her add amlodipine 2.5 mg nightly and have her call me in a week to report on her BP readings.  Patient voiced understanding.

## 2015-10-14 ENCOUNTER — Telehealth: Payer: Self-pay | Admitting: Pharmacist Clinician (PhC)/ Clinical Pharmacy Specialist

## 2015-10-14 NOTE — Telephone Encounter (Signed)
Pt LMOM that BP still elevated in am.  Returned call; patient currently taking metoprolol 50 mg bid, losartan 100 mg qhs and amlodipine 2.5 mg qhs.  BP readings in am still running 160-180/90s, but normalizing for much of the rest of the day, down as low as 98/56 by noon.  Numbers don't appear to be rising too much until after 9 pm,    Will have her increase amlodipine to 5 mg and take each evening around 5-6 pm.  Hope is that we can get this to help with her lack of night-time dipping.  Pt to call back next week and let us know if this is improving the early morning numbers.  Patient voiced understanding.

## 2015-10-25 ENCOUNTER — Telehealth: Payer: Self-pay | Admitting: Pharmacist Clinician (PhC)/ Clinical Pharmacy Specialist

## 2015-10-25 NOTE — Telephone Encounter (Signed)
Pt called several days ago, still concerned about her BP.  When she gets up is 160-180/90-110, but about 2 hours after her morning metoprolol drops to below [id number] systolic most days.  Then by mid-afternoon back to normal 130-140s.  Current meds include metoprolol 50 mg bid, amlodipine 5mg  with dinner, and losartan 100 mg at bedtime.    I am not sure if the metoprolol is causing such a drastic drop in her morning BP, but I have asked her to decrease her morning dose to 25 mg for the next several days.    She is more worried about the high morning numbers, but I explained that I want to make sure that any meds I give her don't drop the mid-morning readings any lower.  Would like to get that reading up then consider a short acting medication for night time to bring those early numbers down.    Patient is to call back on Monday after Thanksgiving holiday with more information.

## 2015-11-01 ENCOUNTER — Telehealth: Payer: Self-pay | Admitting: Pharmacist Clinician (PhC)/ Clinical Pharmacy Specialist

## 2015-11-01 MED ORDER — HYDRALAZINE HCL 25 MG PO TABS: 25.0000 mg | ORAL_TABLET | Freq: Every day | ORAL | Status: DC

## 2015-11-01 NOTE — Telephone Encounter (Signed)
Spoke with patient.  Home BP still elevated to [id number] systolic in am.  Mid morning numbers have increased to above 123XX123 systolic, then WNL rest of the day.  Patient stopped amlodipine about 1 week ago, as it "didn't make any difference" in the am readings.  Still taking losartan 100 mg qhs and metoprolol 25 mg qam 50 mg qhs.    Will have her try hydralazine 25 mg each night, in hopes of lowering the early morning readings without compromising her low normal and normal daytime readings.  Pt to call back in 1-2 weeks with updated information.

## 2015-11-22 ENCOUNTER — Telehealth: Payer: Self-pay | Admitting: Pharmacist Clinician (PhC)/ Clinical Pharmacy Specialist

## 2015-11-22 NOTE — Telephone Encounter (Signed)
Pt LMOM regarding elevated BP readings  Returned call, patient states home morning BP readings are still elevated, running mostly 160-168, with a few readings between 170-180.  This is somewhat an improvement for her, but she still is very concerned.  Her daytime readings all continue to be WNL.   She currently takes hydralazine 25 mg qhs and losartan 100 mg qhs.    Assured her that the readings are better than some of her past reports.  She is also concerned about flushing on her face that occurs regularly.  Apparently it happens only on one side.  She is considering seeing a neurologist about this.    I will review this information with Dr. Claiborne Billings and contact her later this week.  I could switch out the losartan for a more potent ARB or increase the hydralazine, but patient seems hesitant to make changes.  I will try to bring her into the office after the first of the year.

## 2015-11-25 NOTE — Telephone Encounter (Signed)
Reviewed information with Dr. Claiborne Billings.  For now we will increase hydralazine to 50 mg each night.  Also will consider sleep apnea as cause of non-dipping.  LMOM about increasing hydralazine, will call her next week regarding possible sleep apnea.

## 2015-11-30 ENCOUNTER — Other Ambulatory Visit: Payer: Self-pay | Admitting: Pharmacist Clinician (PhC)/ Clinical Pharmacy Specialist

## 2015-11-30 MED ORDER — HYDRALAZINE HCL 50 MG PO TABS: 50.0000 mg | ORAL_TABLET | Freq: Every day | ORAL | Status: DC

## 2015-11-30 NOTE — Telephone Encounter (Signed)
Spoke with patient about possibly doing sleep study to rule out sleep apnea.  Patient not interested at this time, although she will think on it.  States she feels like a mask would make her feel as though she were being smothered  Has done some better with am blood pressure readings since increasing hydralazine to 50 mg qhs.  Last couple of days had BP in 150-160's in mornings.  Pt to continue with 50 mg qhs, will send rx to her pharmacy.  She is to call if she wants to talk further about doing a sleep study.

## 2016-01-24 DIAGNOSIS — R7301 Impaired fasting glucose: Secondary | ICD-10-CM | POA: Diagnosis not present

## 2016-01-24 DIAGNOSIS — E039 Hypothyroidism, unspecified: Secondary | ICD-10-CM | POA: Diagnosis not present

## 2016-01-24 DIAGNOSIS — I10 Essential (primary) hypertension: Secondary | ICD-10-CM | POA: Diagnosis not present

## 2016-01-24 DIAGNOSIS — E782 Mixed hyperlipidemia: Secondary | ICD-10-CM | POA: Diagnosis not present

## 2016-01-26 ENCOUNTER — Ambulatory Visit (INDEPENDENT_AMBULATORY_CARE_PROVIDER_SITE_OTHER): Payer: PPO | Admitting: Cardiovascular Disease

## 2016-01-26 ENCOUNTER — Encounter: Payer: Self-pay | Admitting: Cardiovascular Disease

## 2016-01-26 VITALS — BP 120/80 | HR 70 | Ht 68.0 in | Wt 148.2 lb

## 2016-01-26 DIAGNOSIS — E039 Hypothyroidism, unspecified: Secondary | ICD-10-CM

## 2016-01-26 DIAGNOSIS — E785 Hyperlipidemia, unspecified: Secondary | ICD-10-CM | POA: Diagnosis not present

## 2016-01-26 DIAGNOSIS — I10 Essential (primary) hypertension: Secondary | ICD-10-CM | POA: Diagnosis not present

## 2016-01-26 DIAGNOSIS — J479 Bronchiectasis, uncomplicated: Secondary | ICD-10-CM

## 2016-01-26 DIAGNOSIS — I519 Heart disease, unspecified: Secondary | ICD-10-CM

## 2016-01-26 DIAGNOSIS — I059 Rheumatic mitral valve disease, unspecified: Secondary | ICD-10-CM

## 2016-01-26 DIAGNOSIS — I5189 Other ill-defined heart diseases: Secondary | ICD-10-CM

## 2016-01-26 MED ORDER — METOPROLOL SUCCINATE ER 50 MG PO TB24: 50.0000 mg | ORAL_TABLET | Freq: Every day | ORAL | Status: DC

## 2016-01-26 MED ORDER — HYDRALAZINE HCL 50 MG PO TABS: 50.0000 mg | ORAL_TABLET | Freq: Two times a day (BID) | ORAL | Status: DC

## 2016-01-26 NOTE — Patient Instructions (Addendum)
Your physician has recommended you make the following change in your medication: the lopressor has been changed to metoprolol succ ER. The hydralazine has been changed to twice a day 12 hours apart.  Your physician recommends that you schedule a follow-up appointment in: 2 months with Dr Claiborne Billings.  Please have Dr [place] Crane fax [place] a copy of your lab results to [phone].

## 2016-01-26 NOTE — Progress Notes (Signed)
Patient ID: Christy Richardson, female   DOB: [birth year], 81 y.o.   MRN: [id number]    Primary M.D.: Dr. Delphina Cahill  HPI: Christy Richardson is a 81 y.o. female who presents to the office today for an 6 month cardiology evaluation.  Christy Richardson has a history of mitral valve prolapse with palpitations, bronchiectasis, GERD, goirter, mixed hyperlipidemia, as well as hypertension with documented grade 1 diastolic dysfunction. Dr. Lake Bells follows her bronchiectasis. In October 2012 a nuclear perfusion study revealed normal perfusion and an echo Doppler study revealed normal systolic function with grade 1 diastolic dysfunction, mild aortic valve sclerosis without stenosis, trace AR, MR, and mild TR as well as PR. She had upper normal RV pressure 26 mm.d  She has a 30 year history of hypertension , hyperlipidemia, and GERD. She underwent an echo Doppler study on 02/02/2014.  This showed an ejection fraction of 60-65%.  She had grade 1 diastolic dysfunction.  There was mild aortic sclerosis without stenosis.  A lexiscan perfusion study on 02/04/2014 was normal without scar or ischemia.  Post stress ejection fraction was 73%.  She did wear a CardioNet monitor to assess for palpitations.  Unfortunately, she developed significant skin irritation, and only wore this for 9 days.  This revealed episodes of sinus rhythm, but she did have episodes of sinus tachycardia with rates in the low 100s.  There was there were no episodes of SVT or atrial fibrillation.  When I last saw her, she was having some difficulty with blood pressure lability.  Its were made and subsequent leashes also seen.  The pharmacist for further adjustment.  Presently, she has been taking losartan 100 mg at bedtime, metoprolol 25 mg in the morning and 50 mg at bedtime, and had been started on hydralazine 50 mg at bedtime.  She states oftentimes in the morning.  Her blood pressure is 150-180.  Her metoprolol dose had been reduced since she was having episodes  of dizziness in the afternoon.  Dates that she just had blood work done by her primary physician.  The patient denies sleep apnea.  She wakes up 2-3 times per night.  She does snore mildly.  She said she will never use CPAP therapy.  She denies chest pain.  She denies PND, orthopnea.  She denies frank syncope.  She is unaware of recent palpitations.  She presents for follow-up evaluation.    Past Medical History  Diagnosis Date  . Mitral valve prolapse   . Nonspecific abnormal electrocardiogram (ECG) (EKG)   . Fatigue   . Neck mass   . Recurrent UTI   . Fecal incontinence   . IBS (irritable bowel syndrome)   . Migraine   . Hypothyroidism   . Glaucoma   . Hypertension   . GERD (gastroesophageal reflux disease)   . Diverticulosis of colon (without mention of hemorrhage)   . Depression   . Anxiety   . Allergic rhinitis   . Pulmonary nodules   . DOE (dyspnea on exertion)   . Hyperlipidemia   . Adenomatous polyp of colon 03/2007  . Polyp, sigmoid colon   . Bronchiectasis ([facility])   . Blood transfusion   . Heart murmur   . Diabetes mellitus without complication ([place])   . Tremor   . Palpitations   . Goiter     Past Surgical History  Procedure Laterality Date  . Abdominal hysterectomy    . Oophorectomy    . Mastoidectomy      rt ear  .  Total hip arthroplasty  5/10    left  . Colonoscopy    . Esophagogastroduodenoscopy    . Cardiovascular stress test  09/19/2011    No scintigraphic evidence of inducible myocardial ischemia. Pharmacological stress test without chest pain or EKG changes for ischemia.  . Transthoracic echocardiogram  09/19/2011    EF >76%, stage 1 diastolic dysfunction, mild tricuspid valve regurg    Allergies  Allergen Reactions  . Penicillins     REACTION: Rash  . Prednisone     REACTION: Increase eye pressure with gloucoma. Broke out in rash after injection per Dr. Percell Miller  . Sulfonamide Derivatives     REACTION: Rash - not sure    Current Outpatient  Prescriptions  Medication Sig Dispense Refill  . Alpha-D-Galactosidase (BEANO) TABS Take 1 tablet by mouth as needed (for gas).     . ALPRAZolam (XANAX) 1 MG tablet Take 1 tablet (1 mg total) by mouth at bedtime as needed.    Marland Kitchen aspirin 81 MG tablet Take 81 mg by mouth daily.      . brimonidine (ALPHAGAN) 0.2 % ophthalmic solution Place 1 drop into both eyes 2 (two) times daily. Place 1 drop into both eyes 2 (two) times daily.    . cetirizine (ZYRTEC) 10 MG tablet Take 10 mg by mouth daily.     . dorzolamide (TRUSOPT) 2 % ophthalmic solution Place 1 drop into both eyes 2 (two) times daily. Place 1 drop into both eyes 2 (two) times daily.    . Fluticasone-Salmeterol (ADVAIR DISKUS) 250-50 MCG/DOSE AEPB Inhale 1 puff into the lungs 2 (two) times daily. 60 each 3  . hydrALAZINE (APRESOLINE) 50 MG tablet Take 1 tablet (50 mg total) by mouth at bedtime. 90 tablet 2  . levothyroxine (SYNTHROID, LEVOTHROID) 50 MCG tablet Take 50 mcg by mouth daily before breakfast.    . LORazepam (ATIVAN) 0.5 MG tablet Take 0.5 mg by mouth 2 (two) times daily as needed (tremors).    . losartan (COZAAR) 100 MG tablet Take 100 mg by mouth at bedtime.     . meloxicam (MOBIC) 15 MG tablet Take 15 mg by mouth daily.    . metoprolol (LOPRESSOR) 50 MG tablet Take 50 mg by mouth 2 (two) times daily.     . Multiple Vitamins-Minerals (MULTIVITAMIN WITH MINERALS) tablet Take 1 tablet by mouth daily.      Marland Kitchen omeprazole (PRILOSEC) 20 MG capsule Take 1 capsule by mouth daily. Take 1 tab by mouth daily    . pravastatin (PRAVACHOL) 40 MG tablet Take 1 tablet (40 mg total) by mouth at bedtime.    Marland Kitchen Respiratory Therapy Supplies (FLUTTER) DEVI Use as directed. 1 each 0  . sertraline (ZOLOFT) 100 MG tablet Take 1 tablet (100 mg total) by mouth at bedtime. 2 at bedtime    . sodium chloride (OCEAN NASAL SPRAY) 0.65 % nasal spray Place 2 sprays into both nostrils at bedtime. Use as needed    . traMADol (ULTRAM) 50 MG tablet Take 50 mg by mouth  2 (two) times daily as needed (pain).     . traZODone (DESYREL) 50 MG tablet Take 50 mg by mouth at bedtime.      . triamcinolone (NASACORT ALLERGY 24HR) 55 MCG/ACT AERO nasal inhaler Place 1 spray into the nose daily.     No current facility-administered medications for this visit.    Social History   Social History  . Marital Status: Widowed    Spouse Name: N/A  . Number  of Children: 2  . Years of Education: 10th grade   Occupational History  . Retired     Scientist, research (medical)   Social History Main Topics  . Smoking status: Never Smoker   . Smokeless tobacco: Never Used  . Alcohol Use: No  . Drug Use: No  . Sexual Activity: Not on file   Other Topics Concern  . Not on file   Social History Narrative   Widowed since 2002. Lives with her Daughter.   Socially she is widowed and has 2 children. No tobacco use. She does not routinely exercise.  Family History  Problem Relation Age of Onset  . Diabetes Father   . Throat cancer Mother   . Coronary artery disease Mother   . Prostate cancer Brother   . Asthma Daughter   . Scoliosis Daughter   . Esophageal cancer Neg Hx   . Rectal cancer Neg Hx   . Stomach cancer Neg Hx   . Colon cancer Paternal Grandfather    ROS General: Negative; No fevers, chills, or night sweats;  HEENT: Negative; No changes in vision or hearing, sinus congestion, difficulty swallowing Pulmonary: Positive for bronchiectasis; No cough, wheezing, shortness of breath, hemoptysis Cardiovascular: Negative; No chest pain, presyncope, syncope, palpitations GI: Positive for GERD; No nausea, vomiting, diarrhea, or abdominal pain GU: Negative; No dysuria, hematuria, or difficulty voiding Musculoskeletal: Positive for occasional arthritic symptoms in her neck. Hematologic/Oncology: Negative; no easy bruising, bleeding Endocrine: Positive for hypothyroidism on Synthroid replacement Neuro: Negative; no changes in balance, headaches Skin: Negative; No rashes or skin  lesions Psychiatric: Negative; No behavioral problems, depression Sleep: She does not sleep well.; No snoring, daytime sleepiness, hypersomnolence, bruxism, restless legs, hypnogognic hallucinations, no cataplexy Other comprehensive 14 point system review is negative.   PE BP 120/80 mmHg  Pulse 70  Ht _0  (1.727 m)  Wt 148 lb 3 oz (67.217 kg)  BMI 22.54 kg/m2  Repeat blood pressure by me was 148/80 supine and 132/80 standing  Wt Readings from Last 3 Encounters:  01/26/16 148 lb 3 oz (67.217 kg)  08/16/15 146 lb 8 oz (66.452 kg)  05/05/15 146 lb (66.225 kg)   General: Alert, oriented, no distress.  Skin: normal turgor, no rashes HEENT: Normocephalic, atraumatic. Pupils round and reactive; sclera anicteric;no lid lag. Extraocular muscles intact;; no xanthelasmas. Nose without nasal septal hypertrophy Mouth/Parynx benign; Mallinpatti scale 2-3 Neck: No JVD, no carotid bruits; normal carotid upstroke Lungs: clear to ausculatation and percussion; no wheezing or rales Chest wall: no tenderness to palpitation Heart: RRR, s1 s2 normal; 1/6 systolic murmur along the LSB. no diastolic murmur, rub thrills or heaves Abdomen: soft, nontender; no hepatosplenomehaly, BS+; abdominal aorta nontender and not dilated by palpation. Back: no CVA tenderness Pulses 2+ Extremities: no clubbing cyanosis or edema, Homan's sign negative  Neurologic: grossly nonfocal; cranial nerves grossly normal. Psychologic: normal affect and mood.  ECG (independently read by me): Normal sinus rhythm at 70 bpm.  QS complex V1 V2.  Nonspecific ST-T changes.  Normal intervals.  September 2016 ECG (independently read by me): Normal sinus rhythm at 72 bpm.  Nonspecific ST changes.  Q wave in lead aVL  Prior ECG (independently read by me): Sinus rhythm at 72 beats per minute. Small Q-wave in lead aVL. No significant ST changes.  LABS: BMP Latest Ref Rng 04/12/2014 01/18/2014 04/22/2009  Glucose 70 - 99 mg/dL 106(H)  128(H) 125(H)  BUN 6 - 23 mg/dL 19 16 3(L)  Creatinine 0.50 - 1.10 mg/dL 1.01  0.85 0.83  Sodium 135 - 145 mEq/L 133(L) 138 136  Potassium 3.5 - 5.3 mEq/L 3.9 4.0 3.5  Chloride 96 - 112 mEq/L 96 102 102  CO2 19 - 32 mEq/L _0 Calcium 8.4 - 10.5 mg/dL 8.9 9.1 8.2(L)   Hepatic Function Latest Ref Rng 04/12/2014 04/15/2009 03/01/2009  Total Protein 6.0 - 8.3 g/dL 6.1 6.7 7.2  Albumin 3.5 - 5.2 g/dL 3.8 3.9 4.2  AST 0 - 37 U/L _1 ALT 0 - 35 U/L _2 Alk Phosphatase 39 - 117 U/L 51 63 56  Total Bilirubin 0.2 - 1.2 mg/dL 0.6 0.6 0.6   CBC Latest Ref Rng 01/18/2014 04/22/2009 04/21/2009  WBC 4.0 - 10.5 K/uL 7.5 8.9 11.1(H)  Hemoglobin 12.0 - 15.0 g/dL 14.7 9.4(L) 10.3(L)  Hematocrit 36.0 - 46.0 % 42.7 27.0(L) 29.3(L)  Platelets 150 - 400 K/uL 180 157 170   Lab Results  Component Value Date   MCV 88.8 01/18/2014   MCV 89.6 04/22/2009   MCV 88.8 04/21/2009   Lab Results  Component Value Date   TSH 1.143 02/02/2014   No results found for: HGBA1C   Lipid Panel     Component Value Date/Time   CHOL 201* 03/01/2009 2017   TRIG 189* 03/01/2009 2017   HDL 47 03/01/2009 2017   CHOLHDL 4.3 Ratio 03/01/2009 2017   VLDL 38 03/01/2009 2017   LDLCALC 116* 03/01/2009 2017   RADIOLOGY: Dg Chest 2 View  01/18/2014   CLINICAL DATA:  Generalized weakness.  Body aches.  Hypertension.  EXAM: CHEST  2 VIEW  COMPARISON:  09/19/2011  FINDINGS: Elevated right hemidiaphragm. Lungs are clear. Heart is normal size. No effusions. No acute bony abnormality.  IMPRESSION: No active cardiopulmonary disease.   Electronically Signed   By: Rolm Baptise M.D.   On: 01/18/2014 23:49      ASSESSMENT AND PLAN: Christy Richardson is a 81 year old female who has a history of bronchiectasis, mitral valve prolapse with palpitations, GERD, a history of goiter, hypertension with documented grade 1 diastolic dysfunction and hyperlipidemia.  She has had some blood pressure lability with her blood pressure being  elevated in the morning upon arising and at times dropping fairly low into the low 100s and rarely less than 100.  Since I had seen her last, her medications have been adjusted, but she continues to have increased morning blood pressure which usually are in the 150 to less than 180 range.  I reviewed her home blood pressure recordings in detail.  At this time, I am suggesting that we change her metoprolol tartrate to metoprolol succinate and she will take this 50 mg at bedtime.  Her resting pulse is 70.  This should provide less of a peak and trough like phenomenon.  I am also recommending that she change her hydralazine from 50 mg at bedtime to 50 mg every 12 hours.  Will continue her present dose of losartan at 100 mg at bedtime, but in the future it may be worthwhile to consider changing this to 50 mg twice a day.  She just had blood work completed.  I will try to obtain these results.  She continues to be on Pravachol 40 mg for hyperlipidemia.  Her sleep is poor, but she is adamant that she will never consider CPAP therapy if she was ever diagnosed as having sleep apnea.  There is no edema.  Her lungs are clear without wheezing with regards to her  bronchiectasis.  She will continue to monitor her blood pressure recordings.  I will see her in 2 months for reevaluation.  Time spent: 25 minutes Troy Sine, MD, [facility]  01/26/2016 11:35 AM

## 2016-02-08 DIAGNOSIS — I10 Essential (primary) hypertension: Secondary | ICD-10-CM | POA: Diagnosis not present

## 2016-02-08 DIAGNOSIS — H409 Unspecified glaucoma: Secondary | ICD-10-CM | POA: Diagnosis not present

## 2016-02-08 DIAGNOSIS — J00 Acute nasopharyngitis [common cold]: Secondary | ICD-10-CM | POA: Diagnosis not present

## 2016-02-08 DIAGNOSIS — E782 Mixed hyperlipidemia: Secondary | ICD-10-CM | POA: Diagnosis not present

## 2016-02-08 DIAGNOSIS — E039 Hypothyroidism, unspecified: Secondary | ICD-10-CM | POA: Diagnosis not present

## 2016-02-08 DIAGNOSIS — J479 Bronchiectasis, uncomplicated: Secondary | ICD-10-CM | POA: Diagnosis not present

## 2016-02-08 DIAGNOSIS — K219 Gastro-esophageal reflux disease without esophagitis: Secondary | ICD-10-CM | POA: Diagnosis not present

## 2016-02-08 DIAGNOSIS — R7301 Impaired fasting glucose: Secondary | ICD-10-CM | POA: Diagnosis not present

## 2016-02-23 DIAGNOSIS — M47812 Spondylosis without myelopathy or radiculopathy, cervical region: Secondary | ICD-10-CM | POA: Diagnosis not present

## 2016-02-23 DIAGNOSIS — H7011 Chronic mastoiditis, right ear: Secondary | ICD-10-CM | POA: Diagnosis not present

## 2016-02-23 DIAGNOSIS — K219 Gastro-esophageal reflux disease without esophagitis: Secondary | ICD-10-CM | POA: Diagnosis not present

## 2016-03-08 ENCOUNTER — Telehealth: Payer: Self-pay | Admitting: Cardiovascular Disease

## 2016-03-08 NOTE — Telephone Encounter (Signed)
Returned call to patient.Spoke to [facility] he advised decrease hydralazine to 25 mg twice a day if B/P elevated may take 25 mg three times a day.Advised to continue to monitor B/P and call back next week to report how you are feeling and B/P readings.

## 2016-03-08 NOTE — Telephone Encounter (Signed)
New Message  Pt c/o BP issue:  1. What are your last 5 BP readings?   4/6 @ 7:40a 143/90 hr 97 4/6 @ 10:10a 113/55 pulse 90 4/6 @ 12:30p 91/57 pulse 78    2. Are you having any other symptoms (ex. Dizziness, headache, blurred vision, passed out)? Weak dizzy and headaches

## 2016-03-08 NOTE — Telephone Encounter (Signed)
Returned call to patient.She stated she is having problems with her B/P.When she gets up in mornings B/P high,after she takes medications a couple of hours later B/P drops.Stated this morning she held hydralazine,but B/P at present 91/57 pulse 78.Stated she just don't feel good,dizzy, no appetite.She has appointment scheduled with [facility] 03/29/16.Advised I will speak to DOD and call her back.

## 2016-03-28 ENCOUNTER — Ambulatory Visit: Payer: Self-pay | Admitting: Cardiovascular Disease

## 2016-03-29 ENCOUNTER — Ambulatory Visit (INDEPENDENT_AMBULATORY_CARE_PROVIDER_SITE_OTHER): Payer: PPO | Admitting: Cardiovascular Disease

## 2016-03-29 ENCOUNTER — Encounter: Payer: Self-pay | Admitting: Cardiovascular Disease

## 2016-03-29 VITALS — BP 132/74 | HR 76 | Ht 68.0 in | Wt 147.0 lb

## 2016-03-29 DIAGNOSIS — I10 Essential (primary) hypertension: Secondary | ICD-10-CM | POA: Diagnosis not present

## 2016-03-29 DIAGNOSIS — J479 Bronchiectasis, uncomplicated: Secondary | ICD-10-CM | POA: Diagnosis not present

## 2016-03-29 DIAGNOSIS — E785 Hyperlipidemia, unspecified: Secondary | ICD-10-CM | POA: Diagnosis not present

## 2016-03-29 DIAGNOSIS — E039 Hypothyroidism, unspecified: Secondary | ICD-10-CM | POA: Diagnosis not present

## 2016-03-29 MED ORDER — AMLODIPINE BESYLATE 5 MG PO TABS: 5.0000 mg | ORAL_TABLET | Freq: Every day | ORAL | Status: DC

## 2016-03-29 MED ORDER — METOPROLOL SUCCINATE ER 50 MG PO TB24: 50.0000 mg | ORAL_TABLET | Freq: Every day | ORAL | Status: DC

## 2016-03-29 NOTE — Patient Instructions (Signed)
Your physician has recommended you make the following change in your medication:   1.)  STOP the hydralazine.  2.) the metoprolol has been changed to nighttime.  3.) the losartan has ben changed to 1/2 tablet twice a day.  4.) start new prescription for amlodipine. This has been sent to your pharmacy.  Your physician recommends that you schedule a follow-up appointment in: 2-3 months

## 2016-03-30 ENCOUNTER — Encounter: Payer: Self-pay | Admitting: Pulmonary Disease

## 2016-03-30 ENCOUNTER — Ambulatory Visit (INDEPENDENT_AMBULATORY_CARE_PROVIDER_SITE_OTHER): Payer: PPO | Admitting: Pulmonary Disease

## 2016-03-30 VITALS — BP 126/72 | HR 83 | Ht 67.5 in | Wt 147.0 lb

## 2016-03-30 DIAGNOSIS — J479 Bronchiectasis, uncomplicated: Secondary | ICD-10-CM | POA: Diagnosis not present

## 2016-03-30 NOTE — Patient Instructions (Signed)
Take Advair twice a day, not once daily Use your flutter valve 4-5 breaths, 4-5 times a day We will make a referral to pulmonary rehabilitation We'll see you back in 6 months or sooner if needed

## 2016-03-30 NOTE — Progress Notes (Signed)
Subjective:    Patient ID: Christy Richardson, female    DOB: 1935-11-04, 81 y.o.   MRN: [id number]  Synopsis: This is a very pleasant female with bronchiectasis presumably from childhood pertussis.  She switched care from [facility] to [facility] (P[facility]) in 05/2012.  Her simple spirometry in 2013 was 78% predicted.  HPI Chief Complaint  Patient presents with  . Follow-up    pt c/o sob with exertion worsening over time, some chest tightness and wheezing with exertion, pnd, sometimes prod cough with clear/white mucus.    Christy Richardson has been struggling with her blood pressure recently and these medications were changed by her cardiologist. She has been having some dizziness lately prior to these medications.   Her breathing is "bad".  She says that she is short of breath with minimal activity.  She just uses her Advair daily but the cost is too high for her so she only takes it once per day.  She is trying to save money with this regimen.   She is not coughing up much mucus.  She had the flu in the end of February and had a lot of symptoms.  She took the doxycycline we gave her the last time and this helped.  She does not feel chest congestion.  She doesn't cough up mucus.  She is not using the flutter valve.    She is not exercising.  She just walks to her mailbox, but that is all really.      Past Medical History  Diagnosis Date  . Mitral valve prolapse   . Nonspecific abnormal electrocardiogram (ECG) (EKG)   . Fatigue   . Neck mass   . Recurrent UTI   . Fecal incontinence   . IBS (irritable bowel syndrome)   . Migraine   . Hypothyroidism   . Glaucoma   . Hypertension   . GERD (gastroesophageal reflux disease)   . Diverticulosis of colon (without mention of hemorrhage)   . Depression   . Anxiety   . Allergic rhinitis   . Pulmonary nodules   . DOE (dyspnea on exertion)   . Hyperlipidemia   . Adenomatous polyp of colon 03/2007  . Polyp, sigmoid colon   . Bronchiectasis  ([place])   . Blood transfusion   . Heart murmur   . Diabetes mellitus without complication ([place])   . Tremor   . Palpitations   . Goiter      Review of Systems  Constitutional: Negative for fever, chills and fatigue.  HENT: Negative for postnasal drip, rhinorrhea and sinus pressure.   Respiratory: Positive for shortness of breath. Negative for cough and wheezing.   Cardiovascular: Negative for chest pain, palpitations and leg swelling.  4    Objective:   Physical Exam  Filed Vitals:   03/30/16 1349  BP: 126/72  Pulse: 83  Height: 5' 7.5" (1.715 m)  Weight: 147 lb (66.679 kg)  SpO2: 95%   RA   Gen: well appearing, no acute distress HEENT: NCAT, EOMi, oropharynx clear without thrush PULM: Few crackles right base, otherwise clear normal effort CV: RRR, no mgr, no JVD AB: BS+, soft, nontender,  Ext: warm, no edema, no clubbing, no cyanosis  05/2012 simple spirometry> > Ratio 63%, FEV1 1.98L (78% pred) 11/2012 CT chest [facility] >> bilateral bronchiectasis, scattered nodules no greater than 35mm in size March 2015 simple spirometry > ratio 62%, FEV1 1.87 L (79% predicted) February 2015 chest x-ray images reviewed: Elevated right hemidiaphragm, some chronic bronchitic changes,  otherwise clear 05/05/2015 simple spirometry> ratio 60%, FEV1 1.79 L (77% predicted).    Assessment & Plan:   Bronchiectasis without acute exacerbation ([place]) She has been experiencing more dyspnea late which I think is mostly due to deconditioning. Certainly her bronchiectasis contribute switch she only has moderate airflow obstruction which has not significantly worsened in the last several years.  The degree of which the bronchiectasis contributes would likely lessen if she would actually take her medicine as prescribed. However, she's persistently noncompliant with medications and mucociliary clearance.  Plan: Pulmonary rehabilitation referral Take Advair twice a day, not daily Use mucociliary clearance  methods  Follow-up 6 months or sooner if needed    Updated Medication List Outpatient Encounter Prescriptions as of 03/30/2016  Medication Sig  . Alpha-D-Galactosidase (BEANO) TABS Take 1 tablet by mouth as needed (for gas).   . ALPRAZolam (XANAX) 1 MG tablet Take 1 tablet (1 mg total) by mouth at bedtime as needed.  Marland Kitchen amLODipine (NORVASC) 5 MG tablet Take 1 tablet (5 mg total) by mouth daily after breakfast.  . aspirin 81 MG tablet Take 81 mg by mouth daily.    . brimonidine (ALPHAGAN) 0.2 % ophthalmic solution Place 1 drop into both eyes 2 (two) times daily. Place 1 drop into both eyes 2 (two) times daily.  . cetirizine (ZYRTEC) 10 MG tablet Take 10 mg by mouth daily.   . dorzolamide (TRUSOPT) 2 % ophthalmic solution Place 1 drop into both eyes 2 (two) times daily. Place 1 drop into both eyes 2 (two) times daily.  . Fluticasone-Salmeterol (ADVAIR DISKUS) 250-50 MCG/DOSE AEPB Inhale 1 puff into the lungs 2 (two) times daily.  Marland Kitchen levothyroxine (SYNTHROID, LEVOTHROID) 50 MCG tablet Take 50 mcg by mouth daily before breakfast.  . LORazepam (ATIVAN) 0.5 MG tablet Take 0.5 mg by mouth 2 (two) times daily as needed (tremors).  . losartan (COZAAR) 100 MG tablet Take 50 mg by mouth 2 (two) times daily.  . meloxicam (MOBIC) 15 MG tablet Take 15 mg by mouth daily.  . metoprolol succinate (TOPROL-XL) 50 MG 24 hr tablet Take 1 tablet (50 mg total) by mouth at bedtime. Take with or immediately following a meal.  . Multiple Vitamins-Minerals (MULTIVITAMIN WITH MINERALS) tablet Take 1 tablet by mouth daily.    Marland Kitchen omeprazole (PRILOSEC) 20 MG capsule Take 1 capsule by mouth daily. Take 1 tab by mouth daily  . pravastatin (PRAVACHOL) 40 MG tablet Take 1 tablet (40 mg total) by mouth at bedtime.  Marland Kitchen Respiratory Therapy Supplies (FLUTTER) DEVI Use as directed.  . sertraline (ZOLOFT) 100 MG tablet Take 1 tablet (100 mg total) by mouth at bedtime. 2 at bedtime  . sodium chloride (OCEAN NASAL SPRAY) 0.65 % nasal  spray Place 2 sprays into both nostrils at bedtime. Use as needed  . traMADol (ULTRAM) 50 MG tablet Take 50 mg by mouth 2 (two) times daily as needed (pain).   . traZODone (DESYREL) 50 MG tablet Take 50 mg by mouth at bedtime.    . triamcinolone (NASACORT ALLERGY 24HR) 55 MCG/ACT AERO nasal inhaler Place 1 spray into the nose daily.   No facility-administered encounter medications on file as of 03/30/2016.

## 2016-03-30 NOTE — Assessment & Plan Note (Signed)
She has been experiencing more dyspnea late which I think is mostly due to deconditioning. Certainly her bronchiectasis contribute switch she only has moderate airflow obstruction which has not significantly worsened in the last several years.  The degree of which the bronchiectasis contributes would likely lessen if she would actually take her medicine as prescribed. However, she's persistently noncompliant with medications and mucociliary clearance.  Plan: Pulmonary rehabilitation referral Take Advair twice a day, not daily Use mucociliary clearance methods  Follow-up 6 months or sooner if needed

## 2016-03-30 NOTE — Addendum Note (Signed)
Addended by: Len Blalock on: 03/30/2016 02:24 PM   Modules accepted: Orders

## 2016-03-31 ENCOUNTER — Encounter: Payer: Self-pay | Admitting: Cardiovascular Disease

## 2016-03-31 NOTE — Progress Notes (Signed)
Patient ID: Christy Richardson, female   DOB: [birth year], 81 y.o.   MRN: [id number]    Primary M.D.: Dr. Delphina Cahill  HPI: Christy Richardson is a 81 y.o. female who presents to the office today for a 2 month cardiology evaluation.  Christy Richardson has a history of mitral valve prolapse with palpitations, bronchiectasis, GERD, goirter, mixed hyperlipidemia, as well as hypertension with documented grade 1 diastolic dysfunction. Dr. Lake Bells follows her bronchiectasis. In October 2012 a nuclear perfusion study revealed normal perfusion and an echo Doppler study revealed normal systolic function with grade 1 diastolic dysfunction, mild aortic valve sclerosis without stenosis, trace AR, MR, and mild TR as well as PR. She had upper normal RV pressure 26 mm.d  She has a 30 year history of hypertension , hyperlipidemia, and GERD. She underwent an echo Doppler study on 02/02/2014.  This showed an ejection fraction of 60-65%.  She had grade 1 diastolic dysfunction.  There was mild aortic sclerosis without stenosis.  A lexiscan perfusion study on 02/04/2014 was normal without scar or ischemia.  Post stress ejection fraction was 73%.  She did wear a CardioNet monitor to assess for palpitations.  Unfortunately, she developed significant skin irritation, and only wore this for 9 days.  This revealed episodes of sinus rhythm, but she did have episodes of sinus tachycardia with rates in the low 100s.  There was there were no episodes of SVT or atrial fibrillation.  She has had difficulty with blood pressure.  When I last saw her, I changed her metoprolol, tartrate 2 metoprolol succinate and recommended that she take this 50 mg at bedtime.  She has continued to be on losartan at 100 mg and has been taking hydralazine 12.5 mg in the morning and 25 mg in the evening.  She states her blood pressure continues to be high when she wakes up, but it gets low at times later in the day.  He continues to be on pravastatin for hyperlipidemia.  She  denies chest pain.  She denies PND, orthopnea.  She denies frank syncope.  She is unaware of recent palpitations.  I reviewed recent blood work from Dr. Melodie Bouillon office.  She presents for follow-up evaluation.    Past Medical History  Diagnosis Date  . Mitral valve prolapse   . Nonspecific abnormal electrocardiogram (ECG) (EKG)   . Fatigue   . Neck mass   . Recurrent UTI   . Fecal incontinence   . IBS (irritable bowel syndrome)   . Migraine   . Hypothyroidism   . Glaucoma   . Hypertension   . GERD (gastroesophageal reflux disease)   . Diverticulosis of colon (without mention of hemorrhage)   . Depression   . Anxiety   . Allergic rhinitis   . Pulmonary nodules   . DOE (dyspnea on exertion)   . Hyperlipidemia   . Adenomatous polyp of colon 03/2007  . Polyp, sigmoid colon   . Bronchiectasis ([place])   . Blood transfusion   . Heart murmur   . Diabetes mellitus without complication ([place])   . Tremor   . Palpitations   . Goiter     Past Surgical History  Procedure Laterality Date  . Abdominal hysterectomy    . Oophorectomy    . Mastoidectomy      rt ear  . Total hip arthroplasty  5/10    left  . Colonoscopy    . Esophagogastroduodenoscopy    . Cardiovascular stress test  09/19/2011  No scintigraphic evidence of inducible myocardial ischemia. Pharmacological stress test without chest pain or EKG changes for ischemia.  . Transthoracic echocardiogram  09/19/2011    EF >10%, stage 1 diastolic dysfunction, mild tricuspid valve regurg    Allergies  Allergen Reactions  . Penicillins     REACTION: Rash  . Prednisone     REACTION: Increase eye pressure with gloucoma. Broke out in rash after injection per Dr. Percell Miller  . Sulfonamide Derivatives     REACTION: Rash - not sure    Current Outpatient Prescriptions  Medication Sig Dispense Refill  . Alpha-D-Galactosidase (BEANO) TABS Take 1 tablet by mouth as needed (for gas).     . ALPRAZolam (XANAX) 1 MG tablet Take 1 tablet  (1 mg total) by mouth at bedtime as needed.    Marland Kitchen aspirin 81 MG tablet Take 81 mg by mouth daily.      . brimonidine (ALPHAGAN) 0.2 % ophthalmic solution Place 1 drop into both eyes 2 (two) times daily. Place 1 drop into both eyes 2 (two) times daily.    . cetirizine (ZYRTEC) 10 MG tablet Take 10 mg by mouth daily.     . dorzolamide (TRUSOPT) 2 % ophthalmic solution Place 1 drop into both eyes 2 (two) times daily. Place 1 drop into both eyes 2 (two) times daily.    . Fluticasone-Salmeterol (ADVAIR DISKUS) 250-50 MCG/DOSE AEPB Inhale 1 puff into the lungs 2 (two) times daily. 60 each 3  . levothyroxine (SYNTHROID, LEVOTHROID) 50 MCG tablet Take 50 mcg by mouth daily before breakfast.    . LORazepam (ATIVAN) 0.5 MG tablet Take 0.5 mg by mouth 2 (two) times daily as needed (tremors).    . losartan (COZAAR) 100 MG tablet Take 50 mg by mouth 2 (two) times daily.    . meloxicam (MOBIC) 15 MG tablet Take 15 mg by mouth daily.    . metoprolol succinate (TOPROL-XL) 50 MG 24 hr tablet Take 1 tablet (50 mg total) by mouth at bedtime. Take with or immediately following a meal. 30 tablet 6  . Multiple Vitamins-Minerals (MULTIVITAMIN WITH MINERALS) tablet Take 1 tablet by mouth daily.      Marland Kitchen omeprazole (PRILOSEC) 20 MG capsule Take 1 capsule by mouth daily. Take 1 tab by mouth daily    . pravastatin (PRAVACHOL) 40 MG tablet Take 1 tablet (40 mg total) by mouth at bedtime.    Marland Kitchen Respiratory Therapy Supplies (FLUTTER) DEVI Use as directed. 1 each 0  . sertraline (ZOLOFT) 100 MG tablet Take 1 tablet (100 mg total) by mouth at bedtime. 2 at bedtime    . sodium chloride (OCEAN NASAL SPRAY) 0.65 % nasal spray Place 2 sprays into both nostrils at bedtime. Use as needed    . traMADol (ULTRAM) 50 MG tablet Take 50 mg by mouth 2 (two) times daily as needed (pain).     . traZODone (DESYREL) 50 MG tablet Take 50 mg by mouth at bedtime.      . triamcinolone (NASACORT ALLERGY 24HR) 55 MCG/ACT AERO nasal inhaler Place 1 spray  into the nose daily.    Marland Kitchen amLODipine (NORVASC) 5 MG tablet Take 1 tablet (5 mg total) by mouth daily after breakfast. 30 tablet 11   No current facility-administered medications for this visit.    Social History   Social History  . Marital Status: Widowed    Spouse Name: N/A  . Number of Children: 2  . Years of Education: 10th grade   Occupational History  .  Retired     Scientist, research (medical)   Social History Main Topics  . Smoking status: Never Smoker   . Smokeless tobacco: Never Used  . Alcohol Use: No  . Drug Use: No  . Sexual Activity: Not on file   Other Topics Concern  . Not on file   Social History Narrative   Widowed since 2002. Lives with her Daughter.   Socially she is widowed and has 2 children. No tobacco use. She does not routinely exercise.  Family History  Problem Relation Age of Onset  . Diabetes Father   . Throat cancer Mother   . Coronary artery disease Mother   . Prostate cancer Brother   . Asthma Daughter   . Scoliosis Daughter   . Esophageal cancer Neg Hx   . Rectal cancer Neg Hx   . Stomach cancer Neg Hx   . Colon cancer Paternal Grandfather    ROS General: Negative; No fevers, chills, or night sweats;  HEENT: Negative; No changes in vision or hearing, sinus congestion, difficulty swallowing Pulmonary: Positive for bronchiectasis; No cough, wheezing, shortness of breath, hemoptysis Cardiovascular: Negative; No chest pain, presyncope, syncope, palpitations GI: Positive for GERD; No nausea, vomiting, diarrhea, or abdominal pain GU: Negative; No dysuria, hematuria, or difficulty voiding Musculoskeletal: Positive for occasional arthritic symptoms in her neck. Hematologic/Oncology: Negative; no easy bruising, bleeding Endocrine: Positive for hypothyroidism on Synthroid replacement Neuro: Negative; no changes in balance, headaches Skin: Negative; No rashes or skin lesions Psychiatric: Negative; No behavioral problems, depression Sleep: She does not sleep  well.; No snoring, daytime sleepiness, hypersomnolence, bruxism, restless legs, hypnogognic hallucinations, no cataplexy Other comprehensive 14 point system review is negative.   PE BP 132/74 mmHg  Pulse 76  Ht '5\' 8"'  (1.727 m)  Wt 147 lb (66.679 kg)  BMI 22.36 kg/m2  Repeat blood pressure by me was 154/76  Wt Readings from Last 3 Encounters:  03/30/16 147 lb (66.679 kg)  03/29/16 147 lb (66.679 kg)  01/26/16 148 lb 3 oz (67.217 kg)   General: Alert, oriented, no distress.  Skin: normal turgor, no rashes HEENT: Normocephalic, atraumatic. Pupils round and reactive; sclera anicteric;no lid lag. Extraocular muscles intact;; no xanthelasmas. Nose without nasal septal hypertrophy Mouth/Parynx benign; Mallinpatti scale 2-3 Neck: No JVD, no carotid bruits; normal carotid upstroke Lungs: clear to ausculatation and percussion; no wheezing or rales Chest wall: no tenderness to palpitation Heart: RRR, s1 s2 normal; 1/6 systolic murmur along the LSB. no diastolic murmur, rub thrills or heaves Abdomen: soft, nontender; no hepatosplenomehaly, BS+; abdominal aorta nontender and not dilated by palpation. Back: no CVA tenderness Pulses 2+ Extremities: no clubbing cyanosis or edema, Homan's sign negative  Neurologic: grossly nonfocal; cranial nerves grossly normal. Psychologic: normal affect and mood.  ECG (independently read by me): Normal sinus rhythm at 76 bpm.  No ectopy.  QTc interval 429 ms.  February 2017 ECG (independently read by me): Normal sinus rhythm at 70 bpm.  QS complex V1 V2.  Nonspecific ST-T changes.  Normal intervals.  September 2016 ECG (independently read by me): Normal sinus rhythm at 72 bpm.  Nonspecific ST changes.  Q wave in lead aVL  Prior ECG (independently read by me): Sinus rhythm at 72 beats per minute. Small Q-wave in lead aVL. No significant ST changes.  LABS: Blood work from 01/24/2016 by Dr. Edwyna Ready call was reviewed.  BUN 14, creatinine 0.98.  Estimated GFR 55  mL's per minute.  Hb15/HCT 45.5. Total cholesterol 155, triglycerides 119, HDL 53, LDL 78.  Hemoglobin A1c 5.5 Free T4 1 0.2; TSH 1.77   BMP Latest Ref Rng 04/12/2014 01/18/2014 04/22/2009  Glucose 70 - 99 mg/dL 106(H) 128(H) 125(H)  BUN 6 - 23 mg/dL 19 16 3(L)  Creatinine 0.50 - 1.10 mg/dL 1.01 0.85 0.83  Sodium 135 - 145 mEq/L 133(L) 138 136  Potassium 3.5 - 5.3 mEq/L 3.9 4.0 3.5  Chloride 96 - 112 mEq/L 96 102 102  CO2 19 - 32 mEq/L '26 24 29  ' Calcium 8.4 - 10.5 mg/dL 8.9 9.1 8.2(L)   Hepatic Function Latest Ref Rng 04/12/2014 04/15/2009 03/01/2009  Total Protein 6.0 - 8.3 g/dL 6.1 6.7 7.2  Albumin 3.5 - 5.2 g/dL 3.8 3.9 4.2  AST 0 - 37 U/L '20 24 16  ' ALT 0 - 35 U/L '13 14 11  ' Alk Phosphatase 39 - 117 U/L 51 63 56  Total Bilirubin 0.2 - 1.2 mg/dL 0.6 0.6 0.6   CBC Latest Ref Rng 01/18/2014 04/22/2009 04/21/2009  WBC 4.0 - 10.5 K/uL 7.5 8.9 11.1(H)  Hemoglobin 12.0 - 15.0 g/dL 14.7 9.4(L) 10.3(L)  Hematocrit 36.0 - 46.0 % 42.7 27.0(L) 29.3(L)  Platelets 150 - 400 K/uL 180 157 170   Lab Results  Component Value Date   MCV 88.8 01/18/2014   MCV 89.6 04/22/2009   MCV 88.8 04/21/2009   Lab Results  Component Value Date   TSH 1.143 02/02/2014   No results found for: HGBA1C   Lipid Panel     Component Value Date/Time   CHOL 201* 03/01/2009 2017   TRIG 189* 03/01/2009 2017   HDL 47 03/01/2009 2017   CHOLHDL 4.3 Ratio 03/01/2009 2017   VLDL 38 03/01/2009 2017   LDLCALC 116* 03/01/2009 2017   RADIOLOGY: Dg Chest 2 View  01/18/2014   CLINICAL DATA:  Generalized weakness.  Body aches.  Hypertension.  EXAM: CHEST  2 VIEW  COMPARISON:  09/19/2011  FINDINGS: Elevated right hemidiaphragm. Lungs are clear. Heart is normal size. No effusions. No acute bony abnormality.  IMPRESSION: No active cardiopulmonary disease.   Electronically Signed   By: Rolm Baptise M.D.   On: 01/18/2014 23:49      ASSESSMENT AND PLAN: Christy Richardson is a 81 year old female who has a history of  bronchiectasis, mitral valve prolapse with palpitations, GERD, a history of a goiter, hypertension with documented grade 1 diastolic dysfunction and hyperlipidemia.  She has had some blood pressure lability with her blood pressure being elevated in the morning upon arising and at times dropping fairly low into the low 100s and rarely less than 100.  I last saw her, I changed her to long-acting metoprolol to avoid peaks and troughs of the tartrate preparation.  Presently, I will discontinue her hydralazine.  I will add amlodipine at 5 mg, which is a long-acting medication and she will take this in the morning.  I'm changing her Toprol-XL to 50 mg to take at bedtime.  I will also adjust her losartan from 100 mg at one time to 50 mg twice a day with improved with hopefully improved efficacy and peaks and troughs.  I reviewed her recent blood work.   She continues to be on Pravachol 40 mg for hyperlipidemia.  Her LDL is now excellent at 78.  Her sleep is poor, but she is adamant that she will never consider CPAP therapy if she was ever diagnosed as having sleep apnea.  There is no edema.  Her lungs are clear without wheezing with regards to her bronchiectasis.  She  will continue to monitor her blood pressure recordings.  I will see her in 2 months for reevaluation.  Time spent: 25 minutes  Troy Sine, MD, [facility]  03/31/2016 1:48 PM

## 2016-04-12 ENCOUNTER — Ambulatory Visit: Payer: Self-pay | Admitting: Cardiovascular Disease

## 2016-04-26 DIAGNOSIS — H401121 Primary open-angle glaucoma, left eye, mild stage: Secondary | ICD-10-CM | POA: Diagnosis not present

## 2016-05-10 ENCOUNTER — Other Ambulatory Visit: Payer: Self-pay | Admitting: Pulmonary Disease

## 2016-05-23 DIAGNOSIS — D225 Melanocytic nevi of trunk: Secondary | ICD-10-CM | POA: Diagnosis not present

## 2016-05-23 DIAGNOSIS — D2239 Melanocytic nevi of other parts of face: Secondary | ICD-10-CM | POA: Diagnosis not present

## 2016-05-23 DIAGNOSIS — D2262 Melanocytic nevi of left upper limb, including shoulder: Secondary | ICD-10-CM | POA: Diagnosis not present

## 2016-05-23 DIAGNOSIS — Z1283 Encounter for screening for malignant neoplasm of skin: Secondary | ICD-10-CM | POA: Diagnosis not present

## 2016-05-23 DIAGNOSIS — D485 Neoplasm of uncertain behavior of skin: Secondary | ICD-10-CM | POA: Diagnosis not present

## 2016-07-02 ENCOUNTER — Other Ambulatory Visit (HOSPITAL_COMMUNITY): Payer: Self-pay | Admitting: Internal Medicine

## 2016-07-02 DIAGNOSIS — N644 Mastodynia: Secondary | ICD-10-CM

## 2016-07-02 DIAGNOSIS — R0789 Other chest pain: Secondary | ICD-10-CM | POA: Diagnosis not present

## 2016-07-05 ENCOUNTER — Ambulatory Visit: Payer: Self-pay | Admitting: Cardiovascular Disease

## 2016-07-19 ENCOUNTER — Other Ambulatory Visit: Payer: Self-pay | Admitting: Cardiovascular Disease

## 2016-07-24 ENCOUNTER — Ambulatory Visit (HOSPITAL_COMMUNITY)
Admission: RE | Admit: 2016-07-24 | Discharge: 2016-07-24 | Disposition: A | Discharge status: Home / Self Care | Payer: PPO | Source: Ambulatory Visit | Attending: Internal Medicine | Admitting: Internal Medicine

## 2016-07-24 DIAGNOSIS — N644 Mastodynia: Secondary | ICD-10-CM | POA: Insufficient documentation

## 2016-07-24 DIAGNOSIS — R928 Other abnormal and inconclusive findings on diagnostic imaging of breast: Secondary | ICD-10-CM | POA: Diagnosis not present

## 2016-07-24 DIAGNOSIS — N6489 Other specified disorders of breast: Secondary | ICD-10-CM | POA: Diagnosis not present

## 2016-07-26 ENCOUNTER — Ambulatory Visit (INDEPENDENT_AMBULATORY_CARE_PROVIDER_SITE_OTHER): Payer: PPO | Admitting: Cardiovascular Disease

## 2016-07-26 ENCOUNTER — Encounter: Payer: Self-pay | Admitting: Cardiovascular Disease

## 2016-07-26 ENCOUNTER — Encounter (INDEPENDENT_AMBULATORY_CARE_PROVIDER_SITE_OTHER): Payer: Self-pay

## 2016-07-26 VITALS — BP 110/64 | HR 75 | Ht 68.0 in | Wt 148.8 lb

## 2016-07-26 DIAGNOSIS — I059 Rheumatic mitral valve disease, unspecified: Secondary | ICD-10-CM | POA: Diagnosis not present

## 2016-07-26 DIAGNOSIS — I519 Heart disease, unspecified: Secondary | ICD-10-CM | POA: Diagnosis not present

## 2016-07-26 DIAGNOSIS — R0602 Shortness of breath: Secondary | ICD-10-CM | POA: Diagnosis not present

## 2016-07-26 DIAGNOSIS — J479 Bronchiectasis, uncomplicated: Secondary | ICD-10-CM

## 2016-07-26 DIAGNOSIS — I10 Essential (primary) hypertension: Secondary | ICD-10-CM | POA: Diagnosis not present

## 2016-07-26 DIAGNOSIS — E785 Hyperlipidemia, unspecified: Secondary | ICD-10-CM

## 2016-07-26 DIAGNOSIS — I5189 Other ill-defined heart diseases: Secondary | ICD-10-CM

## 2016-07-26 NOTE — Patient Instructions (Addendum)
Medication Instructions:   Take 1/2 up to a full of norvasc in the AM   If your pressure is elevated in the Am, >160 Monitor pressure at lunch,  If running high, take the losartan 50   Stay on the evening losartan and metoprolol  When pressures are low, drink fluids, lay flat  Labwork:  No labs needed  Testing/Procedures:  No testing needed.  Follow-Up: It was a pleasure seeing you in the office today. Please call us if you have new issues that need to be addressed before your next appt.  [phone]  Your physician wants you to follow-up in: 6 months.  You will receive a reminder letter in the mail two months in advance. If you don't receive a letter, please call our office to schedule the follow-up appointment.  If you need a refill on your cardiac medications before your next appointment, please call your pharmacy.

## 2016-07-26 NOTE — Progress Notes (Signed)
Cardiology Office Note  Date:  07/26/2016   ID:  Christy Richardson, DOB Jul 15, 1935, MRN [id number]  PCP:  Wende Neighbors, MD   Chief Complaint  Patient presents with  . Other    Former Dr. Claiborne Billings patient to establish care for MVP. Meds reviewed by the patient verbally.     HPI:  Christy Richardson is a 81 y.o. female who presents To the [facility] office to establish care for labile hypertension.  history of mitral valve prolapse with palpitations, bronchiectasis, GERD, goirter, mixed hyperlipidemia, as well as hypertension with documented grade 1 diastolic dysfunction.   On her visit today, she reports having low pressures in the mornings after she takes her amlodipine. Does not drink much fluids Sometimes takes losartan in the morning if blood pressure is high. Particularly if she takes both locations in the morning, blood pressure can drop She does report periodic systolic pressure in the 90 range  Regimen as below typically amlodipine 5 in Am, sometimes with losartan 50 Losartan  50 9 pm Metoprolol 50 9pm   Denies any significant chest pain She has chronic shortness of breath on exertion which she attributes to her lung disease   She denies chest pain.  She denies PND, orthopnea.  She denies frank syncope.   She is unaware of recent palpitations.   Lab work from primary care reviewed Daughter who presents with her today is concerned about leg weakness  EKG on today's visit shows normal sinus rhythm with rate 77 bpm, no significant ST or T-wave changes  Other past medical history reviewed  In October 2012 a nuclear perfusion study revealed normal perfusion  echo Doppler study revealed normal systolic function with grade 1 diastolic dysfunction, mild aortic valve sclerosis without stenosis, trace AR, MR, and mild TR as well as PR. She had upper normal RV pressure 26 mm.  echo Doppler study on 02/02/2014.  This showed an ejection fraction of 60-65%.  She had grade 1 diastolic dysfunction.   There was mild aortic sclerosis without stenosis.     lexiscan perfusion study on 02/04/2014 was normal without scar or ischemia.  Post stress ejection fraction was 73%.     CardioNet monitor to assess for palpitations.  Unfortunately, she developed significant skin irritation, and only wore this for 9 days.  This revealed episodes of sinus rhythm, but she did have episodes of sinus tachycardia with rates in the low 100s.  There was there were no episodes of SVT or atrial fibrillation.   PMH:   has a past medical history of Adenomatous polyp of colon (03/2007); Allergic rhinitis; Anxiety; Blood transfusion; Bronchiectasis ([place]); Depression; Diabetes mellitus without complication ([place]); Diverticulosis of colon (without mention of hemorrhage); DOE (dyspnea on exertion); Fatigue; Fecal incontinence; GERD (gastroesophageal reflux disease); Glaucoma; Goiter; Heart murmur; Hyperlipidemia; Hypertension; Hypothyroidism; IBS (irritable bowel syndrome); Migraine; Mitral valve prolapse; Neck mass; Nonspecific abnormal electrocardiogram (ECG) (EKG); Palpitations; Polyp, sigmoid colon; Pulmonary nodules; Recurrent UTI; and Tremor.  PSH:    Past Surgical History:  Procedure Laterality Date  . ABDOMINAL HYSTERECTOMY    . CARDIOVASCULAR STRESS TEST  09/19/2011   No scintigraphic evidence of inducible myocardial ischemia. Pharmacological stress test without chest pain or EKG changes for ischemia.  . COLONOSCOPY    . ESOPHAGOGASTRODUODENOSCOPY    . MASTOIDECTOMY     rt ear  . OOPHORECTOMY    . TOTAL HIP ARTHROPLASTY  5/10   left  . TRANSTHORACIC ECHOCARDIOGRAM  09/19/2011   EF 123456, stage 1 diastolic dysfunction,  mild tricuspid valve regurg    Current Outpatient Prescriptions  Medication Sig Dispense Refill  . ADVAIR DISKUS 250-50 MCG/DOSE AEPB INHALE 1 PUFF TWICE DAILY. 60 each 5  . Alpha-D-Galactosidase (BEANO) TABS Take 1 tablet by mouth as needed (for gas).     . ALPRAZolam (XANAX) 1 MG tablet Take 1  tablet (1 mg total) by mouth at bedtime as needed.    Marland Kitchen amLODipine (NORVASC) 5 MG tablet Take 1 tablet (5 mg total) by mouth daily after breakfast. 30 tablet 11  . aspirin 81 MG tablet Take 81 mg by mouth daily.      . brimonidine (ALPHAGAN) 0.2 % ophthalmic solution Place 1 drop into both eyes 2 (two) times daily. Place 1 drop into both eyes 2 (two) times daily.    . cetirizine (ZYRTEC) 10 MG tablet Take 10 mg by mouth daily.     . dorzolamide (TRUSOPT) 2 % ophthalmic solution Place 1 drop into both eyes 2 (two) times daily. Place 1 drop into both eyes 2 (two) times daily.    Marland Kitchen levothyroxine (SYNTHROID, LEVOTHROID) 50 MCG tablet Take 50 mcg by mouth daily before breakfast.    . LORazepam (ATIVAN) 0.5 MG tablet Take 0.5 mg by mouth 2 (two) times daily as needed (tremors).    . losartan (COZAAR) 100 MG tablet Take 50 mg by mouth 2 (two) times daily.    . meloxicam (MOBIC) 15 MG tablet Take 15 mg by mouth daily.    . metoprolol succinate (TOPROL-XL) 50 MG 24 hr tablet Take 1 tablet (50 mg total) by mouth daily. KEEP OV. 30 tablet 0  . Multiple Vitamins-Minerals (MULTIVITAMIN WITH MINERALS) tablet Take 1 tablet by mouth daily.      . pantoprazole (PROTONIX) 40 MG tablet Take 40 mg by mouth daily.     . pravastatin (PRAVACHOL) 40 MG tablet Take 1 tablet (40 mg total) by mouth at bedtime.    Marland Kitchen Respiratory Therapy Supplies (FLUTTER) DEVI Use as directed. 1 each 0  . sertraline (ZOLOFT) 100 MG tablet Take 1 tablet (100 mg total) by mouth at bedtime. 2 at bedtime    . sodium chloride (OCEAN NASAL SPRAY) 0.65 % nasal spray Place 2 sprays into both nostrils at bedtime. Use as needed    . traMADol (ULTRAM) 50 MG tablet Take 50 mg by mouth 2 (two) times daily as needed (pain).     . traZODone (DESYREL) 50 MG tablet Take 50 mg by mouth at bedtime.      . triamcinolone (NASACORT ALLERGY 24HR) 55 MCG/ACT AERO nasal inhaler Place 1 spray into the nose daily.     No current facility-administered medications for  this visit.      Allergies:   Penicillins; Prednisone; and Sulfonamide derivatives   Social History:  The patient  reports that she has never smoked. She has never used smokeless tobacco. She reports that she does not drink alcohol or use drugs.   Family History:   family history includes Asthma in her daughter; Colon cancer in her paternal grandfather; Coronary artery disease in her mother; Diabetes in her father; Prostate cancer in her brother; Scoliosis in her daughter; Throat cancer in her mother.    Review of Systems: Review of Systems  Respiratory: Positive for shortness of breath.   Cardiovascular: Negative.   Gastrointestinal: Negative.   Musculoskeletal: Negative.   Neurological: Positive for weakness.  Psychiatric/Behavioral: Negative.   All other systems reviewed and are negative.    PHYSICAL EXAM: VS:  BP 110/64 (BP Location: Left Arm, Patient Position: Sitting, Cuff Size: Normal)   Pulse 75   Ht 5\' 8"  (1.727 m)   Wt 148 lb 12 oz (67.5 kg)   BMI 22.62 kg/m  , BMI Body mass index is 22.62 kg/m. GEN: Well nourished, well developed, in no acute distress  HEENT: normal  Neck: no JVD, carotid bruits, or masses Cardiac: RRR; no murmurs, rubs, or gallops,no edema  Respiratory:  clear to auscultation bilaterally, normal work of breathing GI: soft, nontender, nondistended, + BS MS: no deformity or atrophy  Skin: warm and dry, no rash Neuro:  Strength and sensation are intact Psych: euthymic mood, full affect    Recent Labs: No results found for requested labs within last 8760 hours.    Lipid Panel Lab Results  Component Value Date   CHOL 201 (H) 03/01/2009   HDL 47 03/01/2009   LDLCALC 116 (H) 03/01/2009   TRIG 189 (H) 03/01/2009      Wt Readings from Last 3 Encounters:  07/26/16 148 lb 12 oz (67.5 kg)  03/30/16 147 lb (66.7 kg)  03/29/16 147 lb (66.7 kg)       ASSESSMENT AND PLAN:  Essential hypertension - Plan: EKG 12-Lead Long discussion  concerning her blood pressure Recommended she hold the morning losartan, consider taking that dose at lunchtime if blood pressure is elevated If blood pressure runs low in the morning, would recommend she take amlodipine 2.5 mg rather than 5 mg. If blood pressure is elevated in the morning, recommended she take the full Norvasc 5 mg She'll continue on metoprolol in the evening given her history of palpitations She will take losartan 50 mg in the evening Recommended for low pressures she should drink more fluids  Mitral valve disorder - Plan: EKG 12-Lead No mention of significant mitral valve pathology on last echocardiogram March 2015  SOB (shortness of breath) - Plan: EKG 12-Lead Chronic shortness of breath, likely secondary to underlying lung disorder and conditioning Recommended regular walking program  Left ventricular diastolic dysfunction, NYHA class 1 Appears relatively euvolemic on today's visit  Hyperlipidemia LDL goal <70 Encouraged her to stay on her pravastatin  Bronchiectasis without complication ([place]) Followed by primary care and pulmonary Reports her symptoms are stable   Total encounter time more than 25 minutes  Greater than 50% was spent in counseling and coordination of care with the patient   Disposition:   F/U  6 months   Orders Placed This Encounter  Procedures  . EKG 12-Lead     Signed, Esmond Plants, M.D., Ph.D. 07/26/2016  [place], [place]

## 2016-08-14 DIAGNOSIS — E782 Mixed hyperlipidemia: Secondary | ICD-10-CM | POA: Diagnosis not present

## 2016-08-14 DIAGNOSIS — R7301 Impaired fasting glucose: Secondary | ICD-10-CM | POA: Diagnosis not present

## 2016-08-14 DIAGNOSIS — E039 Hypothyroidism, unspecified: Secondary | ICD-10-CM | POA: Diagnosis not present

## 2016-08-15 ENCOUNTER — Other Ambulatory Visit: Payer: Self-pay | Admitting: *Deleted

## 2016-08-15 DIAGNOSIS — E039 Hypothyroidism, unspecified: Secondary | ICD-10-CM | POA: Diagnosis not present

## 2016-08-15 DIAGNOSIS — E782 Mixed hyperlipidemia: Secondary | ICD-10-CM | POA: Diagnosis not present

## 2016-08-15 DIAGNOSIS — J060 Acute laryngopharyngitis: Secondary | ICD-10-CM | POA: Diagnosis not present

## 2016-08-15 DIAGNOSIS — H409 Unspecified glaucoma: Secondary | ICD-10-CM | POA: Diagnosis not present

## 2016-08-15 DIAGNOSIS — J479 Bronchiectasis, uncomplicated: Secondary | ICD-10-CM | POA: Diagnosis not present

## 2016-08-15 DIAGNOSIS — R7301 Impaired fasting glucose: Secondary | ICD-10-CM | POA: Diagnosis not present

## 2016-08-15 DIAGNOSIS — Z23 Encounter for immunization: Secondary | ICD-10-CM | POA: Diagnosis not present

## 2016-08-15 DIAGNOSIS — I10 Essential (primary) hypertension: Secondary | ICD-10-CM | POA: Diagnosis not present

## 2016-08-15 DIAGNOSIS — E875 Hyperkalemia: Secondary | ICD-10-CM | POA: Diagnosis not present

## 2016-08-15 DIAGNOSIS — K219 Gastro-esophageal reflux disease without esophagitis: Secondary | ICD-10-CM | POA: Diagnosis not present

## 2016-08-15 MED ORDER — METOPROLOL SUCCINATE ER 50 MG PO TB24: 50.0000 mg | ORAL_TABLET | Freq: Every day | ORAL | 3 refills | Status: DC

## 2016-08-23 DIAGNOSIS — H7011 Chronic mastoiditis, right ear: Secondary | ICD-10-CM | POA: Diagnosis not present

## 2016-08-23 DIAGNOSIS — J4520 Mild intermittent asthma, uncomplicated: Secondary | ICD-10-CM | POA: Diagnosis not present

## 2016-08-23 DIAGNOSIS — K219 Gastro-esophageal reflux disease without esophagitis: Secondary | ICD-10-CM | POA: Diagnosis not present

## 2016-09-11 ENCOUNTER — Other Ambulatory Visit: Payer: Self-pay | Admitting: *Deleted

## 2016-09-11 MED ORDER — AMLODIPINE BESYLATE 5 MG PO TABS: 5.0000 mg | ORAL_TABLET | Freq: Every day | ORAL | 5 refills | Status: DC

## 2016-09-18 DIAGNOSIS — I10 Essential (primary) hypertension: Secondary | ICD-10-CM | POA: Diagnosis not present

## 2016-09-18 DIAGNOSIS — E039 Hypothyroidism, unspecified: Secondary | ICD-10-CM | POA: Diagnosis not present

## 2016-09-19 DIAGNOSIS — K580 Irritable bowel syndrome with diarrhea: Secondary | ICD-10-CM | POA: Diagnosis not present

## 2016-09-19 DIAGNOSIS — N390 Urinary tract infection, site not specified: Secondary | ICD-10-CM | POA: Diagnosis not present

## 2016-10-23 DIAGNOSIS — H401121 Primary open-angle glaucoma, left eye, mild stage: Secondary | ICD-10-CM | POA: Diagnosis not present

## 2016-10-29 DIAGNOSIS — H401121 Primary open-angle glaucoma, left eye, mild stage: Secondary | ICD-10-CM | POA: Diagnosis not present

## 2016-10-31 ENCOUNTER — Other Ambulatory Visit: Payer: Self-pay | Admitting: Pharmacist

## 2016-10-31 NOTE — Patient Outreach (Signed)
Outreach call to Christy Richardson regarding her request for follow up from the [facility] Medication Adherence Campaign. HIPAA identifiers verified and verbal consent received.   Patient reports that she has been taking her pravastatin as directed. Denies any missed doses or any barriers to taking her medications such as cost or side effects.   Patient reports that she has no medication questions or concerns at this time.  Harlow Asa, PharmD Clinical Pharmacist [facility] [phone]

## 2016-11-28 DIAGNOSIS — Z0000 Encounter for general adult medical examination without abnormal findings: Secondary | ICD-10-CM | POA: Diagnosis not present

## 2016-12-21 ENCOUNTER — Other Ambulatory Visit: Payer: Self-pay | Admitting: Cardiovascular Disease

## 2016-12-21 MED ORDER — AMLODIPINE BESYLATE 5 MG PO TABS: 5.0000 mg | ORAL_TABLET | Freq: Every day | ORAL | 3 refills | Status: DC

## 2017-01-24 ENCOUNTER — Ambulatory Visit (INDEPENDENT_AMBULATORY_CARE_PROVIDER_SITE_OTHER): Payer: PPO | Admitting: Cardiovascular Disease

## 2017-01-24 ENCOUNTER — Encounter: Payer: Self-pay | Admitting: Cardiovascular Disease

## 2017-01-24 VITALS — BP 110/62 | HR 75 | Ht 68.0 in | Wt 156.0 lb

## 2017-01-24 DIAGNOSIS — E785 Hyperlipidemia, unspecified: Secondary | ICD-10-CM

## 2017-01-24 DIAGNOSIS — I10 Essential (primary) hypertension: Secondary | ICD-10-CM | POA: Diagnosis not present

## 2017-01-24 NOTE — Progress Notes (Signed)
Cardiology Office Note  Date:  01/24/2017   ID:  Christy Richardson, DOB 01-03-1935, MRN [id number]  PCP:  Wende Neighbors, MD   Chief Complaint  Patient presents with  . other    6 mo. f/u. Pt c/o shortness of breathe, legs get restless anytime but mostly at night.  Meds reviewed verbally with patient.    HPI:  Christy Richardson a 82 y.o.femaleWith a history of labile hypertension, mitral valve prolapse per the patient with no mitral valve regurgitation on echocardiogram 2015 with history of palpitations, bronchiectasis, GERD, goirter, mixed hyperlipidemia, as well as hypertension with documented grade 1 diastolic dysfunction.  She presents today for routine follow-up of her labile blood pressure  In general she reports that she feels well with no complaints Denies any significant chest pain She has chronic shortness of breath on exertion which she attributes to her lung disease  Blood pressure medication Regimen as below typically amlodipine 5 in Am, sometimes with losartan 50 only for high blood pressure)( Losartan  50 at 9 pm Metoprolol 50 at 9pm   Blood pressures were reviewed from the past several weeks showing systolic pressure ranging from 110 up to 160 On average 130 up to 140. These her morning blood pressures  Sometimes has Pain in her neck, chronic issue  continued leg weakness. No regular exercise  Total cholesterol 155 on lab work from 2017 with primary care  EKG on today's visit shows normal sinus rhythm with rate 75 bpm, no significant ST or T-wave changes  Other past medical history reviewed  In October 2012 a nuclear perfusion study revealed normal perfusion  echo Doppler study revealed normal systolic function with grade 1 diastolic dysfunction, mild aortic valve sclerosis without stenosis, trace AR, MR, and mild TR as well as PR. She had upper normal RV pressure 26 mm.  echo Doppler study on 02/02/2014. This showed an ejection fraction of 60-65%. She had grade 1  diastolic dysfunction. There was mild aortic sclerosis without stenosis.    lexiscan perfusion study on 02/04/2014 was normal without scar or ischemia. Post stress ejection fraction was 73%.    CardioNet monitor to assess for palpitations. Unfortunately, she developed significant skin irritation, and only wore this for 9 days. This revealed episodes of sinus rhythm, but she did have episodes of sinus tachycardia with rates in the low 100s. There was there were no episodes of SVT or atrial fibrillation.   PMH:   has a past medical history of Adenomatous polyp of colon (03/2007); Allergic rhinitis; Anxiety; Blood transfusion; Bronchiectasis ([place]); Depression; Diabetes mellitus without complication ([place]); Diverticulosis of colon (without mention of hemorrhage); DOE (dyspnea on exertion); Fatigue; Fecal incontinence; GERD (gastroesophageal reflux disease); Glaucoma; Goiter; Heart murmur; Hyperlipidemia; Hypertension; Hypothyroidism; IBS (irritable bowel syndrome); Migraine; Mitral valve prolapse; Neck mass; Nonspecific abnormal electrocardiogram (ECG) (EKG); Palpitations; Polyp, sigmoid colon; Pulmonary nodules; Recurrent UTI; and Tremor.  PSH:    Past Surgical History:  Procedure Laterality Date  . ABDOMINAL HYSTERECTOMY    . CARDIOVASCULAR STRESS TEST  09/19/2011   No scintigraphic evidence of inducible myocardial ischemia. Pharmacological stress test without chest pain or EKG changes for ischemia.  . COLONOSCOPY    . ESOPHAGOGASTRODUODENOSCOPY    . MASTOIDECTOMY     rt ear  . OOPHORECTOMY    . TOTAL HIP ARTHROPLASTY  5/10   left  . TRANSTHORACIC ECHOCARDIOGRAM  09/19/2011   EF 123456, stage 1 diastolic dysfunction, mild tricuspid valve regurg    Current Outpatient Prescriptions  Medication  Sig Dispense Refill  . ADVAIR DISKUS 250-50 MCG/DOSE AEPB INHALE 1 PUFF TWICE DAILY. 60 each 5  . Alpha-D-Galactosidase (BEANO) TABS Take 1 tablet by mouth as needed (for gas).     . ALPRAZolam  (XANAX) 1 MG tablet Take 1 tablet (1 mg total) by mouth at bedtime as needed.    Marland Kitchen amLODipine (NORVASC) 5 MG tablet Take 1 tablet (5 mg total) by mouth daily after breakfast. 30 tablet 3  . aspirin 81 MG tablet Take 81 mg by mouth daily.      . brimonidine (ALPHAGAN) 0.2 % ophthalmic solution Place 1 drop into both eyes 2 (two) times daily. Place 1 drop into both eyes 2 (two) times daily.    . cetirizine (ZYRTEC) 10 MG tablet Take 10 mg by mouth daily.     . dorzolamide (TRUSOPT) 2 % ophthalmic solution Place 1 drop into both eyes 2 (two) times daily. Place 1 drop into both eyes 2 (two) times daily.    Marland Kitchen levothyroxine (SYNTHROID, LEVOTHROID) 50 MCG tablet Take 50 mcg by mouth daily before breakfast.    . LORazepam (ATIVAN) 0.5 MG tablet Take 0.5 mg by mouth 2 (two) times daily as needed (tremors).    . losartan (COZAAR) 100 MG tablet Take 50 mg by mouth 2 (two) times daily.    . meloxicam (MOBIC) 15 MG tablet Take 15 mg by mouth daily.    . metoprolol succinate (TOPROL-XL) 50 MG 24 hr tablet Take 1 tablet (50 mg total) by mouth daily. KEEP OV. 30 tablet 3  . Multiple Vitamins-Minerals (MULTIVITAMIN WITH MINERALS) tablet Take 1 tablet by mouth daily.      . pantoprazole (PROTONIX) 40 MG tablet Take 40 mg by mouth daily.     . pravastatin (PRAVACHOL) 40 MG tablet Take 1 tablet (40 mg total) by mouth at bedtime.    Marland Kitchen Respiratory Therapy Supplies (FLUTTER) DEVI Use as directed. 1 each 0  . sertraline (ZOLOFT) 100 MG tablet Take 1 tablet (100 mg total) by mouth at bedtime. 2 at bedtime    . sodium chloride (OCEAN NASAL SPRAY) 0.65 % nasal spray Place 2 sprays into both nostrils at bedtime. Use as needed    . traMADol (ULTRAM) 50 MG tablet Take 50 mg by mouth 2 (two) times daily as needed (pain).     . traZODone (DESYREL) 50 MG tablet Take 50 mg by mouth at bedtime.      . triamcinolone (NASACORT ALLERGY 24HR) 55 MCG/ACT AERO nasal inhaler Place 1 spray into the nose daily.     No current  facility-administered medications for this visit.      Allergies:   Penicillins; Prednisone; and Sulfonamide derivatives   Social History:  The patient  reports that she has never smoked. She has never used smokeless tobacco. She reports that she does not drink alcohol or use drugs.   Family History:   family history includes Asthma in her daughter; Colon cancer in her paternal grandfather; Coronary artery disease in her mother; Diabetes in her father; Prostate cancer in her brother; Scoliosis in her daughter; Throat cancer in her mother.    Review of Systems: Review of Systems  Constitutional: Negative.   Respiratory: Negative.   Cardiovascular: Negative.   Gastrointestinal: Negative.   Musculoskeletal: Negative.   Neurological: Negative.   Psychiatric/Behavioral: Negative.   All other systems reviewed and are negative.    PHYSICAL EXAM: VS:  BP 110/62 (BP Location: Left Arm, Patient Position: Sitting, Cuff Size: Normal)  Pulse 75   Ht 5\' 8"  (1.727 m)   Wt 156 lb (70.8 kg)   BMI 23.72 kg/m  , BMI Body mass index is 23.72 kg/m. GEN: Well nourished, well developed, in no acute distress  HEENT: normal  Neck: no JVD, carotid bruits, or masses Cardiac: RRR; no murmurs, rubs, or gallops,no edema  Respiratory:  clear to auscultation bilaterally, normal work of breathing GI: soft, nontender, nondistended, + BS MS: no deformity or atrophy  Skin: warm and dry, no rash Neuro:  Strength and sensation are intact Psych: euthymic mood, full affect    Recent Labs: No results found for requested labs within last 8760 hours.    Lipid Panel Lab Results  Component Value Date   CHOL 201 (H) 03/01/2009   HDL 47 03/01/2009   LDLCALC 116 (H) 03/01/2009   TRIG 189 (H) 03/01/2009      Wt Readings from Last 3 Encounters:  01/24/17 156 lb (70.8 kg)  07/26/16 148 lb 12 oz (67.5 kg)  03/30/16 147 lb (66.7 kg)       ASSESSMENT AND PLAN:  Hyperlipidemia LDL goal <70 - Plan: EKG  12-Lead Cholesterol is at goal on the current lipid regimen. No changes to the medications were made.  Essential hypertension - Plan: EKG 12-Lead Long discussion concerning her blood pressure Somewhat labile in the mornings Despite this would continue her current regimen as detailed above When overmedicated she has orthostasis and is symptomatic   Total encounter time more than 15 minutes  Greater than 50% was spent in counseling and coordination of care with the patient   Disposition:   F/U  12 months   Orders Placed This Encounter  Procedures  . EKG 12-Lead     Signed, Esmond Plants, M.D., Ph.D. 01/24/2017  [place], [place]

## 2017-01-24 NOTE — Patient Instructions (Addendum)

## 2017-01-31 DIAGNOSIS — Z6825 Body mass index (BMI) 25.0-25.9, adult: Secondary | ICD-10-CM | POA: Diagnosis not present

## 2017-01-31 DIAGNOSIS — J479 Bronchiectasis, uncomplicated: Secondary | ICD-10-CM | POA: Diagnosis not present

## 2017-01-31 DIAGNOSIS — J209 Acute bronchitis, unspecified: Secondary | ICD-10-CM | POA: Diagnosis not present

## 2017-01-31 DIAGNOSIS — R05 Cough: Secondary | ICD-10-CM | POA: Diagnosis not present

## 2017-02-12 ENCOUNTER — Other Ambulatory Visit: Payer: Self-pay | Admitting: *Deleted

## 2017-02-12 MED ORDER — METOPROLOL SUCCINATE ER 50 MG PO TB24: 50.0000 mg | ORAL_TABLET | Freq: Every day | ORAL | 3 refills | Status: DC

## 2017-02-13 DIAGNOSIS — E782 Mixed hyperlipidemia: Secondary | ICD-10-CM | POA: Diagnosis not present

## 2017-02-13 DIAGNOSIS — R7301 Impaired fasting glucose: Secondary | ICD-10-CM | POA: Diagnosis not present

## 2017-02-13 DIAGNOSIS — E039 Hypothyroidism, unspecified: Secondary | ICD-10-CM | POA: Diagnosis not present

## 2017-02-14 DIAGNOSIS — E039 Hypothyroidism, unspecified: Secondary | ICD-10-CM | POA: Diagnosis not present

## 2017-02-14 DIAGNOSIS — I10 Essential (primary) hypertension: Secondary | ICD-10-CM | POA: Diagnosis not present

## 2017-02-14 DIAGNOSIS — R7301 Impaired fasting glucose: Secondary | ICD-10-CM | POA: Diagnosis not present

## 2017-02-14 DIAGNOSIS — H409 Unspecified glaucoma: Secondary | ICD-10-CM | POA: Diagnosis not present

## 2017-02-14 DIAGNOSIS — K219 Gastro-esophageal reflux disease without esophagitis: Secondary | ICD-10-CM | POA: Diagnosis not present

## 2017-02-14 DIAGNOSIS — J479 Bronchiectasis, uncomplicated: Secondary | ICD-10-CM | POA: Diagnosis not present

## 2017-02-14 DIAGNOSIS — E782 Mixed hyperlipidemia: Secondary | ICD-10-CM | POA: Diagnosis not present

## 2017-02-14 DIAGNOSIS — J060 Acute laryngopharyngitis: Secondary | ICD-10-CM | POA: Diagnosis not present

## 2017-02-14 DIAGNOSIS — M542 Cervicalgia: Secondary | ICD-10-CM | POA: Diagnosis not present

## 2017-02-14 DIAGNOSIS — Z6825 Body mass index (BMI) 25.0-25.9, adult: Secondary | ICD-10-CM | POA: Diagnosis not present

## 2017-02-19 DIAGNOSIS — M542 Cervicalgia: Secondary | ICD-10-CM | POA: Diagnosis not present

## 2017-02-19 DIAGNOSIS — M25511 Pain in right shoulder: Secondary | ICD-10-CM | POA: Diagnosis not present

## 2017-02-19 DIAGNOSIS — R5383 Other fatigue: Secondary | ICD-10-CM | POA: Diagnosis not present

## 2017-02-19 DIAGNOSIS — E559 Vitamin D deficiency, unspecified: Secondary | ICD-10-CM | POA: Diagnosis not present

## 2017-02-19 DIAGNOSIS — M810 Age-related osteoporosis without current pathological fracture: Secondary | ICD-10-CM | POA: Diagnosis not present

## 2017-02-19 DIAGNOSIS — M25512 Pain in left shoulder: Secondary | ICD-10-CM | POA: Diagnosis not present

## 2017-02-21 DIAGNOSIS — H7011 Chronic mastoiditis, right ear: Secondary | ICD-10-CM | POA: Diagnosis not present

## 2017-02-21 DIAGNOSIS — H903 Sensorineural hearing loss, bilateral: Secondary | ICD-10-CM | POA: Diagnosis not present

## 2017-02-21 DIAGNOSIS — J340 Abscess, furuncle and carbuncle of nose: Secondary | ICD-10-CM | POA: Diagnosis not present

## 2017-03-01 DIAGNOSIS — M542 Cervicalgia: Secondary | ICD-10-CM | POA: Diagnosis not present

## 2017-03-19 DIAGNOSIS — M542 Cervicalgia: Secondary | ICD-10-CM | POA: Diagnosis not present

## 2017-03-19 DIAGNOSIS — M47812 Spondylosis without myelopathy or radiculopathy, cervical region: Secondary | ICD-10-CM | POA: Diagnosis not present

## 2017-03-25 ENCOUNTER — Ambulatory Visit (HOSPITAL_COMMUNITY): Payer: PPO | Attending: Physical Medicine and Rehabilitation

## 2017-03-25 DIAGNOSIS — R293 Abnormal posture: Secondary | ICD-10-CM | POA: Diagnosis not present

## 2017-03-25 DIAGNOSIS — M6281 Muscle weakness (generalized): Secondary | ICD-10-CM | POA: Insufficient documentation

## 2017-03-25 DIAGNOSIS — M542 Cervicalgia: Secondary | ICD-10-CM | POA: Diagnosis not present

## 2017-03-25 NOTE — Therapy (Signed)
[place], [place], [address] Phone: [phone]   Fax:  [phone]  Physical Therapy Evaluation  Patient Details  Name: Christy Richardson MRN: [id number] Date of Birth: 1935/05/16 Referring Provider: Laroy Apple, MD  Encounter Date: 03/25/2017      PT End of Session - 03/25/17 1136    Visit Number 1   Number of Visits 16   Date for PT Re-Evaluation 04/22/17   Authorization Type [employer]    Authorization Time Period 03/25/17 to 05/20/17   PT Start Time 1035   PT Stop Time 1115   PT Time Calculation (min) 40 min   Activity Tolerance Patient tolerated treatment well   Behavior During Therapy [facility] for tasks assessed/performed      Past Medical History:  Diagnosis Date  . Adenomatous polyp of colon 03/2007  . Allergic rhinitis   . Anxiety   . Blood transfusion   . Bronchiectasis ([place])   . Depression   . Diabetes mellitus without complication ([place])   . Diverticulosis of colon (without mention of hemorrhage)   . DOE (dyspnea on exertion)   . Fatigue   . Fecal incontinence   . GERD (gastroesophageal reflux disease)   . Glaucoma   . Goiter   . Heart murmur   . Hyperlipidemia   . Hypertension   . Hypothyroidism   . IBS (irritable bowel syndrome)   . Migraine   . Mitral valve prolapse   . Neck mass   . Nonspecific abnormal electrocardiogram (ECG) (EKG)   . Palpitations   . Polyp, sigmoid colon   . Pulmonary nodules   . Recurrent UTI   . Tremor     Past Surgical History:  Procedure Laterality Date  . ABDOMINAL HYSTERECTOMY    . CARDIOVASCULAR STRESS TEST  09/19/2011   No scintigraphic evidence of inducible myocardial ischemia. Pharmacological stress test without chest pain or EKG changes for ischemia.  . COLONOSCOPY    . ESOPHAGOGASTRODUODENOSCOPY    . MASTOIDECTOMY     rt ear  . OOPHORECTOMY    . TOTAL HIP ARTHROPLASTY  5/10   left  . TRANSTHORACIC ECHOCARDIOGRAM  09/19/2011   EF  >96%, stage 1 diastolic dysfunction, mild tricuspid valve regurg    There were no vitals filed for this visit.       Subjective Assessment - 03/25/17 1040    Subjective Pt states that she has been having neck pain for approximately 5 years, but it has gotten worse in the last 1.5-2 years. Her neck pain is worse on the L, but she still has pain on the R. She states that any movement of her neck is painful. She denies any n/t down BUE and denies b/b changes. She does not feel like her arms are weaker but she does state that when she lifts something heavy, she can feel it in her neck, especially on her L side with L hand. She states that she has a high co-pay and can't afford to come to a lot of sessions. She states that driving, sleeping, washing her hair, and performing household chores are all difficult due to the pain.   Pertinent History HTN, osteoporosis, arthritis   Limitations Lifting;House hold activities   How long can you sit comfortably? no issues   How long can you stand comfortably? no issues   How long can you walk comfortably? no issues   Patient Stated Goals to get some relief   Currently in  Pain? Yes   Pain Score 6    Pain Location Neck   Pain Orientation Left;Right  L > R   Pain Descriptors / Indicators Sharp   Pain Type Chronic pain   Pain Onset More than a month ago   Pain Frequency Intermittent   Aggravating Factors  moving it in all directions, lifting objects   Pain Relieving Factors rest, pain meds   Effect of Pain on Daily Activities increases   Multiple Pain Sites No            [facility] PT Assessment - 03/25/17 0001      Assessment   Medical Diagnosis Left cervicalgia and underlying cervical spondylosis   Referring Provider Laroy Apple, MD   Onset Date/Surgical Date 03/25/12   Prior Therapy for hip replacement     Balance Screen   Has the patient fallen in the past 6 months No   Has the patient had a decrease in activity level because of a fear of  falling?  No   Is the patient reluctant to leave their home because of a fear of falling?  No     Prior Function   Level of Independence Independent   Vocation Retired     Observation/Other Assessments   Focus on Therapeutic Outcomes (FOTO)  71%     Sensation   Light Touch Appears Intact     Posture/Postural Control   Posture/Postural Control Postural limitations   Postural Limitations Rounded Shoulders;Forward head;Increased thoracic kyphosis   Posture Comments in sitting and standing     ROM / Strength   AROM / PROM / Strength AROM;Strength     AROM   Cervical Flexion 22  stretching sensation   Cervical Extension 26  pain   Cervical - Right Side Bend 15  pain   Cervical - Left Side Bend 12  pain   Cervical - Right Rotation 38  pain   Cervical - Left Rotation 38  pain     Strength   Right Shoulder Flexion 4+/5   Right Shoulder ABduction 4+/5   Right Shoulder Internal Rotation 5/5   Right Shoulder External Rotation 5/5   Left Shoulder Flexion 4+/5  pain   Left Shoulder ABduction 4/5  pain   Left Shoulder Internal Rotation 5/5  pain   Left Shoulder External Rotation 5/5  pain   Right Elbow Flexion 5/5   Right Elbow Extension 5/5   Left Elbow Flexion 5/5   Left Elbow Extension 5/5   Right Wrist Flexion 5/5   Right Wrist Extension 5/5   Left Wrist Flexion 5/5   Left Wrist Extension 5/5   Right Hand Gross Grasp Functional   Left Hand Gross Grasp Functional     Palpation   Palpation comment mod to max muscle spasms and soft tissue restrictions of bil UT, levator scap, periscapular muscles, cervical paraspinals, suboccipitals, SCM and painful to palpation throughout, L>R.              PT Education - 03/25/17 1134    Education provided Yes   Education Details exam findings, POC, HEP   Person(s) Educated Patient   Methods Explanation;Demonstration;Handout   Comprehension Verbalized understanding;Returned demonstration;Verbal cues required;Tactile  cues required          PT Short Term Goals - 03/25/17 1145      PT SHORT TERM GOAL #1   Title Pt will be independent with HEP and perform consistently to maximize recovery and decrease pain overall.    Time  2   Period Weeks   Status New     PT SHORT TERM GOAL #2   Title Pt will be able to attain and maintain proper sitting posture 50% of the time with minimal to no cueing from PT to decrease pain and maximize overall function.   Time 4   Period Weeks   Status New     PT SHORT TERM GOAL #3   Title Pt will demonstrate improved overall BUE strength to 5/5 with no pain to demonstrate improved overall function and decreased pain.   Time 4   Period Weeks   Status New           PT Long Term Goals - 03/25/17 1147      PT LONG TERM GOAL #1   Title Pt will have improved overall neck AROM by 15deg or > with no pain to maximize her driving ability and demonstrate improved overall function.   Time 8   Period Weeks   Status New     PT LONG TERM GOAL #2   Title Pt will report being able to sleep through the night with no awakenings due to pain to maximize recovery and demonstrate improved overall function.   Time 8   Period Weeks   Status New     PT LONG TERM GOAL #3   Title Pt will report being able to lift a gallon of milk with her L hand without neck pain to maximize function at home.   Time 8   Period Weeks   Status New     PT LONG TERM GOAL #4   Title Pt will have minimal to no muscle spasms in her cervical paraspinals, suboccipitals, UT, levator scap and periscapular musculature to maximize AROM and decrease pain overall.   Time 8   Period Weeks   Status New               Plan - 03/25/17 1138    Clinical Impression Statement Pt is pleasant 82 YO F who presents to therapy with c/o chronic bil neck pain, L>R. Pt has significant deficits in posture, increased muscle spasms and soft tissue restrictions of bil UT, levator scap, and periscapular musculature,  deficits in AROM, mild weakness in bil shoulders, and increased pain. Pt has difficulty driving, sleeping, lifting objects and moving her neck in all directions. Pt verbalized that she has a high co-pay and did not want to come for 2-3x/week for multiple weeks. PT explained to pt that she would benefit from manual therapy and the initial sessions would mainly focus on this but it would be more beneficial is she came 2x/week at the start of her POC. Pt verbalized understanding and wished to come 2x/week for the first 2 weeks and then decrease her frequency to 1x/week for the remainder of her POC. Pt needs skilled PT intervention to address the deficits listed in order to decrease pain and improve her overall function and QoL.   Rehab Potential Good   PT Frequency 2x / week   PT Duration 8 weeks   PT Treatment/Interventions ADLs/Self Care Home Management;Moist Heat;Therapeutic activities;Therapeutic exercise;Neuromuscular re-education;Patient/family education;Manual techniques;Passive range of motion;Dry needling;Taping   PT Next Visit Plan review eval and goals, manual therapy to cervical and periscapular musculature, serratus strengthening, postural strengthening; educate to sleep with towel roll in pillow case; Update HEP each session   PT Home Exercise Plan eval: supine cervical retraction, standing scap retraction with RTB, sitting with lumbar roll   Consulted  and Agree with Plan of Care Patient      Patient will benefit from skilled therapeutic intervention in order to improve the following deficits and impairments:  Decreased range of motion, Decreased strength, Increased muscle spasms, Impaired flexibility, Improper body mechanics, Postural dysfunction, Pain  Visit Diagnosis: Cervicalgia - Plan: PT plan of care cert/re-cert  Abnormal posture - Plan: PT plan of care cert/re-cert  Muscle weakness (generalized) - Plan: PT plan of care cert/re-cert      G-Codes - 57/90/38 1152    Functional  Assessment Tool Used (Outpatient Only) FOTO, AROM, clinical judgement   Functional Limitation Carrying, moving and handling objects   Carrying, Moving and Handling Objects Current Status (B3383) At least 60 percent but less than 80 percent impaired, limited or restricted   Carrying, Moving and Handling Objects Goal Status (A9191) At least 20 percent but less than 40 percent impaired, limited or restricted       Problem List Patient Active Problem List   Diagnosis Date Noted  . Hyperlipidemia LDL goal <70 08/16/2015  . Left ventricular diastolic dysfunction, NYHA class 1 03/13/2014  . Chest pain 01/30/2014  . Tremor, essential 05/07/2013  . Fecal incontinence 09/30/2012  . Pulmonary nodules 05/15/2012  . Dyspnea 05/27/2011  . Bronchiectasis without acute exacerbation ([place]) 09/26/2009  . OSTEOPENIA 03/17/2009  . COLONIC POLYPS, ADENOMATOUS, HX OF 09/27/2008  . BACK PAIN 09/01/2008  . LIPOMA 06/01/2008  . Mitral valve disorder 01/12/2008  . UTI'S, RECURRENT 06/10/2007  . Hypothyroidism 01/07/2007  . HYPERLIPIDEMIA 01/07/2007  . ANXIETY 01/07/2007  . DEPRESSION 01/07/2007  . MIGRAINE HEADACHE 01/07/2007  . GLAUCOMA NEC 01/07/2007  . DECREASED HEARING 01/07/2007  . Essential hypertension 01/07/2007  . Allergic rhinitis 01/07/2007  . GERD 01/07/2007  . IBS 01/07/2007  . OSTEOARTHRITIS 01/07/2007     Geraldine Solar PT, DPT   [place] [address] [place], [place], [address] Phone: [phone]   Fax:  [phone]  Name: BATOOL MAJID MRN: [id number] Date of Birth: 25-Jun-1935

## 2017-03-25 NOTE — Patient Instructions (Signed)
  Seated Posture with Lumbar Roll  Place the lumbar roll as shown in the picture on the left.  Slide your bottom to the back of the chair and sit up straight so the roll provides support in the natural curve of your lower back.  Keep your shoulders back and head in a neutral position.  Maintain this position at all time when sitting.  Try for 1 hour per day.   CERVICAL CHIN TUCK  AND RETRACTION - SUPINE WITH TOWEL  While lying on your back with a small folded up towel under your head or pillow, tuck your chin towards your chest. Also, focus on putting pressure on the towel with the back of your head.   Maintain contact of head with the towel the entire time.   Perform 1x/day, 2-3 sets of 10 reps   Scapular Retraction  Start: Position your arm at 90 degrees by your side with Theraband in hand as pictured.  Movement: Against the resistance of the band, squeeze your shoulder blades together as you stick your chest out. Slow and controlled movement. return to start position.  *Note-you should not be pulling with your arms, this will only round your shoulders. The movement we want her is initiated by your shoulder blades, your arms are just holding the resistance.  Perform 1x/day, 2-3 sets of 10 with red band.

## 2017-03-28 ENCOUNTER — Ambulatory Visit (HOSPITAL_COMMUNITY): Payer: PPO

## 2017-03-28 ENCOUNTER — Encounter (HOSPITAL_COMMUNITY): Payer: Self-pay

## 2017-03-28 DIAGNOSIS — R293 Abnormal posture: Secondary | ICD-10-CM

## 2017-03-28 DIAGNOSIS — M542 Cervicalgia: Secondary | ICD-10-CM | POA: Diagnosis not present

## 2017-03-28 DIAGNOSIS — M6281 Muscle weakness (generalized): Secondary | ICD-10-CM

## 2017-03-28 NOTE — Therapy (Signed)
[place], [place], [address] Phone: [phone]   Fax:  [phone]  Physical Therapy Treatment  Patient Details  Name: Christy Richardson MRN: [id number] Date of Birth: Aug 09, 1935 Referring Provider: Laroy Apple, MD  Encounter Date: 03/28/2017      PT End of Session - 03/28/17 0946    Visit Number 2   Number of Visits 16   Date for PT Re-Evaluation 04/22/17   Authorization Type [employer]    Authorization Time Period 03/25/17 to 05/20/17   PT Start Time 0945   PT Stop Time 1025   PT Time Calculation (min) 40 min   Activity Tolerance Patient tolerated treatment well   Behavior During Therapy [facility] for tasks assessed/performed      Past Medical History:  Diagnosis Date  . Adenomatous polyp of colon 03/2007  . Allergic rhinitis   . Anxiety   . Blood transfusion   . Bronchiectasis ([place])   . Depression   . Diabetes mellitus without complication ([place])   . Diverticulosis of colon (without mention of hemorrhage)   . DOE (dyspnea on exertion)   . Fatigue   . Fecal incontinence   . GERD (gastroesophageal reflux disease)   . Glaucoma   . Goiter   . Heart murmur   . Hyperlipidemia   . Hypertension   . Hypothyroidism   . IBS (irritable bowel syndrome)   . Migraine   . Mitral valve prolapse   . Neck mass   . Nonspecific abnormal electrocardiogram (ECG) (EKG)   . Palpitations   . Polyp, sigmoid colon   . Pulmonary nodules   . Recurrent UTI   . Tremor     Past Surgical History:  Procedure Laterality Date  . ABDOMINAL HYSTERECTOMY    . CARDIOVASCULAR STRESS TEST  09/19/2011   No scintigraphic evidence of inducible myocardial ischemia. Pharmacological stress test without chest pain or EKG changes for ischemia.  . COLONOSCOPY    . ESOPHAGOGASTRODUODENOSCOPY    . MASTOIDECTOMY     rt ear  . OOPHORECTOMY    . TOTAL HIP ARTHROPLASTY  5/10   left  . TRANSTHORACIC ECHOCARDIOGRAM  09/19/2011   EF  >52%, stage 1 diastolic dysfunction, mild tricuspid valve regurg    There were no vitals filed for this visit.      Subjective Assessment - 03/28/17 0947    Subjective Pt states that she is a little sore from doing her exercises. "I think I'm doing them right."   Pertinent History HTN, osteoporosis, arthritis   Limitations Lifting;House hold activities   How long can you sit comfortably? no issues   How long can you stand comfortably? no issues   How long can you walk comfortably? no issues   Patient Stated Goals to get some relief   Currently in Pain? Yes   Pain Score 6    Pain Location Neck   Pain Orientation Left   Pain Descriptors / Indicators Sharp   Pain Type Chronic pain   Pain Onset More than a month ago   Pain Frequency Intermittent   Aggravating Factors  moving it in all directions, lifting objects   Pain Relieving Factors rest, pain med   Effect of Pain on Daily Activities increases   Multiple Pain Sites No              [facility] Adult PT Treatment/Exercise - 03/28/17 0001      Manual Therapy   Manual Therapy Soft tissue  mobilization;Other (comment)   Manual therapy comments completed separate rest of treatment   Soft tissue mobilization efflurage and cross friction to bil UT, levator scap, and SCM; pt with significantly more restrictions on L with increased tenderness to palpation   Other Manual Therapy suboccipital release x 8 mins total; manual UT and levator scap stretches in supine 1 x 15 sec each                PT Education - 03/28/17 1027    Education provided Yes   Education Details reviewed goals, continue HEP, next few sessions will be manual heavy to help decrease restrictions and improve ROM.   Person(s) Educated Patient   Methods Explanation   Comprehension Verbalized understanding          PT Short Term Goals - 03/25/17 1145      PT SHORT TERM GOAL #1   Title Pt will be independent with HEP and perform consistently to maximize  recovery and decrease pain overall.    Time 2   Period Weeks   Status New     PT SHORT TERM GOAL #2   Title Pt will be able to attain and maintain proper sitting posture 50% of the time with minimal to no cueing from PT to decrease pain and maximize overall function.   Time 4   Period Weeks   Status New     PT SHORT TERM GOAL #3   Title Pt will demonstrate improved overall BUE strength to 5/5 with no pain to demonstrate improved overall function and decreased pain.   Time 4   Period Weeks   Status New           PT Long Term Goals - 03/25/17 1147      PT LONG TERM GOAL #1   Title Pt will have improved overall neck AROM by 15deg or > with no pain to maximize her driving ability and demonstrate improved overall function.   Time 8   Period Weeks   Status New     PT LONG TERM GOAL #2   Title Pt will report being able to sleep through the night with no awakenings due to pain to maximize recovery and demonstrate improved overall function.   Time 8   Period Weeks   Status New     PT LONG TERM GOAL #3   Title Pt will report being able to lift a gallon of milk with her L hand without neck pain to maximize function at home.   Time 8   Period Weeks   Status New     PT LONG TERM GOAL #4   Title Pt will have minimal to no muscle spasms in her cervical paraspinals, suboccipitals, UT, levator scap and periscapular musculature to maximize AROM and decrease pain overall.   Time 8   Period Weeks   Status New               Plan - 03/28/17 1027    Clinical Impression Statement Reviewed goals from inital eval with pt and she did not have any follow-up questions. Manual therapy was focus of todays session in order to decrease soft tissue restrictions and pain. Began session with suboccipital release which pt reported was tender but felt like a good stretch. Followed up with manual techniques to pt's UT, levator scap, and SCMs, with significant increased soft tissue restrictions on  the L compared to the R. Performed manual UT and levator scap stretches at EOS. Pt  verbalized that she felt better at EOS but still rated her pain at a 5/10. Continue to focus on manual therapy in next few sessions to decrease restrictions and improve pain.   Rehab Potential Good   PT Frequency 2x / week   PT Duration 8 weeks   PT Treatment/Interventions ADLs/Self Care Home Management;Moist Heat;Therapeutic activities;Therapeutic exercise;Neuromuscular re-education;Patient/family education;Manual techniques;Passive range of motion;Dry needling;Taping   PT Next Visit Plan manual therapy to cervical and periscapular musculature, serratus strengthening, postural strengthening; educate to sleep with towel roll in pillow case; Update HEP next session   PT Home Exercise Plan eval: supine cervical retraction, standing scap retraction with RTB, sitting with lumbar roll   Consulted and Agree with Plan of Care Patient      Patient will benefit from skilled therapeutic intervention in order to improve the following deficits and impairments:  Decreased range of motion, Decreased strength, Increased muscle spasms, Impaired flexibility, Improper body mechanics, Postural dysfunction, Pain  Visit Diagnosis: Cervicalgia  Abnormal posture  Muscle weakness (generalized)     Problem List Patient Active Problem List   Diagnosis Date Noted  . Hyperlipidemia LDL goal <70 08/16/2015  . Left ventricular diastolic dysfunction, NYHA class 1 03/13/2014  . Chest pain 01/30/2014  . Tremor, essential 05/07/2013  . Fecal incontinence 09/30/2012  . Pulmonary nodules 05/15/2012  . Dyspnea 05/27/2011  . Bronchiectasis without acute exacerbation ([place]) 09/26/2009  . OSTEOPENIA 03/17/2009  . COLONIC POLYPS, ADENOMATOUS, HX OF 09/27/2008  . BACK PAIN 09/01/2008  . LIPOMA 06/01/2008  . Mitral valve disorder 01/12/2008  . UTI'S, RECURRENT 06/10/2007  . Hypothyroidism 01/07/2007  . HYPERLIPIDEMIA 01/07/2007  .  ANXIETY 01/07/2007  . DEPRESSION 01/07/2007  . MIGRAINE HEADACHE 01/07/2007  . GLAUCOMA NEC 01/07/2007  . DECREASED HEARING 01/07/2007  . Essential hypertension 01/07/2007  . Allergic rhinitis 01/07/2007  . GERD 01/07/2007  . IBS 01/07/2007  . OSTEOARTHRITIS 01/07/2007     Geraldine Solar PT, DPT   [place] [address] [place], [place], [address] Phone: [phone]   Fax:  [phone]  Name: MARLAINE AREY MRN: [id number] Date of Birth: 1935-05-01

## 2017-04-01 ENCOUNTER — Ambulatory Visit (HOSPITAL_COMMUNITY): Payer: PPO

## 2017-04-01 ENCOUNTER — Encounter (HOSPITAL_COMMUNITY): Payer: Self-pay

## 2017-04-01 DIAGNOSIS — R293 Abnormal posture: Secondary | ICD-10-CM

## 2017-04-01 DIAGNOSIS — M542 Cervicalgia: Secondary | ICD-10-CM | POA: Diagnosis not present

## 2017-04-01 DIAGNOSIS — M6281 Muscle weakness (generalized): Secondary | ICD-10-CM

## 2017-04-01 NOTE — Therapy (Signed)
[place], [place], [address] Phone: [phone]   Fax:  [phone]  Physical Therapy Treatment  Patient Details  Name: Christy Richardson MRN: [id number] Date of Birth: 02/12/1935 Referring Provider: Laroy Apple, MD  Encounter Date: 04/01/2017      PT End of Session - 04/01/17 1201    Visit Number 3   Number of Visits 16   Date for PT Re-Evaluation 04/22/17   Authorization Type [employer]    Authorization Time Period 03/25/17 to 05/20/17   PT Start Time 0950   PT Stop Time 1030   PT Time Calculation (min) 40 min   Activity Tolerance Patient tolerated treatment well   Behavior During Therapy [facility] for tasks assessed/performed      Past Medical History:  Diagnosis Date  . Adenomatous polyp of colon 03/2007  . Allergic rhinitis   . Anxiety   . Blood transfusion   . Bronchiectasis ([place])   . Depression   . Diabetes mellitus without complication ([place])   . Diverticulosis of colon (without mention of hemorrhage)   . DOE (dyspnea on exertion)   . Fatigue   . Fecal incontinence   . GERD (gastroesophageal reflux disease)   . Glaucoma   . Goiter   . Heart murmur   . Hyperlipidemia   . Hypertension   . Hypothyroidism   . IBS (irritable bowel syndrome)   . Migraine   . Mitral valve prolapse   . Neck mass   . Nonspecific abnormal electrocardiogram (ECG) (EKG)   . Palpitations   . Polyp, sigmoid colon   . Pulmonary nodules   . Recurrent UTI   . Tremor     Past Surgical History:  Procedure Laterality Date  . ABDOMINAL HYSTERECTOMY    . CARDIOVASCULAR STRESS TEST  09/19/2011   No scintigraphic evidence of inducible myocardial ischemia. Pharmacological stress test without chest pain or EKG changes for ischemia.  . COLONOSCOPY    . ESOPHAGOGASTRODUODENOSCOPY    . MASTOIDECTOMY     rt ear  . OOPHORECTOMY    . TOTAL HIP ARTHROPLASTY  5/10   left  . TRANSTHORACIC ECHOCARDIOGRAM  09/19/2011   EF  >46%, stage 1 diastolic dysfunction, mild tricuspid valve regurg    There were no vitals filed for this visit.      Subjective Assessment - 04/01/17 0951    Subjective Pt reports that her neck is pretty tight right now. She reports being compliant with her exercises over the weekend.   Pertinent History HTN, osteoporosis, arthritis   Limitations Lifting;House hold activities   How long can you sit comfortably? no issues   How long can you stand comfortably? no issues   How long can you walk comfortably? no issues   Patient Stated Goals to get some relief   Currently in Pain? Yes   Pain Score 6    Pain Location Neck   Pain Orientation Left   Pain Descriptors / Indicators Sharp;Tightness   Pain Type Chronic pain   Pain Onset More than a month ago   Pain Frequency Intermittent   Aggravating Factors  moving it in all directions, lifting objects   Pain Relieving Factors rest, pain meds   Effect of Pain on Daily Activities increases   Multiple Pain Sites No          [facility] Adult PT Treatment/Exercise - 04/01/17 0001      Exercises   Exercises Neck     Neck  Exercises: Seated   Cervical Rotation Both;5 reps   Cervical Rotation Limitations with towel; max cueing for proper technique   Other Seated Exercise UT stretch in chair, 3x30 seconds each     Manual Therapy   Manual Therapy Soft tissue mobilization   Manual therapy comments completed separate rest of treatment   Soft tissue mobilization cross friction, efflurage, and trigger point ischemic release of bil UT, levator scap, and upper thoracic paraspinals, L>R, x 28 mins total            PT Education - 04/01/17 1201    Education provided Yes   Education Details updated HEP, exercise technique   Person(s) Educated Patient   Methods Explanation;Demonstration;Handout   Comprehension Verbalized understanding;Need further instruction          PT Short Term Goals - 03/25/17 1145      PT SHORT TERM GOAL #1   Title  Pt will be independent with HEP and perform consistently to maximize recovery and decrease pain overall.    Time 2   Period Weeks   Status New     PT SHORT TERM GOAL #2   Title Pt will be able to attain and maintain proper sitting posture 50% of the time with minimal to no cueing from PT to decrease pain and maximize overall function.   Time 4   Period Weeks   Status New     PT SHORT TERM GOAL #3   Title Pt will demonstrate improved overall BUE strength to 5/5 with no pain to demonstrate improved overall function and decreased pain.   Time 4   Period Weeks   Status New           PT Long Term Goals - 03/25/17 1147      PT LONG TERM GOAL #1   Title Pt will have improved overall neck AROM by 15deg or > with no pain to maximize her driving ability and demonstrate improved overall function.   Time 8   Period Weeks   Status New     PT LONG TERM GOAL #2   Title Pt will report being able to sleep through the night with no awakenings due to pain to maximize recovery and demonstrate improved overall function.   Time 8   Period Weeks   Status New     PT LONG TERM GOAL #3   Title Pt will report being able to lift a gallon of milk with her L hand without neck pain to maximize function at home.   Time 8   Period Weeks   Status New     PT LONG TERM GOAL #4   Title Pt will have minimal to no muscle spasms in her cervical paraspinals, suboccipitals, UT, levator scap and periscapular musculature to maximize AROM and decrease pain overall.   Time 8   Period Weeks   Status New               Plan - 04/01/17 1201    Clinical Impression Statement Manual therapy was again the focus of this session. Pt able to tolerate lying prone this date so PT was able to perform soft tissue techniques to her UT, levator scap, upper thoracic paraspinals, and periscapular musculature better this date. Pt continues to have significant soft tissue restrictions in these areas and was tender to  palpation throughout, L>R. Pt tolerated manual therapy well and did report some relief with movement following. Performed seated cervical rotation with towel and UT stretch  at EOS and added to HEP.   Rehab Potential Good   PT Frequency 2x / week   PT Duration 8 weeks   PT Treatment/Interventions ADLs/Self Care Home Management;Moist Heat;Therapeutic activities;Therapeutic exercise;Neuromuscular re-education;Patient/family education;Manual techniques;Passive range of motion;Dry needling;Taping   PT Next Visit Plan manual therapy to cervical and periscapular musculature, serratus strengthening, postural strengthening; educate to sleep with towel roll in pillow case**, attempt cervical retractions in sitting   PT Home Exercise Plan eval: supine cervical retraction, standing scap retraction with RTB, sitting with lumbar roll; 4/30: cervical rotation with towel, seated UT stretch   Consulted and Agree with Plan of Care Patient      Patient will benefit from skilled therapeutic intervention in order to improve the following deficits and impairments:  Decreased range of motion, Decreased strength, Increased muscle spasms, Impaired flexibility, Improper body mechanics, Postural dysfunction, Pain  Visit Diagnosis: Cervicalgia  Abnormal posture  Muscle weakness (generalized)     Problem List Patient Active Problem List   Diagnosis Date Noted  . Hyperlipidemia LDL goal <70 08/16/2015  . Left ventricular diastolic dysfunction, NYHA class 1 03/13/2014  . Chest pain 01/30/2014  . Tremor, essential 05/07/2013  . Fecal incontinence 09/30/2012  . Pulmonary nodules 05/15/2012  . Dyspnea 05/27/2011  . Bronchiectasis without acute exacerbation ([place]) 09/26/2009  . OSTEOPENIA 03/17/2009  . COLONIC POLYPS, ADENOMATOUS, HX OF 09/27/2008  . BACK PAIN 09/01/2008  . LIPOMA 06/01/2008  . Mitral valve disorder 01/12/2008  . UTI'S, RECURRENT 06/10/2007  . Hypothyroidism 01/07/2007  . HYPERLIPIDEMIA  01/07/2007  . ANXIETY 01/07/2007  . DEPRESSION 01/07/2007  . MIGRAINE HEADACHE 01/07/2007  . GLAUCOMA NEC 01/07/2007  . DECREASED HEARING 01/07/2007  . Essential hypertension 01/07/2007  . Allergic rhinitis 01/07/2007  . GERD 01/07/2007  . IBS 01/07/2007  . OSTEOARTHRITIS 01/07/2007     Geraldine Solar PT, DPT   [place] [address] [place], [place], [address] Phone: [phone]   Fax:  [phone]  Name: Christy Richardson MRN: [id number] Date of Birth: [birth year]

## 2017-04-01 NOTE — Patient Instructions (Signed)
  UPPER TRAP STRETCH - HOLDING CHAIR  While sitting in a chair, hold the seat with one hand and bend your head towards the opposite side for a gentle stretch to the side of the neck. Make sure that your nose is staying straight ahead, do not let your head turn when you tilt it down towards your shoulder.  Perform 2x/day, 2-3 sets of 30 sec holds each.   CERVICAL TOWEL ROTATION STRETCH  Hold the ends of a small folded bath towel and wrap it around your head and neck as shown. Place the towel on your face so as to minimize placing pressure on your jaw. Pressure should be placed on the side of your face/cheek bone.   Use your bottom most arm to anchor the towel in place. Use your top most arm to pull the towel to cause a gentle rotational stretch in your neck. Hold, then return to starting position and repeat.   Perform 1x/day, 2-3 sets of 10 each direction.

## 2017-04-03 ENCOUNTER — Ambulatory Visit (HOSPITAL_COMMUNITY): Payer: PPO | Attending: Physical Medicine and Rehabilitation

## 2017-04-03 ENCOUNTER — Encounter (HOSPITAL_COMMUNITY): Payer: Self-pay

## 2017-04-03 DIAGNOSIS — R293 Abnormal posture: Secondary | ICD-10-CM | POA: Diagnosis not present

## 2017-04-03 DIAGNOSIS — M542 Cervicalgia: Secondary | ICD-10-CM | POA: Diagnosis not present

## 2017-04-03 DIAGNOSIS — M6281 Muscle weakness (generalized): Secondary | ICD-10-CM | POA: Insufficient documentation

## 2017-04-03 NOTE — Therapy (Signed)
[place], [place], [address] Phone: [phone]   Fax:  [phone]  Physical Therapy Treatment  Patient Details  Name: Christy Richardson MRN: [id number] Date of Birth: 1935-03-30 Referring Provider: Laroy Apple, MD  Encounter Date: 04/03/2017      PT End of Session - 04/03/17 0945    Visit Number 4   Number of Visits 16   Date for PT Re-Evaluation 04/22/17   Authorization Type [employer]    Authorization Time Period 03/25/17 to 05/20/17   PT Start Time 0944   PT Stop Time 1028   PT Time Calculation (min) 44 min   Activity Tolerance Patient tolerated treatment well   Behavior During Therapy [facility] for tasks assessed/performed      Past Medical History:  Diagnosis Date  . Adenomatous polyp of colon 03/2007  . Allergic rhinitis   . Anxiety   . Blood transfusion   . Bronchiectasis ([place])   . Depression   . Diabetes mellitus without complication ([place])   . Diverticulosis of colon (without mention of hemorrhage)   . DOE (dyspnea on exertion)   . Fatigue   . Fecal incontinence   . GERD (gastroesophageal reflux disease)   . Glaucoma   . Goiter   . Heart murmur   . Hyperlipidemia   . Hypertension   . Hypothyroidism   . IBS (irritable bowel syndrome)   . Migraine   . Mitral valve prolapse   . Neck mass   . Nonspecific abnormal electrocardiogram (ECG) (EKG)   . Palpitations   . Polyp, sigmoid colon   . Pulmonary nodules   . Recurrent UTI   . Tremor     Past Surgical History:  Procedure Laterality Date  . ABDOMINAL HYSTERECTOMY    . CARDIOVASCULAR STRESS TEST  09/19/2011   No scintigraphic evidence of inducible myocardial ischemia. Pharmacological stress test without chest pain or EKG changes for ischemia.  . COLONOSCOPY    . ESOPHAGOGASTRODUODENOSCOPY    . MASTOIDECTOMY     rt ear  . OOPHORECTOMY    . TOTAL HIP ARTHROPLASTY  5/10   left  . TRANSTHORACIC ECHOCARDIOGRAM  09/19/2011   EF  >71%, stage 1 diastolic dysfunction, mild tricuspid valve regurg    There were no vitals filed for this visit.      Subjective Assessment - 04/03/17 0945    Subjective Pt states that she has been very sore since last treatment session. She states it has been really sore to move it.   Pertinent History HTN, osteoporosis, arthritis   Limitations Lifting;House hold activities   How long can you sit comfortably? no issues   How long can you stand comfortably? no issues   How long can you walk comfortably? no issues   Patient Stated Goals to get some relief   Currently in Pain? Yes   Pain Score 5    Pain Location Neck   Pain Orientation Left;Right   Pain Descriptors / Indicators Sharp;Tightness   Pain Type Chronic pain   Pain Onset More than a month ago   Pain Frequency Intermittent   Aggravating Factors  moving it in all directions, lifting objects   Pain Relieving Factors rest, pain meds   Effect of Pain on Daily Activities increases   Multiple Pain Sites No           [facility] Adult PT Treatment/Exercise - 04/03/17 0001      Neck Exercises: Seated   Neck Retraction  15 reps   Neck Retraction Limitations max verbal and tactile cueing   Other Seated Exercise UT stretch in chair, 3x15 seconds each     Neck Exercises: Supine   Cervical Isometrics Right lateral flexion;Left lateral flexion;Right rotation;Left rotation;5 secs;10 reps   Cervical Isometrics Limitations PT provided resistance; max cueing for proper technique; cues to maintain cervical retraction throughout     Manual Therapy   Manual Therapy Soft tissue mobilization   Manual therapy comments completed separate rest of treatment   Soft tissue mobilization efflurage and cross friction to bil SCMs x 12 mins total   Other Manual Therapy suboccipital release x 8 mins total; manual UT stretch in supine 3 x 15 sec each side           PT Education - 04/03/17 1030    Education provided Yes   Education Details  relaxation during manual, sleep with towel roll in pillow case to promote proper cervical alignment when sleeping   Person(s) Educated Patient   Methods Explanation;Demonstration;Handout   Comprehension Verbalized understanding;Returned demonstration          PT Short Term Goals - 03/25/17 1145      PT SHORT TERM GOAL #1   Title Pt will be independent with HEP and perform consistently to maximize recovery and decrease pain overall.    Time 2   Period Weeks   Status New     PT SHORT TERM GOAL #2   Title Pt will be able to attain and maintain proper sitting posture 50% of the time with minimal to no cueing from PT to decrease pain and maximize overall function.   Time 4   Period Weeks   Status New     PT SHORT TERM GOAL #3   Title Pt will demonstrate improved overall BUE strength to 5/5 with no pain to demonstrate improved overall function and decreased pain.   Time 4   Period Weeks   Status New           PT Long Term Goals - 03/25/17 1147      PT LONG TERM GOAL #1   Title Pt will have improved overall neck AROM by 15deg or > with no pain to maximize her driving ability and demonstrate improved overall function.   Time 8   Period Weeks   Status New     PT LONG TERM GOAL #2   Title Pt will report being able to sleep through the night with no awakenings due to pain to maximize recovery and demonstrate improved overall function.   Time 8   Period Weeks   Status New     PT LONG TERM GOAL #3   Title Pt will report being able to lift a gallon of milk with her L hand without neck pain to maximize function at home.   Time 8   Period Weeks   Status New     PT LONG TERM GOAL #4   Title Pt will have minimal to no muscle spasms in her cervical paraspinals, suboccipitals, UT, levator scap and periscapular musculature to maximize AROM and decrease pain overall.   Time 8   Period Weeks   Status New               Plan - 04/03/17 1125    Clinical Impression  Statement Pt presented to therapy with c/o increased pain following manual treatment during last session. PT did not perform manual on areas that were treated last session. Instead,  focused on stretching and manual therapy to SCMs as pt continues to c/o pain with rotation and referred pain into her head. Pt very guarded during session and required multiple verbal cues and encouragement to relax, but pt still had difficulty. Pt continues to verbalize that she doesn't think anything is going to help her pain. Pt will be coming 1x/week for the rest of her POC; sessions should begin to focus more on therex with manual therapy used to supplement.   Rehab Potential Good   PT Frequency 2x / week   PT Duration 8 weeks   PT Treatment/Interventions ADLs/Self Care Home Management;Moist Heat;Therapeutic activities;Therapeutic exercise;Neuromuscular re-education;Patient/family education;Manual techniques;Passive range of motion;Dry needling;Taping   PT Next Visit Plan manual therapy to cervical and periscapular musculature, beging serratus strengthening, postural strengthening; continue to perform cervical retractions in sitting as she had poor form this date; pec stretch in doorway, Y's on wall with lift off to promote improved thoracic ROM, ask how sleeping with the towel roll in pillow case went, bil low rows, self-isometrics in supine   PT Home Exercise Plan eval: supine cervical retraction, standing scap retraction with RTB, sitting with lumbar roll; 4/30: cervical rotation with towel, seated UT stretch   Consulted and Agree with Plan of Care Patient      Patient will benefit from skilled therapeutic intervention in order to improve the following deficits and impairments:  Decreased range of motion, Decreased strength, Increased muscle spasms, Impaired flexibility, Improper body mechanics, Postural dysfunction, Pain  Visit Diagnosis: Cervicalgia  Abnormal posture  Muscle weakness  (generalized)     Problem List Patient Active Problem List   Diagnosis Date Noted  . Hyperlipidemia LDL goal <70 08/16/2015  . Left ventricular diastolic dysfunction, NYHA class 1 03/13/2014  . Chest pain 01/30/2014  . Tremor, essential 05/07/2013  . Fecal incontinence 09/30/2012  . Pulmonary nodules 05/15/2012  . Dyspnea 05/27/2011  . Bronchiectasis without acute exacerbation ([place]) 09/26/2009  . OSTEOPENIA 03/17/2009  . COLONIC POLYPS, ADENOMATOUS, HX OF 09/27/2008  . BACK PAIN 09/01/2008  . LIPOMA 06/01/2008  . Mitral valve disorder 01/12/2008  . UTI'S, RECURRENT 06/10/2007  . Hypothyroidism 01/07/2007  . HYPERLIPIDEMIA 01/07/2007  . ANXIETY 01/07/2007  . DEPRESSION 01/07/2007  . MIGRAINE HEADACHE 01/07/2007  . GLAUCOMA NEC 01/07/2007  . DECREASED HEARING 01/07/2007  . Essential hypertension 01/07/2007  . Allergic rhinitis 01/07/2007  . GERD 01/07/2007  . IBS 01/07/2007  . OSTEOARTHRITIS 01/07/2007     Geraldine Solar PT, DPT   [place] [address] [place], [place], [address] Phone: [phone]   Fax:  [phone]  Name: Christy Richardson MRN: [id number] Date of Birth: 1935-03-22

## 2017-04-10 ENCOUNTER — Encounter (HOSPITAL_COMMUNITY): Payer: Self-pay

## 2017-04-10 ENCOUNTER — Ambulatory Visit (HOSPITAL_COMMUNITY): Payer: PPO

## 2017-04-10 DIAGNOSIS — M542 Cervicalgia: Secondary | ICD-10-CM

## 2017-04-10 DIAGNOSIS — M6281 Muscle weakness (generalized): Secondary | ICD-10-CM

## 2017-04-10 DIAGNOSIS — R293 Abnormal posture: Secondary | ICD-10-CM

## 2017-04-10 NOTE — Therapy (Signed)
[place], [place], [address] Phone: [phone]   Fax:  [phone]  Physical Therapy Treatment  Patient Details  Name: Christy Richardson MRN: [id number] Date of Birth: [birth year] Referring Provider: Laroy Apple, MD  Encounter Date: 04/10/2017      PT End of Session - 04/10/17 0953    Visit Number 5   Number of Visits 16   Date for PT Re-Evaluation 04/22/17   Authorization Type [employer]    Authorization Time Period 03/25/17 to 05/20/17   PT Start Time 0953   PT Stop Time 1035   PT Time Calculation (min) 42 min   Activity Tolerance Patient tolerated treatment well   Behavior During Therapy [facility] for tasks assessed/performed      Past Medical History:  Diagnosis Date  . Adenomatous polyp of colon 03/2007  . Allergic rhinitis   . Anxiety   . Blood transfusion   . Bronchiectasis ([place])   . Depression   . Diabetes mellitus without complication ([place])   . Diverticulosis of colon (without mention of hemorrhage)   . DOE (dyspnea on exertion)   . Fatigue   . Fecal incontinence   . GERD (gastroesophageal reflux disease)   . Glaucoma   . Goiter   . Heart murmur   . Hyperlipidemia   . Hypertension   . Hypothyroidism   . IBS (irritable bowel syndrome)   . Migraine   . Mitral valve prolapse   . Neck mass   . Nonspecific abnormal electrocardiogram (ECG) (EKG)   . Palpitations   . Polyp, sigmoid colon   . Pulmonary nodules   . Recurrent UTI   . Tremor     Past Surgical History:  Procedure Laterality Date  . ABDOMINAL HYSTERECTOMY    . CARDIOVASCULAR STRESS TEST  09/19/2011   No scintigraphic evidence of inducible myocardial ischemia. Pharmacological stress test without chest pain or EKG changes for ischemia.  . COLONOSCOPY    . ESOPHAGOGASTRODUODENOSCOPY    . MASTOIDECTOMY     rt ear  . OOPHORECTOMY    . TOTAL HIP ARTHROPLASTY  5/10   left  . TRANSTHORACIC ECHOCARDIOGRAM  09/19/2011   EF  >96%, stage 1 diastolic dysfunction, mild tricuspid valve regurg    There were no vitals filed for this visit.      Subjective Assessment - 04/10/17 0953    Subjective Pt states that her neck is still giving her some pain. She didnt' take her muscle relaxer for a few days and she can tell. She is trying to do her exercises at home.   Pertinent History HTN, osteoporosis, arthritis   Limitations Lifting;House hold activities   How long can you sit comfortably? no issues   How long can you stand comfortably? no issues   How long can you walk comfortably? no issues   Patient Stated Goals to get some relief   Currently in Pain? Yes   Pain Score 5    Pain Location Neck   Pain Orientation Right;Left   Pain Descriptors / Indicators Sharp;Tightness   Pain Type Chronic pain   Pain Onset More than a month ago   Pain Frequency Intermittent   Aggravating Factors  moving it in all directions, lifting objects   Pain Relieving Factors rest, pain meds   Effect of Pain on Daily Activities increases   Multiple Pain Sites No            [facility] Adult PT Treatment/Exercise - 04/10/17  0001      Neck Exercises: Standing   Other Standing Exercises Y's with liftoff x 10 reps, max cues for proper technique; bil low rows with RTB 2x10 with max cueing   Other Standing Exercises thoracic rotation in sitting  5 x 10 sec reps each side, max cueing proper technique; bil scap retraction with RTB 2x10     Neck Exercises: Seated   Neck Retraction 15 reps   Neck Retraction Limitations max verbal and tactile cueing     Neck Exercises: Stretches   Upper Trapezius Stretch 5 reps;30 seconds  seated while heat pack applied, cues for proper technique   Corner Stretch 3 reps;10 seconds  x 1 in doorway but pt had increased pain so changed to corne             PT Education - 04/10/17 1041    Education provided Yes   Education Details max verbal, visual, and tactile cues for proper exercise technique,  updated HEP to promote improved thoracic mobility   Person(s) Educated Patient   Methods Explanation;Demonstration;Handout   Comprehension Verbalized understanding;Returned demonstration;Verbal cues required;Tactile cues required;Need further instruction          PT Short Term Goals - 03/25/17 1145      PT SHORT TERM GOAL #1   Title Pt will be independent with HEP and perform consistently to maximize recovery and decrease pain overall.    Time 2   Period Weeks   Status New     PT SHORT TERM GOAL #2   Title Pt will be able to attain and maintain proper sitting posture 50% of the time with minimal to no cueing from PT to decrease pain and maximize overall function.   Time 4   Period Weeks   Status New     PT SHORT TERM GOAL #3   Title Pt will demonstrate improved overall BUE strength to 5/5 with no pain to demonstrate improved overall function and decreased pain.   Time 4   Period Weeks   Status New           PT Long Term Goals - 03/25/17 1147      PT LONG TERM GOAL #1   Title Pt will have improved overall neck AROM by 15deg or > with no pain to maximize her driving ability and demonstrate improved overall function.   Time 8   Period Weeks   Status New     PT LONG TERM GOAL #2   Title Pt will report being able to sleep through the night with no awakenings due to pain to maximize recovery and demonstrate improved overall function.   Time 8   Period Weeks   Status New     PT LONG TERM GOAL #3   Title Pt will report being able to lift a gallon of milk with her L hand without neck pain to maximize function at home.   Time 8   Period Weeks   Status New     PT LONG TERM GOAL #4   Title Pt will have minimal to no muscle spasms in her cervical paraspinals, suboccipitals, UT, levator scap and periscapular musculature to maximize AROM and decrease pain overall.   Time 8   Period Weeks   Status New               Plan - 04/10/17 1042    Clinical Impression  Statement Session focused on therex this date which pt tolerated okay. Performed UT stretching  with heat pack this date to promote relaxation and improved stretch; pt stated that it felt somewhat better with the heat applied. Pt demo'd decreased thoracic mobility throughout session so addressed this with mobility exercises but she had difficulty with the corner stretch, Y's with liftoff, and thoracic rotation in sitting as she required max cueing throughout for proper technique. Did not perform manual this date due to time constraints and because pt did not appear to respond as positively to manual as PT intially hypothesized. Continue with therex POC with manual STM PRN.   Rehab Potential Good   PT Frequency 2x / week   PT Duration 8 weeks   PT Treatment/Interventions ADLs/Self Care Home Management;Moist Heat;Therapeutic activities;Therapeutic exercise;Neuromuscular re-education;Patient/family education;Manual techniques;Passive range of motion;Dry needling;Taping   PT Next Visit Plan manual therapy to cervical and periscapular musculature PRN at EOS, continue postural strengthening; continue to perform cervical retractions in sitting as she had poor form again this date; continue pec stretch in doorway, Y's on wall with lift off to promote improved thoracic ROM, ask how sleeping with the towel roll in pillow case went, bil low rows; begin self-isometrics in supine; continue thoracic mobility exercises   PT Home Exercise Plan eval: supine cervical retraction, standing scap retraction with RTB, sitting with lumbar roll; 4/30: cervical rotation with towel, seated UT stretch; 5/9: seated thoracic rotation, y's on wall with liftoff   Consulted and Agree with Plan of Care Patient      Patient will benefit from skilled therapeutic intervention in order to improve the following deficits and impairments:  Decreased range of motion, Decreased strength, Increased muscle spasms, Impaired flexibility, Improper body  mechanics, Postural dysfunction, Pain  Visit Diagnosis: Cervicalgia  Abnormal posture  Muscle weakness (generalized)     Problem List Patient Active Problem List   Diagnosis Date Noted  . Hyperlipidemia LDL goal <70 08/16/2015  . Left ventricular diastolic dysfunction, NYHA class 1 03/13/2014  . Chest pain 01/30/2014  . Tremor, essential 05/07/2013  . Fecal incontinence 09/30/2012  . Pulmonary nodules 05/15/2012  . Dyspnea 05/27/2011  . Bronchiectasis without acute exacerbation ([place]) 09/26/2009  . OSTEOPENIA 03/17/2009  . COLONIC POLYPS, ADENOMATOUS, HX OF 09/27/2008  . BACK PAIN 09/01/2008  . LIPOMA 06/01/2008  . Mitral valve disorder 01/12/2008  . UTI'S, RECURRENT 06/10/2007  . Hypothyroidism 01/07/2007  . HYPERLIPIDEMIA 01/07/2007  . ANXIETY 01/07/2007  . DEPRESSION 01/07/2007  . MIGRAINE HEADACHE 01/07/2007  . GLAUCOMA NEC 01/07/2007  . DECREASED HEARING 01/07/2007  . Essential hypertension 01/07/2007  . Allergic rhinitis 01/07/2007  . GERD 01/07/2007  . IBS 01/07/2007  . OSTEOARTHRITIS 01/07/2007     Geraldine Solar PT, DPT   [place] [address] [place], [place], [address] Phone: [phone]   Fax:  [phone]  Name: BARI LEIB MRN: [id number] Date of Birth: Sep 07, 1935

## 2017-04-10 NOTE — Patient Instructions (Signed)
  Thoracic Matrix Rotation Right  In seated position, patient will move slowly through full, pain free range of motion. Full rotation of the trunk with scapular protraction  for mobility and stability of the spine.  Perform to both directions.  Do 1x/day, 5-10 reps, holding for 10-15 seconds to each side   wall slides shoulder flexion with lift off  place forearms on wall, shoulders at 90 degrees in front of you slowly slide arms up wall without moving your lower back, keeping your head level and neck relaxed only move arms as far as able to maintain posture and without pain, at the top of the motion, lift arms off wall, moving them backward by tilting shoulder blades back and down  Perform 2 sets of 10 reps, 1x/day

## 2017-04-17 ENCOUNTER — Encounter (HOSPITAL_COMMUNITY): Payer: Self-pay

## 2017-04-17 ENCOUNTER — Ambulatory Visit (HOSPITAL_COMMUNITY): Payer: PPO

## 2017-04-17 DIAGNOSIS — M6281 Muscle weakness (generalized): Secondary | ICD-10-CM

## 2017-04-17 DIAGNOSIS — R293 Abnormal posture: Secondary | ICD-10-CM

## 2017-04-17 DIAGNOSIS — M542 Cervicalgia: Secondary | ICD-10-CM | POA: Diagnosis not present

## 2017-04-17 NOTE — Therapy (Signed)
[place], [place], [address] Phone: [phone]   Fax:  [phone]  Physical Therapy Treatment  Patient Details  Name: Christy Richardson MRN: [id number] Date of Birth: [birth year] Referring Provider: Laroy Apple, MD  Encounter Date: 04/17/2017      PT End of Session - 04/17/17 0951    Visit Number 6   Number of Visits 16   Date for PT Re-Evaluation 04/22/17   Authorization Type [employer]    Authorization Time Period 03/25/17 to 05/20/17   PT Start Time 0948   Activity Tolerance Patient tolerated treatment well   Behavior During Therapy [facility] for tasks assessed/performed      Past Medical History:  Diagnosis Date  . Adenomatous polyp of colon 03/2007  . Allergic rhinitis   . Anxiety   . Blood transfusion   . Bronchiectasis ([place])   . Depression   . Diabetes mellitus without complication ([place])   . Diverticulosis of colon (without mention of hemorrhage)   . DOE (dyspnea on exertion)   . Fatigue   . Fecal incontinence   . GERD (gastroesophageal reflux disease)   . Glaucoma   . Goiter   . Heart murmur   . Hyperlipidemia   . Hypertension   . Hypothyroidism   . IBS (irritable bowel syndrome)   . Migraine   . Mitral valve prolapse   . Neck mass   . Nonspecific abnormal electrocardiogram (ECG) (EKG)   . Palpitations   . Polyp, sigmoid colon   . Pulmonary nodules   . Recurrent UTI   . Tremor     Past Surgical History:  Procedure Laterality Date  . ABDOMINAL HYSTERECTOMY    . CARDIOVASCULAR STRESS TEST  09/19/2011   No scintigraphic evidence of inducible myocardial ischemia. Pharmacological stress test without chest pain or EKG changes for ischemia.  . COLONOSCOPY    . ESOPHAGOGASTRODUODENOSCOPY    . MASTOIDECTOMY     rt ear  . OOPHORECTOMY    . TOTAL HIP ARTHROPLASTY  5/10   left  . TRANSTHORACIC ECHOCARDIOGRAM  09/19/2011   EF >31%, stage 1 diastolic dysfunction, mild tricuspid valve  regurg    There were no vitals filed for this visit.      Subjective Assessment - 04/17/17 0949    Subjective Pt states that her neck is still killing her. She still has pain with all movement, especially with looking up.   Pertinent History HTN, osteoporosis, arthritis   Limitations Lifting;House hold activities   How long can you sit comfortably? no issues   How long can you stand comfortably? no issues   How long can you walk comfortably? no issues   Patient Stated Goals to get some relief   Currently in Pain? Yes   Pain Score 5    Pain Location Neck   Pain Orientation Right;Left   Pain Descriptors / Indicators Sharp;Tightness   Pain Type Chronic pain   Pain Onset More than a month ago   Pain Frequency Intermittent   Aggravating Factors  movign it in all directions, lifting objects   Pain Relieving Factors rest, pain meds   Effect of Pain on Daily Activities increases   Multiple Pain Sites No                         [facility] Adult PT Treatment/Exercise - 04/17/17 0001      Neck Exercises: Theraband   Shoulder Extension 15 reps;Red  Shoulder Extension Limitations cues for proper technique   Rows 15 reps;Red   Rows Limitations high rows, cues for proper technique      Neck Exercises: Seated   Other Seated Exercise seated thoracic rotation (with lumbar roll for posture) in sitting 10 reps x 5 sec each way; seated thoracic ext over towel roll x 15 reps throughout thoracic spine     Neck Exercises: Supine   Cervical Isometrics Right lateral flexion;Left lateral flexion;Right rotation;Left rotation;5 secs;10 reps   Cervical Isometrics Limitations PT provided resistance, pt had pain throughout     Manual Therapy   Manual Therapy Soft tissue mobilization   Manual therapy comments completed separate rest of treatment   Other Manual Therapy suboccipital release and manual traction x 12 mins total     Neck Exercises: Stretches   Corner Stretch 5 reps;10  seconds  cueing for proper technique and form                PT Education - 04/17/17 1029    Education provided Yes   Education Details exercise technique, will reassess next session    Person(s) Educated Patient   Methods Explanation;Demonstration   Comprehension Verbalized understanding;Returned demonstration          PT Short Term Goals - 03/25/17 1145      PT SHORT TERM GOAL #1   Title Pt will be independent with HEP and perform consistently to maximize recovery and decrease pain overall.    Time 2   Period Weeks   Status New     PT SHORT TERM GOAL #2   Title Pt will be able to attain and maintain proper sitting posture 50% of the time with minimal to no cueing from PT to decrease pain and maximize overall function.   Time 4   Period Weeks   Status New     PT SHORT TERM GOAL #3   Title Pt will demonstrate improved overall BUE strength to 5/5 with no pain to demonstrate improved overall function and decreased pain.   Time 4   Period Weeks   Status New           PT Long Term Goals - 03/25/17 1147      PT LONG TERM GOAL #1   Title Pt will have improved overall neck AROM by 15deg or > with no pain to maximize her driving ability and demonstrate improved overall function.   Time 8   Period Weeks   Status New     PT LONG TERM GOAL #2   Title Pt will report being able to sleep through the night with no awakenings due to pain to maximize recovery and demonstrate improved overall function.   Time 8   Period Weeks   Status New     PT LONG TERM GOAL #3   Title Pt will report being able to lift a gallon of milk with her L hand without neck pain to maximize function at home.   Time 8   Period Weeks   Status New     PT LONG TERM GOAL #4   Title Pt will have minimal to no muscle spasms in her cervical paraspinals, suboccipitals, UT, levator scap and periscapular musculature to maximize AROM and decrease pain overall.   Time 8   Period Weeks   Status New                Plan - 04/17/17 1031    Clinical Impression Statement Pt presents  to therapy with continued c/o neck pain with movement. She reports being compliant with her HEP but states she doesn't know if it's doing more harm than good. Pt verbalized during session that she thinks this is just something she is going  to have to live with and she doesn't think it is goint to ever get better. PT inquired as to why she felt that way and pt stated that this isjust how her bones grew and she's just going to have to deal with it. Pt stated that she may not come to many more sessions due to high co-pay; PT educated pt that she was due for reassessment next session and asked if she would prefer to do it today and she stated no that she would come next week. Reassess next session and determine POC.   Rehab Potential Good   PT Frequency 2x / week   PT Duration 8 weeks   PT Treatment/Interventions ADLs/Self Care Home Management;Moist Heat;Therapeutic activities;Therapeutic exercise;Neuromuscular re-education;Patient/family education;Manual techniques;Passive range of motion;Dry needling;Taping   PT Next Visit Plan reassess next visit. manual therapy to cervical and periscapular musculature PRN at EOS, continue postural strengthening; continue to perform cervical retractions in sitting as she had poor form again this date; continue pec stretch in doorway, Y's on wall with lift off to promote improved thoracic ROM, ask how sleeping with the towel roll in pillow case went, bil low rows; begin self-isometrics in supine; continue thoracic mobility exercises   PT Home Exercise Plan eval: supine cervical retraction, standing scap retraction with RTB, sitting with lumbar roll; 4/30: cervical rotation with towel, seated UT stretch; 5/9: seated thoracic rotation, y's on wall with liftoff   Consulted and Agree with Plan of Care Patient      Patient will benefit from skilled therapeutic intervention in order to  improve the following deficits and impairments:  Decreased range of motion, Decreased strength, Increased muscle spasms, Impaired flexibility, Improper body mechanics, Postural dysfunction, Pain  Visit Diagnosis: Cervicalgia  Abnormal posture  Muscle weakness (generalized)     Problem List Patient Active Problem List   Diagnosis Date Noted  . Hyperlipidemia LDL goal <70 08/16/2015  . Left ventricular diastolic dysfunction, NYHA class 1 03/13/2014  . Chest pain 01/30/2014  . Tremor, essential 05/07/2013  . Fecal incontinence 09/30/2012  . Pulmonary nodules 05/15/2012  . Dyspnea 05/27/2011  . Bronchiectasis without acute exacerbation ([place]) 09/26/2009  . OSTEOPENIA 03/17/2009  . COLONIC POLYPS, ADENOMATOUS, HX OF 09/27/2008  . BACK PAIN 09/01/2008  . LIPOMA 06/01/2008  . Mitral valve disorder 01/12/2008  . UTI'S, RECURRENT 06/10/2007  . Hypothyroidism 01/07/2007  . HYPERLIPIDEMIA 01/07/2007  . ANXIETY 01/07/2007  . DEPRESSION 01/07/2007  . MIGRAINE HEADACHE 01/07/2007  . GLAUCOMA NEC 01/07/2007  . DECREASED HEARING 01/07/2007  . Essential hypertension 01/07/2007  . Allergic rhinitis 01/07/2007  . GERD 01/07/2007  . IBS 01/07/2007  . OSTEOARTHRITIS 01/07/2007     Geraldine Solar PT, DPT   [place] [address] [place], [place], [address] Phone: [phone]   Fax:  [phone]  Name: NARYA BEAVIN MRN: [id number] Date of Birth: 1935-09-23

## 2017-04-23 ENCOUNTER — Encounter (HOSPITAL_COMMUNITY): Payer: Self-pay

## 2017-04-23 DIAGNOSIS — M542 Cervicalgia: Secondary | ICD-10-CM | POA: Diagnosis not present

## 2017-04-23 NOTE — Therapy (Signed)
[place] [facility] [address] [place], [place], [address] Phone: [phone]   Fax:  [phone]  Patient Details  Name: Christy Richardson MRN: [id number] Date of Birth: 05/11/1935 Referring Provider:  No ref. provider found  Encounter Date: 04/23/2017   PHYSICAL THERAPY DISCHARGE SUMMARY  Visits from Start of Care: 6  Current functional level related to goals / functional outcomes: See last treatment note for current functional level. Pt called today and requested to be dc'd as she felt like she was not going to get any better and could do the exercises at home.    Remaining deficits: See last treatment note.   Education / Equipment: n/a Plan: Patient agrees to discharge.  Patient goals were not met. Patient is being discharged due to the patient's request.  ?????       Brooke Powell PT, DPT   [facility] [address] , [place], [address] Phone: [phone]   Fax:  [phone] 

## 2017-04-24 ENCOUNTER — Ambulatory Visit (HOSPITAL_COMMUNITY): Payer: PPO | Admitting: Physical Therapy

## 2017-05-01 ENCOUNTER — Ambulatory Visit (HOSPITAL_COMMUNITY): Payer: PPO

## 2017-05-08 ENCOUNTER — Encounter (HOSPITAL_COMMUNITY): Payer: Self-pay

## 2017-05-09 DIAGNOSIS — H401131 Primary open-angle glaucoma, bilateral, mild stage: Secondary | ICD-10-CM | POA: Diagnosis not present

## 2017-05-15 ENCOUNTER — Encounter (HOSPITAL_COMMUNITY): Payer: Self-pay

## 2017-06-19 ENCOUNTER — Other Ambulatory Visit: Payer: Self-pay | Admitting: Cardiovascular Disease

## 2017-07-17 ENCOUNTER — Other Ambulatory Visit (HOSPITAL_COMMUNITY): Payer: Self-pay | Admitting: Internal Medicine

## 2017-07-17 DIAGNOSIS — Z1231 Encounter for screening mammogram for malignant neoplasm of breast: Secondary | ICD-10-CM

## 2017-08-07 ENCOUNTER — Ambulatory Visit (HOSPITAL_COMMUNITY)
Admission: RE | Admit: 2017-08-07 | Discharge: 2017-08-07 | Disposition: A | Discharge status: Home / Self Care | Payer: PPO | Source: Ambulatory Visit | Attending: Internal Medicine | Admitting: Internal Medicine

## 2017-08-07 DIAGNOSIS — Z1231 Encounter for screening mammogram for malignant neoplasm of breast: Secondary | ICD-10-CM | POA: Diagnosis not present

## 2017-08-15 DIAGNOSIS — R7301 Impaired fasting glucose: Secondary | ICD-10-CM | POA: Diagnosis not present

## 2017-08-15 DIAGNOSIS — I10 Essential (primary) hypertension: Secondary | ICD-10-CM | POA: Diagnosis not present

## 2017-08-15 DIAGNOSIS — E039 Hypothyroidism, unspecified: Secondary | ICD-10-CM | POA: Diagnosis not present

## 2017-09-04 DIAGNOSIS — R490 Dysphonia: Secondary | ICD-10-CM | POA: Diagnosis not present

## 2017-09-04 DIAGNOSIS — K219 Gastro-esophageal reflux disease without esophagitis: Secondary | ICD-10-CM | POA: Diagnosis not present

## 2017-09-04 DIAGNOSIS — I10 Essential (primary) hypertension: Secondary | ICD-10-CM | POA: Diagnosis not present

## 2017-09-04 DIAGNOSIS — J479 Bronchiectasis, uncomplicated: Secondary | ICD-10-CM | POA: Diagnosis not present

## 2017-09-04 DIAGNOSIS — Z6828 Body mass index (BMI) 28.0-28.9, adult: Secondary | ICD-10-CM | POA: Diagnosis not present

## 2017-09-04 DIAGNOSIS — J069 Acute upper respiratory infection, unspecified: Secondary | ICD-10-CM | POA: Diagnosis not present

## 2017-09-04 DIAGNOSIS — E039 Hypothyroidism, unspecified: Secondary | ICD-10-CM | POA: Diagnosis not present

## 2017-09-04 DIAGNOSIS — Z23 Encounter for immunization: Secondary | ICD-10-CM | POA: Diagnosis not present

## 2017-09-04 DIAGNOSIS — M4692 Unspecified inflammatory spondylopathy, cervical region: Secondary | ICD-10-CM | POA: Diagnosis not present

## 2017-09-04 DIAGNOSIS — E782 Mixed hyperlipidemia: Secondary | ICD-10-CM | POA: Diagnosis not present

## 2017-09-04 DIAGNOSIS — R7301 Impaired fasting glucose: Secondary | ICD-10-CM | POA: Diagnosis not present

## 2017-09-04 DIAGNOSIS — E875 Hyperkalemia: Secondary | ICD-10-CM | POA: Diagnosis not present

## 2017-09-04 DIAGNOSIS — M2578 Osteophyte, vertebrae: Secondary | ICD-10-CM | POA: Diagnosis not present

## 2017-09-05 DIAGNOSIS — H7012 Chronic mastoiditis, left ear: Secondary | ICD-10-CM | POA: Diagnosis not present

## 2017-09-05 DIAGNOSIS — H903 Sensorineural hearing loss, bilateral: Secondary | ICD-10-CM | POA: Diagnosis not present

## 2017-09-12 DIAGNOSIS — Z6826 Body mass index (BMI) 26.0-26.9, adult: Secondary | ICD-10-CM | POA: Diagnosis not present

## 2017-09-12 DIAGNOSIS — J209 Acute bronchitis, unspecified: Secondary | ICD-10-CM | POA: Diagnosis not present

## 2017-09-26 DIAGNOSIS — E039 Hypothyroidism, unspecified: Secondary | ICD-10-CM | POA: Diagnosis not present

## 2017-10-02 DIAGNOSIS — E039 Hypothyroidism, unspecified: Secondary | ICD-10-CM | POA: Diagnosis not present

## 2017-10-02 DIAGNOSIS — I10 Essential (primary) hypertension: Secondary | ICD-10-CM | POA: Diagnosis not present

## 2017-10-02 DIAGNOSIS — M542 Cervicalgia: Secondary | ICD-10-CM | POA: Diagnosis not present

## 2017-10-29 ENCOUNTER — Encounter: Payer: Self-pay | Admitting: Internal Medicine

## 2017-11-07 DIAGNOSIS — H401131 Primary open-angle glaucoma, bilateral, mild stage: Secondary | ICD-10-CM | POA: Diagnosis not present

## 2017-12-19 DIAGNOSIS — H401131 Primary open-angle glaucoma, bilateral, mild stage: Secondary | ICD-10-CM | POA: Diagnosis not present

## 2018-01-13 NOTE — Progress Notes (Deleted)
Cardiology Office Note  Date:  01/13/2018   ID:  Christy Richardson, DOB 08-Nov-1935, MRN [id number]  PCP:  Celene Squibb, MD   No chief complaint on file.   HPI:  Christy Richardson a 83 y.o.femaleWith a history of  labile hypertension,  mitral valve prolapse per the patient with no mitral valve regurgitation on echocardiogram 2015  palpitations,  bronchiectasis,  GERD, goirter, mixed hyperlipidemia,  hypertension with documented grade 1 diastolic dysfunction.  She presents today for routine follow-up of her labile blood pressure  In general she reports that she feels well with no complaints Denies any significant chest pain She has chronic shortness of breath on exertion which she attributes to her lung disease  Blood pressure medication Regimen as below typically amlodipine 5 in Am, sometimes with losartan 50 only for high blood pressure)( Losartan  50 at 9 pm Metoprolol 50 at 9pm   Blood pressures were reviewed from the past several weeks showing systolic pressure ranging from 110 up to 160 On average 130 up to 140. These her morning blood pressures  Sometimes has Pain in her neck, chronic issue  continued leg weakness. No regular exercise  Total cholesterol 155 on lab work from 2017 with primary care  EKG on today's visit shows normal sinus rhythm with rate 75 bpm, no significant ST or T-wave changes  Other past medical history reviewed  In October 2012 a nuclear perfusion study revealed normal perfusion  echo Doppler study revealed normal systolic function with grade 1 diastolic dysfunction, mild aortic valve sclerosis without stenosis, trace AR, MR, and mild TR as well as PR. She had upper normal RV pressure 26 mm.  echo Doppler study on 02/02/2014. This showed an ejection fraction of 60-65%. She had grade 1 diastolic dysfunction. There was mild aortic sclerosis without stenosis.    lexiscan perfusion study on 02/04/2014 was normal without scar or ischemia. Post  stress ejection fraction was 73%.    CardioNet monitor to assess for palpitations. Unfortunately, she developed significant skin irritation, and only wore this for 9 days. This revealed episodes of sinus rhythm, but she did have episodes of sinus tachycardia with rates in the low 100s. There was there were no episodes of SVT or atrial fibrillation.   PMH:   has a past medical history of Adenomatous polyp of colon (03/2007), Allergic rhinitis, Anxiety, Blood transfusion, Bronchiectasis ([place]), Depression, Diabetes mellitus without complication ([place]), Diverticulosis of colon (without mention of hemorrhage), DOE (dyspnea on exertion), Fatigue, Fecal incontinence, GERD (gastroesophageal reflux disease), Glaucoma, Goiter, Heart murmur, Hyperlipidemia, Hypertension, Hypothyroidism, IBS (irritable bowel syndrome), Migraine, Mitral valve prolapse, Neck mass, Nonspecific abnormal electrocardiogram (ECG) (EKG), Palpitations, Polyp, sigmoid colon, Pulmonary nodules, Recurrent UTI, and Tremor.  PSH:    Past Surgical History:  Procedure Laterality Date  . ABDOMINAL HYSTERECTOMY    . CARDIOVASCULAR STRESS TEST  09/19/2011   No scintigraphic evidence of inducible myocardial ischemia. Pharmacological stress test without chest pain or EKG changes for ischemia.  . COLONOSCOPY    . ESOPHAGOGASTRODUODENOSCOPY    . MASTOIDECTOMY     rt ear  . OOPHORECTOMY    . TOTAL HIP ARTHROPLASTY  5/10   left  . TRANSTHORACIC ECHOCARDIOGRAM  09/19/2011   EF >46%, stage 1 diastolic dysfunction, mild tricuspid valve regurg    Current Outpatient Medications  Medication Sig Dispense Refill  . ADVAIR DISKUS 250-50 MCG/DOSE AEPB INHALE 1 PUFF TWICE DAILY. 60 each 5  . Alpha-D-Galactosidase (BEANO) TABS Take 1 tablet by mouth as  needed (for gas).     . ALPRAZolam (XANAX) 1 MG tablet Take 1 tablet (1 mg total) by mouth at bedtime as needed.    Marland Kitchen amLODipine (NORVASC) 5 MG tablet TAKE (1) TABLET BY MOUTH DAILY AFTER BREAKFAST.  90 tablet 2  . aspirin 81 MG tablet Take 81 mg by mouth daily.      . brimonidine (ALPHAGAN) 0.2 % ophthalmic solution Place 1 drop into both eyes 2 (two) times daily. Place 1 drop into both eyes 2 (two) times daily.    . cetirizine (ZYRTEC) 10 MG tablet Take 10 mg by mouth daily.     . cyclobenzaprine (FLEXERIL) 10 MG tablet Take 10 mg by mouth 3 (three) times daily as needed for muscle spasms.    . dorzolamide (TRUSOPT) 2 % ophthalmic solution Place 1 drop into both eyes 2 (two) times daily. Place 1 drop into both eyes 2 (two) times daily.    Marland Kitchen levothyroxine (SYNTHROID, LEVOTHROID) 50 MCG tablet Take 50 mcg by mouth daily before breakfast.    . LORazepam (ATIVAN) 0.5 MG tablet Take 0.5 mg by mouth 2 (two) times daily as needed (tremors).    . losartan (COZAAR) 100 MG tablet Take 50 mg by mouth 2 (two) times daily.    . meloxicam (MOBIC) 15 MG tablet Take 15 mg by mouth daily.    . metoprolol succinate (TOPROL-XL) 50 MG 24 hr tablet Take 1 tablet (50 mg total) by mouth daily. 90 tablet 3  . Multiple Vitamins-Minerals (MULTIVITAMIN WITH MINERALS) tablet Take 1 tablet by mouth daily.      . pantoprazole (PROTONIX) 40 MG tablet Take 40 mg by mouth daily.     . pravastatin (PRAVACHOL) 40 MG tablet Take 1 tablet (40 mg total) by mouth at bedtime.    Marland Kitchen Respiratory Therapy Supplies (FLUTTER) DEVI Use as directed. 1 each 0  . sertraline (ZOLOFT) 100 MG tablet Take 1 tablet (100 mg total) by mouth at bedtime. 2 at bedtime    . sodium chloride (OCEAN NASAL SPRAY) 0.65 % nasal spray Place 2 sprays into both nostrils at bedtime. Use as needed    . traMADol (ULTRAM) 50 MG tablet Take 50 mg by mouth 2 (two) times daily as needed (pain).     . traZODone (DESYREL) 50 MG tablet Take 50 mg by mouth at bedtime.      . triamcinolone (NASACORT ALLERGY 24HR) 55 MCG/ACT AERO nasal inhaler Place 1 spray into the nose daily.     No current facility-administered medications for this visit.      Allergies:    Penicillins; Prednisone; and Sulfonamide derivatives   Social History:  The patient  reports that  has never smoked. she has never used smokeless tobacco. She reports that she does not drink alcohol or use drugs.   Family History:   family history includes Asthma in her daughter; Colon cancer in her paternal grandfather; Coronary artery disease in her mother; Diabetes in her father; Prostate cancer in her brother; Scoliosis in her daughter; Throat cancer in her mother.    Review of Systems: Review of Systems  Constitutional: Negative.   Respiratory: Negative.   Cardiovascular: Negative.   Gastrointestinal: Negative.   Musculoskeletal: Negative.   Neurological: Negative.   Psychiatric/Behavioral: Negative.   All other systems reviewed and are negative.    PHYSICAL EXAM: VS:  There were no vitals taken for this visit. , BMI There is no height or weight on file to calculate BMI. GEN: Well nourished,  well developed, in no acute distress  HEENT: normal  Neck: no JVD, carotid bruits, or masses Cardiac: RRR; no murmurs, rubs, or gallops,no edema  Respiratory:  clear to auscultation bilaterally, normal work of breathing GI: soft, nontender, nondistended, + BS MS: no deformity or atrophy  Skin: warm and dry, no rash Neuro:  Strength and sensation are intact Psych: euthymic mood, full affect    Recent Labs: No results found for requested labs within last 8760 hours.    Lipid Panel Lab Results  Component Value Date   CHOL 201 (H) 03/01/2009   HDL 47 03/01/2009   LDLCALC 116 (H) 03/01/2009   TRIG 189 (H) 03/01/2009      Wt Readings from Last 3 Encounters:  01/24/17 156 lb (70.8 kg)  07/26/16 148 lb 12 oz (67.5 kg)  03/30/16 147 lb (66.7 kg)       ASSESSMENT AND PLAN:  Hyperlipidemia LDL goal <70 - Plan: EKG 12-Lead Cholesterol is at goal on the current lipid regimen. No changes to the medications were made.  Essential hypertension - Plan: EKG 12-Lead Long discussion  concerning her blood pressure Somewhat labile in the mornings Despite this would continue her current regimen as detailed above When overmedicated she has orthostasis and is symptomatic   Total encounter time more than 15 minutes  Greater than 50% was spent in counseling and coordination of care with the patient   Disposition:   F/U  12 months   No orders of the defined types were placed in this encounter.    Signed, Esmond Plants, M.D., Ph.D. 01/13/2018  [place], [place]

## 2018-01-14 ENCOUNTER — Ambulatory Visit: Payer: Self-pay | Admitting: Cardiovascular Disease

## 2018-02-02 NOTE — Progress Notes (Signed)
Cardiology Office Note  Date:  02/04/2018   ID:  Christy Richardson, DOB 04-03-1935, MRN [id number]  PCP:  Celene Squibb, MD   Chief Complaint  Patient presents with  . Other    12 month follow up. Patient c/o SOB and blood pressure issues. Meds reviewed verbally with patient.     HPI:  Christy Richardson a 83 y.o.femalewith a history of  labile hypertension,  Anxiety mitral valve prolapse per the patient with no mitral valve regurgitation on echocardiogram 2015 with history of palpitations,  bronchiectasis,  GERD, goirter,  mixed hyperlipidemia,  as well as hypertension with documented grade 1 diastolic dysfunction.  She presents today for routine follow-up of her labile blood pressure and tachycardia  In follow-up today reports that she is bothered by fast heart rate and feels her blood pressure is running high Taking metoprolol 50 mg in the evening, losartan 50 twice daily, amlodipine 5 in the morning  Denies any significant chest pain Chronic shortness of breath, underlying lung disease /bronchiectasis  She has a list of blood pressures, sometimes up to 616 systolic, other times down to 120  Pain in her neck, chronic issue  continued leg weakness. No regular exercise  Total cholesterol 155 on lab work from 2017 with primary care  EKG personally reviewed by myself on todays visit Shows normal sinus rhythm rate 77 bpm no significant ST or T wave changes  Other past medical history reviewed  In October 2012 a nuclear perfusion study revealed normal perfusion  echo Doppler study revealed normal systolic function with grade 1 diastolic dysfunction, mild aortic valve sclerosis without stenosis, trace AR, MR, and mild TR as well as PR. She had upper normal RV pressure 26 mm.  echo Doppler study on 02/02/2014. This showed an ejection fraction of 60-65%. She had grade 1 diastolic dysfunction. There was mild aortic sclerosis without stenosis.    lexiscan perfusion study on  02/04/2014 was normal without scar or ischemia. Post stress ejection fraction was 73%.    CardioNet monitor to assess for palpitations. Unfortunately, she developed significant skin irritation, and only wore this for 9 days. This revealed episodes of sinus rhythm, but she did have episodes of sinus tachycardia with rates in the low 100s. There was there were no episodes of SVT or atrial fibrillation.   PMH:   has a past medical history of Adenomatous polyp of colon (03/2007), Allergic rhinitis, Anxiety, Blood transfusion, Bronchiectasis ([place]), Depression, Diabetes mellitus without complication ([place]), Diverticulosis of colon (without mention of hemorrhage), DOE (dyspnea on exertion), Fatigue, Fecal incontinence, GERD (gastroesophageal reflux disease), Glaucoma, Goiter, Heart murmur, Hyperlipidemia, Hypertension, Hypothyroidism, IBS (irritable bowel syndrome), Migraine, Mitral valve prolapse, Neck mass, Nonspecific abnormal electrocardiogram (ECG) (EKG), Palpitations, Polyp, sigmoid colon, Pulmonary nodules, Recurrent UTI, and Tremor.  PSH:    Past Surgical History:  Procedure Laterality Date  . ABDOMINAL HYSTERECTOMY    . CARDIOVASCULAR STRESS TEST  09/19/2011   No scintigraphic evidence of inducible myocardial ischemia. Pharmacological stress test without chest pain or EKG changes for ischemia.  . COLONOSCOPY    . ESOPHAGOGASTRODUODENOSCOPY    . MASTOIDECTOMY     rt ear  . OOPHORECTOMY    . TOTAL HIP ARTHROPLASTY  5/10   left  . TRANSTHORACIC ECHOCARDIOGRAM  09/19/2011   EF >07%, stage 1 diastolic dysfunction, mild tricuspid valve regurg    Current Outpatient Medications  Medication Sig Dispense Refill  . ADVAIR DISKUS 250-50 MCG/DOSE AEPB INHALE 1 PUFF TWICE DAILY. 60 each 5  .  Alpha-D-Galactosidase (BEANO) TABS Take 1 tablet by mouth as needed (for gas).     . ALPRAZolam (XANAX) 1 MG tablet Take 1 tablet (1 mg total) by mouth at bedtime as needed.    Marland Kitchen amLODipine (NORVASC) 5 MG  tablet TAKE (1) TABLET BY MOUTH DAILY AFTER BREAKFAST. 90 tablet 2  . aspirin 81 MG tablet Take 81 mg by mouth daily.      . brimonidine (ALPHAGAN) 0.2 % ophthalmic solution Place 1 drop into both eyes 2 (two) times daily. Place 1 drop into both eyes 2 (two) times daily.    . cetirizine (ZYRTEC) 10 MG tablet Take 10 mg by mouth daily.     . cyclobenzaprine (FLEXERIL) 10 MG tablet Take 10 mg by mouth 3 (three) times daily as needed for muscle spasms.    . dorzolamide (TRUSOPT) 2 % ophthalmic solution Place 1 drop into both eyes 2 (two) times daily. Place 1 drop into both eyes 2 (two) times daily.    Marland Kitchen levothyroxine (SYNTHROID, LEVOTHROID) 50 MCG tablet Take 50 mcg by mouth daily before breakfast.    . LORazepam (ATIVAN) 0.5 MG tablet Take 0.5 mg by mouth 2 (two) times daily as needed (tremors).    . losartan (COZAAR) 100 MG tablet Take 50 mg by mouth 2 (two) times daily.    . meloxicam (MOBIC) 15 MG tablet Take 15 mg by mouth daily.    . metoprolol succinate (TOPROL-XL) 50 MG 24 hr tablet Take 1 tablet (50 mg total) by mouth daily. 90 tablet 3  . Multiple Vitamins-Minerals (MULTIVITAMIN WITH MINERALS) tablet Take 1 tablet by mouth daily.      . pantoprazole (PROTONIX) 40 MG tablet Take 40 mg by mouth daily.     . pravastatin (PRAVACHOL) 40 MG tablet Take 1 tablet (40 mg total) by mouth at bedtime.    Marland Kitchen Respiratory Therapy Supplies (FLUTTER) DEVI Use as directed. 1 each 0  . sertraline (ZOLOFT) 100 MG tablet Take 1 tablet (100 mg total) by mouth at bedtime. 2 at bedtime    . sodium chloride (OCEAN NASAL SPRAY) 0.65 % nasal spray Place 2 sprays into both nostrils at bedtime. Use as needed    . traMADol (ULTRAM) 50 MG tablet Take 50 mg by mouth 2 (two) times daily as needed (pain).     . traZODone (DESYREL) 50 MG tablet Take 50 mg by mouth at bedtime.      . triamcinolone (NASACORT ALLERGY 24HR) 55 MCG/ACT AERO nasal inhaler Place 1 spray into the nose daily.     No current facility-administered  medications for this visit.      Allergies:   Penicillins; Prednisone; and Sulfonamide derivatives   Social History:  The patient  reports that  has never smoked. she has never used smokeless tobacco. She reports that she does not drink alcohol or use drugs.   Family History:   family history includes Asthma in her daughter; Colon cancer in her paternal grandfather; Coronary artery disease in her mother; Diabetes in her father; Prostate cancer in her brother; Scoliosis in her daughter; Throat cancer in her mother.    Review of Systems: Review of Systems  Constitutional: Negative.   Respiratory: Negative.   Cardiovascular: Negative.   Gastrointestinal: Negative.   Musculoskeletal: Negative.   Neurological: Negative.   Psychiatric/Behavioral: Negative.   All other systems reviewed and are negative.    PHYSICAL EXAM: VS:  BP 136/66 (BP Location: Left Arm, Patient Position: Sitting, Cuff Size: Normal)   Pulse  77   Ht 5\' 8"  (1.727 m)   Wt 160 lb 4 oz (72.7 kg)   BMI 24.37 kg/m  , BMI Body mass index is 24.37 kg/m. Constitutional:  oriented to person, place, and time. No distress.  HENT:  Head: Normocephalic and atraumatic.  Eyes:  no discharge. No scleral icterus.  Neck: Normal range of motion. Neck supple. No JVD present.  Cardiovascular: Normal rate, regular rhythm, normal heart sounds and intact distal pulses. Exam reveals no gallop and no friction rub. No edema No murmur heard. Pulmonary/Chest: Effort normal and breath sounds normal. No stridor. No respiratory distress.  no wheezes.  no rales.  no tenderness.  Abdominal: Soft.  no distension.  no tenderness.  Musculoskeletal: Normal range of motion.  no  tenderness or deformity.  Neurological:  normal muscle tone. Coordination normal. No atrophy Skin: Skin is warm and dry. No rash noted. not diaphoretic.  Psychiatric:  normal mood and affect. behavior is normal. Thought content normal.    Recent Labs: No results found  for requested labs within last 8760 hours.    Lipid Panel Lab Results  Component Value Date   CHOL 201 (H) 03/01/2009   HDL 47 03/01/2009   LDLCALC 116 (H) 03/01/2009   TRIG 189 (H) 03/01/2009      Wt Readings from Last 3 Encounters:  02/04/18 160 lb 4 oz (72.7 kg)  01/24/17 156 lb (70.8 kg)  07/26/16 148 lb 12 oz (67.5 kg)       ASSESSMENT AND PLAN:  Hyperlipidemia LDL goal <70 - Plan: EKG 12-Lead Cholesterol is at goal on the current lipid regimen. No changes to the medications were made.  Stable  Essential hypertension - Plan: EKG 12-Lead Possibly exacerbated by anxiety Long discussion concerning her blood pressures Recommended she increase metoprolol succinate up to 75 mg in the evening This may help her heart rate and blood pressure during the daytime If she continues to be symptomatic from fast heart rate, potentially could take 100 mg in the evening Could even add low-dose metoprolol in the morning For very high blood pressure with normal heart rate recommended she take amlodipine extra 5 mg as needed if blood pressure does not improve on recheck  Bronchiectasis Reports lung condition is stable, no recent exacerbation  Anxiety Suspect playing a role in her tachycardia and concern about her blood pressure and heart rate .  Tried to reassure her that numbers are not particularly bad at home   Total encounter time more than 25 minutes  Greater than 50% was spent in counseling and coordination of care with the patient   Disposition:   F/U  12 months   Orders Placed This Encounter  Procedures  . EKG 12-Lead     Signed, Esmond Plants, M.D., Ph.D. 02/04/2018  [place], [place]

## 2018-02-04 ENCOUNTER — Encounter: Payer: Self-pay | Admitting: Cardiovascular Disease

## 2018-02-04 ENCOUNTER — Ambulatory Visit: Payer: PPO | Admitting: Cardiovascular Disease

## 2018-02-04 VITALS — BP 136/66 | HR 77 | Ht 68.0 in | Wt 160.2 lb

## 2018-02-04 DIAGNOSIS — I10 Essential (primary) hypertension: Secondary | ICD-10-CM

## 2018-02-04 DIAGNOSIS — F419 Anxiety disorder, unspecified: Secondary | ICD-10-CM

## 2018-02-04 DIAGNOSIS — E785 Hyperlipidemia, unspecified: Secondary | ICD-10-CM | POA: Diagnosis not present

## 2018-02-04 DIAGNOSIS — J479 Bronchiectasis, uncomplicated: Secondary | ICD-10-CM | POA: Diagnosis not present

## 2018-02-04 DIAGNOSIS — R000 Tachycardia, unspecified: Secondary | ICD-10-CM

## 2018-02-04 DIAGNOSIS — E782 Mixed hyperlipidemia: Secondary | ICD-10-CM

## 2018-02-04 DIAGNOSIS — R0602 Shortness of breath: Secondary | ICD-10-CM

## 2018-02-04 MED ORDER — METOPROLOL SUCCINATE ER 50 MG PO TB24: 50.0000 mg | ORAL_TABLET | Freq: Two times a day (BID) | ORAL | 3 refills | Status: DC

## 2018-02-04 MED ORDER — LOSARTAN POTASSIUM 100 MG PO TABS: 50.0000 mg | ORAL_TABLET | Freq: Two times a day (BID) | ORAL | 3 refills | Status: DC

## 2018-02-04 NOTE — Patient Instructions (Addendum)
Medication Instructions:   Please increase the metoprolol up to 1 1/2 pills at bedtime for blood pressure and heart rate  Wait 1 to 2 weeks IF heart rate continues to run high, Consider increasing up to 2 of the 50 mg pills at night  Labwork:  No new labs needed  Testing/Procedures:  No further testing at this time   Follow-Up: It was a pleasure seeing you in the office today. Please call us if you have new issues that need to be addressed before your next appt.  [phone]  Your physician wants you to follow-up in: 12 months.  You will receive a reminder letter in the mail two months in advance. If you don't receive a letter, please call our office to schedule the follow-up appointment.  If you need a refill on your cardiac medications before your next appointment, please call your pharmacy.  For educational health videos Log in to : [email or web address] Or : [email or web address], password : [email or web address]

## 2018-02-07 ENCOUNTER — Telehealth: Payer: Self-pay | Admitting: Cardiovascular Disease

## 2018-02-07 NOTE — Telephone Encounter (Signed)
I called and spoke with the patient. I advised her that per Dr. Donivan Scull office note on 02/04/18, she was advised to take:  - Metoprolol succinate 50 mg- 1 & 1/2 tablets (75 mg) at night   If over the next 1-2 weeks her HR is still running high, then she could increase metoprolol succinate 50 mg- take 2 pills (100 mg) at night.  The patient states she thought this is how she understood this in clinic, but was confused when her prescription bottle for metoprolol read 50 mg- take 1 tablet BID.  She verbalizes understanding of how to take her metoprolol as advised by Dr. Rockey Situ.

## 2018-02-07 NOTE — Telephone Encounter (Signed)
Pt c/o medication issue:  1. Name of Medication: metoprolol  2. How are you currently taking this medication (dosage and times per day)? Please clarify   3. Are you having a reaction (difficulty breathing--STAT)? no  4. What is your medication issue? Please clarify dosage and frequency

## 2018-02-18 ENCOUNTER — Ambulatory Visit: Payer: PPO | Admitting: Pulmonary Disease

## 2018-02-18 ENCOUNTER — Encounter: Payer: Self-pay | Admitting: Pulmonary Disease

## 2018-02-18 ENCOUNTER — Ambulatory Visit (INDEPENDENT_AMBULATORY_CARE_PROVIDER_SITE_OTHER)
Admission: RE | Admit: 2018-02-18 | Discharge: 2018-02-18 | Disposition: A | Discharge status: Home / Self Care | Payer: PPO | Source: Ambulatory Visit | Attending: Pulmonary Disease | Admitting: Pulmonary Disease

## 2018-02-18 VITALS — BP 122/68 | HR 84 | Ht 68.0 in | Wt 160.0 lb

## 2018-02-18 DIAGNOSIS — J479 Bronchiectasis, uncomplicated: Secondary | ICD-10-CM | POA: Diagnosis not present

## 2018-02-18 DIAGNOSIS — R911 Solitary pulmonary nodule: Secondary | ICD-10-CM | POA: Diagnosis not present

## 2018-02-18 DIAGNOSIS — R0609 Other forms of dyspnea: Secondary | ICD-10-CM | POA: Diagnosis not present

## 2018-02-18 DIAGNOSIS — J300 Vasomotor rhinitis: Secondary | ICD-10-CM

## 2018-02-18 MED ORDER — FLUTICASONE-SALMETEROL 115-21 MCG/ACT IN AERO: 2.0000 | INHALATION_SPRAY | Freq: Two times a day (BID) | RESPIRATORY_TRACT | 11 refills | Status: DC

## 2018-02-18 MED ORDER — IPRATROPIUM BROMIDE 0.03 % NA SOLN: 2.0000 | Freq: Two times a day (BID) | NASAL | 12 refills | Status: DC

## 2018-02-18 NOTE — Patient Instructions (Signed)
Vasomotor rhinitis: -start ipratropium nasal spray (0.03%) 2 puffs bid-tid as needed for cough  Bronchiectasis: Keep taking Advair HFA 2 puffs twice a day no matter how you feel Use albuterol as needed for chest tightness wheezing or shortness of breath We will check a lung function test to assess the severity of your bronchiectasis We will check a chest x-ray Use your flutter valve 4-5 breaths, 4-5 times a day  Shortness of breath: Lung function test Chest x-ray  Deconditioning: I think you should exercise regularly, try to walk 4-5 times a day at least 15-20 minutes at a time  Follow up 6 weeks.

## 2018-02-18 NOTE — Progress Notes (Signed)
Subjective:    Patient ID: Christy Richardson, female    DOB: 1934-12-16, 83 y.o.   MRN: [id number]  Synopsis: This is a very pleasant female with bronchiectasis presumably from childhood pertussis.  She switched care from [facility] to [facility] ([facility]) in 05/2012.  Her simple spirometry in 2013 was 78% predicted.  HPI Chief Complaint  Patient presents with  . Follow-up    l/s 03/2016.  c/o worsening sob with exertion.     Abi says she has conitnued shortness of breaht all the time.  She had the flu a year ago which bothers her some.  She has a lot of congestion in her chest.  She says that she has a lot throat congestion and ticckel in her throat.  Dr. Tami Ribas has looked at it a few times with a camera and doesn't see much.  She will rarely cough up mucus, but her cough is typically dry.  She clears her throat.  Her nose drips "12 months a year".  She takes nasacort, zyrtec but still has this.    Her glaucoma has been up and down lately.    Past Medical History:  Diagnosis Date  . Adenomatous polyp of colon 03/2007  . Allergic rhinitis   . Anxiety   . Blood transfusion   . Bronchiectasis ([place])   . Depression   . Diabetes mellitus without complication ([place])   . Diverticulosis of colon (without mention of hemorrhage)   . DOE (dyspnea on exertion)   . Fatigue   . Fecal incontinence   . GERD (gastroesophageal reflux disease)   . Glaucoma   . Goiter   . Heart murmur   . Hyperlipidemia   . Hypertension   . Hypothyroidism   . IBS (irritable bowel syndrome)   . Migraine   . Mitral valve prolapse   . Neck mass   . Nonspecific abnormal electrocardiogram (ECG) (EKG)   . Palpitations   . Polyp, sigmoid colon   . Pulmonary nodules   . Recurrent UTI   . Tremor      Review of Systems  Constitutional: Negative for chills, fatigue and fever.  HENT: Negative for postnasal drip, rhinorrhea and sinus pressure.   Respiratory: Positive for shortness of breath. Negative for cough  and wheezing.   Cardiovascular: Negative for chest pain, palpitations and leg swelling.  4    Objective:   Physical Exam  Vitals:   02/18/18 1352  BP: 122/68  Pulse: 84  SpO2: 95%  Weight: 160 lb (72.6 kg)  Height: 5\' 8"  (1.727 m)   RA  Gen: well appearing HENT: OP clear, TM's clear, neck supple PULM: CTA B, normal percussion CV: RRR, no mgr, trace edema GI: BS+, soft, nontender Derm: no cyanosis or rash Psyche: normal mood and affect   PFT: 05/2012 simple spirometry> > Ratio 63%, FEV1 1.98L (78% pred) 05/05/2015 simple spirometry> ratio 60%, FEV1 1.79 L (77% predicted).   Chest imaging: 11/2012 CT chest [facility] >> bilateral bronchiectasis, scattered nodules no greater than 18mm in size March 2015 simple spirometry > ratio 62%, FEV1 1.87 L (79% predicted) February 2015 chest x-ray images reviewed: Elevated right hemidiaphragm, some chronic bronchitic changes, otherwise clear     Assessment & Plan:    Bronchiectasis without complication (HCC)  Vasomotor rhinitis  Dyspnea on exertion  Discussion: I have not seen Yamel in approximately 2 years but I suppose that the good thing because she still aboveground.  She has been struggling with a dry cough,  and shortness of breath.  She is not been very compliant with her mucociliary clearance measures.  She is asking about her shortness of breath today.  I think it is probably best to repeat lung function testing and a chest x-ray to get an idea of where her bronchiectasis is.  I think the cough is likely exacerbated by vasomotor rhinitis as she is taking a good allergic rhinitis regimen.  Plan: Vasomotor rhinitis: -start ipratropium nasal spray (0.03%) 2 puffs bid-tid as needed for cough  Bronchiectasis: Keep taking Advair HFA 2 puffs twice a day no matter how you feel Use albuterol as needed for chest tightness wheezing or shortness of breath We will check a lung function test to assess the severity of your  bronchiectasis We will check a chest x-ray Use your flutter valve 4-5 breaths, 4-5 times a day  Shortness of breath: Lung function test Chest x-ray Check ambulatory oximetry  Deconditioning: I think you should exercise regularly, try to walk 4-5 times a day at least 15-20 minutes at a time  Follow up 6 weeks.     Current Outpatient Medications:  .  ADVAIR DISKUS 250-50 MCG/DOSE AEPB, INHALE 1 PUFF TWICE DAILY., Disp: 60 each, Rfl: 5 .  Alpha-D-Galactosidase (BEANO) TABS, Take 1 tablet by mouth as needed (for gas). , Disp: , Rfl:  .  ALPRAZolam (XANAX) 1 MG tablet, Take 1 tablet (1 mg total) by mouth at bedtime as needed., Disp: , Rfl:  .  amLODipine (NORVASC) 5 MG tablet, TAKE (1) TABLET BY MOUTH DAILY AFTER BREAKFAST., Disp: 90 tablet, Rfl: 2 .  aspirin 81 MG tablet, Take 81 mg by mouth daily.  , Disp: , Rfl:  .  brimonidine (ALPHAGAN) 0.2 % ophthalmic solution, Place 1 drop into both eyes 2 (two) times daily. Place 1 drop into both eyes 2 (two) times daily., Disp: , Rfl:  .  cetirizine (ZYRTEC) 10 MG tablet, Take 10 mg by mouth daily. , Disp: , Rfl:  .  cyclobenzaprine (FLEXERIL) 10 MG tablet, Take 10 mg by mouth 3 (three) times daily as needed for muscle spasms., Disp: , Rfl:  .  dorzolamide (TRUSOPT) 2 % ophthalmic solution, Place 1 drop into both eyes 2 (two) times daily. Place 1 drop into both eyes 2 (two) times daily., Disp: , Rfl:  .  levothyroxine (SYNTHROID, LEVOTHROID) 50 MCG tablet, Take 50 mcg by mouth daily before breakfast., Disp: , Rfl:  .  LORazepam (ATIVAN) 0.5 MG tablet, Take 0.5 mg by mouth 2 (two) times daily as needed (tremors)., Disp: , Rfl:  .  losartan (COZAAR) 100 MG tablet, Take 0.5 tablets (50 mg total) by mouth 2 (two) times daily., Disp: 90 tablet, Rfl: 3 .  meloxicam (MOBIC) 15 MG tablet, Take 15 mg by mouth daily., Disp: , Rfl:  .  metoprolol succinate (TOPROL-XL) 50 MG 24 hr tablet, Take 1 tablet (50 mg total) by mouth 2 (two) times daily., Disp: 180  tablet, Rfl: 3 .  Multiple Vitamins-Minerals (MULTIVITAMIN WITH MINERALS) tablet, Take 1 tablet by mouth daily.  , Disp: , Rfl:  .  pantoprazole (PROTONIX) 40 MG tablet, Take 40 mg by mouth daily. , Disp: , Rfl:  .  pravastatin (PRAVACHOL) 40 MG tablet, Take 1 tablet (40 mg total) by mouth at bedtime., Disp: , Rfl:  .  Respiratory Therapy Supplies (FLUTTER) DEVI, Use as directed., Disp: 1 each, Rfl: 0 .  sertraline (ZOLOFT) 100 MG tablet, Take 1 tablet (100 mg total) by mouth at bedtime.  2 at bedtime, Disp: , Rfl:  .  sodium chloride (OCEAN NASAL SPRAY) 0.65 % nasal spray, Place 2 sprays into both nostrils at bedtime. Use as needed, Disp: , Rfl:  .  traMADol (ULTRAM) 50 MG tablet, Take 50 mg by mouth 2 (two) times daily as needed (pain). , Disp: , Rfl:  .  traZODone (DESYREL) 50 MG tablet, Take 50 mg by mouth at bedtime.  , Disp: , Rfl:  .  triamcinolone (NASACORT ALLERGY 24HR) 55 MCG/ACT AERO nasal inhaler, Place 1 spray into the nose daily., Disp: , Rfl:

## 2018-02-18 NOTE — Addendum Note (Signed)
Addended by: Len Blalock on: 02/18/2018 02:47 PM   Modules accepted: Orders

## 2018-02-18 NOTE — Addendum Note (Signed)
Addended by: Len Blalock on: 02/18/2018 02:37 PM   Modules accepted: Orders

## 2018-02-19 ENCOUNTER — Telehealth: Payer: Self-pay | Admitting: Cardiovascular Disease

## 2018-02-19 DIAGNOSIS — E039 Hypothyroidism, unspecified: Secondary | ICD-10-CM | POA: Diagnosis not present

## 2018-02-19 DIAGNOSIS — R7301 Impaired fasting glucose: Secondary | ICD-10-CM | POA: Diagnosis not present

## 2018-02-19 DIAGNOSIS — M542 Cervicalgia: Secondary | ICD-10-CM | POA: Diagnosis not present

## 2018-02-19 DIAGNOSIS — I10 Essential (primary) hypertension: Secondary | ICD-10-CM | POA: Diagnosis not present

## 2018-02-19 NOTE — Telephone Encounter (Signed)
S/w patient. At last office visit on 02/04/18 with Dr Rockey Situ: "Please increase the metoprolol up to 1 1/2 pills at bedtime for blood pressure and heart rate Wait 1 to 2 weeks IF heart rate continues to run high, Consider increasing up to 2 of the 50 mg pills at night"  Now, patient is taking metoprolol 75 mg and losartan 50 mg at bedtime. She feels this is helping her heart rate come down in the morning but now BP is running lower. She's not been taking her 1/2 tablet of losartan in the mornings because of low BP. She take amlodipine later in the day if BP comes back up some. So in the morning she is not taking amlodipine or losartan consistently.  3/14 122/65, 78 at 0915 Before meds 3/15 103/58, 76 Before meds. 3/16 134/78, 74 at 0930, Before meds 3/18 121/69, 78 at 1235, before meds. 3/19 105/59, 99 at 0955 before meds 3/20 122/79, 80 before meds.  She does not know how to continue her morning medication regimen at this time. She's afraid of becoming dizziness and passing out later in the day if her BP drops if she takes her morning BP meds. Routing to Dr Rockey Situ to advise.

## 2018-02-19 NOTE — Progress Notes (Signed)
Was able to talk to the patient regarding their results.  They verbalized an understanding of what was discussed. No further questions at this time. 

## 2018-02-19 NOTE — Telephone Encounter (Signed)
Pt calling stating she is now  half metoprolol at night to help lower BP  Now when she states when she wakes up everything is running really low  Runs around 121/69 hr 79   When this happens she is not sure if she is to take Atorvastatin after breakfast   She took her BP today and it was 105/59  hr 99  She is needing some advise on this  Please call back

## 2018-02-20 ENCOUNTER — Encounter (HOSPITAL_COMMUNITY): Payer: Self-pay

## 2018-02-21 DIAGNOSIS — M799 Soft tissue disorder, unspecified: Secondary | ICD-10-CM | POA: Diagnosis not present

## 2018-02-21 DIAGNOSIS — K219 Gastro-esophageal reflux disease without esophagitis: Secondary | ICD-10-CM | POA: Diagnosis not present

## 2018-02-21 DIAGNOSIS — M542 Cervicalgia: Secondary | ICD-10-CM | POA: Diagnosis not present

## 2018-02-21 DIAGNOSIS — H4010X0 Unspecified open-angle glaucoma, stage unspecified: Secondary | ICD-10-CM | POA: Diagnosis not present

## 2018-02-21 DIAGNOSIS — I10 Essential (primary) hypertension: Secondary | ICD-10-CM | POA: Diagnosis not present

## 2018-02-21 DIAGNOSIS — E039 Hypothyroidism, unspecified: Secondary | ICD-10-CM | POA: Diagnosis not present

## 2018-02-21 DIAGNOSIS — J479 Bronchiectasis, uncomplicated: Secondary | ICD-10-CM | POA: Diagnosis not present

## 2018-02-21 DIAGNOSIS — E782 Mixed hyperlipidemia: Secondary | ICD-10-CM | POA: Diagnosis not present

## 2018-02-21 DIAGNOSIS — J060 Acute laryngopharyngitis: Secondary | ICD-10-CM | POA: Diagnosis not present

## 2018-02-21 DIAGNOSIS — E875 Hyperkalemia: Secondary | ICD-10-CM | POA: Diagnosis not present

## 2018-02-21 DIAGNOSIS — R7301 Impaired fasting glucose: Secondary | ICD-10-CM | POA: Diagnosis not present

## 2018-02-23 NOTE — Telephone Encounter (Signed)
Would hold amlodipine and losartan for now and monitor BP and heart rate

## 2018-02-24 NOTE — Telephone Encounter (Signed)
No answer. Left message to call back.   

## 2018-02-24 NOTE — Telephone Encounter (Signed)
Patient verbalized to hold amlodipine and morning dose of losartan. She says that's basically what she's been doing and it is working well.

## 2018-02-25 ENCOUNTER — Ambulatory Visit (HOSPITAL_COMMUNITY)
Admission: RE | Admit: 2018-02-25 | Discharge: 2018-02-25 | Disposition: A | Discharge status: Home / Self Care | Payer: PPO | Source: Ambulatory Visit | Attending: Pulmonary Disease | Admitting: Pulmonary Disease

## 2018-02-25 DIAGNOSIS — R0609 Other forms of dyspnea: Principal | ICD-10-CM

## 2018-02-25 LAB — PULMONARY FUNCTION TEST
DL/VA % PRED: 58 %
DL/VA: 3.02 ml/min/mmHg/L
DLCO UNC % PRED: 51 %
DLCO UNC: 14.54 ml/min/mmHg
FEF 25-75 Pre: 0.88 L/sec
FEF2575-%Pred-Pre: 56 %
FEV1-%Pred-Pre: 86 %
FEV1-Pre: 1.92 L
FEV1FVC-%PRED-PRE: 82 %
FEV6-%PRED-PRE: 106 %
FEV6-PRE: 2.98 L
FEV6FVC-%PRED-PRE: 99 %
FVC-%Pred-Pre: 106 %
FVC-Pre: 3.15 L
PRE FEV1/FVC RATIO: 61 %
PRE FEV6/FVC RATIO: 95 %
RV % PRED: 86 %
RV: 2.21 L
TLC % PRED: 100 %
TLC: 5.53 L

## 2018-02-25 MED ORDER — ALBUTEROL SULFATE (2.5 MG/3ML) 0.083% IN NEBU: 2.5000 mg | INHALATION_SOLUTION | Freq: Once | RESPIRATORY_TRACT | Status: DC

## 2018-03-19 DIAGNOSIS — H903 Sensorineural hearing loss, bilateral: Secondary | ICD-10-CM | POA: Diagnosis not present

## 2018-03-19 DIAGNOSIS — H7012 Chronic mastoiditis, left ear: Secondary | ICD-10-CM | POA: Diagnosis not present

## 2018-03-20 ENCOUNTER — Other Ambulatory Visit: Payer: Self-pay

## 2018-03-20 ENCOUNTER — Emergency Department (HOSPITAL_COMMUNITY)
Admission: EM | Admit: 2018-03-20 | Discharge: 2018-03-21 | Disposition: A | Discharge status: Home / Self Care | Payer: PPO | Attending: Emergency Medicine | Admitting: Emergency Medicine

## 2018-03-20 ENCOUNTER — Emergency Department (HOSPITAL_COMMUNITY): Payer: PPO

## 2018-03-20 ENCOUNTER — Encounter (HOSPITAL_COMMUNITY): Payer: Self-pay | Admitting: Emergency Medicine

## 2018-03-20 DIAGNOSIS — E039 Hypothyroidism, unspecified: Secondary | ICD-10-CM | POA: Insufficient documentation

## 2018-03-20 DIAGNOSIS — K279 Peptic ulcer, site unspecified, unspecified as acute or chronic, without hemorrhage or perforation: Secondary | ICD-10-CM | POA: Diagnosis not present

## 2018-03-20 DIAGNOSIS — I110 Hypertensive heart disease with heart failure: Secondary | ICD-10-CM | POA: Insufficient documentation

## 2018-03-20 DIAGNOSIS — R110 Nausea: Secondary | ICD-10-CM

## 2018-03-20 DIAGNOSIS — R1310 Dysphagia, unspecified: Secondary | ICD-10-CM | POA: Diagnosis not present

## 2018-03-20 DIAGNOSIS — Z96642 Presence of left artificial hip joint: Secondary | ICD-10-CM | POA: Diagnosis not present

## 2018-03-20 DIAGNOSIS — Z79899 Other long term (current) drug therapy: Secondary | ICD-10-CM | POA: Insufficient documentation

## 2018-03-20 DIAGNOSIS — E119 Type 2 diabetes mellitus without complications: Secondary | ICD-10-CM | POA: Diagnosis not present

## 2018-03-20 DIAGNOSIS — R197 Diarrhea, unspecified: Secondary | ICD-10-CM

## 2018-03-20 DIAGNOSIS — R000 Tachycardia, unspecified: Secondary | ICD-10-CM | POA: Diagnosis not present

## 2018-03-20 DIAGNOSIS — I5030 Unspecified diastolic (congestive) heart failure: Secondary | ICD-10-CM | POA: Diagnosis not present

## 2018-03-20 DIAGNOSIS — Z7982 Long term (current) use of aspirin: Secondary | ICD-10-CM | POA: Insufficient documentation

## 2018-03-20 DIAGNOSIS — R1030 Lower abdominal pain, unspecified: Secondary | ICD-10-CM | POA: Diagnosis not present

## 2018-03-20 LAB — COMPREHENSIVE METABOLIC PANEL
ALK PHOS: 55 U/L (ref 38–126)
ALT: 17 U/L (ref 14–54)
ANION GAP: 11 (ref 5–15)
AST: 24 U/L (ref 15–41)
Albumin: 4.2 g/dL (ref 3.5–5.0)
BUN: 14 mg/dL (ref 6–20)
CALCIUM: 9.6 mg/dL (ref 8.9–10.3)
CO2: 24 mmol/L (ref 22–32)
Chloride: 100 mmol/L — ABNORMAL LOW (ref 101–111)
Creatinine, Ser: 1.02 mg/dL — ABNORMAL HIGH (ref 0.44–1.00)
GFR calc Af Amer: 59 mL/min — ABNORMAL LOW (ref >60)
GFR, EST NON AFRICAN AMERICAN: 51 mL/min — AB (ref >60)
Glucose, Bld: 120 mg/dL — ABNORMAL HIGH (ref 65–99)
POTASSIUM: 4.2 mmol/L (ref 3.5–5.1)
Sodium: 135 mmol/L (ref 135–145)
TOTAL PROTEIN: 7.6 g/dL (ref 6.5–8.1)
Total Bilirubin: 1.3 mg/dL — ABNORMAL HIGH (ref 0.3–1.2)

## 2018-03-20 LAB — CBC
HEMATOCRIT: 47.9 % — AB (ref 36.0–46.0)
HEMOGLOBIN: 15.9 g/dL — AB (ref 12.0–15.0)
MCH: 30.5 pg (ref 26.0–34.0)
MCHC: 33.2 g/dL (ref 30.0–36.0)
MCV: 91.9 fL (ref 78.0–100.0)
Platelets: 221 10*3/uL (ref 150–400)
RBC: 5.21 MIL/uL — ABNORMAL HIGH (ref 3.87–5.11)
RDW: 12.7 % (ref 11.5–15.5)
WBC: 10.8 10*3/uL — AB (ref 4.0–10.5)

## 2018-03-20 LAB — LIPASE, BLOOD: Lipase: 27 U/L (ref 11–51)

## 2018-03-20 MED ORDER — ONDANSETRON HCL 4 MG/2ML IJ SOLN
4.0000 mg | Freq: Once | INTRAMUSCULAR | Status: AC
  Administered 2018-03-20: 4 mg via INTRAVENOUS
  Filled 2018-03-20: qty 2

## 2018-03-20 MED ORDER — FAMOTIDINE IN NACL 20-0.9 MG/50ML-% IV SOLN
20.0000 mg | Freq: Once | INTRAVENOUS | Status: AC
  Administered 2018-03-21: 20 mg via INTRAVENOUS
  Filled 2018-03-20: qty 50

## 2018-03-20 MED ORDER — IOPAMIDOL (ISOVUE-300) INJECTION 61%
100.0000 mL | Freq: Once | INTRAVENOUS | Status: AC | PRN
  Administered 2018-03-21: 100 mL via INTRAVENOUS

## 2018-03-20 MED ORDER — SODIUM CHLORIDE 0.9 % IV BOLUS: 1000.0000 mL | Freq: Once | INTRAVENOUS | Status: DC

## 2018-03-20 MED ORDER — LOPERAMIDE HCL 2 MG PO CAPS
4.0000 mg | ORAL_CAPSULE | Freq: Once | ORAL | Status: AC
  Administered 2018-03-20: 4 mg via ORAL
  Filled 2018-03-20: qty 2

## 2018-03-20 MED ORDER — FENTANYL CITRATE (PF) 100 MCG/2ML IJ SOLN
50.0000 ug | Freq: Once | INTRAMUSCULAR | Status: AC
  Administered 2018-03-20: 50 ug via INTRAVENOUS
  Filled 2018-03-20: qty 2

## 2018-03-20 MED ORDER — DICYCLOMINE HCL 10 MG/ML IM SOLN
20.0000 mg | Freq: Once | INTRAMUSCULAR | Status: AC
Start: 2018-03-20 — End: 2018-03-20
  Administered 2018-03-20: 20 mg via INTRAMUSCULAR
  Filled 2018-03-20: qty 2

## 2018-03-20 MED ORDER — SODIUM CHLORIDE 0.9 % IV BOLUS
1000.0000 mL | Freq: Once | INTRAVENOUS | Status: AC
  Administered 2018-03-20: 1000 mL via INTRAVENOUS

## 2018-03-20 NOTE — ED Triage Notes (Signed)
Pt states she woke up today feeling weak. Pt states she has had diarrhea all day. Pt states her abdomen is tender to touch.

## 2018-03-21 ENCOUNTER — Emergency Department (HOSPITAL_COMMUNITY): Payer: PPO

## 2018-03-21 DIAGNOSIS — R197 Diarrhea, unspecified: Secondary | ICD-10-CM | POA: Diagnosis not present

## 2018-03-21 LAB — URINALYSIS, ROUTINE W REFLEX MICROSCOPIC
BACTERIA UA: NONE SEEN
BILIRUBIN URINE: NEGATIVE
Glucose, UA: NEGATIVE mg/dL
HGB URINE DIPSTICK: NEGATIVE
Ketones, ur: NEGATIVE mg/dL
NITRITE: NEGATIVE
Protein, ur: NEGATIVE mg/dL
SQUAMOUS EPITHELIAL / LPF: NONE SEEN
Specific Gravity, Urine: 1.02 (ref 1.005–1.030)
pH: 5 (ref 5.0–8.0)

## 2018-03-21 MED ORDER — ALUM & MAG HYDROXIDE-SIMETH 200-200-20 MG/5ML PO SUSP
15.0000 mL | Freq: Once | ORAL | Status: AC
  Administered 2018-03-21: 15 mL via ORAL
  Filled 2018-03-21: qty 30

## 2018-03-21 MED ORDER — ONDANSETRON HCL 4 MG PO TABS: 4.0000 mg | ORAL_TABLET | Freq: Three times a day (TID) | ORAL | 0 refills | Status: DC | PRN

## 2018-03-21 NOTE — ED Notes (Signed)
Pt upset due to being here all day and still not having a definite diagnosis. Informed pt to followup with GI for further testing.

## 2018-03-21 NOTE — ED Provider Notes (Signed)
[facility] EMERGENCY DEPARTMENT Provider Note   CSN: [id number] Arrival date & time: 03/20/18  2005  Time seen 23:13 PM   History   Chief Complaint Chief Complaint  Patient presents with  . Abdominal Pain    HPI Christy Richardson is a 83 y.o. female.  HPI patient states last night she started having lower abdominal pains that shoot across her lower abdomen.  She states when she woke up this morning the pain continued and she felt weak when she got up.  She then started having diarrhea and states from morning until 3 PM she had about 10 episodes of diarrhea that was initially loose and then became watery.  She feels like her abdomen is swollen diffusely.  She has had nausea without vomiting and complains of a lot of "choking".  She also complains of GERD with a burning fluid that gets up in her throat.  She states she has had chills all day and her highest fever at home was 99.7.  She denies dysuria or frequency.  She states she feels dizzy and lightheaded.  She states when she checked her heart rate around 7 PM her heart rate was 118.  She states she has been referred to cardiology for a high heart rate.  However when I asked her what her usual heart rate has been it took her a long time to tell me but finally said 78-80.  She states she did eat some [place] food yesterday and the only thing she ate different from her daughter was a vegetable a roll.  Nobody else is sick at home.  She states she has had abdominal surgeries in the past including hysterectomy and oophorectomy, and also she has had some scar tissue.  She denies any recent antibiotics.  She states she has IBS-D.  PCP Celene Squibb, MD   Past Medical History:  Diagnosis Date  . Adenomatous polyp of colon 03/2007  . Allergic rhinitis   . Anxiety   . Blood transfusion   . Bronchiectasis ([place])   . Depression   . Diabetes mellitus without complication ([place])   . Diverticulosis of colon (without mention of hemorrhage)   . DOE  (dyspnea on exertion)   . Fatigue   . Fecal incontinence   . GERD (gastroesophageal reflux disease)   . Glaucoma   . Goiter   . Heart murmur   . Hyperlipidemia   . Hypertension   . Hypothyroidism   . IBS (irritable bowel syndrome)   . Migraine   . Mitral valve prolapse   . Neck mass   . Nonspecific abnormal electrocardiogram (ECG) (EKG)   . Palpitations   . Polyp, sigmoid colon   . Pulmonary nodules   . Recurrent UTI   . Tremor     Patient Active Problem List   Diagnosis Date Noted  . Sinus tachycardia 02/04/2018  . Hyperlipidemia LDL goal <70 08/16/2015  . Left ventricular diastolic dysfunction, NYHA class 1 03/13/2014  . Chest pain 01/30/2014  . Tremor, essential 05/07/2013  . Fecal incontinence 09/30/2012  . Pulmonary nodules 05/15/2012  . Dyspnea 05/27/2011  . Bronchiectasis without acute exacerbation ([place]) 09/26/2009  . OSTEOPENIA 03/17/2009  . COLONIC POLYPS, ADENOMATOUS, HX OF 09/27/2008  . BACK PAIN 09/01/2008  . LIPOMA 06/01/2008  . UTI'S, RECURRENT 06/10/2007  . Hypothyroidism 01/07/2007  . Hyperlipidemia 01/07/2007  . Anxiety 01/07/2007  . DEPRESSION 01/07/2007  . MIGRAINE HEADACHE 01/07/2007  . GLAUCOMA NEC 01/07/2007  . DECREASED HEARING 01/07/2007  .  Essential hypertension 01/07/2007  . Allergic rhinitis 01/07/2007  . GERD 01/07/2007  . IBS 01/07/2007  . OSTEOARTHRITIS 01/07/2007    Past Surgical History:  Procedure Laterality Date  . ABDOMINAL HYSTERECTOMY    . CARDIOVASCULAR STRESS TEST  09/19/2011   No scintigraphic evidence of inducible myocardial ischemia. Pharmacological stress test without chest pain or EKG changes for ischemia.  . COLONOSCOPY    . ESOPHAGOGASTRODUODENOSCOPY    . MASTOIDECTOMY     rt ear  . OOPHORECTOMY    . TOTAL HIP ARTHROPLASTY  5/10   left  . TRANSTHORACIC ECHOCARDIOGRAM  09/19/2011   EF >86%, stage 1 diastolic dysfunction, mild tricuspid valve regurg     OB History   None      Home Medications     Prior to Admission medications   Medication Sig Start Date End Date Taking? Authorizing Provider  losartan (COZAAR) 100 MG tablet Take 0.5 tablets (50 mg total) by mouth 2 (two) times daily. 02/04/18  Yes Minna Merritts, MD  metoprolol succinate (TOPROL-XL) 50 MG 24 hr tablet Take 75 mg by mouth every evening. Take 1.5 tablets by mouth every evening. Take with or immediately following a meal.    Yes [provider]  Alpha-D-Galactosidase (BEANO) TABS Take 1 tablet by mouth as needed (for gas).  03/30/11   Parrett, Fonnie Mu, NP  ALPRAZolam (XANAX) 1 MG tablet Take 1 tablet (1 mg total) by mouth at bedtime as needed. 03/30/11   Parrett, Tammy S, NP  amLODipine (NORVASC) 5 MG tablet TAKE (1) TABLET BY MOUTH DAILY AFTER BREAKFAST. 06/19/17   Minna Merritts, MD  aspirin 81 MG tablet Take 81 mg by mouth daily.      [provider]  brimonidine (ALPHAGAN) 0.2 % ophthalmic solution Place 1 drop into both eyes 2 (two) times daily. Place 1 drop into both eyes 2 (two) times daily. 12/21/15   [provider]  cetirizine (ZYRTEC) 10 MG tablet Take 10 mg by mouth daily.  03/30/11   Parrett, Fonnie Mu, NP  cyclobenzaprine (FLEXERIL) 10 MG tablet Take 10 mg by mouth 3 (three) times daily as needed for muscle spasms.    [provider]  dorzolamide (TRUSOPT) 2 % ophthalmic solution Place 1 drop into both eyes 2 (two) times daily. Place 1 drop into both eyes 2 (two) times daily. 11/03/15   [provider]  fluticasone-salmeterol (ADVAIR HFA) 115-21 MCG/ACT inhaler Inhale 2 puffs into the lungs 2 (two) times daily. 02/18/18   Juanito Doom, MD  ipratropium (ATROVENT) 0.03 % nasal spray Place 2 sprays into both nostrils every 12 (twelve) hours. 02/18/18   Juanito Doom, MD  levothyroxine (SYNTHROID, LEVOTHROID) 50 MCG tablet Take 50 mcg by mouth daily before breakfast.    [provider]  LORazepam (ATIVAN) 0.5 MG tablet Take 0.5 mg by mouth 2 (two) times daily  as needed (tremors).    [provider]  meloxicam (MOBIC) 15 MG tablet Take 15 mg by mouth daily.    [provider]  Multiple Vitamins-Minerals (MULTIVITAMIN WITH MINERALS) tablet Take 1 tablet by mouth daily.      [provider]  ondansetron (ZOFRAN) 4 MG tablet Take 1 tablet (4 mg total) by mouth every 8 (eight) hours as needed. 03/21/18   Rolland Porter, MD  pantoprazole (PROTONIX) 40 MG tablet Take 40 mg by mouth daily.  05/21/16   [provider]  pravastatin (PRAVACHOL) 40 MG tablet Take 1 tablet (40 mg total)  by mouth at bedtime. 03/30/11   Parrett, Fonnie Mu, NP  Respiratory Therapy Supplies (FLUTTER) DEVI Use as directed. 05/05/15   Juanito Doom, MD  sertraline (ZOLOFT) 100 MG tablet Take 1 tablet (100 mg total) by mouth at bedtime. 2 at bedtime 03/30/11   Parrett, Fonnie Mu, NP  sodium chloride (OCEAN NASAL SPRAY) 0.65 % nasal spray Place 2 sprays into both nostrils at bedtime. Use as needed 03/30/11   Parrett, Fonnie Mu, NP  traMADol (ULTRAM) 50 MG tablet Take 50 mg by mouth 2 (two) times daily as needed (pain).     [provider]  traZODone (DESYREL) 50 MG tablet Take 50 mg by mouth at bedtime.      [provider]  triamcinolone (NASACORT ALLERGY 24HR) 55 MCG/ACT AERO nasal inhaler Place 1 spray into the nose daily.    [provider]    Family History Family History  Problem Relation Age of Onset  . Diabetes Father   . Throat cancer Mother   . Coronary artery disease Mother   . Prostate cancer Brother   . Asthma Daughter   . Scoliosis Daughter   . Colon cancer Paternal Grandfather   . Esophageal cancer Neg Hx   . Rectal cancer Neg Hx   . Stomach cancer Neg Hx     Social History Social History   Tobacco Use  . Smoking status: Never Smoker  . Smokeless tobacco: Never Used  Substance Use Topics  . Alcohol use: No    Alcohol/week: 0.0 oz  . Drug use: No  lives with daughter   Allergies   Penicillins;  Prednisone; and Sulfonamide derivatives   Review of Systems Review of Systems  All other systems reviewed and are negative.    Physical Exam Updated Vital Signs BP (!) 146/81   Pulse 86   Temp 99 F (37.2 C) (Oral)   Resp 19   Wt 72.6 kg (160 lb)   SpO2 95%   BMI 24.33 kg/m   Vital signs normal    Physical Exam  Constitutional: She is oriented to person, place, and time. She appears well-developed and well-nourished.  Non-toxic appearance. She does not appear ill. No distress.  HENT:  Head: Normocephalic and atraumatic.  Right Ear: External ear normal.  Left Ear: External ear normal.  Nose: Nose normal. No mucosal edema or rhinorrhea.  Mouth/Throat: Mucous membranes are dry. No dental abscesses or uvula swelling.  Eyes: Pupils are equal, round, and reactive to light. Conjunctivae and EOM are normal.  Neck: Normal range of motion and full passive range of motion without pain. Neck supple.  Cardiovascular: Normal rate, regular rhythm and normal heart sounds. Exam reveals no gallop and no friction rub.  No murmur heard. Pulmonary/Chest: Effort normal and breath sounds normal. No respiratory distress. She has no wheezes. She has no rhonchi. She has no rales. She exhibits no tenderness and no crepitus.  Abdominal: Soft. Normal appearance and bowel sounds are normal. She exhibits distension. There is tenderness in the right upper quadrant, epigastric area and left upper quadrant. There is no rebound and no guarding.  Soft abdomen, diffuse tenderness in the upper abdomen  Musculoskeletal: Normal range of motion. She exhibits no edema or tenderness.  Moves all extremities well.   Neurological: She is alert and oriented to person, place, and time. She has normal strength. No cranial nerve deficit.  Skin: Skin is warm, dry and intact. No rash noted. No erythema. No pallor.  Psychiatric: She has a  normal mood and affect. Her speech is normal and behavior is normal. Her mood appears  not anxious.  Nursing note and vitals reviewed.    ED Treatments / Results  Labs (all labs ordered are listed, but only abnormal results are displayed) Results for orders placed or performed during the hospital encounter of 03/20/18  Lipase, blood  Result Value Ref Range   Lipase 27 11 - 51 U/L  Comprehensive metabolic panel  Result Value Ref Range   Sodium 135 135 - 145 mmol/L   Potassium 4.2 3.5 - 5.1 mmol/L   Chloride 100 (L) 101 - 111 mmol/L   CO2 24 22 - 32 mmol/L   Glucose, Bld 120 (H) 65 - 99 mg/dL   BUN 14 6 - 20 mg/dL   Creatinine, Ser 1.02 (H) 0.44 - 1.00 mg/dL   Calcium 9.6 8.9 - 10.3 mg/dL   Total Protein 7.6 6.5 - 8.1 g/dL   Albumin 4.2 3.5 - 5.0 g/dL   AST 24 15 - 41 U/L   ALT 17 14 - 54 U/L   Alkaline Phosphatase 55 38 - 126 U/L   Total Bilirubin 1.3 (H) 0.3 - 1.2 mg/dL   GFR calc non Af Amer 51 (L) >60 mL/min   GFR calc Af Amer 59 (L) >60 mL/min   Anion gap 11 5 - 15  CBC  Result Value Ref Range   WBC 10.8 (H) 4.0 - 10.5 K/uL   RBC 5.21 (H) 3.87 - 5.11 MIL/uL   Hemoglobin 15.9 (H) 12.0 - 15.0 g/dL   HCT 47.9 (H) 36.0 - 46.0 %   MCV 91.9 78.0 - 100.0 fL   MCH 30.5 26.0 - 34.0 pg   MCHC 33.2 30.0 - 36.0 g/dL   RDW 12.7 11.5 - 15.5 %   Platelets 221 150 - 400 K/uL  Urinalysis, Routine w reflex microscopic  Result Value Ref Range   Color, Urine YELLOW YELLOW   APPearance CLEAR CLEAR   Specific Gravity, Urine 1.020 1.005 - 1.030   pH 5.0 5.0 - 8.0   Glucose, UA NEGATIVE NEGATIVE mg/dL   Hgb urine dipstick NEGATIVE NEGATIVE   Bilirubin Urine NEGATIVE NEGATIVE   Ketones, ur NEGATIVE NEGATIVE mg/dL   Protein, ur NEGATIVE NEGATIVE mg/dL   Nitrite NEGATIVE NEGATIVE   Leukocytes, UA MODERATE (A) NEGATIVE   RBC / HPF 0-5 0 - 5 RBC/hpf   WBC, UA 6-30 0 - 5 WBC/hpf   Bacteria, UA NONE SEEN NONE SEEN   Squamous Epithelial / LPF NONE SEEN NONE SEEN   Mucus PRESENT    Laboratory interpretation all normal except some elevation of the hemoglobin which she  states is chronically in the 15 range, minimal leukocytosis, ? UTI  ]  EKG EKG Interpretation  Date/Time:  Thursday March 20 2018 20:18:55 EDT Ventricular Rate:  106 PR Interval:  152 QRS Duration: 76 QT Interval:  338 QTC Calculation: 448 R Axis:   15 Text Interpretation:  Sinus tachycardia Nonspecific ST and T wave abnormality Since last tracing rate faster 17 Jan 2014 Confirmed by Rolland Porter [phone]) on 03/20/2018 11:03:20 PM   Radiology Ct Abdomen Pelvis W Contrast  Result Date: 03/21/2018 CLINICAL DATA:  Acute onset of generalized weakness and diarrhea. EXAM: CT ABDOMEN AND PELVIS WITH CONTRAST TECHNIQUE: Multidetector CT imaging of the abdomen and pelvis was performed using the standard protocol following bolus administration of intravenous contrast. CONTRAST:  177mL ISOVUE-300 IOPAMIDOL (ISOVUE-300) INJECTION 61% COMPARISON:  Renal ultrasound performed 05/13/2007 FINDINGS: Lower chest: Scattered  small bilateral pulmonary nodules are noted, measuring up to 5 mm in size. The visualized portions of the mediastinum are unremarkable. Hepatobiliary: The liver is unremarkable in appearance. The gallbladder is unremarkable in appearance. The common bile duct remains normal in caliber. Pancreas: The pancreas is within normal limits. Spleen: The spleen is unremarkable in appearance. Adrenals/Urinary Tract: The adrenal glands are unremarkable in appearance. The kidneys are within normal limits. There is no evidence of hydronephrosis. No renal or ureteral stones are identified. No perinephric stranding is seen. Stomach/Bowel: There is a mildly irregular appearance to the pylorus. Underlying ulceration cannot be entirely excluded. If the patient's symptoms persist, endoscopy could be considered for further evaluation. The stomach is otherwise unremarkable. The small bowel is within normal limits. The appendix is normal in caliber, without evidence of appendicitis. Scattered diverticulosis is noted along  the sigmoid colon, without evidence of diverticulitis. Vascular/Lymphatic: Scattered calcification is seen along the abdominal aorta and its branches. The abdominal aorta is otherwise grossly unremarkable. The inferior vena cava is grossly unremarkable. No retroperitoneal lymphadenopathy is seen. No pelvic sidewall lymphadenopathy is identified. Reproductive: The bladder is mildly distended and grossly unremarkable. The patient is status post hysterectomy. No suspicious adnexal masses are seen. Other: No additional soft tissue abnormalities are seen. Musculoskeletal: No acute osseous abnormalities are identified. The patient's left hip arthroplasty is unremarkable in appearance, though incompletely imaged. Mild degenerative change is noted along the lower thoracic and upper lumbar spine. The visualized musculature is unremarkable in appearance. IMPRESSION: 1. No definite acute abnormality seen to explain the patient's symptoms. 2. Mildly irregular appearance to the pylorus. Underlying ulceration cannot be entirely excluded. If the patient's symptoms persist, endoscopy could be considered for further evaluation. 3. Scattered small bilateral pulmonary nodules measure up to 5 mm in size. No follow-up needed if patient is low-risk (and has no known or suspected primary neoplasm). Non-contrast chest CT can be considered in 12 months if patient is high-risk. This recommendation follows the consensus statement: Guidelines for Management of Incidental Pulmonary Nodules Detected on CT Images: From the Fleischner Society 2017; Radiology 2017; 284:228-243. 4. Scattered diverticulosis along the sigmoid colon, without evidence of diverticulitis. Aortic Atherosclerosis (ICD10-I70.0). Electronically Signed   By: Garald Balding M.D.   On: 03/21/2018 02:05    Procedures Procedures (including critical care time)  Medications Ordered in ED Medications  sodium chloride 0.9 % bolus 1,000 mL (has no administration in time range)   sodium chloride 0.9 % bolus 1,000 mL (0 mLs Intravenous Stopped 03/21/18 0042)  ondansetron (ZOFRAN) injection 4 mg (4 mg Intravenous Given 03/20/18 2356)  fentaNYL (SUBLIMAZE) injection 50 mcg (50 mcg Intravenous Given 03/20/18 2357)  dicyclomine (BENTYL) injection 20 mg (20 mg Intramuscular Given 03/20/18 2359)  loperamide (IMODIUM) capsule 4 mg (4 mg Oral Given 03/20/18 2358)  famotidine (PEPCID) IVPB 20 mg premix (0 mg Intravenous Stopped 03/21/18 0042)  iopamidol (ISOVUE-300) 61 % injection 100 mL (100 mLs Intravenous Contrast Given 03/21/18 0127)  alum & mag hydroxide-simeth (MAALOX/MYLANTA) 200-200-20 MG/5ML suspension 15 mL (15 mLs Oral Given 03/21/18 0221)     Initial Impression / Assessment and Plan / ED Course  I have reviewed the triage vital signs and the nursing notes.  Pertinent labs & imaging results that were available during my care of the patient were reviewed by me and considered in my medical decision making (see chart for details).    Patient was given IV fluids for dehydration, IV Pepcid for her complaints of reflux symptoms, IV nausea  medication, IV pain medication and she was given Bentyl IM for pain.  She was also given oral Imodium.  We discussed doing a CT scan to make sure she does not have colitis or some other underlying etiology going on and she initially refused.  23:40 nursing staff came to me and states daughter had come to the desk and stated her mother was now agreeable to get a CT scan, it was ordered.  12:15 AM nurse informs me that patient now is refusing CT scan of the abdomen/pelvis again.  Patient then agreed to have the CT done.  Recheck it to 18 a.m. patient states "I am about the same".  However she denies any nausea, vomiting, or diarrhea since she got her medications. She is still c/o feeling she is choking, but is in NAD.  Her daughter states she has noticed that patient has been having trouble swallowing certain foods such as spaghetti for a while.   She states she is taking her Protonix on a regular basis.  We discussed her CT scan which showed a possible ulcer of the pylorus.  We are going to increase her Protonix to twice a day for couple weeks.  She states she has been to [facility] in the past.  When asked if she wanted to follow-up with them again or see someone locally she states "I will make up my mind later". She c/o GERD a lot. She was given oral maalox At this point patient seems improved for discharge.   UA was ? For UTI, urine culture was sent. Her symptoms do not sound like they are from a UTI so antibiotics were not started.    Final Clinical Impressions(s) / ED Diagnoses   Final diagnoses:  Dysphagia, unspecified type  PUD (peptic ulcer disease)  Nausea  Diarrhea, unspecified type    ED Discharge Orders        Ordered    ondansetron (ZOFRAN) 4 MG tablet  Every 8 hours PRN     03/21/18 0255     Plan discharge  Rolland Porter, MD, Barbette Or, MD 03/21/18 [phone]

## 2018-03-21 NOTE — ED Notes (Signed)
Pt stated she does not want second bag of fluids or the CT scan.

## 2018-03-21 NOTE — ED Notes (Signed)
Pt sats dropped to 89%. Pt placed on 2L [place] 02.

## 2018-03-21 NOTE — Discharge Instructions (Addendum)
Drink plenty of fluids (clear liquids) then start a bland diet later this morning such as toast, crackers, jello, [employer] chicken noodle soup. Use the zofran for nausea or vomiting. Take imodium OTC for diarrhea. Avoid milk products until the diarrhea is gone.  Increase your Protonix to twice day for the next 2-3 weeks then down to once a day. You can take Maalox or Mylanta for acid reflux between doses of the Protonix. Do not take it more than 2-3 times a day or it can cause diarrhea. Look at the GERD diet instructions.  You need to see a gastroenterologist to evaluate your problems swallowing certain foods and for a possible ulcer in your pylorus (stomach). You can call [facility] or call Dr Olevia Perches office for an appointment.

## 2018-03-22 LAB — URINE CULTURE
Culture: NO GROWTH
SPECIAL REQUESTS: NORMAL

## 2018-03-31 ENCOUNTER — Ambulatory Visit: Payer: PPO | Admitting: Physician Assistant

## 2018-03-31 ENCOUNTER — Encounter: Payer: Self-pay | Admitting: Physician Assistant

## 2018-03-31 ENCOUNTER — Encounter (INDEPENDENT_AMBULATORY_CARE_PROVIDER_SITE_OTHER): Payer: Self-pay

## 2018-03-31 VITALS — BP 138/74 | HR 64 | Ht 68.0 in | Wt 157.0 lb

## 2018-03-31 DIAGNOSIS — Z8719 Personal history of other diseases of the digestive system: Secondary | ICD-10-CM

## 2018-03-31 DIAGNOSIS — R197 Diarrhea, unspecified: Secondary | ICD-10-CM | POA: Diagnosis not present

## 2018-03-31 DIAGNOSIS — K219 Gastro-esophageal reflux disease without esophagitis: Secondary | ICD-10-CM | POA: Diagnosis not present

## 2018-03-31 DIAGNOSIS — R1013 Epigastric pain: Secondary | ICD-10-CM

## 2018-03-31 DIAGNOSIS — Z86010 Personal history of colonic polyps: Secondary | ICD-10-CM

## 2018-03-31 MED ORDER — PANTOPRAZOLE SODIUM 40 MG PO TBEC: 40.0000 mg | DELAYED_RELEASE_TABLET | Freq: Two times a day (BID) | ORAL | 3 refills | Status: DC

## 2018-03-31 MED ORDER — SUCRALFATE 1 GM/10ML PO SUSP: ORAL | 1 refills | Status: DC

## 2018-03-31 NOTE — Patient Instructions (Addendum)
Stay off aspirin. Stay off Mobic.  Tylenol is ok to take.   We have sent the following medications to your pharmacy for you to pick up at your convenience: [place] 1. Pantoprazole sodium 40 mg. ( Protonix) 2. Carafate suspension  Call us back in 7-10 days if no better. You may need an Endoscopy.  We made you a follow up appointment for 04-24-2018 at 10:30 am with [facility].   If you are age 81 or older, your body mass index should be between 23-30. Your Body mass index is 23.87 kg/m. If this is out of the aforementioned range listed, please consider follow up with your Primary Care Provider.

## 2018-03-31 NOTE — Progress Notes (Addendum)
Subjective:    Patient ID: Christy Richardson, female    DOB: 05/22/1935, 83 y.o.   MRN: [id number]  HPI [name] is an 83 year old white female, known to Dr. Carlean Purl.  She has history of hypertension, bronchiectasis, osteoarthritis, anxiety and depression, history of adenomatous colon polyps, GERD and IBS. She had undergone procedures last in 2013.  EGD showed a tortuous esophagus and otherwise normal exam.  Colonoscopy with finding of 2 5 mm sessile polyps, mild diverticulosis and decreased anal tone.  Biopsies consistent with adenomas and she was indicated for 5-year interval follow-up. She comes in today after recent ER visit on 03/21/2018 when she presented with acute onset of abdominal pain diarrhea and chills.  She underwent CT of the abdomen and pelvis on 03/21/2018 showing a mildly irregular pylorus cannot rule out ulceration, showed scattered pulmonary nodules with statement that she did not need follow-up if low risk.  She also had scattered diverticulosis without diverticulitis. Labs with WBC of 10.8 hemoglobin 15.9 LFTs within normal limits except T bili of 1.3. She was placed on a full liquid brat type diet. She says she continues to feel poorly in general and has been very frustrated because she does not know what to eat.  She says she has had very little to eat and feels weak.  She has not had nausea or vomiting but complains of a gnawing empty sensation and bloating in her upper abdomen.  She had diarrhea for about 24 hours total and this resolved.  She had 2 episodes of diarrhea this morning and that was the first time she had diarrhea since the ER visit. She has been taking baby aspirin once daily, she had been on Mobic chronically but stopped that about a week ago.  She is also on Protonix 40 mg daily chronically for GERD and increase that to twice daily over the past week. She has not had any melena or hematochezia.  Review of Systems Pertinent positive and negative review of systems were  noted in the above HPI section.  All other review of systems was otherwise negative.  Outpatient Encounter Medications as of 03/31/2018  Medication Sig  . Alpha-D-Galactosidase (BEANO) TABS Take 1 tablet by mouth as needed (for gas).   . ALPRAZolam (XANAX) 1 MG tablet Take 1 tablet (1 mg total) by mouth at bedtime as needed.  Marland Kitchen amLODipine (NORVASC) 5 MG tablet TAKE (1) TABLET BY MOUTH DAILY AFTER BREAKFAST.  Marland Kitchen aspirin 81 MG tablet Take 81 mg by mouth daily.    . brimonidine (ALPHAGAN) 0.2 % ophthalmic solution Place 1 drop into both eyes 2 (two) times daily. Place 1 drop into both eyes 2 (two) times daily.  . cetirizine (ZYRTEC) 10 MG tablet Take 10 mg by mouth daily.   . cyclobenzaprine (FLEXERIL) 10 MG tablet Take 10 mg by mouth 3 (three) times daily as needed for muscle spasms.  . dorzolamide (TRUSOPT) 2 % ophthalmic solution Place 1 drop into both eyes 2 (two) times daily. Place 1 drop into both eyes 2 (two) times daily.  . fluticasone-salmeterol (ADVAIR HFA) 115-21 MCG/ACT inhaler Inhale 2 puffs into the lungs 2 (two) times daily.  Marland Kitchen ipratropium (ATROVENT) 0.03 % nasal spray Place 2 sprays into both nostrils every 12 (twelve) hours.  Marland Kitchen levothyroxine (SYNTHROID, LEVOTHROID) 50 MCG tablet Take 50 mcg by mouth daily before breakfast.  . LORazepam (ATIVAN) 0.5 MG tablet Take 0.5 mg by mouth 2 (two) times daily as needed (tremors).  . losartan (COZAAR) 100 MG  tablet Take 0.5 tablets (50 mg total) by mouth 2 (two) times daily.  . meloxicam (MOBIC) 15 MG tablet Take 15 mg by mouth daily.  . metoprolol succinate (TOPROL-XL) 50 MG 24 hr tablet Take 75 mg by mouth every evening. Take 1.5 tablets by mouth every evening. Take with or immediately following a meal.   . Multiple Vitamins-Minerals (MULTIVITAMIN WITH MINERALS) tablet Take 1 tablet by mouth daily.    . ondansetron (ZOFRAN) 4 MG tablet Take 1 tablet (4 mg total) by mouth every 8 (eight) hours as needed.  . pantoprazole (PROTONIX) 40 MG tablet  Take 1 tablet (40 mg total) by mouth 2 (two) times daily.  . pravastatin (PRAVACHOL) 40 MG tablet Take 1 tablet (40 mg total) by mouth at bedtime.  Marland Kitchen Respiratory Therapy Supplies (FLUTTER) DEVI Use as directed.  . sertraline (ZOLOFT) 100 MG tablet Take 1 tablet (100 mg total) by mouth at bedtime. 2 at bedtime  . sodium chloride (OCEAN NASAL SPRAY) 0.65 % nasal spray Place 2 sprays into both nostrils at bedtime. Use as needed  . traMADol (ULTRAM) 50 MG tablet Take 50 mg by mouth 2 (two) times daily as needed (pain).   . traZODone (DESYREL) 50 MG tablet Take 50 mg by mouth at bedtime.    . triamcinolone (NASACORT ALLERGY 24HR) 55 MCG/ACT AERO nasal inhaler Place 1 spray into the nose daily.  . [DISCONTINUED] pantoprazole (PROTONIX) 40 MG tablet Take 40 mg by mouth daily.   . sucralfate (CARAFATE) 1 GM/10ML suspension Take 1 gram between meals and at bedtime.   No facility-administered encounter medications on file as of 03/31/2018.    Allergies  Allergen Reactions  . Penicillins     REACTION: Rash  . Prednisone     REACTION: Increase eye pressure with gloucoma. Broke out in rash after injection per Dr. Percell Miller  . Sulfonamide Derivatives     REACTION: Rash - not sure   Patient Active Problem List   Diagnosis Date Noted  . Sinus tachycardia 02/04/2018  . Hyperlipidemia LDL goal <70 08/16/2015  . Left ventricular diastolic dysfunction, NYHA class 1 03/13/2014  . Chest pain 01/30/2014  . Tremor, essential 05/07/2013  . Fecal incontinence 09/30/2012  . Pulmonary nodules 05/15/2012  . Dyspnea 05/27/2011  . Bronchiectasis without acute exacerbation ([place]) 09/26/2009  . OSTEOPENIA 03/17/2009  . COLONIC POLYPS, ADENOMATOUS, HX OF 09/27/2008  . BACK PAIN 09/01/2008  . LIPOMA 06/01/2008  . UTI'S, RECURRENT 06/10/2007  . Hypothyroidism 01/07/2007  . Hyperlipidemia 01/07/2007  . Anxiety 01/07/2007  . DEPRESSION 01/07/2007  . MIGRAINE HEADACHE 01/07/2007  . GLAUCOMA NEC 01/07/2007  .  DECREASED HEARING 01/07/2007  . Essential hypertension 01/07/2007  . Allergic rhinitis 01/07/2007  . GERD 01/07/2007  . IBS 01/07/2007  . OSTEOARTHRITIS 01/07/2007   Social History   Socioeconomic History  . Marital status: Widowed    Spouse name: Not on file  . Number of children: 2  . Years of education: 10th grade  . Highest education level: Not on file  Occupational History  . Occupation: Retired    Comment: Lexicographer  . Financial resource strain: Not on file  . Food insecurity:    Worry: Not on file    Inability: Not on file  . Transportation needs:    Medical: Not on file    Non-medical: Not on file  Tobacco Use  . Smoking status: Never Smoker  . Smokeless tobacco: Never Used  Substance and Sexual Activity  . Alcohol use:  No    Alcohol/week: 0.0 oz  . Drug use: No  . Sexual activity: Not on file  Lifestyle  . Physical activity:    Days per week: Not on file    Minutes per session: Not on file  . Stress: Not on file  Relationships  . Social connections:    Talks on phone: Not on file    Gets together: Not on file    Attends religious service: Not on file    Active member of club or organization: Not on file    Attends meetings of clubs or organizations: Not on file    Relationship status: Not on file  . Intimate partner violence:    Fear of current or ex partner: Not on file    Emotionally abused: Not on file    Physically abused: Not on file    Forced sexual activity: Not on file  Other Topics Concern  . Not on file  Social History Narrative   Widowed since 2002. Lives with her Daughter.    Ms. Bartley family history includes Asthma in her daughter; Colon cancer in her paternal grandfather; Coronary artery disease in her mother; Diabetes in her father; Prostate cancer in her brother; Scoliosis in her daughter; Throat cancer in her mother.      Objective:    Vitals:   03/31/18 1457  BP: 138/74  Pulse: 64    Physical Exam; well  developed elderly white female in no acute distress, pleasant accompanied by her daughter blood pressure 138/74 pulse 70.  Height 5 foot 8, weight 157, BMI 23.8.  HEENT; nontraumatic normocephalic EOMI PERRLA sclera anicteric, Cardiovascular; regular rate and rhythm with S1-S2 no murmur rub or gallop, Pulmonary; clear bilaterally, Abdomen ;soft, bowel sounds are present to some mild tenderness in the epigastrium and hypogastrium no palpable mass or hepatosplenomegaly, Rectal ;exam not done, Extremities ;no clubbing cyanosis or edema skin warm dry, Neuro psych ;mood and affect appropriate       Assessment & Plan:   #65 83 year old white female with onset about 10 days ago of upper abdominal discomfort described as a gnawing and bloating type sensation, this was associated with diarrhea which lasted for about 24 hours and then resolved.  Patient had work-up in the emergency room with CT imaging showing a mildly irregular pylorus could not rule out ulceration and labs were unrevealing. She has improved as her diarrhea has resolved.  She did have one episode this morning. She has an increased risk for gastropathy and peptic ulcer disease with aspirin and Mobic use.  I suspect she may have a pyloric ulcer  #2 chronic GERD #3 history of IBS #4 history of adenomatous colon polyps-last colonoscopy September 2013, due for follow-up #5 bronchiectasis 6 hypertension 7 anxiety/depression  Plan; we had a long talk today about advancing her diet and branching out on oral intake.  She is advised to eat smaller more frequent meals 4-5 times daily and avoid very greasy or spicy foods and alcohol. Continue 40 mg Protonix twice daily x2 to 3 months, new prescription sent Add Carafate suspension 1 g between meals and at bedtime x1 month Stop Mobic and stay off baby aspirin for now She will call back in 7 to 10 days, she has not had significant improvement in her symptoms she may need EGD with Dr. Carlean Purl. She will  follow-up with Dr. Carlean Purl more self in 1 month.  We can decide on timing of follow-up colonoscopy when she is over her acute  illness.  Ibrahim Mcpheeters S Anjannette Gauger PA-C 03/31/2018   Cc: Celene Squibb, MD  Agree with Ms. Genia Harold assessment and plan. Gatha Mayer, MD, [facility]

## 2018-04-09 ENCOUNTER — Encounter: Payer: Self-pay | Admitting: Pulmonary Disease

## 2018-04-09 ENCOUNTER — Ambulatory Visit: Payer: PPO | Admitting: Pulmonary Disease

## 2018-04-09 VITALS — BP 140/78 | HR 71 | Ht 68.0 in | Wt 156.0 lb

## 2018-04-09 DIAGNOSIS — R5381 Other malaise: Secondary | ICD-10-CM | POA: Diagnosis not present

## 2018-04-09 DIAGNOSIS — R0603 Acute respiratory distress: Secondary | ICD-10-CM | POA: Diagnosis not present

## 2018-04-09 DIAGNOSIS — J479 Bronchiectasis, uncomplicated: Secondary | ICD-10-CM | POA: Diagnosis not present

## 2018-04-09 MED ORDER — FLUTTER DEVI: 1.0000 | Freq: Once | 0 refills | Status: AC

## 2018-04-09 NOTE — Progress Notes (Signed)
Subjective:    Patient ID: Christy Richardson, female    DOB: 07/15/1935, 83 y.o.   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  She switched care from [facility] to [facility] (Pulmonary) in 05/2012.  Her simple spirometry in 2013 was 78% predicted.  HPI Chief Complaint  Patient presents with  . Follow-up    6 week ROV, reports feeling short of breath all the time    Christy Richardson has been taking a medicine for a newly diagnosed ulcer.  She says that she was diagnosed with an ulcer.  She saw Christy Richardson upstairs and they put her on protonix twice a day, carafate and stop NSAIDS.  Recenetly she had her metoprolol and losartan adjusted.  Her blood pressure remains elevated.  Her pulse has been elevated form time to time as well.    Past Medical History:  Diagnosis Date  . Adenomatous polyp of colon 03/2007  . Allergic rhinitis   . Anxiety   . Blood transfusion   . Bronchiectasis ([place])   . Depression   . Diabetes mellitus without complication ([place])   . Diverticulosis of colon (without mention of hemorrhage)   . DOE (dyspnea on exertion)   . Fatigue   . Fecal incontinence   . GERD (gastroesophageal reflux disease)   . Glaucoma   . Goiter   . Heart murmur   . Hyperlipidemia   . Hypertension   . Hypothyroidism   . IBS (irritable bowel syndrome)   . Migraine   . Mitral valve prolapse   . Neck mass   . Nonspecific abnormal electrocardiogram (ECG) (EKG)   . Palpitations   . Polyp, sigmoid colon   . Pulmonary nodules   . Recurrent UTI   . Tremor      Review of Systems  Constitutional: Negative for chills, fatigue and fever.  HENT: Negative for postnasal drip, rhinorrhea and sinus pressure.   Respiratory: Positive for shortness of breath. Negative for cough and wheezing.   Cardiovascular: Negative for chest pain, palpitations and leg swelling.  4    Objective:   Physical Exam  Vitals:   04/09/18 1425   BP: 140/78  Pulse: 71  SpO2: 96%  Weight: 156 lb (70.8 kg)  Height: 5\' 8"  (1.727 m)   RA  Gen: well appearing HENT: OP clear, TM's clear, neck supple PULM: Crackles L base B, normal percussion CV: RRR, no mgr, trace edema GI: BS+, soft, nontender Derm: no cyanosis or rash Psyche: normal mood and affect   PFT: 05/2012 simple spirometry> > Ratio 63%, FEV1 1.98L (78% pred) 05/05/2015 simple spirometry> ratio 60%, FEV1 1.79 L (77% predicted). March 2019 ratio 61%, FEV1 1.92 L 86% predicted, FVC 3.2 L 106% predicted, total lung capacity 5.5 L 100% predicted, DLCO 14.5 mL 51% predicted  Chest imaging: 11/2012 CT chest [facility] >> bilateral bronchiectasis, scattered nodules no greater than 36mm in size March 2015 simple spirometry > ratio 62%, FEV1 1.87 L (79% predicted) February 2015 chest x-ray images reviewed: Elevated right hemidiaphragm, some chronic bronchitic changes, otherwise clear March 2019 chest x-ray images independently reviewed, hyperinflation noted, small pulmonary nodule stable.     Assessment & Plan:    Bronchiectasis without complication [facility])  Physical deconditioning  Discussion: Christy Richardson had lung function testing and a chest x-ray which did not show significant evidence of worsening disease.  I explained to her today that I think the predominant cause of her shortness of breath  is physical deconditioning.  Certainly her bronchiectasis contributes, the recent diagnosis of a likely peptic ulcer contributes, and to some degree her hypertension likely contributes.  However, none of these are the specific cause of her dyspnea.  I am pleased that her lung function testing has not worsened.  She really needs to exercise more.  Plan: Physical deconditioning: We will refer you to pulmonary rehabilitation with Dr. Kandis Richardson with [facility]  Bronchiectasis: Keep using albuterol twice a day to help clear out mucus Use the flutter valve 4 to 5 breaths, 4-5 times a  day Keep taking Advair 2 puffs twice a day no matter how you feel  We will see you back in 3-4 months or sooner if needed    Current Outpatient Medications:  .  Alpha-D-Galactosidase (BEANO) TABS, Take 1 tablet by mouth as needed (for gas). , Disp: , Rfl:  .  ALPRAZolam (XANAX) 1 MG tablet, Take 1 tablet (1 mg total) by mouth at bedtime as needed., Disp: , Rfl:  .  aspirin 81 MG tablet, Take 81 mg by mouth daily.  , Disp: , Rfl:  .  brimonidine (ALPHAGAN) 0.2 % ophthalmic solution, Place 1 drop into both eyes 2 (two) times daily. Place 1 drop into both eyes 2 (two) times daily., Disp: , Rfl:  .  cetirizine (ZYRTEC) 10 MG tablet, Take 10 mg by mouth daily. , Disp: , Rfl:  .  cyclobenzaprine (FLEXERIL) 10 MG tablet, Take 10 mg by mouth 3 (three) times daily as needed for muscle spasms., Disp: , Rfl:  .  dorzolamide (TRUSOPT) 2 % ophthalmic solution, Place 1 drop into both eyes 2 (two) times daily. Place 1 drop into both eyes 2 (two) times daily., Disp: , Rfl:  .  fluticasone-salmeterol (ADVAIR HFA) 115-21 MCG/ACT inhaler, Inhale 2 puffs into the lungs 2 (two) times daily., Disp: 1 Inhaler, Rfl: 11 .  ipratropium (ATROVENT) 0.03 % nasal spray, Place 2 sprays into both nostrils every 12 (twelve) hours., Disp: 30 mL, Rfl: 12 .  levothyroxine (SYNTHROID, LEVOTHROID) 50 MCG tablet, Take 50 mcg by mouth daily before breakfast., Disp: , Rfl:  .  LORazepam (ATIVAN) 0.5 MG tablet, Take 0.5 mg by mouth 2 (two) times daily as needed (tremors)., Disp: , Rfl:  .  losartan (COZAAR) 100 MG tablet, Take 0.5 tablets (50 mg total) by mouth 2 (two) times daily., Disp: 90 tablet, Rfl: 3 .  meloxicam (MOBIC) 15 MG tablet, Take 15 mg by mouth daily., Disp: , Rfl:  .  metoprolol succinate (TOPROL-XL) 50 MG 24 hr tablet, Take 100 mg by mouth every evening. Take 1.5 tablets by mouth every evening. Take with or immediately following a meal. , Disp: , Rfl:  .  Multiple Vitamins-Minerals (MULTIVITAMIN WITH MINERALS) tablet,  Take 1 tablet by mouth daily.  , Disp: , Rfl:  .  ondansetron (ZOFRAN) 4 MG tablet, Take 1 tablet (4 mg total) by mouth every 8 (eight) hours as needed., Disp: 6 tablet, Rfl: 0 .  pantoprazole (PROTONIX) 40 MG tablet, Take 1 tablet (40 mg total) by mouth 2 (two) times daily., Disp: 60 tablet, Rfl: 3 .  pravastatin (PRAVACHOL) 40 MG tablet, Take 1 tablet (40 mg total) by mouth at bedtime., Disp: , Rfl:  .  Respiratory Therapy Supplies (FLUTTER) DEVI, Use as directed., Disp: 1 each, Rfl: 0 .  sertraline (ZOLOFT) 100 MG tablet, Take 1 tablet (100 mg total) by mouth at bedtime. 2 at bedtime, Disp: , Rfl:  .  sodium  chloride (OCEAN NASAL SPRAY) 0.65 % nasal spray, Place 2 sprays into both nostrils at bedtime. Use as needed, Disp: , Rfl:  .  sucralfate (CARAFATE) 1 GM/10ML suspension, Take 1 gram between meals and at bedtime., Disp: 420 mL, Rfl: 1 .  traMADol (ULTRAM) 50 MG tablet, Take 50 mg by mouth 2 (two) times daily as needed (pain). , Disp: , Rfl:  .  traZODone (DESYREL) 50 MG tablet, Take 50 mg by mouth at bedtime.  , Disp: , Rfl:  .  triamcinolone (NASACORT ALLERGY 24HR) 55 MCG/ACT AERO nasal inhaler, Place 1 spray into the nose daily., Disp: , Rfl:  .  amLODipine (NORVASC) 5 MG tablet, TAKE (1) TABLET BY MOUTH DAILY AFTER BREAKFAST. (Patient not taking: Reported on 04/09/2018), Disp: 90 tablet, Rfl: 2

## 2018-04-09 NOTE — Addendum Note (Signed)
Addended by: Dolores Lory on: 04/09/2018 02:56 PM   Modules accepted: Orders

## 2018-04-09 NOTE — Patient Instructions (Signed)
Physical deconditioning: We will refer you to pulmonary rehabilitation with Dr. Kandis Mannan with [facility]  Bronchiectasis: Keep using albuterol twice a day to help clear out mucus Use the flutter valve 4 to 5 breaths, 4-5 times a day Keep taking Advair 2 puffs twice a day no matter how you feel  We will see you back in 3-4 months or sooner if needed

## 2018-04-10 ENCOUNTER — Telehealth: Payer: Self-pay | Admitting: Cardiovascular Disease

## 2018-04-10 DIAGNOSIS — I10 Essential (primary) hypertension: Secondary | ICD-10-CM | POA: Diagnosis not present

## 2018-04-10 DIAGNOSIS — R109 Unspecified abdominal pain: Secondary | ICD-10-CM | POA: Diagnosis not present

## 2018-04-10 DIAGNOSIS — R7301 Impaired fasting glucose: Secondary | ICD-10-CM | POA: Diagnosis not present

## 2018-04-10 DIAGNOSIS — E875 Hyperkalemia: Secondary | ICD-10-CM | POA: Diagnosis not present

## 2018-04-10 DIAGNOSIS — J479 Bronchiectasis, uncomplicated: Secondary | ICD-10-CM | POA: Diagnosis not present

## 2018-04-10 DIAGNOSIS — M799 Soft tissue disorder, unspecified: Secondary | ICD-10-CM | POA: Diagnosis not present

## 2018-04-10 DIAGNOSIS — M542 Cervicalgia: Secondary | ICD-10-CM | POA: Diagnosis not present

## 2018-04-10 DIAGNOSIS — R2240 Localized swelling, mass and lump, unspecified lower limb: Secondary | ICD-10-CM | POA: Diagnosis not present

## 2018-04-10 DIAGNOSIS — E782 Mixed hyperlipidemia: Secondary | ICD-10-CM | POA: Diagnosis not present

## 2018-04-10 DIAGNOSIS — J060 Acute laryngopharyngitis: Secondary | ICD-10-CM | POA: Diagnosis not present

## 2018-04-10 DIAGNOSIS — K219 Gastro-esophageal reflux disease without esophagitis: Secondary | ICD-10-CM | POA: Diagnosis not present

## 2018-04-10 DIAGNOSIS — E039 Hypothyroidism, unspecified: Secondary | ICD-10-CM | POA: Diagnosis not present

## 2018-04-10 NOTE — Telephone Encounter (Signed)
Pt c/o BP issue: STAT if pt c/o blurred vision, one-sided weakness or slurred speech  1. What are your last 5 BP readings?  Today at 8am 169/100 That is all she has with her   2. Are you having any other symptoms (ex. Dizziness, headache, blurred vision, passed out)? Right leg is swollen into her knee.   3. What is your BP issue? elevated

## 2018-04-10 NOTE — Telephone Encounter (Signed)
I spoke with the patient regarding her BP readings and swelling to her right leg.   Per the patient, BP/ HR readings have been: 165/90 (79)- AM & 161/93 (79)- PM on 5/7 169/100 (82) this morning.  Clarified with the patient that she is currently taking: Metoprolol succinate 50 mg- 2 tablets (100 mg) at night Losartan 100 mg- 1/2 tablet (50 mg) at night  She confirms she is not taking amlodipine on a daily basis. She states when he BP is elevated, she will take an extra 1/2 tablet (50 mg) of losartan. She states she does have amlodipine at home. I inquired as to why she was not taking amlodipine and she reported that she thought Dr. Rockey Situ only told her to take it if needed.  I advised she may need to resume amlodipine 5 mg once daily in the AM since her other meds are at night, but will clarify with Dr. Rockey Situ.  She reports that she is also having trouble with an ulcer and has had pain with this. Her other complaint is right leg swelling, mostly around the knee area, but she does feel her whole leg is swollen.  She denies that the area is hot to touch, but she is having tingling in her leg and sore behind her knee.  I advised that I am unsure if she may have a cyst behind her knee, but will also review with Dr. Rockey Situ. The patient states that she is unsure how long she has had the swelling as she has been wearing jeans, but really noticed this yesterday and today.  No swelling to the left leg.

## 2018-04-11 ENCOUNTER — Other Ambulatory Visit (HOSPITAL_COMMUNITY): Payer: Self-pay | Admitting: Internal Medicine

## 2018-04-11 DIAGNOSIS — R2241 Localized swelling, mass and lump, right lower limb: Secondary | ICD-10-CM

## 2018-04-14 MED ORDER — LOSARTAN POTASSIUM 100 MG PO TABS: 100.0000 mg | ORAL_TABLET | Freq: Every day | ORAL | 3 refills | Status: DC

## 2018-04-14 NOTE — Telephone Encounter (Signed)
If I remember correctly she has leg swelling with amlodipine and we may have stopped it for that reason She could increase losartan up to 100 mg daily history of anxiety which could push her pressures up

## 2018-04-14 NOTE — Telephone Encounter (Signed)
Patient verbalized understanding to stay off of amlodipine and to increase losartan to 100 mg once a day at bedtime. She will continue to monitor and call us if this does not improve.

## 2018-04-15 ENCOUNTER — Ambulatory Visit (HOSPITAL_COMMUNITY)
Admission: RE | Admit: 2018-04-15 | Discharge: 2018-04-15 | Disposition: A | Discharge status: Home / Self Care | Payer: PPO | Source: Ambulatory Visit | Attending: Internal Medicine | Admitting: Internal Medicine

## 2018-04-15 DIAGNOSIS — R2241 Localized swelling, mass and lump, right lower limb: Secondary | ICD-10-CM | POA: Diagnosis not present

## 2018-04-15 DIAGNOSIS — R600 Localized edema: Secondary | ICD-10-CM | POA: Diagnosis not present

## 2018-04-16 NOTE — Telephone Encounter (Signed)
June 4th next available. Is this ok ?

## 2018-04-16 NOTE — Telephone Encounter (Addendum)
BP Log   5/10  815 am   148/81  92  5/11  8 am    168/100 78  5/12  8 am   162/93  83  5/13  8am   176/98  79  5/14  8 am    193/104 77    9 am  Took meds  169/87  83    630 pm   185/98  74   8 pm    172/90  83     9 pm  Took Meds     10 pm    128/71  81   5/15  4 am    138/84  81   730 am  164/100 86     Please call and advise patient what to do.  Also FYI patient has an ulcer and has had to dc asa for the past month

## 2018-04-16 NOTE — Telephone Encounter (Signed)
Patient states that her blood pressures are still elevated despite increased doses of metoprolol and losartan. She is very concerned and does not want to have a stroke. She also states that she had to stop taking her aspirin due to stomach ulcers. On 04/14/18 we instructed her to increase metoprolol to 100 mg at bedtime and losartan to 100 mg at bedtime as well. Blood pressures remained elevated on 04/15/18 and again this morning. Recommended that she try taking the metoprolol at bedtime and the losartan in the morning to see if that will help. She is really hoping to see someone here in our office to review these concerns with her. Advised that Dr. Rockey Situ has no current appointments available but that I can see if we can get her in to see our APP's. Instructed her to continue monitoring her blood pressure readings and to let us know if they remain elevated and that I would have scheduling give her call. She was appreciative for the call, verbalized understanding of our conversation, and had no further questions at this time.

## 2018-04-17 NOTE — Telephone Encounter (Signed)
Pt only wanted to see Dr Rockey Situ  She is now scheduled for 05/15/18 And has been added to wait list

## 2018-04-24 ENCOUNTER — Ambulatory Visit: Payer: PPO | Admitting: Physician Assistant

## 2018-04-24 ENCOUNTER — Encounter: Payer: Self-pay | Admitting: Physician Assistant

## 2018-04-24 VITALS — BP 98/62 | HR 79 | Ht 68.0 in | Wt 155.0 lb

## 2018-04-24 DIAGNOSIS — R933 Abnormal findings on diagnostic imaging of other parts of digestive tract: Secondary | ICD-10-CM

## 2018-04-24 DIAGNOSIS — K580 Irritable bowel syndrome with diarrhea: Secondary | ICD-10-CM | POA: Diagnosis not present

## 2018-04-24 DIAGNOSIS — R1013 Epigastric pain: Secondary | ICD-10-CM | POA: Diagnosis not present

## 2018-04-24 MED ORDER — PANTOPRAZOLE SODIUM 40 MG PO TBEC: 40.0000 mg | DELAYED_RELEASE_TABLET | Freq: Two times a day (BID) | ORAL | 3 refills | Status: DC

## 2018-04-24 MED ORDER — HYOSCYAMINE SULFATE SL 0.125 MG SL SUBL: SUBLINGUAL_TABLET | SUBLINGUAL | 6 refills | Status: DC

## 2018-04-24 NOTE — Progress Notes (Addendum)
Subjective:    Patient ID: Christy Richardson, female    DOB: [birth year], 83 y.o.   MRN: [id number]  HPI Avalyn is a pleasant 83 year old white female known to Dr. Carlean Purl and myself who was recently seen in the office on 03/31/2018 with 2 to 3-week history of upper abdominal pain described as gnawing and burning associated with some bloating and She had gone to the emergency room on 03/20/2018 and CT imaging was done showing a mildly irregular pylorus question ulceration otherwise unremarkable scan. He had been taking a baby aspirin regularly in addition to Mobic on a daily basis.  She was asked to stop the aspirin and Mobic.  Increased her Protonix to twice daily and she has been on Carafate 4 times daily over the past month and a half.  She was complaining of weakness at the time of her last office visit as well and nausea. Patient says overall she is feeling better.  She is not having any ongoing pain or gnawing at this point but still has some intermittent symptoms.  No nausea or vomiting.  Appetite has been fairly good.  She also still complains of intermittent bloating.  She has remained off of aspirin and Mobic and is doing okay. She also wanted to discuss IBS type symptoms today.  She says chronically she has had some urgency postprandially and loose stools.  At times especially after eating out and sometimes sporadically will have extreme urgency.  She had a prescription for Bentyl in the past but was not sure how much this helped her. She says the Carafate is currently causing constipation.    Her last colonoscopy was done in 2013.  She did have adenomatous polyps.  She has received letter from Dr. Arelia Longest stating she did not need to continue surveillance colonoscopies, and is not interested in follow-up colonoscopy at this time.  Review of Systems Pertinent positive and negative review of systems were noted in the above HPI section.  All other review of systems was otherwise  negative.  Outpatient Encounter Medications as of 04/24/2018  Medication Sig  . Alpha-D-Galactosidase (BEANO) TABS Take 1 tablet by mouth as needed (for gas).   . ALPRAZolam (XANAX) 1 MG tablet Take 1 tablet (1 mg total) by mouth at bedtime as needed.  Marland Kitchen aspirin 81 MG tablet Take 81 mg by mouth daily.    . brimonidine (ALPHAGAN) 0.2 % ophthalmic solution Place 1 drop into both eyes 2 (two) times daily. Place 1 drop into both eyes 2 (two) times daily.  . cetirizine (ZYRTEC) 10 MG tablet Take 10 mg by mouth daily.   . cyclobenzaprine (FLEXERIL) 10 MG tablet Take 10 mg by mouth 3 (three) times daily as needed for muscle spasms.  . dorzolamide (TRUSOPT) 2 % ophthalmic solution Place 1 drop into both eyes 2 (two) times daily. Place 1 drop into both eyes 2 (two) times daily.  . fluticasone-salmeterol (ADVAIR HFA) 115-21 MCG/ACT inhaler Inhale 2 puffs into the lungs 2 (two) times daily.  Marland Kitchen ipratropium (ATROVENT) 0.03 % nasal spray Place 2 sprays into both nostrils every 12 (twelve) hours.  Marland Kitchen levothyroxine (SYNTHROID, LEVOTHROID) 50 MCG tablet Take 50 mcg by mouth daily before breakfast.  . LORazepam (ATIVAN) 0.5 MG tablet Take 0.5 mg by mouth 2 (two) times daily as needed (tremors).  . losartan (COZAAR) 100 MG tablet Take 1 tablet (100 mg total) by mouth daily.  . meloxicam (MOBIC) 15 MG tablet Take 15 mg by mouth daily.  . metoprolol  succinate (TOPROL-XL) 50 MG 24 hr tablet Take 100 mg by mouth every evening. Take 1.5 tablets by mouth every evening. Take with or immediately following a meal.   . Multiple Vitamins-Minerals (MULTIVITAMIN WITH MINERALS) tablet Take 1 tablet by mouth daily.    . ondansetron (ZOFRAN) 4 MG tablet Take 1 tablet (4 mg total) by mouth every 8 (eight) hours as needed.  . pantoprazole (PROTONIX) 40 MG tablet Take 1 tablet (40 mg total) by mouth 2 (two) times daily.  . pravastatin (PRAVACHOL) 40 MG tablet Take 1 tablet (40 mg total) by mouth at bedtime.  Marland Kitchen Respiratory Therapy  Supplies (FLUTTER) DEVI Use as directed.  . sertraline (ZOLOFT) 100 MG tablet Take 1 tablet (100 mg total) by mouth at bedtime. 2 at bedtime  . sodium chloride (OCEAN NASAL SPRAY) 0.65 % nasal spray Place 2 sprays into both nostrils at bedtime. Use as needed  . sucralfate (CARAFATE) 1 GM/10ML suspension Take 1 gram between meals and at bedtime.  . traMADol (ULTRAM) 50 MG tablet Take 50 mg by mouth 2 (two) times daily as needed (pain).   . traZODone (DESYREL) 50 MG tablet Take 50 mg by mouth at bedtime.    . triamcinolone (NASACORT ALLERGY 24HR) 55 MCG/ACT AERO nasal inhaler Place 1 spray into the nose daily.  . [DISCONTINUED] pantoprazole (PROTONIX) 40 MG tablet Take 1 tablet (40 mg total) by mouth 2 (two) times daily.  Marland Kitchen Hyoscyamine Sulfate SL (LEVSIN/SL) 0.125 MG SUBL Take 1 table by mouth twice daily as needed for IBS/diarrhea.   No facility-administered encounter medications on file as of 04/24/2018.    Allergies  Allergen Reactions  . Penicillins     REACTION: Rash  . Prednisone     REACTION: Increase eye pressure with gloucoma. Broke out in rash after injection per Dr. Percell Miller  . Sulfonamide Derivatives     REACTION: Rash - not sure   Patient Active Problem List   Diagnosis Date Noted  . Sinus tachycardia 02/04/2018  . Hyperlipidemia LDL goal <70 08/16/2015  . Left ventricular diastolic dysfunction, NYHA class 1 03/13/2014  . Chest pain 01/30/2014  . Tremor, essential 05/07/2013  . Fecal incontinence 09/30/2012  . Pulmonary nodules 05/15/2012  . Dyspnea 05/27/2011  . Bronchiectasis without acute exacerbation ([place]) 09/26/2009  . OSTEOPENIA 03/17/2009  . COLONIC POLYPS, ADENOMATOUS, HX OF 09/27/2008  . BACK PAIN 09/01/2008  . LIPOMA 06/01/2008  . UTI'S, RECURRENT 06/10/2007  . Hypothyroidism 01/07/2007  . Hyperlipidemia 01/07/2007  . Anxiety 01/07/2007  . DEPRESSION 01/07/2007  . MIGRAINE HEADACHE 01/07/2007  . GLAUCOMA NEC 01/07/2007  . DECREASED HEARING 01/07/2007  .  Essential hypertension 01/07/2007  . Allergic rhinitis 01/07/2007  . GERD 01/07/2007  . IBS 01/07/2007  . OSTEOARTHRITIS 01/07/2007   Social History   Socioeconomic History  . Marital status: Widowed    Spouse name: Not on file  . Number of children: 2  . Years of education: 10th grade  . Highest education level: Not on file  Occupational History  . Occupation: Retired    Comment: Lexicographer  . Financial resource strain: Not on file  . Food insecurity:    Worry: Not on file    Inability: Not on file  . Transportation needs:    Medical: Not on file    Non-medical: Not on file  Tobacco Use  . Smoking status: Never Smoker  . Smokeless tobacco: Never Used  Substance and Sexual Activity  . Alcohol use: No  Alcohol/week: 0.0 oz  . Drug use: No  . Sexual activity: Not on file  Lifestyle  . Physical activity:    Days per week: Not on file    Minutes per session: Not on file  . Stress: Not on file  Relationships  . Social connections:    Talks on phone: Not on file    Gets together: Not on file    Attends religious service: Not on file    Active member of club or organization: Not on file    Attends meetings of clubs or organizations: Not on file    Relationship status: Not on file  . Intimate partner violence:    Fear of current or ex partner: Not on file    Emotionally abused: Not on file    Physically abused: Not on file    Forced sexual activity: Not on file  Other Topics Concern  . Not on file  Social History Narrative   Widowed since 2002. Lives with her Daughter.    Ms. Lalani family history includes Asthma in her daughter; Colon cancer in her paternal grandfather; Coronary artery disease in her mother; Diabetes in her father; Prostate cancer in her brother; Scoliosis in her daughter; Throat cancer in her mother.      Objective:    Vitals:   04/24/18 1028  BP: 98/62  Pulse: 79    Physical Exam; well-developed elderly white female in no  acute distress, accompanied by her daughter, both pleasant blood pressure 98/62 pulse 79, height 5 foot 8, weight 155, BMI 23.5.  HEENT; nontraumatic normocephalic EOMI PERRLA sclera anicteric, Oropharynx clear, Cardiovascular; regular rate and rhythm with S1-S2, Pulmonary ;clear bilaterally, Abdomen ;soft, basically nontender there is no palpable mass or hepatosplenomegaly bowel sounds are present, Rectal exam not done, Extremities; no clubbing cyanosis or edema skin warm dry, Neuro psych ;alert and oriented, grossly nonfocal mood and affect appropriate       Assessment & Plan:   #81 83 year old white female with several week history of epigastric pain described as gnawing associated with bloating and some nausea.  CT imaging showed irregular pylorus question ulceration.  Very likely that she had aspirin/NSAID induced pyloric ulcer or significant inflammation which is now improving off NSAIDs and aspirin and on twice daily PPI and Carafate.  #2 IBS-intermittent urgency and diarrhea #3 history of adenomatous colon polyps-no further surveillance due to age #4 osteoarthritis #5 history of bronchiectasis #6.  Hypertension  Plan; We discussed pursuing EGD versus to need medical management over the next 2 to 3 weeks, and reassessing need for EGD.  She and her daughter would like to give the medication in another 3 weeks.  At that point if she is still having symptoms ,she is asked to call back to speak with Dr. Celesta Aver nurse so she can be scheduled for upper endoscopy.  She Will gradually advance to regular diet Patient is asked to remain off Mobic and aspirin She can stop Carafate Continue Protonix 40 mg p.o. twice daily, refill sent We will give her a trial of Levsin 0.1251 p.o. 2 AM, then twice daily as needed. Have scheduled her a follow-up office visit with Dr. Carlean Purl in about 6 weeks.  Greater than 50% of visit today spent in counseling and coordination of care.  Deriona Altemose S Mayci Haning  PA-C 04/24/2018   Cc: Celene Squibb, MD Agree with Ms. Genia Harold assessment and plan. Gatha Mayer, MD, [facility]

## 2018-04-24 NOTE — Patient Instructions (Addendum)
If you are age 81 or older, your body mass index should be between 23-30. Your Body mass index is 23.57 kg/m. If this is out of the aforementioned range listed, please consider follow up with your Primary Care Provider.  Stay off the Mobic and aspirin.  Stop Carafate.  Call us back in 2-3 weeks to speak with Dr. Celesta Aver nurse about Endoscopy if still having discomfort.   We made you an appointment with Dr. Carlean Purl for 06-20-2018 at 11:30 am.

## 2018-05-09 ENCOUNTER — Telehealth: Payer: Self-pay | Admitting: Physician Assistant

## 2018-05-09 NOTE — Telephone Encounter (Signed)
Patient reports some improvement in her symptoms and describes it as "a little bit better." She is interested in going forward with an EGD. She is not on anti-coagulation medication or oxygen.  Okay to schedule in the [place]?

## 2018-05-09 NOTE — Telephone Encounter (Signed)
Pt called stating that Christy Richardson told her that she could have an egd. Pt would like to speak with her nurse about it. Pls call her

## 2018-05-13 NOTE — Telephone Encounter (Signed)
Spoke with the patient. She has decided she will wait until she sees Dr Carlean Purl for the follow up appointment in July.

## 2018-05-13 NOTE — Telephone Encounter (Signed)
Yes I believe Ok to schedule in [facility]- make sure she is ambulatory .Marland Kitchen I think she is , but not positive .. thanks

## 2018-05-15 ENCOUNTER — Ambulatory Visit: Payer: Self-pay | Admitting: Cardiovascular Disease

## 2018-05-15 ENCOUNTER — Encounter

## 2018-05-26 DIAGNOSIS — X32XXXA Exposure to sunlight, initial encounter: Secondary | ICD-10-CM | POA: Diagnosis not present

## 2018-05-26 DIAGNOSIS — L570 Actinic keratosis: Secondary | ICD-10-CM | POA: Diagnosis not present

## 2018-05-26 DIAGNOSIS — D225 Melanocytic nevi of trunk: Secondary | ICD-10-CM | POA: Diagnosis not present

## 2018-05-26 DIAGNOSIS — Z1283 Encounter for screening for malignant neoplasm of skin: Secondary | ICD-10-CM | POA: Diagnosis not present

## 2018-05-26 DIAGNOSIS — C44629 Squamous cell carcinoma of skin of left upper limb, including shoulder: Secondary | ICD-10-CM | POA: Diagnosis not present

## 2018-06-11 DIAGNOSIS — K219 Gastro-esophageal reflux disease without esophagitis: Secondary | ICD-10-CM | POA: Diagnosis not present

## 2018-06-11 DIAGNOSIS — R7301 Impaired fasting glucose: Secondary | ICD-10-CM | POA: Diagnosis not present

## 2018-06-11 DIAGNOSIS — J479 Bronchiectasis, uncomplicated: Secondary | ICD-10-CM | POA: Diagnosis not present

## 2018-06-11 DIAGNOSIS — I10 Essential (primary) hypertension: Secondary | ICD-10-CM | POA: Diagnosis not present

## 2018-06-11 DIAGNOSIS — E782 Mixed hyperlipidemia: Secondary | ICD-10-CM | POA: Diagnosis not present

## 2018-06-11 DIAGNOSIS — E039 Hypothyroidism, unspecified: Secondary | ICD-10-CM | POA: Diagnosis not present

## 2018-06-11 DIAGNOSIS — E875 Hyperkalemia: Secondary | ICD-10-CM | POA: Diagnosis not present

## 2018-06-12 ENCOUNTER — Telehealth: Payer: Self-pay | Admitting: Physician Assistant

## 2018-06-12 NOTE — Telephone Encounter (Signed)
Patient had wanted to continue the medication at the last conversation. She has a follow up appointment with Dr Carlean Purl.  Advised Amy at Dr Juel Burrow office that she should continue it. Calcium is okay. Keep follow up appointment.

## 2018-06-18 DIAGNOSIS — H401131 Primary open-angle glaucoma, bilateral, mild stage: Secondary | ICD-10-CM | POA: Diagnosis not present

## 2018-06-20 ENCOUNTER — Encounter: Payer: Self-pay | Admitting: Internal Medicine

## 2018-06-20 ENCOUNTER — Encounter (INDEPENDENT_AMBULATORY_CARE_PROVIDER_SITE_OTHER): Payer: Self-pay

## 2018-06-20 ENCOUNTER — Ambulatory Visit: Payer: PPO | Admitting: Internal Medicine

## 2018-06-20 VITALS — Ht 68.0 in | Wt 155.2 lb

## 2018-06-20 DIAGNOSIS — K219 Gastro-esophageal reflux disease without esophagitis: Secondary | ICD-10-CM | POA: Diagnosis not present

## 2018-06-20 DIAGNOSIS — K580 Irritable bowel syndrome with diarrhea: Secondary | ICD-10-CM

## 2018-06-20 DIAGNOSIS — R933 Abnormal findings on diagnostic imaging of other parts of digestive tract: Secondary | ICD-10-CM

## 2018-06-20 MED ORDER — PANTOPRAZOLE SODIUM 40 MG PO TBEC: 40.0000 mg | DELAYED_RELEASE_TABLET | Freq: Every day | ORAL | 3 refills | Status: DC

## 2018-06-20 NOTE — Patient Instructions (Signed)
Decrease your Pantoprazole to once daily dosing.  Please call us back to schedule your endoscopy once you have figured out the dates that your daughter will be available to bring you.  Please purchase the following medications over the counter and take as directed: Imodium-Take 1-2 tablets by mouth once to twice daily for diarrhea  If you get worsening watery diarrhea, let us know so that we can order stool testing.  If you are age 81 or older, your body mass index should be between 23-30. Your Body mass index is 23.6 kg/m. If this is out of the aforementioned range listed, please consider follow up with your Primary Care Provider.  If you are age 44 or younger, your body mass index should be between 19-25. Your Body mass index is 23.6 kg/m. If this is out of the aformentioned range listed, please consider follow up with your Primary Care Provider.   Thank you for choosing me and [facility]!

## 2018-06-20 NOTE — Progress Notes (Signed)
Christy Richardson 83 y.o. 11-02-1935 [id number]  Assessment & Plan:   Encounter Diagnoses  Name Primary?  . Abnormal CT scan, stomach Yes  . Irritable bowel syndrome with diarrhea   . Gastroesophageal reflux disease, esophagitis presence not specified     Decrease PPI to qd ? Change ? Diarrhea related to this somewhat i.e. pantoprazole.  Consider changing to another PPI depending upon cost issues. Loperamide 1-2 qd-bid EGD should be done to evaluate this pylorus abnormality Exclude malignancy in her age.  Probably not cancer but should be done. She will call back to set that up as she relies on her daughter who is a Occupational psychologist at [employer], to transport her and be with her and she needs to know her work schedule.  I will try to schedule her with me but she may need to be evaluated by 1 of my partners that has availability and she is accepting of that.  I appreciate the opportunity to care for this patient. CC: Celene Squibb, MD     Subjective:   Chief Complaint:  HPI The patient is here for follow-up, she had seen Amy Esterwood in April and May of this year.  A CT scan demonstrated abnormality of the pylorus.  The patient elected treatment with PPI and wanted to hold off on any endoscopic evaluation.  PPI was changed from 40 mg daily to twice daily.  Epigastric pain improved but she still has some reflux and choking at times.  She also has a lot of diarrhea she has known long-standing irritable bowel syndrome she cannot tell me if it is worse or not for sure though does think when she was on some clindamycin for a dental infection lately it was worse and she just came off of that medication.  A few watery stools throughout the day but not all watery.  No fevers.  Now she has started doxycycline because she thinks her bronchiectasis is flaring and she has a self treatment protocol through Dr. Lake Bells. She is frustrated with fecal incontinence, she wears a pad.  I know from prior rectal  exam she has a weak anal sphincter.  We had talked about physical therapy in the past and she declined and she says she does not drive by herself much at all and coming from [place] to [facility] for pelvic floor physical therapy would be a problem.  She has taken Imodium in the past and she is used some hyoscyamine recently as prescribed by Nicoletta Ba.  It helps "some".  Allergies  Allergen Reactions  . Penicillins     REACTION: Rash  . Prednisone     REACTION: Increase eye pressure with gloucoma. Broke out in rash after injection per Dr. Percell Miller  . Sulfonamide Derivatives     REACTION: Rash - not sure   Current Meds  Medication Sig  . Alpha-D-Galactosidase (BEANO) TABS Take 1 tablet by mouth as needed (for gas).   . ALPRAZolam (XANAX) 1 MG tablet Take 1 tablet (1 mg total) by mouth at bedtime as needed.  . brimonidine (ALPHAGAN) 0.2 % ophthalmic solution Place 1 drop into both eyes 2 (two) times daily. Place 1 drop into both eyes 2 (two) times daily.  . cetirizine (ZYRTEC) 10 MG tablet Take 10 mg by mouth daily.   . cyclobenzaprine (FLEXERIL) 10 MG tablet Take 10 mg by mouth 3 (three) times daily as needed for muscle spasms.  . dorzolamide (TRUSOPT) 2 % ophthalmic solution Place 1 drop into both  eyes 2 (two) times daily. Place 1 drop into both eyes 2 (two) times daily.  Marland Kitchen doxycycline (ADOXA) 100 MG tablet Take 100 mg by mouth 2 (two) times daily.  . fluticasone-salmeterol (ADVAIR HFA) 115-21 MCG/ACT inhaler Inhale 2 puffs into the lungs 2 (two) times daily.  Marland Kitchen Hyoscyamine Sulfate SL (LEVSIN/SL) 0.125 MG SUBL Take 1 table by mouth twice daily as needed for IBS/diarrhea.  Marland Kitchen ipratropium (ATROVENT) 0.03 % nasal spray Place 2 sprays into both nostrils every 12 (twelve) hours.  Marland Kitchen levothyroxine (SYNTHROID, LEVOTHROID) 50 MCG tablet Take 50 mcg by mouth daily before breakfast.  . LORazepam (ATIVAN) 0.5 MG tablet Take 0.5 mg by mouth 2 (two) times daily as needed (tremors).  . losartan  (COZAAR) 100 MG tablet Take 1 tablet (100 mg total) by mouth daily.  . metoprolol succinate (TOPROL-XL) 50 MG 24 hr tablet Take 100 mg by mouth every evening. Take 1.5 tablets by mouth every evening. Take with or immediately following a meal.   . Multiple Vitamins-Minerals (MULTIVITAMIN WITH MINERALS) tablet Take 1 tablet by mouth daily.    . ondansetron (ZOFRAN) 4 MG tablet Take 1 tablet (4 mg total) by mouth every 8 (eight) hours as needed.  . pantoprazole (PROTONIX) 40 MG tablet Take 1 tablet (40 mg total) by mouth 2 (two) times daily.  . pravastatin (PRAVACHOL) 40 MG tablet Take 1 tablet (40 mg total) by mouth at bedtime.  Marland Kitchen Respiratory Therapy Supplies (FLUTTER) DEVI Use as directed.  . sertraline (ZOLOFT) 100 MG tablet Take 1 tablet (100 mg total) by mouth at bedtime. 2 at bedtime  . sodium chloride (OCEAN NASAL SPRAY) 0.65 % nasal spray Place 2 sprays into both nostrils at bedtime. Use as needed  . sucralfate (CARAFATE) 1 GM/10ML suspension Take 1 gram between meals and at bedtime.  . traMADol (ULTRAM) 50 MG tablet Take 50 mg by mouth 2 (two) times daily as needed (pain).   . traZODone (DESYREL) 50 MG tablet Take 50 mg by mouth at bedtime.    . triamcinolone (NASACORT ALLERGY 24HR) 55 MCG/ACT AERO nasal inhaler Place 1 spray into the nose daily.   Past Medical History:  Diagnosis Date  . Adenomatous polyp of colon 03/2007  . Allergic rhinitis   . Anxiety   . Blood transfusion   . Bronchiectasis ([place])   . Depression   . Diabetes mellitus without complication ([place])   . Diverticulosis of colon (without mention of hemorrhage)   . DOE (dyspnea on exertion)   . Fatigue   . Fecal incontinence   . GERD (gastroesophageal reflux disease)   . Glaucoma   . Goiter   . Heart murmur   . Hyperlipidemia   . Hypertension   . Hypothyroidism   . IBS (irritable bowel syndrome)   . Migraine   . Mitral valve prolapse   . Neck mass   . Nonspecific abnormal electrocardiogram (ECG) (EKG)   .  Palpitations   . Polyp, sigmoid colon   . Pulmonary nodules   . Recurrent UTI   . Tremor    Past Surgical History:  Procedure Laterality Date  . ABDOMINAL HYSTERECTOMY    . CARDIOVASCULAR STRESS TEST  09/19/2011   No scintigraphic evidence of inducible myocardial ischemia. Pharmacological stress test without chest pain or EKG changes for ischemia.  . COLONOSCOPY    . ESOPHAGOGASTRODUODENOSCOPY    . MASTOIDECTOMY     rt ear  . OOPHORECTOMY    . SKIN CANCER EXCISION Left    arm  .  TOTAL HIP ARTHROPLASTY  5/10   left  . TRANSTHORACIC ECHOCARDIOGRAM  09/19/2011   EF >72%, stage 1 diastolic dysfunction, mild tricuspid valve regurg   Social History   Social History Narrative   Widowed since 2002. Lives with her Daughter.   family history includes Asthma in her daughter; Colon cancer in her paternal grandfather; Coronary artery disease in her mother; Diabetes in her father; Prostate cancer in her brother; Scoliosis in her daughter; Throat cancer in her mother.   Review of Systems As per HPI  Objective:   Physical Exam @Ht  5\' 8"  (1.727 m)   Wt 155 lb 3.2 oz (70.4 kg)   BMI 23.60 kg/m @  General:  NAD Eyes:   anicteric Lungs:  Clear but decBS Heart::  S1S2 no rubs, murmurs or gallops - distant Abdomen:  soft and nontender, BS+ Ext:   no edema, cyanosis or clubbing    Data Reviewed:  See HPI.  I reviewed CT scan labs in the computer by her GI notes. CBC lipase CMET urinalysis and culture all -April 18 of this year.

## 2018-06-24 ENCOUNTER — Encounter: Payer: Self-pay | Admitting: Internal Medicine

## 2018-07-01 DIAGNOSIS — Z681 Body mass index (BMI) 19 or less, adult: Secondary | ICD-10-CM | POA: Diagnosis not present

## 2018-07-01 DIAGNOSIS — R05 Cough: Secondary | ICD-10-CM | POA: Diagnosis not present

## 2018-07-01 DIAGNOSIS — J479 Bronchiectasis, uncomplicated: Secondary | ICD-10-CM | POA: Diagnosis not present

## 2018-07-07 DIAGNOSIS — Z08 Encounter for follow-up examination after completed treatment for malignant neoplasm: Secondary | ICD-10-CM | POA: Diagnosis not present

## 2018-07-07 DIAGNOSIS — D171 Benign lipomatous neoplasm of skin and subcutaneous tissue of trunk: Secondary | ICD-10-CM | POA: Diagnosis not present

## 2018-07-07 DIAGNOSIS — Z85828 Personal history of other malignant neoplasm of skin: Secondary | ICD-10-CM | POA: Diagnosis not present

## 2018-07-15 ENCOUNTER — Ambulatory Visit (AMBULATORY_SURGERY_CENTER): Payer: Self-pay

## 2018-07-15 ENCOUNTER — Encounter: Payer: Self-pay | Admitting: Internal Medicine

## 2018-07-15 VITALS — Ht 68.0 in | Wt 153.0 lb

## 2018-07-15 DIAGNOSIS — R933 Abnormal findings on diagnostic imaging of other parts of digestive tract: Secondary | ICD-10-CM

## 2018-07-15 DIAGNOSIS — K219 Gastro-esophageal reflux disease without esophagitis: Secondary | ICD-10-CM

## 2018-07-15 NOTE — Progress Notes (Signed)
Per pt, no allergies to soy or egg products.Pt not taking any weight loss meds or using  O2 at home.  Pt refused emmi video.  During the PV, the pt states her blood pressure goes up and down and is very erratic. She does check her B/P several times a day and does adjust her  2 medications.  The pt has discussed with Dr Rockey Situ her changes in her B/P. The pt will check her B/P at home and let the nurse know if she has held her B/P medication that morning. Gwyndolyn Saxon

## 2018-07-29 ENCOUNTER — Encounter: Payer: Self-pay | Admitting: Internal Medicine

## 2018-07-29 ENCOUNTER — Ambulatory Visit (AMBULATORY_SURGERY_CENTER): Payer: PPO | Admitting: Internal Medicine

## 2018-07-29 VITALS — BP 139/75 | HR 70 | Temp 98.6°F | Resp 11 | Ht 68.0 in | Wt 153.0 lb

## 2018-07-29 DIAGNOSIS — I10 Essential (primary) hypertension: Secondary | ICD-10-CM | POA: Diagnosis not present

## 2018-07-29 DIAGNOSIS — R933 Abnormal findings on diagnostic imaging of other parts of digestive tract: Secondary | ICD-10-CM

## 2018-07-29 DIAGNOSIS — K219 Gastro-esophageal reflux disease without esophagitis: Secondary | ICD-10-CM

## 2018-07-29 MED ORDER — SODIUM CHLORIDE 0.9 % IV SOLN: 500.0000 mL | Freq: Once | INTRAVENOUS | Status: DC

## 2018-07-29 NOTE — Progress Notes (Signed)
Pt's states no medical or surgical changes since previsit or office visit. 

## 2018-07-29 NOTE — Progress Notes (Signed)
Called to room to assist during endoscopic procedure.  Patient ID and intended procedure confirmed with present staff. Received instructions for my participation in the procedure from the performing physician.  

## 2018-07-29 NOTE — Patient Instructions (Addendum)
I think things are ok - it was mildly inflamed in the area of stomach that looked thickened on the CT scan so I took some biopsies to make sure its ok - which is what I think.  I appreciate the opportunity to care for you. Gatha Mayer, MD, FACG   YOU HAD AN ENDOSCOPIC PROCEDURE TODAY AT [facility]:   Refer to the procedure report that was given to you for any specific questions about what was found during the examination.  If the procedure report does not answer your questions, please call your gastroenterologist to clarify.  If you requested that your care partner not be given the details of your procedure findings, then the procedure report has been included in a sealed envelope for you to review at your convenience later.  YOU SHOULD EXPECT: Some feelings of bloating in the abdomen. Passage of more gas than usual.  Walking can help get rid of the air that was put into your GI tract during the procedure and reduce the bloating. If you had a lower endoscopy (such as a colonoscopy or flexible sigmoidoscopy) you may notice spotting of blood in your stool or on the toilet paper. If you underwent a bowel prep for your procedure, you may not have a normal bowel movement for a few days.  Please Note:  You might notice some irritation and congestion in your nose or some drainage.  This is from the oxygen used during your procedure.  There is no need for concern and it should clear up in a day or so.  SYMPTOMS TO REPORT IMMEDIATELY:   Following lower endoscopy (colonoscopy or flexible sigmoidoscopy):  Excessive amounts of blood in the stool  Significant tenderness or worsening of abdominal pains  Swelling of the abdomen that is new, acute  Fever of 100F or higher   Following upper endoscopy (EGD)  Vomiting of blood or coffee ground material  New chest pain or pain under the shoulder blades  Painful or persistently difficult swallowing  New shortness of  breath  Fever of 100F or higher  Black, tarry-looking stools  For urgent or emergent issues, a gastroenterologist can be reached at any hour by calling [phone].   DIET:  We do recommend a small meal at first, but then you may proceed to your regular diet.  Drink plenty of fluids but you should avoid alcoholic beverages for 24 hours.  ACTIVITY:  You should plan to take it easy for the rest of today and you should NOT DRIVE or use heavy machinery until tomorrow (because of the sedation medicines used during the test).    FOLLOW UP: Our staff will call the number listed on your records the next business day following your procedure to check on you and address any questions or concerns that you may have regarding the information given to you following your procedure. If we do not reach you, we will leave a message.  However, if you are feeling well and you are not experiencing any problems, there is no need to return our call.  We will assume that you have returned to your regular daily activities without incident.  If any biopsies were taken you will be contacted by phone or by letter within the next 1-3 weeks.  Please call us at [phone] if you have not heard about the biopsies in 3 weeks.    SIGNATURES/CONFIDENTIALITY: You and/or your care partner have signed paperwork which will be entered into  your electronic medical record.  These signatures attest to the fact that that the information above on your After Visit Summary has been reviewed and is understood.  Full responsibility of the confidentiality of this discharge information lies with you and/or your care-partner.

## 2018-07-29 NOTE — Op Note (Signed)
[facility] Patient Name: Christy Richardson Procedure Date: 07/29/2018 3:41 PM MRN: [id number] Endoscopist: Gatha Mayer , MD Age: 83 Referring MD:  Date of Birth: 12-Jul-1935 Gender: Female Account #: [id number] Procedure:                Upper GI endoscopy Indications:              Abnormal CT of the GI tract Medicines:                Propofol per Anesthesia, Monitored Anesthesia Care Procedure:                Pre-Anesthesia Assessment:                           - Prior to the procedure, a History and Physical                            was performed, and patient medications and                            allergies were reviewed. The patient's tolerance of                            previous anesthesia was also reviewed. The risks                            and benefits of the procedure and the sedation                            options and risks were discussed with the patient.                            All questions were answered, and informed consent                            was obtained. Prior Anticoagulants: The patient has                            taken no previous anticoagulant or antiplatelet                            agents. ASA Grade Assessment: II - A patient with                            mild systemic disease. After reviewing the risks                            and benefits, the patient was deemed in                            satisfactory condition to undergo the procedure.                           After obtaining informed consent, the endoscope was  passed under direct vision. Throughout the                            procedure, the patient's blood pressure, pulse, and                            oxygen saturations were monitored continuously. The                            Endoscope was introduced through the mouth, and                            advanced to the second part of duodenum. The upper                            GI  endoscopy was accomplished without difficulty.                            The patient tolerated the procedure well. Scope In: Scope Out: Findings:                 Diffuse mildly erythematous/edematous mucosa                            without bleeding was found in the prepyloric region                            of the stomach. Biopsies were taken with a cold                            forceps for histology. Verification of patient                            identification for the specimen was done. Estimated                            blood loss was minimal.                           The exam was otherwise without abnormality.                           The cardia and gastric fundus were normal on                            retroflexion. Complications:            No immediate complications. Estimated Blood Loss:     Estimated blood loss was minimal. Impression:               - Erythematous/edematous mucosa in the prepyloric                            region of the stomach. Biopsied since this was area  that was abnormal on CT. probably gastritis but                            will r/o neoplasia                           - The examination was otherwise normal. Recommendation:           - Patient has a contact number available for                            emergencies. The signs and symptoms of potential                            delayed complications were discussed with the                            patient. Return to normal activities tomorrow.                            Written discharge instructions were provided to the                            patient.                           - Resume previous diet.                           - Continue present medications.                           - Await pathology results. Gatha Mayer, MD 07/29/2018 4:09:48 PM This report has been signed electronically.

## 2018-07-29 NOTE — Progress Notes (Signed)
Report to PACU, RN, vss, BBS= Clear.  

## 2018-07-30 ENCOUNTER — Telehealth: Payer: Self-pay

## 2018-07-30 NOTE — Telephone Encounter (Signed)
  Follow up Call-  Call back number 07/29/2018  Post procedure Call Back phone  # [phone] hm  Permission to leave phone message Yes  Some recent data might be hidden     Patient questions:  Do you have a fever, pain , or abdominal swelling? No. Pain Score  0 *  Have you tolerated food without any problems? Yes.    Have you been able to return to your normal activities? Yes.    Do you have any questions about your discharge instructions: Diet   No. Medications  No. Follow up visit  No.  Do you have questions or concerns about your Care? No.  Actions: * If pain score is 4 or above: No action needed, pain <4.

## 2018-08-01 DIAGNOSIS — J479 Bronchiectasis, uncomplicated: Secondary | ICD-10-CM | POA: Diagnosis not present

## 2018-08-01 DIAGNOSIS — I10 Essential (primary) hypertension: Secondary | ICD-10-CM | POA: Diagnosis not present

## 2018-08-01 DIAGNOSIS — R2240 Localized swelling, mass and lump, unspecified lower limb: Secondary | ICD-10-CM | POA: Diagnosis not present

## 2018-08-01 DIAGNOSIS — J060 Acute laryngopharyngitis: Secondary | ICD-10-CM | POA: Diagnosis not present

## 2018-08-01 DIAGNOSIS — R109 Unspecified abdominal pain: Secondary | ICD-10-CM | POA: Diagnosis not present

## 2018-08-01 DIAGNOSIS — E039 Hypothyroidism, unspecified: Secondary | ICD-10-CM | POA: Diagnosis not present

## 2018-08-01 DIAGNOSIS — H4010X0 Unspecified open-angle glaucoma, stage unspecified: Secondary | ICD-10-CM | POA: Diagnosis not present

## 2018-08-01 DIAGNOSIS — R7301 Impaired fasting glucose: Secondary | ICD-10-CM | POA: Diagnosis not present

## 2018-08-01 DIAGNOSIS — M542 Cervicalgia: Secondary | ICD-10-CM | POA: Diagnosis not present

## 2018-08-01 DIAGNOSIS — E875 Hyperkalemia: Secondary | ICD-10-CM | POA: Diagnosis not present

## 2018-08-01 DIAGNOSIS — K219 Gastro-esophageal reflux disease without esophagitis: Secondary | ICD-10-CM | POA: Diagnosis not present

## 2018-08-01 DIAGNOSIS — E782 Mixed hyperlipidemia: Secondary | ICD-10-CM | POA: Diagnosis not present

## 2018-08-05 NOTE — Progress Notes (Signed)
Please let patient know biopsies are ok no cancer or any problem.  Please ask if she is ok re: GI sxs and if not let me know  [facility] no letter or recall

## 2018-08-25 DIAGNOSIS — R7301 Impaired fasting glucose: Secondary | ICD-10-CM | POA: Diagnosis not present

## 2018-08-25 DIAGNOSIS — E782 Mixed hyperlipidemia: Secondary | ICD-10-CM | POA: Diagnosis not present

## 2018-08-25 DIAGNOSIS — E039 Hypothyroidism, unspecified: Secondary | ICD-10-CM | POA: Diagnosis not present

## 2018-08-25 DIAGNOSIS — I10 Essential (primary) hypertension: Secondary | ICD-10-CM | POA: Diagnosis not present

## 2018-08-27 DIAGNOSIS — R000 Tachycardia, unspecified: Secondary | ICD-10-CM | POA: Diagnosis not present

## 2018-08-27 DIAGNOSIS — C4490 Unspecified malignant neoplasm of skin, unspecified: Secondary | ICD-10-CM | POA: Diagnosis not present

## 2018-08-27 DIAGNOSIS — R7301 Impaired fasting glucose: Secondary | ICD-10-CM | POA: Diagnosis not present

## 2018-08-27 DIAGNOSIS — J479 Bronchiectasis, uncomplicated: Secondary | ICD-10-CM | POA: Diagnosis not present

## 2018-08-27 DIAGNOSIS — E782 Mixed hyperlipidemia: Secondary | ICD-10-CM | POA: Diagnosis not present

## 2018-08-27 DIAGNOSIS — K219 Gastro-esophageal reflux disease without esophagitis: Secondary | ICD-10-CM | POA: Diagnosis not present

## 2018-08-27 DIAGNOSIS — E039 Hypothyroidism, unspecified: Secondary | ICD-10-CM | POA: Diagnosis not present

## 2018-08-27 DIAGNOSIS — M542 Cervicalgia: Secondary | ICD-10-CM | POA: Diagnosis not present

## 2018-08-27 DIAGNOSIS — H4010X0 Unspecified open-angle glaucoma, stage unspecified: Secondary | ICD-10-CM | POA: Diagnosis not present

## 2018-08-27 DIAGNOSIS — R490 Dysphonia: Secondary | ICD-10-CM | POA: Diagnosis not present

## 2018-08-27 DIAGNOSIS — E875 Hyperkalemia: Secondary | ICD-10-CM | POA: Diagnosis not present

## 2018-08-27 DIAGNOSIS — I10 Essential (primary) hypertension: Secondary | ICD-10-CM | POA: Diagnosis not present

## 2018-08-29 DIAGNOSIS — R7301 Impaired fasting glucose: Secondary | ICD-10-CM | POA: Diagnosis not present

## 2018-08-29 DIAGNOSIS — E782 Mixed hyperlipidemia: Secondary | ICD-10-CM | POA: Diagnosis not present

## 2018-08-29 DIAGNOSIS — K219 Gastro-esophageal reflux disease without esophagitis: Secondary | ICD-10-CM | POA: Diagnosis not present

## 2018-08-29 DIAGNOSIS — I10 Essential (primary) hypertension: Secondary | ICD-10-CM | POA: Diagnosis not present

## 2018-08-29 DIAGNOSIS — E039 Hypothyroidism, unspecified: Secondary | ICD-10-CM | POA: Diagnosis not present

## 2018-09-02 DIAGNOSIS — I10 Essential (primary) hypertension: Secondary | ICD-10-CM | POA: Diagnosis not present

## 2018-09-02 DIAGNOSIS — E782 Mixed hyperlipidemia: Secondary | ICD-10-CM | POA: Diagnosis not present

## 2018-09-02 DIAGNOSIS — Z0000 Encounter for general adult medical examination without abnormal findings: Secondary | ICD-10-CM | POA: Diagnosis not present

## 2018-09-02 DIAGNOSIS — E039 Hypothyroidism, unspecified: Secondary | ICD-10-CM | POA: Diagnosis not present

## 2018-09-02 DIAGNOSIS — H4010X0 Unspecified open-angle glaucoma, stage unspecified: Secondary | ICD-10-CM | POA: Diagnosis not present

## 2018-09-02 DIAGNOSIS — Z6823 Body mass index (BMI) 23.0-23.9, adult: Secondary | ICD-10-CM | POA: Diagnosis not present

## 2018-09-11 ENCOUNTER — Other Ambulatory Visit (HOSPITAL_COMMUNITY): Payer: Self-pay | Admitting: Internal Medicine

## 2018-09-11 DIAGNOSIS — Z1231 Encounter for screening mammogram for malignant neoplasm of breast: Secondary | ICD-10-CM

## 2018-09-17 DIAGNOSIS — H903 Sensorineural hearing loss, bilateral: Secondary | ICD-10-CM | POA: Diagnosis not present

## 2018-09-17 DIAGNOSIS — H7011 Chronic mastoiditis, right ear: Secondary | ICD-10-CM | POA: Diagnosis not present

## 2018-09-18 ENCOUNTER — Encounter (HOSPITAL_COMMUNITY): Payer: Self-pay

## 2018-09-18 ENCOUNTER — Ambulatory Visit (HOSPITAL_COMMUNITY)
Admission: RE | Admit: 2018-09-18 | Discharge: 2018-09-18 | Disposition: A | Discharge status: Home / Self Care | Payer: PPO | Source: Ambulatory Visit | Attending: Internal Medicine | Admitting: Internal Medicine

## 2018-09-18 DIAGNOSIS — Z1231 Encounter for screening mammogram for malignant neoplasm of breast: Secondary | ICD-10-CM

## 2018-09-22 ENCOUNTER — Other Ambulatory Visit (HOSPITAL_COMMUNITY): Payer: Self-pay | Admitting: Internal Medicine

## 2018-09-22 DIAGNOSIS — N644 Mastodynia: Secondary | ICD-10-CM | POA: Diagnosis not present

## 2018-09-22 DIAGNOSIS — N6453 Retraction of nipple: Secondary | ICD-10-CM

## 2018-09-22 DIAGNOSIS — Z6823 Body mass index (BMI) 23.0-23.9, adult: Secondary | ICD-10-CM | POA: Diagnosis not present

## 2018-10-07 ENCOUNTER — Ambulatory Visit (HOSPITAL_COMMUNITY)
Admission: RE | Admit: 2018-10-07 | Discharge: 2018-10-07 | Disposition: A | Discharge status: Home / Self Care | Payer: PPO | Source: Ambulatory Visit | Attending: Internal Medicine | Admitting: Internal Medicine

## 2018-10-07 ENCOUNTER — Ambulatory Visit (HOSPITAL_COMMUNITY): Payer: PPO

## 2018-10-07 DIAGNOSIS — N6453 Retraction of nipple: Secondary | ICD-10-CM | POA: Diagnosis not present

## 2018-10-07 DIAGNOSIS — N644 Mastodynia: Secondary | ICD-10-CM | POA: Diagnosis not present

## 2018-10-07 DIAGNOSIS — R922 Inconclusive mammogram: Secondary | ICD-10-CM | POA: Diagnosis not present

## 2018-10-07 DIAGNOSIS — N6489 Other specified disorders of breast: Secondary | ICD-10-CM | POA: Diagnosis not present

## 2018-10-23 DIAGNOSIS — E039 Hypothyroidism, unspecified: Secondary | ICD-10-CM | POA: Diagnosis not present

## 2018-10-23 DIAGNOSIS — R7301 Impaired fasting glucose: Secondary | ICD-10-CM | POA: Diagnosis not present

## 2018-10-23 DIAGNOSIS — R000 Tachycardia, unspecified: Secondary | ICD-10-CM | POA: Diagnosis not present

## 2018-10-23 DIAGNOSIS — K219 Gastro-esophageal reflux disease without esophagitis: Secondary | ICD-10-CM | POA: Diagnosis not present

## 2018-10-23 DIAGNOSIS — E782 Mixed hyperlipidemia: Secondary | ICD-10-CM | POA: Diagnosis not present

## 2018-10-23 DIAGNOSIS — I10 Essential (primary) hypertension: Secondary | ICD-10-CM | POA: Diagnosis not present

## 2018-11-03 ENCOUNTER — Telehealth: Payer: Self-pay | Admitting: Cardiovascular Disease

## 2018-11-03 NOTE — Telephone Encounter (Signed)
Pt c/o BP issue: STAT if pt c/o blurred vision, one-sided weakness or slurred speech  1. What are your last 5 BP readings?  Saturday  8:00a 220/131 pulse 88 12:00p 169/103 pulse 83 3:30p 144/82 pulse 100 6:00p 174/91 pulse 95 Sunday 8:30a 199/108 pulse 75 9:40a 165/87 pulse 79 2:30p 144/79 pulse 80 This morning   8:00a 182/101 pulse 75  2. Are you having any other symptoms (ex. Dizziness, headache, blurred vision, passed out)? Headache pounding   3. What is your BP issue? Patient has been having issues with high blood pressure, unable to get it to go down, has taken losartan.  Patient is scheduled to see Angelica Ran on 12/4 at 3:00p

## 2018-11-03 NOTE — Telephone Encounter (Signed)
Spoke with patient.  States she's noticed her BP getting higher over the past few days. Takes Losartan 100 mg in the morning and metoprolol 100 mg in the evening. When she woke up this morning her BP was high and headache was pounding. Headache has gotten better. Today, BP 139/80, HR 80 around 1 pm.  Shortness of breath is at her normal (baseline).  Denies chest pain, blurred vision or dizziness.  She is scheduled to see Ignacia Bayley, NP tomorrow at 11:30 am. Advised her to keep this appointment at this time. Pt verbalized understanding to call 911 or go to the emergency room, if he develops any new or worsening symptoms.

## 2018-11-04 ENCOUNTER — Ambulatory Visit (INDEPENDENT_AMBULATORY_CARE_PROVIDER_SITE_OTHER): Payer: PPO | Admitting: Nurse Practitioner

## 2018-11-04 ENCOUNTER — Encounter: Payer: Self-pay | Admitting: Nurse Practitioner

## 2018-11-04 ENCOUNTER — Ambulatory Visit: Payer: Self-pay | Admitting: Nurse Practitioner

## 2018-11-04 VITALS — BP 130/70 | HR 70 | Ht 68.0 in | Wt 155.0 lb

## 2018-11-04 DIAGNOSIS — I10 Essential (primary) hypertension: Secondary | ICD-10-CM

## 2018-11-04 DIAGNOSIS — R0989 Other specified symptoms and signs involving the circulatory and respiratory systems: Secondary | ICD-10-CM | POA: Diagnosis not present

## 2018-11-04 DIAGNOSIS — R072 Precordial pain: Secondary | ICD-10-CM

## 2018-11-04 DIAGNOSIS — R079 Chest pain, unspecified: Secondary | ICD-10-CM | POA: Diagnosis not present

## 2018-11-04 MED ORDER — CARVEDILOL 12.5 MG PO TABS: 12.5000 mg | ORAL_TABLET | Freq: Two times a day (BID) | ORAL | 3 refills | Status: DC

## 2018-11-04 MED ORDER — LOSARTAN POTASSIUM 100 MG PO TABS: 100.0000 mg | ORAL_TABLET | Freq: Every day | ORAL | 3 refills | Status: DC

## 2018-11-04 NOTE — Telephone Encounter (Signed)
Patient declined to go to the ED wants to be seen for ov.  Rescheduled appt per patient request

## 2018-11-04 NOTE — Telephone Encounter (Signed)
Patient's daughter calling back this morning.  Patient is having elevated BP this morning as noted in previous note. Patient expressed she has bad headache and pressure in her ears now as well. Discussed with Ignacia Bayley, NP who advised patient should go to the ER. Daughter verbalized understanding to go on to the ER.  Appt cancelled.

## 2018-11-04 NOTE — Telephone Encounter (Signed)
Pt c/o BP issue: STAT if pt c/o blurred vision, one-sided weakness or slurred speech  1. What are your last 5 BP readings? This morning  220/107 after meds 207/114  2. Are you having any other symptoms (ex. Dizziness, headache, blurred vision, passed out)?  Dizziness headache   3. What is your BP issue?  Elevated advise to wait for ov  Or go now to ER

## 2018-11-04 NOTE — Patient Instructions (Signed)
Medication Instructions:  Your physician has recommended you make the following change in your medication:  1- STOP Metoprolol. 2- START Carvedilol 12.5 mg(1 tablet) by mouth two times a day. 3- TAKE Losartan 100 mg by mouth once a day.  If you need a refill on your cardiac medications before your next appointment, please call your pharmacy.   Lab work: Your physician recommends that you return for lab work in: TODAY - [place], BMET.  If you have labs (blood work) drawn today and your tests are completely normal, you will receive your results only by: Marland Kitchen MyChart Message (if you have MyChart) OR . A paper copy in the mail If you have any lab test that is abnormal or we need to change your treatment, we will call you to review the results.  Testing/Procedures: Your physician has requested that you have a renal artery duplex. During this test, an ultrasound is used to evaluate blood flow to the kidneys. Allow one hour for this exam. Do not eat after midnight the day before and avoid carbonated beverages. Take your medications as you usually do.   Your physician has requested that you have a lexiscan myoview. For further information please visit [email or web address]. Please follow instruction sheet, as given.  [place]  Your caregiver has ordered a Stress Test with nuclear imaging. The purpose of this test is to evaluate the blood supply to your heart muscle. This procedure is referred to as a "Non-Invasive Stress Test." This is because other than having an IV started in your vein, nothing is inserted or "invades" your body. Cardiac stress tests are done to find areas of poor blood flow to the heart by determining the extent of coronary artery disease (CAD). Some patients exercise on a treadmill, which naturally increases the blood flow to your heart, while others who are  unable to walk on a treadmill due to physical limitations have a pharmacologic/chemical stress agent called Lexiscan . This  medicine will mimic walking on a treadmill by temporarily increasing your coronary blood flow.   Please note: these test may take anywhere between 2-4 hours to complete  PLEASE REPORT TO [facility] AT THE FIRST DESK WILL DIRECT YOU WHERE TO GO  Date of Procedure:_____________________________________  Arrival Time for Procedure:______________________________   PLEASE NOTIFY THE OFFICE AT LEAST 24 HOURS IN ADVANCE IF YOU ARE UNABLE TO KEEP YOUR APPOINTMENT.  [phone] AND  PLEASE NOTIFY NUCLEAR MEDICINE AT [facility] AT LEAST 24 HOURS IN ADVANCE IF YOU ARE UNABLE TO KEEP YOUR APPOINTMENT. [phone]  How to prepare for your Myoview test:  1. Do not eat or drink after midnight 2. No caffeine for 24 hours prior to test 3. No smoking 24 hours prior to test. 4. Your medication may be taken with water.  If your doctor stopped a medication because of this test, do not take that medication. 5. Ladies, please do not wear dresses.  Skirts or pants are appropriate. Please wear a short sleeve shirt. 6. No perfume, cologne or lotion. 7. Wear comfortable walking shoes. No heels!   Follow-Up: At [facility], you and your health needs are our priority.  As part of our continuing mission to provide you with exceptional heart care, we have created designated Provider Care Teams.  These Care Teams include your primary Cardiologist (physician) and Advanced Practice Providers (APPs -  Physician Assistants and Nurse Practitioners) who all work together to provide you with the care you need, when you need  it. You will need a follow up appointment in 2 weeks.  Please call our office 2 months in advance to schedule this appointment.  You may see DR Ida Rogue or Murray Hodgkins, NP.     Cardiac Nuclear Scan A cardiac nuclear scan is a test that measures blood flow to the heart when a person is resting and when he or she is exercising. The test looks for problems such  as:  Not enough blood reaching a portion of the heart.  The heart muscle not working normally.  You may need this test if:  You have heart disease.  You have had abnormal lab results.  You have had heart surgery or angioplasty.  You have chest pain.  You have shortness of breath.  In this test, a radioactive dye (tracer) is injected into your bloodstream. After the tracer has traveled to your heart, an imaging device is used to measure how much of the tracer is absorbed by or distributed to various areas of your heart. This procedure is usually done at a hospital and takes 2-4 hours. Tell a health care provider about:  Any allergies you have.  All medicines you are taking, including vitamins, herbs, eye drops, creams, and over-the-counter medicines.  Any problems you or family members have had with the use of anesthetic medicines.  Any blood disorders you have.  Any surgeries you have had.  Any medical conditions you have.  Whether you are pregnant or may be pregnant. What are the risks? Generally, this is a safe procedure. However, problems may occur, including:  Serious chest pain and heart attack. This is only a risk if the stress portion of the test is done.  Rapid heartbeat.  Sensation of warmth in your chest. This usually passes quickly.  What happens before the procedure?  Ask your health care provider about changing or stopping your regular medicines. This is especially important if you are taking diabetes medicines or blood thinners.  Remove your jewelry on the day of the procedure. What happens during the procedure?  An IV tube will be inserted into one of your veins.  Your health care provider will inject a small amount of radioactive tracer through the tube.  You will wait for 20-40 minutes while the tracer travels through your bloodstream.  Your heart activity will be monitored with an electrocardiogram (ECG).  You will lie down on an exam  table.  Images of your heart will be taken for about 15-20 minutes.  You may be asked to exercise on a treadmill or stationary bike. While you exercise, your heart's activity will be monitored with an ECG, and your blood pressure will be checked. If you are unable to exercise, you may be given a medicine to increase blood flow to parts of your heart.  When blood flow to your heart has peaked, a tracer will again be injected through the IV tube.  After 20-40 minutes, you will get back on the exam table and have more images taken of your heart.  When the procedure is over, your IV tube will be removed. The procedure may vary among health care providers and hospitals. Depending on the type of tracer used, scans may need to be repeated 3-4 hours later. What happens after the procedure?  Unless your health care provider tells you otherwise, you may return to your normal schedule, including diet, activities, and medicines.  Unless your health care provider tells you otherwise, you may increase your fluid intake. This will  help flush the contrast dye from your body. Drink enough fluid to keep your urine clear or pale yellow.  It is up to you to get your test results. Ask your health care provider, or the department that is doing the test, when your results will be ready. Summary  A cardiac nuclear scan measures the blood flow to the heart when a person is resting and when he or she is exercising.  You may need this test if you are at risk for heart disease.  Tell your health care provider if you are pregnant.  Unless your health care provider tells you otherwise, increase your fluid intake. This will help flush the contrast dye from your body. Drink enough fluid to keep your urine clear or pale yellow. This information is not intended to replace advice given to you by your health care provider. Make sure you discuss any questions you have with your health care provider. Document Released:  12/14/2004 Document Revised: 11/21/2016 Document Reviewed: 10/28/2013 Elsevier Interactive Patient Education  2017 [employer].

## 2018-11-04 NOTE — Progress Notes (Signed)
Office Visit    Patient Name: Christy Richardson Date of Encounter: 11/04/2018  Primary Care Provider:  Celene Squibb, MD Primary Cardiologist:  Ida Rogue, MD  Chief Complaint    83 year old female with a history of labile hypertension, anxiety, bronchiectasis, chronic dyspnea, GERD, goiter, hyperlipidemia, and diastolic dysfunction, who presents for follow-up related to dyspnea on exertion and elevated blood pressures.  Past Medical History    Past Medical History:  Diagnosis Date  . Adenomatous polyp of colon 03/2007  . Allergic rhinitis   . Allergy    has a lot congestion  . Anxiety   . Blood transfusion 1974  . Bronchiectasis ([place])   . Cancer ([place])    left arm  . Depression   . Diabetes mellitus without complication (HCC)    no meds  . Diastolic dysfunction    a. 01/2014 Echo: EF 60-65%, no rwma, Gr1 DD, Ao sclerosis w/o stenosis. Mild TR.  . Diverticulosis of colon (without mention of hemorrhage)   . DOE (dyspnea on exertion)    per pt SOB for years  . Fatigue   . Fecal incontinence   . GERD (gastroesophageal reflux disease)   . Glaucoma   . Goiter    right side  . Heart murmur   . History of stress test    a. 01/2014 Lexi MV: no ischemia/infarct.  . Hyperlipidemia   . Hypertension   . Hypothyroidism   . IBS (irritable bowel syndrome)   . Migraine   . Mitral valve prolapse    a. 01/2014 Echo: no MR/MVP noted.  . Neck mass    right side/ no airway problems/ per pt has had since 2005  . Nonspecific abnormal electrocardiogram (ECG) (EKG)   . Palpitations   . Palpitations    a. 01/2014 CardioNet: wore for 9 days (rash). Sinus tach - 100's. No SVT/AF.  Marland Kitchen Polyp, sigmoid colon   . Post-operative nausea and vomiting   . Pulmonary nodules   . Recurrent UTI   . Tremor    Past Surgical History:  Procedure Laterality Date  . ABDOMINAL HYSTERECTOMY    . CARDIOVASCULAR STRESS TEST  09/19/2011   No scintigraphic evidence of inducible myocardial ischemia.  Pharmacological stress test without chest pain or EKG changes for ischemia.  . COLONOSCOPY    . ESOPHAGOGASTRODUODENOSCOPY    . MASTOIDECTOMY     rt ear/ as a teenager  . OOPHORECTOMY    . SKIN CANCER EXCISION Left    arm  . TOTAL HIP ARTHROPLASTY  5/10   left  . TRANSTHORACIC ECHOCARDIOGRAM  09/19/2011   EF >82%, stage 1 diastolic dysfunction, mild tricuspid valve regurg    Allergies  Allergies  Allergen Reactions  . Prednisone     REACTION: Increase eye pressure with gloucoma. Broke out in rash after injection per Dr. Percell Miller  . Adhesive [Tape]     Causes blisters/irritates skin  . Penicillins     REACTION: Rash  . Sulfonamide Derivatives     REACTION: Rash - not sure    History of Present Illness    83 year old female with a history of labile hypertension, anxiety, bronchiectasis, chronic dyspnea, GERD, goiter, hyperlipidemia, and diastolic dysfunction.  She is previously undergone evaluation in the setting of chronic dyspnea with stress testing and echocardiography in March 2015.  Stress testing was nonischemic, while echo showed normal LV function and grade 1 diastolic dysfunction.  In the setting of chronic dyspnea and bronchiectasis, she has been followed closely  by pulmonology.  She is not particularly active as result of her dyspnea.  She was in her usual state of health until about a week ago, when she began to notice markedly elevated blood pressures, especially first thing in the morning, associated with chest tightness and headaches.  Pressures had been in the 200s.  Of note, she was previously on both Toprol 100 mg in the evening and losartan 100 mg in the day however, she came off of losartan sometime in the spring or summer in the setting of soft blood pressures.  In the setting of recent elevated blood pressures, she initially resumed losartan half a tablet (50 mg) but over the past 2 days is taken 100 mg.  This morning, she called the office and reported a blood  pressure of 220/107 with chest pain and headache.  She was advised to present to the emergency department.  She refused and instead requested to come to clinic today.  Blood pressure here was 130/70 and she is currently asymptomatic.  She denies palpitations, PND, orthopnea, dizziness, syncope, edema, or early satiety.  She has not been experiencing chest pain in the absence of known elevation of blood pressures.  Home Medications    Prior to Admission medications   Medication Sig Start Date End Date Taking? Authorizing Provider  acetaminophen (TYLENOL) 500 MG tablet Take 500 mg by mouth as needed.   Yes [provider]  Alpha-D-Galactosidase Satira Mccallum) TABS Take 1 tablet by mouth as needed (for gas).  03/30/11  Yes Parrett, Tammy S, NP  brimonidine (ALPHAGAN) 0.2 % ophthalmic solution Place 1 drop into both eyes 2 (two) times daily. Place 1 drop into both eyes 2 (two) times daily. 12/21/15  Yes [provider]  cetirizine (ZYRTEC) 10 MG tablet Take 10 mg by mouth daily.  03/30/11  Yes Parrett, Tammy S, NP  dorzolamide (TRUSOPT) 2 % ophthalmic solution Place 1 drop into both eyes 2 (two) times daily. Place 1 drop into both eyes 2 (two) times daily. 11/03/15  Yes [provider]  fluticasone-salmeterol (ADVAIR HFA) 115-21 MCG/ACT inhaler Inhale 2 puffs into the lungs 2 (two) times daily. 02/18/18  Yes Juanito Doom, MD  Hyoscyamine Sulfate SL (LEVSIN/SL) 0.125 MG SUBL Take 1 table by mouth twice daily as needed for IBS/diarrhea. 04/24/18  Yes Esterwood, Amy S, PA-C  ipratropium (ATROVENT) 0.03 % nasal spray Place 2 sprays into both nostrils every 12 (twelve) hours. 02/18/18  Yes Juanito Doom, MD  levothyroxine (SYNTHROID, LEVOTHROID) 50 MCG tablet Take 50 mcg by mouth daily before breakfast.   Yes [provider]  LORazepam (ATIVAN) 0.5 MG tablet Take 0.5 mg by mouth 2 (two) times daily as needed (tremors).   Yes [provider]  Multiple Vitamins-Minerals  (MULTIVITAMIN WITH MINERALS) tablet Take 1 tablet by mouth daily.     Yes [provider]  pantoprazole (PROTONIX) 40 MG tablet Take 1 tablet (40 mg total) by mouth daily before breakfast. 06/20/18  Yes Gatha Mayer, MD  Polyethyl Glycol-Propyl Glycol (SYSTANE OP) Apply to eye daily.   Yes [provider]  pravastatin (PRAVACHOL) 40 MG tablet Take 1 tablet (40 mg total) by mouth at bedtime. 03/30/11  Yes Parrett, Fonnie Mu, NP  Respiratory Therapy Supplies (FLUTTER) DEVI Use as directed. 05/05/15  Yes Juanito Doom, MD  sertraline (ZOLOFT) 100 MG tablet Take 1 tablet (100 mg total) by mouth at bedtime. 2 at bedtime 03/30/11  Yes Parrett, Fonnie Mu, NP  Simethicone (GAS  RELIEF PO) Take by mouth as needed.   Yes [provider]  sodium chloride (OCEAN NASAL SPRAY) 0.65 % nasal spray Place 2 sprays into both nostrils at bedtime. Use as needed 03/30/11  Yes Parrett, Tammy S, NP  sucralfate (CARAFATE) 1 GM/10ML suspension Take 1 gram between meals and at bedtime. 03/31/18  Yes Esterwood, Amy S, PA-C  traMADol (ULTRAM) 50 MG tablet Take 50 mg by mouth 2 (two) times daily as needed (pain).    Yes [provider]  traZODone (DESYREL) 50 MG tablet Take 50 mg by mouth at bedtime.     Yes [provider]  triamcinolone (NASACORT ALLERGY 24HR) 55 MCG/ACT AERO nasal inhaler Place 1 spray into the nose daily.   Yes [provider]  ALPRAZolam Duanne Moron) 1 MG tablet Take 1 tablet (1 mg total) by mouth at bedtime as needed. 03/30/11   Parrett, Fonnie Mu, NP  carvedilol (COREG) 12.5 MG tablet Take 1 tablet (12.5 mg total) by mouth 2 (two) times daily. 11/04/18 02/02/19  Theora Gianotti, NP  cyclobenzaprine (FLEXERIL) 10 MG tablet Take 10 mg by mouth 3 (three) times daily as needed for muscle spasms.    [provider]  losartan (COZAAR) 100 MG tablet Take 1 tablet (100 mg total) by mouth daily. 11/04/18 02/02/19  Theora Gianotti, NP    Review of  Systems    Chronic dyspnea on exertion with recent market elevation in blood pressures associated with chest tightness.  She denies palpitations, PND, orthopnea, dizziness, syncope, edema, or early satiety.  All other systems reviewed and are otherwise negative except as noted above.  Physical Exam    VS:  BP 130/70 (BP Location: Left Arm, Patient Position: Sitting, Cuff Size: Normal)   Pulse 70   Ht 5\' 8"  (1.727 m)   Wt 155 lb (70.3 kg)   BMI 23.57 kg/m  , BMI Body mass index is 23.57 kg/m. GEN: Well nourished, well developed, in no acute distress. HEENT: normal. Neck: Supple, no JVD, carotid bruits, or masses. Cardiac: RRR, no murmurs, rubs, or gallops. No clubbing, cyanosis, edema.  Radials/DP/PT 2+ and equal bilaterally.  Respiratory:  Respirations regular and unlabored, clear to auscultation bilaterally. GI: Soft, nontender, nondistended, BS + x 4. MS: no deformity or atrophy. Skin: warm and dry, no rash. Neuro:  Strength and sensation are intact. Psych: Normal affect.  Accessory Clinical Findings    ECG personally reviewed by me today -regular sinus rhythm, 70, leftward axis, prior septal infarct- no acute changes.  Assessment & Plan    1.  Labile hypertension: Patient had come off of losartan earlier this year in the setting of soft blood pressures but over the past week or so, she has had sudden onset of markedly elevated blood pressures with systolics in the 102V.  When this occurs, she feels chest discomfort and also headache.  In the setting of elevated blood pressures, she resumed losartan therapy and is currently taking Toprol-XL 100 mg in the evening and losartan 100 mg in the morning.  Her blood pressure in clinic today is 130/70.  Both she and her daughter fear that pressure will rise later in the day.  I have asked her to continue losartan 100 mg daily and I will switch Toprol to carvedilol 12.5 mg twice daily.  I will follow-up CBC, basic metabolic panel, and TSH  today.  Given abrupt change in blood pressures, I will arrange for a renal artery duplex to rule out renal artery stenosis.  She was counseled on the importance of avoiding salt and processed foods.  He will continue to follow her blood pressures at home and contact us for titration of carvedilol if pressures remain elevated.  If another agent is required, it is notable that she did not tolerate amlodipine in the past secondary to lower extremity swelling.  Thus I would likely consider chlorthalidone next.  2.  Chest pain: Patient has been having chest pain in the setting of elevated blood pressures.  She has chronic dyspnea and says she was recently told by her lung doctor that this is not secondary to her lungs.  I will arrange for a Lexiscan Myoview to rule out ischemia.  3.  Chronic dyspnea on exertion: Slightly worsened in the setting of higher blood pressures.  We will see if symptoms improve with blood pressure management.  Following up Myoview.  Will consider echo if EF down on Myoview.  4.  Disposition: Follow-up labs today.  Follow-up renal arterial duplex and Lexiscan Myoview.  Follow-up in clinic in approximately 2 weeks or sooner if necessary.   Murray Hodgkins, NP 11/04/2018, 4:16 PM

## 2018-11-05 ENCOUNTER — Telehealth: Payer: Self-pay | Admitting: Nurse Practitioner

## 2018-11-05 ENCOUNTER — Ambulatory Visit: Payer: Self-pay | Admitting: Nurse Practitioner

## 2018-11-05 DIAGNOSIS — R0989 Other specified symptoms and signs involving the circulatory and respiratory systems: Secondary | ICD-10-CM

## 2018-11-05 DIAGNOSIS — Z79899 Other long term (current) drug therapy: Secondary | ICD-10-CM

## 2018-11-05 DIAGNOSIS — R0602 Shortness of breath: Secondary | ICD-10-CM

## 2018-11-05 LAB — CBC WITH DIFFERENTIAL/PLATELET

## 2018-11-05 LAB — BASIC METABOLIC PANEL
BUN / CREAT RATIO: 14 (ref 12–28)
BUN: 13 mg/dL (ref 8–27)
CO2: 24 mmol/L (ref 20–29)
CREATININE: 0.9 mg/dL (ref 0.57–1.00)
Calcium: 10 mg/dL (ref 8.7–10.3)
Chloride: 101 mmol/L (ref 96–106)
GFR, EST AFRICAN AMERICAN: 69 mL/min/{1.73_m2} (ref >59)
GFR, EST NON AFRICAN AMERICAN: 60 mL/min/{1.73_m2} (ref >59)
Glucose: 93 mg/dL (ref 65–99)
Potassium: 5.4 mmol/L — ABNORMAL HIGH (ref 3.5–5.2)
SODIUM: 140 mmol/L (ref 134–144)

## 2018-11-05 NOTE — Telephone Encounter (Signed)
Notes recorded by Theora Gianotti, NP on 11/05/2018 at 10:45 AM EST Potassium is mildly elevated. She recently resumed losartan. I'd like her to have a f/u bmet either today or tomorrow @ the [facility] lab to determine if this is truly elevated. If K is truly elevated, we would need to stop losartan and change to something else.

## 2018-11-05 NOTE — Telephone Encounter (Signed)
I spoke with the patient regarding her BMP results. I have advised her of Ignacia Bayley, NP's recommendations to have a repeat BMP done either this afternoon/ tomorrow at the [facility].  She states she doesn't feel well enough to come this afternoon, but she thinks her daughter can bring her tomorrow.   Her CBC was cancelled per the lab yesterday- per comments "no lavendar tube was submitted." I will add on a CBC to be done tomorrow as well.  The patient also states she was debating on calling today as her BP was elevated this morning. She woke up at 5:45 am not feeling well and her BP so she began checking her BP readings:  5:45 am- 188/104 - she took losartan 50 mg at that time 8:15 am- 203/114- she took coreg 12.5 mg at this time (or ~ 8:30 am) 10:30 am- 175/88 (77) 12:00 pm- she took an additional losartan 50 mg 1:00 pm- 114/64 (91)  The patient states her head has felt terrible all day. She is getting ready to take a tylenol. She denies dizziness. She states she just doesn't know what to do about her BP readings. I advised I will update Ignacia Bayley, NP of her readings today, however, she has been advised she may also report to the ER if she is concerned.   To Ignacia Bayley, NP to review.  BMP/ CBC ordered.

## 2018-11-05 NOTE — Telephone Encounter (Signed)
Can increase carvedilol to 18.75mg  (1.5, 12.5mg  tabs).  Would not take more than one 100mg  losartan daily - esp with mild K elevation.

## 2018-11-06 ENCOUNTER — Emergency Department: Payer: PPO

## 2018-11-06 ENCOUNTER — Encounter: Payer: Self-pay | Admitting: Emergency Medicine

## 2018-11-06 ENCOUNTER — Other Ambulatory Visit: Payer: Self-pay

## 2018-11-06 ENCOUNTER — Emergency Department
Admission: EM | Admit: 2018-11-06 | Discharge: 2018-11-06 | Disposition: A | Discharge status: Home / Self Care | Payer: PPO | Attending: Emergency Medicine | Admitting: Emergency Medicine

## 2018-11-06 DIAGNOSIS — E039 Hypothyroidism, unspecified: Secondary | ICD-10-CM | POA: Diagnosis not present

## 2018-11-06 DIAGNOSIS — Z79899 Other long term (current) drug therapy: Secondary | ICD-10-CM | POA: Insufficient documentation

## 2018-11-06 DIAGNOSIS — I10 Essential (primary) hypertension: Secondary | ICD-10-CM | POA: Diagnosis not present

## 2018-11-06 DIAGNOSIS — R51 Headache: Secondary | ICD-10-CM | POA: Diagnosis not present

## 2018-11-06 DIAGNOSIS — R42 Dizziness and giddiness: Secondary | ICD-10-CM | POA: Insufficient documentation

## 2018-11-06 DIAGNOSIS — E119 Type 2 diabetes mellitus without complications: Secondary | ICD-10-CM | POA: Insufficient documentation

## 2018-11-06 LAB — CBC WITH DIFFERENTIAL/PLATELET
Abs Immature Granulocytes: 0.02 10*3/uL (ref 0.00–0.07)
Basophils Absolute: 0 10*3/uL (ref 0.0–0.1)
Basophils Relative: 1 %
Eosinophils Absolute: 0.2 10*3/uL (ref 0.0–0.5)
Eosinophils Relative: 4 %
HCT: 43.8 % (ref 36.0–46.0)
Hemoglobin: 14.5 g/dL (ref 12.0–15.0)
Immature Granulocytes: 0 %
Lymphocytes Relative: 20 %
Lymphs Abs: 1.1 10*3/uL (ref 0.7–4.0)
MCH: 29.8 pg (ref 26.0–34.0)
MCHC: 33.1 g/dL (ref 30.0–36.0)
MCV: 89.9 fL (ref 80.0–100.0)
MONOS PCT: 6 %
Monocytes Absolute: 0.3 10*3/uL (ref 0.1–1.0)
Neutro Abs: 3.6 10*3/uL (ref 1.7–7.7)
Neutrophils Relative %: 69 %
Platelets: 226 10*3/uL (ref 150–400)
RBC: 4.87 MIL/uL (ref 3.87–5.11)
RDW: 12.7 % (ref 11.5–15.5)
WBC: 5.3 10*3/uL (ref 4.0–10.5)
nRBC: 0 % (ref 0.0–0.2)

## 2018-11-06 LAB — BASIC METABOLIC PANEL
Anion gap: 7 (ref 5–15)
BUN: 12 mg/dL (ref 8–23)
CO2: 28 mmol/L (ref 22–32)
CREATININE: 0.77 mg/dL (ref 0.44–1.00)
Calcium: 9.3 mg/dL (ref 8.9–10.3)
Chloride: 104 mmol/L (ref 98–111)
GFR calc Af Amer: >60 mL/min (ref >60)
GFR calc non Af Amer: >60 mL/min (ref >60)
Glucose, Bld: 103 mg/dL — ABNORMAL HIGH (ref 70–99)
Potassium: 3.9 mmol/L (ref 3.5–5.1)
Sodium: 139 mmol/L (ref 135–145)

## 2018-11-06 LAB — TROPONIN I
Troponin I: <0.03 ng/mL (ref <0.03)
Troponin I: <0.03 ng/mL (ref <0.03)

## 2018-11-06 MED ORDER — HYDRALAZINE HCL 25 MG PO TABS: 50.0000 mg | ORAL_TABLET | Freq: Four times a day (QID) | ORAL | 0 refills | Status: DC | PRN

## 2018-11-06 NOTE — ED Notes (Signed)
Pt assisted to bathroom

## 2018-11-06 NOTE — ED Notes (Signed)
Checked on pt and introduced myself. Pt resting comfortably. Reports a headache. BP 154/76. Call bell within reach. Family at bedside.

## 2018-11-06 NOTE — Discharge Instructions (Addendum)
You are evaluated for high blood pressure, and your exam and evaluation is overall reassuring in the emerge department today.  Return to the emergency room immediately for any worsening condition including slurred speech, altered mental status, chest pain, weakness, numbness, or any other symptoms concerning to you.  Take losartain 100mg  in the evening.  Take your carvedolol 12.5mg  twice daily.  You have a new prescription for hydralazine 50mg  that you could take just before bed or every 6 hours for elevated blood pressure.

## 2018-11-06 NOTE — Telephone Encounter (Signed)
Reviewing the patient's chart, she was seen in the ER this morning for further evaluation of her elevated BP. Per ER physician note, Dr. Rockey Situ was consulted and hydralazine was added to her medication regimen.   Lab redraw today did show a K+ of 3.9.  To Ignacia Bayley, NP as an Juluis Rainier.

## 2018-11-06 NOTE — ED Provider Notes (Signed)
[facility] Emergency Department Provider Note ____________________________________________   I have reviewed the triage vital signs and the triage nursing note.  HISTORY  Chief Complaint Hypertension   Historian Patient and daughter  HPI Christy Richardson is a 83 y.o. female presents from home, she is currently staying with her daughter although she typically lives independently, presents for high blood pressure upon waking this morning as well as most mornings for the past week or so.  Reports long-standing history of hypertension, follows with cardiology office of Dr. Rockey Situ, most recently been seen by advanced practice practitioner for adjustment of medications secondary to complaint of hypertension.  Patient reports that most days of the last week and a half she has woken up around 4 AM to go the bathroom which is typical for her, she notes that she feels pressure in her head, a sensation of lightheadedness or dizziness without any focal neurologic deficits such as weakness or numbness or confusion or speech problems, and also at times feels a chest pressure at the top of her chest.  This prompted her to take her blood pressure, and has been significantly elevated for her in the 200s to 230s over 100-1 30s.  She was just recently changed from Toprol to carvedilol in the evenings, starting at 12.5 mg.  She is also on losartan 100 mg daily although she has been taking it in split doses to help address the elevated blood pressure.  She was told that she had elevated potassium on blood work drawn on Tuesday and was supposed to have outpatient blood draw today per the note.  Patient decided to come to the ER this morning because of concern about this ongoing issue of high blood pressures with symptoms in the mornings.  She did take losartan this morning and now in the emergency department her blood pressure is much better.  States that while she was here she did  have one "twinge" of chest discomfort, but no associated nausea or dizziness or shortness of breath.  She does report that she has a fair amount of stress and is a Research officer, trade [employer], but does not think it is necessarily any worse than chronic.     Past Medical History:  Diagnosis Date  . Adenomatous polyp of colon 03/2007  . Allergic rhinitis   . Allergy    has a lot congestion  . Anxiety   . Blood transfusion 1974  . Bronchiectasis ([place])   . Cancer ([place])    left arm  . Depression   . Diabetes mellitus without complication (HCC)    no meds  . Diastolic dysfunction    a. 01/2014 Echo: EF 60-65%, no rwma, Gr1 DD, Ao sclerosis w/o stenosis. Mild TR.  . Diverticulosis of colon (without mention of hemorrhage)   . DOE (dyspnea on exertion)    per pt SOB for years  . Fatigue   . Fecal incontinence   . GERD (gastroesophageal reflux disease)   . Glaucoma   . Goiter    right side  . Heart murmur   . History of stress test    a. 01/2014 Lexi MV: no ischemia/infarct.  . Hyperlipidemia   . Hypertension   . Hypothyroidism   . IBS (irritable bowel syndrome)   . Migraine   . Mitral valve prolapse    a. 01/2014 Echo: no MR/MVP noted.  . Neck mass    right side/ no airway problems/ per pt has had since 2005  . Nonspecific abnormal electrocardiogram (ECG) (  EKG)   . Palpitations   . Palpitations    a. 01/2014 CardioNet: wore for 9 days (rash). Sinus tach - 100's. No SVT/AF.  Marland Kitchen Polyp, sigmoid colon   . Post-operative nausea and vomiting   . Pulmonary nodules   . Recurrent UTI   . Tremor     Patient Active Problem List   Diagnosis Date Noted  . Sinus tachycardia 02/04/2018  . Hyperlipidemia LDL goal <70 08/16/2015  . Left ventricular diastolic dysfunction, NYHA class 1 03/13/2014  . Chest pain 01/30/2014  . Tremor, essential 05/07/2013  . Fecal incontinence 09/30/2012  . Pulmonary nodules 05/15/2012  . Dyspnea 05/27/2011  . Bronchiectasis without acute exacerbation ([place]) 09/26/2009  .  OSTEOPENIA 03/17/2009  . COLONIC POLYPS, ADENOMATOUS, HX OF 09/27/2008  . BACK PAIN 09/01/2008  . LIPOMA 06/01/2008  . UTI'S, RECURRENT 06/10/2007  . Hypothyroidism 01/07/2007  . Hyperlipidemia 01/07/2007  . Anxiety 01/07/2007  . DEPRESSION 01/07/2007  . MIGRAINE HEADACHE 01/07/2007  . GLAUCOMA NEC 01/07/2007  . DECREASED HEARING 01/07/2007  . Essential hypertension 01/07/2007  . Allergic rhinitis 01/07/2007  . GERD 01/07/2007  . IBS 01/07/2007  . OSTEOARTHRITIS 01/07/2007    Past Surgical History:  Procedure Laterality Date  . ABDOMINAL HYSTERECTOMY    . CARDIOVASCULAR STRESS TEST  09/19/2011   No scintigraphic evidence of inducible myocardial ischemia. Pharmacological stress test without chest pain or EKG changes for ischemia.  . COLONOSCOPY    . ESOPHAGOGASTRODUODENOSCOPY    . MASTOIDECTOMY     rt ear/ as a teenager  . OOPHORECTOMY    . SKIN CANCER EXCISION Left    arm  . TOTAL HIP ARTHROPLASTY  5/10   left  . TRANSTHORACIC ECHOCARDIOGRAM  09/19/2011   EF >60%, stage 1 diastolic dysfunction, mild tricuspid valve regurg    Prior to Admission medications   Medication Sig Start Date End Date Taking? Authorizing Provider  acetaminophen (TYLENOL) 500 MG tablet Take 500 mg by mouth as needed.   Yes [provider]  Alpha-D-Galactosidase Satira Mccallum) TABS Take 1 tablet by mouth as needed (for gas).  03/30/11  Yes Parrett, Tammy S, NP  brimonidine (ALPHAGAN) 0.2 % ophthalmic solution Place 1 drop into both eyes 2 (two) times daily. Place 1 drop into both eyes 2 (two) times daily. 12/21/15  Yes [provider]  carvedilol (COREG) 12.5 MG tablet Take 1 tablet (12.5 mg total) by mouth 2 (two) times daily. 11/04/18 02/02/19 Yes Theora Gianotti, NP  cetirizine (ZYRTEC) 10 MG tablet Take 10 mg by mouth daily.  03/30/11  Yes Parrett, Tammy S, NP  dorzolamide (TRUSOPT) 2 % ophthalmic solution Place 1 drop into both eyes 2 (two) times daily. Place 1 drop into both eyes  2 (two) times daily. 11/03/15  Yes [provider]  fluticasone-salmeterol (ADVAIR HFA) 115-21 MCG/ACT inhaler Inhale 2 puffs into the lungs 2 (two) times daily. 02/18/18  Yes Juanito Doom, MD  Hyoscyamine Sulfate SL (LEVSIN/SL) 0.125 MG SUBL Take 1 table by mouth twice daily as needed for IBS/diarrhea. 04/24/18  Yes Esterwood, Amy S, PA-C  ipratropium (ATROVENT) 0.03 % nasal spray Place 2 sprays into both nostrils every 12 (twelve) hours. 02/18/18  Yes Juanito Doom, MD  levothyroxine (SYNTHROID, LEVOTHROID) 50 MCG tablet Take 50 mcg by mouth daily before breakfast.   Yes [provider]  LORazepam (ATIVAN) 0.5 MG tablet Take 0.5 mg by mouth 2 (two) times daily as needed (tremors).   Yes [provider]  losartan (COZAAR) 100  MG tablet Take 1 tablet (100 mg total) by mouth daily. 11/04/18 02/02/19 Yes Theora Gianotti, NP  pantoprazole (PROTONIX) 40 MG tablet Take 1 tablet (40 mg total) by mouth daily before breakfast. 06/20/18  Yes Gatha Mayer, MD  Polyethyl Glycol-Propyl Glycol (SYSTANE OP) Apply to eye daily.   Yes [provider]  pravastatin (PRAVACHOL) 40 MG tablet Take 1 tablet (40 mg total) by mouth at bedtime. 03/30/11  Yes Parrett, Tammy S, NP  sertraline (ZOLOFT) 100 MG tablet Take 1 tablet (100 mg total) by mouth at bedtime. 2 at bedtime 03/30/11  Yes Parrett, Tammy S, NP  sodium chloride (OCEAN NASAL SPRAY) 0.65 % nasal spray Place 2 sprays into both nostrils at bedtime. Use as needed 03/30/11  Yes Parrett, Tammy S, NP  traMADol (ULTRAM) 50 MG tablet Take 50 mg by mouth 2 (two) times daily as needed (pain).    Yes [provider]  traZODone (DESYREL) 50 MG tablet Take 50 mg by mouth at bedtime.     Yes [provider]  triamcinolone (NASACORT ALLERGY 24HR) 55 MCG/ACT AERO nasal inhaler Place 1 spray into the nose daily.   Yes [provider]  ALPRAZolam Duanne Moron) 1 MG tablet Take 1 tablet (1 mg total) by mouth at  bedtime as needed. 03/30/11   Parrett, Fonnie Mu, NP  cyclobenzaprine (FLEXERIL) 10 MG tablet Take 10 mg by mouth 3 (three) times daily as needed for muscle spasms.    [provider]  hydrALAZINE (APRESOLINE) 25 MG tablet Take 2 tablets (50 mg total) by mouth every 6 (six) hours as needed. 11/06/18   Lisa Roca, MD  Multiple Vitamins-Minerals (MULTIVITAMIN WITH MINERALS) tablet Take 1 tablet by mouth daily.      [provider]  sucralfate (CARAFATE) 1 GM/10ML suspension Take 1 gram between meals and at bedtime. Patient not taking: Reported on 11/06/2018 03/31/18   Alfredia Ferguson, PA-C    Allergies  Allergen Reactions  . Prednisone     REACTION: Increase eye pressure with gloucoma. Broke out in rash after injection per Dr. Percell Miller  . Adhesive [Tape]     Causes blisters/irritates skin  . Penicillins     REACTION: Rash  . Sulfonamide Derivatives     REACTION: Rash - not sure    Family History  Problem Relation Age of Onset  . Diabetes Father   . Throat cancer Mother   . Coronary artery disease Mother   . Prostate cancer Brother   . Asthma Daughter   . Scoliosis Daughter   . Colon cancer Paternal Grandfather   . Esophageal cancer Neg Hx   . Rectal cancer Neg Hx   . Stomach cancer Neg Hx     Social History Social History   Tobacco Use  . Smoking status: Never Smoker  . Smokeless tobacco: Never Used  Substance Use Topics  . Alcohol use: Yes    Comment: rare  . Drug use: No    Review of Systems  Constitutional: Negative for fever. Eyes: Negative for visual changes. ENT: Negative for sore throat. Cardiovascular: Negative for chest pain. Respiratory: Negative for shortness of breath. Gastrointestinal: Negative for abdominal pain, vomiting and diarrhea. Genitourinary: Negative for dysuria. Musculoskeletal: Negative for back pain. Skin: Negative for rash. Neurological: Negative for headache.  ____________________________________________   PHYSICAL  EXAM:  VITAL SIGNS: ED Triage Vitals  Enc Vitals Group     BP 11/06/18 0618 (!) 187/77     Pulse Rate 11/06/18 0618 75  Resp 11/06/18 0618 17     Temp 11/06/18 0618 (!) 97.5 F (36.4 C)     Temp Source 11/06/18 0618 Oral     SpO2 11/06/18 0618 100 %     Weight 11/06/18 0607 155 lb (70.3 kg)     Height 11/06/18 0607 5\' 8"  (1.727 m)     Head Circumference --      Peak Flow --      Pain Score 11/06/18 0607 7     Pain Loc --      Pain Edu? --      Excl. in [place]? --      Constitutional: Alert and oriented.  HEENT      Head: Normocephalic and atraumatic.      Eyes: Conjunctivae are normal. Pupils equal and round.       Ears:         Nose: No congestion/rhinnorhea.      Mouth/Throat: Mucous membranes are moist.      Neck: No stridor. Cardiovascular/Chest: Normal rate, regular rhythm.  No murmurs, rubs, or gallops. Respiratory: Normal respiratory effort without tachypnea nor retractions. Breath sounds are clear and equal bilaterally. No wheezes/rales/rhonchi. Gastrointestinal: Soft. No distention, no guarding, no rebound. Nontender.    Genitourinary/rectal:Deferred Musculoskeletal: Nontender with normal range of motion in all extremities. No joint effusions.  No lower extremity tenderness.  No edema. Neurologic:  Normal speech and language. No gross or focal neurologic deficits are appreciated. Skin:  Skin is warm, dry and intact. No rash noted. Psychiatric: Mood and affect are normal. Speech and behavior are normal. Patient exhibits appropriate insight and judgment.   ____________________________________________  LABS (pertinent positives/negatives) I, Lisa Roca, MD the attending physician have reviewed the labs noted below.  Labs Reviewed  BASIC METABOLIC PANEL - Abnormal; Notable for the following components:      Result Value   Glucose, Bld 103 (*)    All other components within normal limits  CBC WITH DIFFERENTIAL/PLATELET  TROPONIN I  TROPONIN I     ____________________________________________    EKG I, Lisa Roca, MD, the attending physician have personally viewed and interpreted all ECGs.  75 beats minute.  Normal sinus rhythm.  Narrow QS per normal axis.  Nonspecific ST, similar to prior ____________________________________________  RADIOLOGY   CT head without contrast:  IMPRESSION: Probable small left frontal chronic subdural hygroma. No other significant intracranial abnormality is noted. __________________________________________  PROCEDURES  Procedure(s) performed: None  Procedures  Critical Care performed: None   ____________________________________________  ED COURSE / ASSESSMENT AND PLAN  Pertinent labs & imaging results that were available during my care of the patient were reviewed by me and considered in my medical decision making (see chart for details).     Patient is here with elevated blood pressures early in the morning for the last week or so.  At the time she has some associated symptoms of dizziness and headache, chest pressure, sounds like these could be secondary to high blood pressure.  We did a thorough evaluation and her examination is reassuring.  Her blood pressures have not been elevated here.  After speaking with patient's cardiologist, we did add hydralazine as needed.    CONSULTATIONS:   Gollan, patient's cardiologist, we discussed medication management and added hydralazine as as needed for elevated blood pressures.   Patient / Family / Caregiver informed of clinical course, medical decision-making process, and agree with plan.   I discussed return precautions, follow-up instructions, and discharge instructions with patient and/or family.  Discharge Instructions : You are evaluated for high blood pressure, and your exam and evaluation is overall reassuring in the emerge department today.  Return to the emergency room immediately for any worsening condition including  slurred speech, altered mental status, chest pain, weakness, numbness, or any other symptoms concerning to you.  Take losartain 100mg  in the evening.  Take your carvedolol 12.5mg  twice daily.  You have a new prescription for hydralazine 50mg  that you could take just before bed or every 6 hours for elevated blood pressure.    ___________________________________________   FINAL CLINICAL IMPRESSION(S) / ED DIAGNOSES   Final diagnoses:  Hypertension, unspecified type      ___________________________________________         Note: This dictation was prepared with Dragon dictation. Any transcriptional errors that result from this process are unintentional    Lisa Roca, MD 11/06/18 1354

## 2018-11-06 NOTE — ED Notes (Signed)
Pt states her blood pressure has been more elevated than normal since Saturday. (220/130 at its highest.) pt states she always develops a headache when her blood pressure is high. Pt reports she woke up this morning around 0400 with a headache and her blood pressure was 213/125 and pt reports having a "pounding headache". Took her blood pressure medication at that time and states she is now feeling much better with both her headache and blood pressure improving. Pt has clear speech and able to answer all questions without difficulty. No distress noted. Denies chest pain or dizziness at this time.

## 2018-11-06 NOTE — ED Notes (Addendum)
Pt given food and drink. Updated on plan for second troponin.

## 2018-11-06 NOTE — ED Triage Notes (Signed)
Patient ambulatory to triage with steady gait, without difficulty or distress noted; pt reports elevated BP x wk accomp by generalized HA, heart "pounding" and weakness

## 2018-11-08 DIAGNOSIS — I10 Essential (primary) hypertension: Secondary | ICD-10-CM | POA: Diagnosis not present

## 2018-11-08 DIAGNOSIS — E782 Mixed hyperlipidemia: Secondary | ICD-10-CM | POA: Diagnosis not present

## 2018-11-11 NOTE — Telephone Encounter (Signed)
Concern for anxiety playing a role in her symptoms As she talked about this with primary care Numbers are labile and cannot be well controlled if she is anxious about her health  Now most recent numbers almost looking little bit too low We can overmedicate her, drop blood pressure low if we do not get the anxiety under control

## 2018-11-11 NOTE — Telephone Encounter (Signed)
Call from pt this am,  Having inc HR despite taking all medications as indicated.  She was seen in ED on 12/5 for HTN w home values of systolic in 914C and diastolic in 458A.    She reports headache and "pulse in head" since her pressure was high despite BP being at BL since.  She denies chest pain or any other complaints other than headache.  SOB is at her BL.    Vitals this morning are as follows; BP 129/63 HR 109 8AM, 124/63 HR 101 10 AM, current is 109/65 HR 105.   Message forwarded to provider for further management.

## 2018-11-11 NOTE — Telephone Encounter (Signed)
STAT if HR is under 50 or over 120 (normal HR is 60-100 beats per minute)  What is your heart rate? 101 1) Do you have a log of your heart rate readings (document readings)?  Varies per patient 80's -100's   2) Do you have any other symptoms? Headache shaky inside

## 2018-11-12 NOTE — Telephone Encounter (Signed)
I spoke with the patient. She is aware of Dr. Donivan Scull concern that some underlying anxiety may be driving up her BP/ HR's. She is aware we need to be cautious not to overmedicate her if anxiety is playing a role in her readings.  The patient states she does have lorazepam to take as needed.  She feels her BP/ HR readings have been a little better today:  8:00 am- 162/85 (89) 12:11 pm- 114/62 (79) 3:30 pm- 128/70 (83)  The patient states she has felt a little "swimmy headed" today. I have asked that she only try to take her BP/ HR in the AM & in the PM about 30 minutes - 1 hour after she takes her medications and no more. I advised she should only check her readings more often during the day if she does not feel well. The patient voices understanding and will continue to monitor her BP/ HR as recommended. She will call with any further questions/ concerns.

## 2018-11-12 NOTE — Telephone Encounter (Signed)
I left a message for the patient to call. 

## 2018-11-19 ENCOUNTER — Telehealth: Payer: Self-pay | Admitting: Cardiovascular Disease

## 2018-11-19 NOTE — Telephone Encounter (Signed)
Patient has a dental appt for an issue with a crown and wants to clarify it will not affect upcoming renal appt.

## 2018-11-24 ENCOUNTER — Ambulatory Visit (INDEPENDENT_AMBULATORY_CARE_PROVIDER_SITE_OTHER): Payer: PPO

## 2018-11-24 DIAGNOSIS — R0989 Other specified symptoms and signs involving the circulatory and respiratory systems: Secondary | ICD-10-CM

## 2018-11-28 ENCOUNTER — Ambulatory Visit: Payer: Self-pay | Admitting: Nurse Practitioner

## 2018-11-30 ENCOUNTER — Ambulatory Visit (HOSPITAL_COMMUNITY)
Admission: EM | Admit: 2018-11-30 | Discharge: 2018-11-30 | Disposition: A | Discharge status: Home / Self Care | Payer: PPO | Attending: Family Medicine | Admitting: Family Medicine

## 2018-11-30 ENCOUNTER — Encounter (HOSPITAL_COMMUNITY): Payer: Self-pay

## 2018-11-30 DIAGNOSIS — R531 Weakness: Secondary | ICD-10-CM

## 2018-11-30 DIAGNOSIS — R5383 Other fatigue: Secondary | ICD-10-CM | POA: Diagnosis not present

## 2018-11-30 DIAGNOSIS — R69 Illness, unspecified: Secondary | ICD-10-CM

## 2018-11-30 DIAGNOSIS — R062 Wheezing: Secondary | ICD-10-CM

## 2018-11-30 DIAGNOSIS — R05 Cough: Secondary | ICD-10-CM

## 2018-11-30 DIAGNOSIS — R509 Fever, unspecified: Secondary | ICD-10-CM

## 2018-11-30 DIAGNOSIS — J111 Influenza due to unidentified influenza virus with other respiratory manifestations: Secondary | ICD-10-CM | POA: Insufficient documentation

## 2018-11-30 MED ORDER — ALBUTEROL SULFATE (2.5 MG/3ML) 0.083% IN NEBU
INHALATION_SOLUTION | RESPIRATORY_TRACT | Status: AC
  Filled 2018-11-30: qty 3

## 2018-11-30 MED ORDER — BENZONATATE 100 MG PO CAPS: 100.0000 mg | ORAL_CAPSULE | Freq: Three times a day (TID) | ORAL | 0 refills | Status: DC | PRN

## 2018-11-30 MED ORDER — ACETAMINOPHEN 325 MG PO TABS
ORAL_TABLET | ORAL | Status: AC
  Filled 2018-11-30: qty 2

## 2018-11-30 MED ORDER — ALBUTEROL SULFATE (2.5 MG/3ML) 0.083% IN NEBU: 2.5000 mg | INHALATION_SOLUTION | Freq: Four times a day (QID) | RESPIRATORY_TRACT | 12 refills | Status: DC | PRN

## 2018-11-30 MED ORDER — ACETAMINOPHEN 325 MG PO TABS
650.0000 mg | ORAL_TABLET | Freq: Once | ORAL | Status: AC
  Administered 2018-11-30: 650 mg via ORAL

## 2018-11-30 MED ORDER — ALBUTEROL SULFATE (2.5 MG/3ML) 0.083% IN NEBU
2.5000 mg | INHALATION_SOLUTION | Freq: Once | RESPIRATORY_TRACT | Status: AC
  Administered 2018-11-30: 2.5 mg via RESPIRATORY_TRACT

## 2018-11-30 MED ORDER — ONDANSETRON 4 MG PO TBDP
ORAL_TABLET | ORAL | Status: AC
  Filled 2018-11-30: qty 1

## 2018-11-30 NOTE — ED Provider Notes (Addendum)
Christy Richardson CARE    CSN: [id number] Arrival date & time: 11/30/18  1005     History   Chief Complaint Chief Complaint  Patient presents with  . Influenza    HPI Christy Richardson is a 83 y.o. female.   This is an 83 year old woman comes in for the first time for evaluation of ongoing fever, chills, generalized body aches, coughing, wheezing, chest congestion, weakness, and fatigue.  She called her primary care doctor who recommended that she be seen on Friday, but patient canceled her appointment.  She is continued to cough and has sore muscles in her chest.  She has been running a fever consistently for 72 hours.  She has had no vomiting.  Patient has tightness in her chest and wheezing.  She has an underlying problem bronchiectasis.  He has had adverse reactions to prednisone in the past and she is allergic to penicillin and sulfonamides.  Patient is also had a problem with codeine and hydrocodone.     Past Medical History:  Diagnosis Date  . Adenomatous polyp of colon 03/2007  . Allergic rhinitis   . Allergy    has a lot congestion  . Anxiety   . Blood transfusion 1974  . Bronchiectasis ([place])   . Cancer ([place])    left arm  . Depression   . Diabetes mellitus without complication (HCC)    no meds  . Diastolic dysfunction    a. 01/2014 Echo: EF 60-65%, no rwma, Gr1 DD, Ao sclerosis w/o stenosis. Mild TR.  . Diverticulosis of colon (without mention of hemorrhage)   . DOE (dyspnea on exertion)    per pt SOB for years  . Fatigue   . Fecal incontinence   . GERD (gastroesophageal reflux disease)   . Glaucoma   . Goiter    right side  . Heart murmur   . History of stress test    a. 01/2014 Lexi MV: no ischemia/infarct.  . Hyperlipidemia   . Hypertension   . Hypothyroidism   . IBS (irritable bowel syndrome)   . Migraine   . Mitral valve prolapse    a. 01/2014 Echo: no MR/MVP noted.  . Neck mass    right side/ no airway problems/ per pt has had since 2005  .  Nonspecific abnormal electrocardiogram (ECG) (EKG)   . Palpitations   . Palpitations    a. 01/2014 CardioNet: wore for 9 days (rash). Sinus tach - 100's. No SVT/AF.  Marland Kitchen Polyp, sigmoid colon   . Post-operative nausea and vomiting   . Pulmonary nodules   . Recurrent UTI   . Tremor     Patient Active Problem List   Diagnosis Date Noted  . Sinus tachycardia 02/04/2018  . Hyperlipidemia LDL goal <70 08/16/2015  . Left ventricular diastolic dysfunction, NYHA class 1 03/13/2014  . Chest pain 01/30/2014  . Tremor, essential 05/07/2013  . Fecal incontinence 09/30/2012  . Pulmonary nodules 05/15/2012  . Dyspnea 05/27/2011  . Bronchiectasis without acute exacerbation ([place]) 09/26/2009  . OSTEOPENIA 03/17/2009  . COLONIC POLYPS, ADENOMATOUS, HX OF 09/27/2008  . BACK PAIN 09/01/2008  . LIPOMA 06/01/2008  . UTI'S, RECURRENT 06/10/2007  . Hypothyroidism 01/07/2007  . Hyperlipidemia 01/07/2007  . Anxiety 01/07/2007  . DEPRESSION 01/07/2007  . MIGRAINE HEADACHE 01/07/2007  . GLAUCOMA NEC 01/07/2007  . DECREASED HEARING 01/07/2007  . Essential hypertension 01/07/2007  . Allergic rhinitis 01/07/2007  . GERD 01/07/2007  . IBS 01/07/2007  . OSTEOARTHRITIS 01/07/2007    Past Surgical  History:  Procedure Laterality Date  . ABDOMINAL HYSTERECTOMY    . CARDIOVASCULAR STRESS TEST  09/19/2011   No scintigraphic evidence of inducible myocardial ischemia. Pharmacological stress test without chest pain or EKG changes for ischemia.  . COLONOSCOPY    . ESOPHAGOGASTRODUODENOSCOPY    . MASTOIDECTOMY     rt ear/ as a teenager  . OOPHORECTOMY    . SKIN CANCER EXCISION Left    arm  . TOTAL HIP ARTHROPLASTY  5/10   left  . TRANSTHORACIC ECHOCARDIOGRAM  09/19/2011   EF >88%, stage 1 diastolic dysfunction, mild tricuspid valve regurg    OB History   No obstetric history on file.      Home Medications    Prior to Admission medications   Medication Sig Start Date End Date Taking? Authorizing  Provider  acetaminophen (TYLENOL) 500 MG tablet Take 500 mg by mouth as needed.    [provider]  albuterol (PROVENTIL) (2.5 MG/3ML) 0.083% nebulizer solution Take 3 mLs (2.5 mg total) by nebulization every 6 (six) hours as needed for wheezing or shortness of breath. 11/30/18   Robyn Haber, MD  Alpha-D-Galactosidase [facility]) TABS Take 1 tablet by mouth as needed (for gas).  03/30/11   Parrett, Fonnie Mu, NP  ALPRAZolam (XANAX) 1 MG tablet Take 1 tablet (1 mg total) by mouth at bedtime as needed. 03/30/11   Parrett, Fonnie Mu, NP  benzonatate (TESSALON) 100 MG capsule Take 1-2 capsules (100-200 mg total) by mouth 3 (three) times daily as needed for cough. 11/30/18   Robyn Haber, MD  brimonidine (ALPHAGAN) 0.2 % ophthalmic solution Place 1 drop into both eyes 2 (two) times daily. Place 1 drop into both eyes 2 (two) times daily. 12/21/15   [provider]  carvedilol (COREG) 12.5 MG tablet Take 1 tablet (12.5 mg total) by mouth 2 (two) times daily. 11/04/18 02/02/19  Theora Gianotti, NP  cetirizine (ZYRTEC) 10 MG tablet Take 10 mg by mouth daily.  03/30/11   Parrett, Fonnie Mu, NP  cyclobenzaprine (FLEXERIL) 10 MG tablet Take 10 mg by mouth 3 (three) times daily as needed for muscle spasms.    [provider]  dorzolamide (TRUSOPT) 2 % ophthalmic solution Place 1 drop into both eyes 2 (two) times daily. Place 1 drop into both eyes 2 (two) times daily. 11/03/15   [provider]  fluticasone-salmeterol (ADVAIR HFA) 115-21 MCG/ACT inhaler Inhale 2 puffs into the lungs 2 (two) times daily. 02/18/18   Juanito Doom, MD  hydrALAZINE (APRESOLINE) 25 MG tablet Take 2 tablets (50 mg total) by mouth 3 (three) times daily as needed (Take as needed for systolic blood pressure > 150.). 12/04/18   Dunn, Areta Haber, PA-C  Hyoscyamine Sulfate SL (LEVSIN/SL) 0.125 MG SUBL Take 1 table by mouth twice daily as needed for IBS/diarrhea. 04/24/18   Esterwood, Amy S, PA-C  ipratropium  (ATROVENT) 0.03 % nasal spray Place 2 sprays into both nostrils every 12 (twelve) hours. 02/18/18   Juanito Doom, MD  levothyroxine (SYNTHROID, LEVOTHROID) 50 MCG tablet Take 50 mcg by mouth daily before breakfast.    [provider]  LORazepam (ATIVAN) 0.5 MG tablet Take 0.5 mg by mouth 2 (two) times daily as needed (tremors).    [provider]  losartan (COZAAR) 100 MG tablet Take 1 tablet (100 mg total) by mouth daily. 11/04/18 02/02/19  Theora Gianotti, NP  Multiple Vitamins-Minerals (MULTIVITAMIN WITH MINERALS) tablet Take 1 tablet by mouth daily.  [provider]  pantoprazole (PROTONIX) 40 MG tablet Take 1 tablet (40 mg total) by mouth daily before breakfast. 06/20/18   Gatha Mayer, MD  Polyethyl Glycol-Propyl Glycol (SYSTANE OP) Apply to eye daily.    [provider]  pravastatin (PRAVACHOL) 40 MG tablet Take 1 tablet (40 mg total) by mouth at bedtime. 03/30/11   Parrett, Fonnie Mu, NP  sertraline (ZOLOFT) 100 MG tablet Take 1 tablet (100 mg total) by mouth at bedtime. 2 at bedtime 03/30/11   Parrett, Fonnie Mu, NP  sodium chloride (OCEAN NASAL SPRAY) 0.65 % nasal spray Place 2 sprays into both nostrils at bedtime. Use as needed 03/30/11   Parrett, Fonnie Mu, NP  sucralfate (CARAFATE) 1 GM/10ML suspension Take 1 gram between meals and at bedtime. Patient not taking: Reported on 11/06/2018 03/31/18   Esterwood, Amy S, PA-C  traMADol (ULTRAM) 50 MG tablet Take 50 mg by mouth 2 (two) times daily as needed (pain).     [provider]  traZODone (DESYREL) 50 MG tablet Take 50 mg by mouth at bedtime.      [provider]  triamcinolone (NASACORT ALLERGY 24HR) 55 MCG/ACT AERO nasal inhaler Place 1 spray into the nose daily.    [provider]    Family History Family History  Problem Relation Age of Onset  . Diabetes Father   . Throat cancer Mother   . Coronary artery disease Mother   . Prostate cancer Brother   . Asthma  Daughter   . Scoliosis Daughter   . Colon cancer Paternal Grandfather   . Esophageal cancer Neg Hx   . Rectal cancer Neg Hx   . Stomach cancer Neg Hx     Social History Social History   Tobacco Use  . Smoking status: Never Smoker  . Smokeless tobacco: Never Used  Substance Use Topics  . Alcohol use: Yes    Comment: rare  . Drug use: No     Allergies   Prednisone; Adhesive [tape]; Penicillins; and Sulfonamide derivatives   Review of Systems Review of Systems  Constitutional: Positive for fatigue and fever.  HENT: Positive for congestion.   Respiratory: Positive for cough.      Physical Exam Triage Vital Signs ED Triage Vitals  Enc Vitals Group     BP 11/30/18 1040 110/64     Pulse Rate 11/30/18 1040 91     Resp 11/30/18 1040 20     Temp 11/30/18 1040 (!) 101.3 F (38.5 C)     Temp Source 11/30/18 1040 Oral     SpO2 11/30/18 1040 92 %     Weight --      Height --      Head Circumference --      Peak Flow --      Pain Score 11/30/18 1041 7     Pain Loc --      Pain Edu? --      Excl. in [place]? --    No data found.  Updated Vital Signs BP 110/64 (BP Location: Left Arm)   Pulse 91   Temp (!) 101.3 F (38.5 C) (Oral)   Resp 20   SpO2 92%    Physical Exam Vitals signs and nursing note reviewed.  Constitutional:      General: She is in acute distress.     Appearance: Normal appearance. She is normal weight. She is ill-appearing. She is not toxic-appearing.  HENT:     Head: Normocephalic.  Comments: Right TM and mastoid have been surgically altered    Left Ear: Tympanic membrane normal.     Nose: Congestion present.     Mouth/Throat:     Mouth: Mucous membranes are moist.  Eyes:     Conjunctiva/sclera: Conjunctivae normal.     Pupils: Pupils are equal, round, and reactive to light.  Neck:     Musculoskeletal: Normal range of motion and neck supple.  Cardiovascular:     Rate and Rhythm: Normal rate.     Heart sounds: Normal heart sounds.    Pulmonary:     Effort: Pulmonary effort is normal.     Breath sounds: Wheezing present.  Musculoskeletal: Normal range of motion.  Skin:    General: Skin is warm and dry.  Neurological:     General: No focal deficit present.     Mental Status: She is alert.  Psychiatric:        Mood and Affect: Mood normal.        Behavior: Behavior normal.        Thought Content: Thought content normal.    Decreased wheezing after nebulizer treatment  UC Treatments / Results  Labs (all labs ordered are listed, but only abnormal results are displayed) Labs Reviewed - No data to display  EKG None  Radiology No results found.  Procedures Procedures (including critical care time)  Medications Ordered in UC Medications  acetaminophen (TYLENOL) tablet 650 mg (650 mg Oral Given 11/30/18 1051)  albuterol (PROVENTIL) (2.5 MG/3ML) 0.083% nebulizer solution 2.5 mg (2.5 mg Nebulization Given 11/30/18 1105)    Initial Impression / Assessment and Plan / UC Course  I have reviewed the triage vital signs and the nursing notes.  Pertinent labs & imaging results that were available during my care of the patient were reviewed by me and considered in my medical decision making (see chart for details).    Final Clinical Impressions(s) / UC Diagnoses   Final diagnoses:  Influenza     Discharge Instructions     You will need to continue to take tylenol to keep fever down.  If your shortness of breath worsens, you will need to be rechecked.  I recommend that you have another medical check in 48 hours or less to make sure there are no signs of pneumonia developing    ED Prescriptions    Medication Sig Dispense Auth. Provider   benzonatate (TESSALON) 100 MG capsule Take 1-2 capsules (100-200 mg total) by mouth 3 (three) times daily as needed for cough. 40 capsule Robyn Haber, MD   albuterol (PROVENTIL) (2.5 MG/3ML) 0.083% nebulizer solution Take 3 mLs (2.5 mg total) by nebulization every 6  (six) hours as needed for wheezing or shortness of breath. 75 mL Robyn Haber, MD     Controlled Substance Prescriptions [place] Controlled Substance Registry consulted? Not Applicable   Robyn Haber, MD 11/30/18 1131    Robyn Haber, MD 12/18/18 1450

## 2018-11-30 NOTE — ED Triage Notes (Signed)
Pt presents with ongoing fever, chills, generalized body aches, coughing, wheezing, chest congestion, weakness, and fatigue.

## 2018-11-30 NOTE — Discharge Instructions (Signed)
You will need to continue to take tylenol to keep fever down.  If your shortness of breath worsens, you will need to be rechecked.  I recommend that you have another medical check in 48 hours or less to make sure there are no signs of pneumonia developing

## 2018-11-30 NOTE — ED Notes (Signed)
Airflow nebulizer provided with instruction.  Pt and family member verbalized understanding.

## 2018-12-01 ENCOUNTER — Telehealth: Payer: Self-pay | Admitting: Cardiovascular Disease

## 2018-12-01 NOTE — Telephone Encounter (Signed)
Pt c/o medication issue:  1. Name of Medication: hydralazine   2. How are you currently taking this medication (dosage and times per day)?  50 mg po q 6 hr prn  3. Are you having a reaction (difficulty breathing--STAT)? no  4. What is your medication issue? Patient concerned office cancelled visit last week bc myoview not done but she needs to know about continuing this medication if needed only given temp rx from ED

## 2018-12-01 NOTE — Telephone Encounter (Signed)
Spoke to patient regarding PRN Hydralizine that was prescribed to her while she was in the ED for HTN on 11/06/18.  She reported that she took 1-2 a night until they were gone.   She saw Sharolyn Douglas last in clinic on 12/3 and was ordered to have a myoview. Pt reports that she doesn't want to have it at this time and is frustrated because "we have cancelled 2 appts with her because she has not had this test yet".    Pt takes her BP daily.  While taking the hydralazine her BP reading on the 6th were; 142/86, 188/93. Her BP on the 7th were 172/89.  Her BP over the last 3 days ( not on hydralazine); 12/28 135/68 12/29 117/66 12/30 158/86.  I believe that given the patient had recent medication changes and she took an entire PRN bottle of hydralazine (30 tablets) in under 3 weeks, I think it would be helpful for her to see a provider face to face.   Sent to provider for advice.   Advised pt to call for any further questions or concerns.

## 2018-12-02 DIAGNOSIS — J060 Acute laryngopharyngitis: Secondary | ICD-10-CM | POA: Diagnosis not present

## 2018-12-03 NOTE — Telephone Encounter (Signed)
Long history of anxiety causing labile hypertension and labile heart rate Would refill her hydralazine If she feels comfortable she can continue this as previously prescribed and she can take on a regular basis, every evening Blood pressures up and down is not new for her Would continue to monitor

## 2018-12-04 DIAGNOSIS — J112 Influenza due to unidentified influenza virus with gastrointestinal manifestations: Secondary | ICD-10-CM | POA: Diagnosis not present

## 2018-12-04 MED ORDER — HYDRALAZINE HCL 25 MG PO TABS: 50.0000 mg | ORAL_TABLET | Freq: Three times a day (TID) | ORAL | 3 refills | Status: DC | PRN

## 2018-12-04 NOTE — Telephone Encounter (Signed)
Call to pt to give information forwarded from Dr. Rockey Situ.  Pt verbalizes understanding, she asked about parameters of when to take Hydralazine which has not been previously addressed.   Dr. Rockey Situ not currently in clinic. Provider Christell Faith, PA suggested that Hydralazine 50 mg PRN TID for SBP > 150.  Information relayed to pt. She verbalized understanding.  Script sent electronically to pharm.   Advised pt to call for any further questions or concerns

## 2018-12-05 DIAGNOSIS — J060 Acute laryngopharyngitis: Secondary | ICD-10-CM | POA: Diagnosis not present

## 2018-12-08 DIAGNOSIS — J060 Acute laryngopharyngitis: Secondary | ICD-10-CM | POA: Diagnosis not present

## 2018-12-10 ENCOUNTER — Ambulatory Visit (HOSPITAL_COMMUNITY)
Admission: RE | Admit: 2018-12-10 | Discharge: 2018-12-10 | Disposition: A | Discharge status: Home / Self Care | Payer: PPO | Source: Ambulatory Visit | Attending: Adult Health Nurse Practitioner | Admitting: Adult Health Nurse Practitioner

## 2018-12-10 ENCOUNTER — Other Ambulatory Visit (HOSPITAL_COMMUNITY): Payer: Self-pay | Admitting: Adult Health Nurse Practitioner

## 2018-12-10 DIAGNOSIS — R059 Cough, unspecified: Secondary | ICD-10-CM

## 2018-12-10 DIAGNOSIS — R05 Cough: Secondary | ICD-10-CM | POA: Diagnosis not present

## 2018-12-10 DIAGNOSIS — R0602 Shortness of breath: Secondary | ICD-10-CM | POA: Diagnosis not present

## 2018-12-12 DIAGNOSIS — E039 Hypothyroidism, unspecified: Secondary | ICD-10-CM | POA: Diagnosis not present

## 2018-12-12 DIAGNOSIS — I10 Essential (primary) hypertension: Secondary | ICD-10-CM | POA: Diagnosis not present

## 2018-12-12 DIAGNOSIS — K219 Gastro-esophageal reflux disease without esophagitis: Secondary | ICD-10-CM | POA: Diagnosis not present

## 2018-12-12 DIAGNOSIS — E782 Mixed hyperlipidemia: Secondary | ICD-10-CM | POA: Diagnosis not present

## 2019-01-04 DIAGNOSIS — J112 Influenza due to unidentified influenza virus with gastrointestinal manifestations: Secondary | ICD-10-CM | POA: Diagnosis not present

## 2019-01-12 DIAGNOSIS — E782 Mixed hyperlipidemia: Secondary | ICD-10-CM | POA: Diagnosis not present

## 2019-01-12 DIAGNOSIS — K219 Gastro-esophageal reflux disease without esophagitis: Secondary | ICD-10-CM | POA: Diagnosis not present

## 2019-01-12 DIAGNOSIS — I10 Essential (primary) hypertension: Secondary | ICD-10-CM | POA: Diagnosis not present

## 2019-01-12 DIAGNOSIS — E039 Hypothyroidism, unspecified: Secondary | ICD-10-CM | POA: Diagnosis not present

## 2019-01-21 ENCOUNTER — Ambulatory Visit: Payer: Self-pay | Admitting: Cardiovascular Disease

## 2019-02-02 DIAGNOSIS — J112 Influenza due to unidentified influenza virus with gastrointestinal manifestations: Secondary | ICD-10-CM | POA: Diagnosis not present

## 2019-02-05 DIAGNOSIS — E039 Hypothyroidism, unspecified: Secondary | ICD-10-CM | POA: Diagnosis not present

## 2019-02-05 DIAGNOSIS — K219 Gastro-esophageal reflux disease without esophagitis: Secondary | ICD-10-CM | POA: Diagnosis not present

## 2019-02-05 DIAGNOSIS — E782 Mixed hyperlipidemia: Secondary | ICD-10-CM | POA: Diagnosis not present

## 2019-02-05 DIAGNOSIS — I10 Essential (primary) hypertension: Secondary | ICD-10-CM | POA: Diagnosis not present

## 2019-02-06 DIAGNOSIS — H401131 Primary open-angle glaucoma, bilateral, mild stage: Secondary | ICD-10-CM | POA: Diagnosis not present

## 2019-02-17 DIAGNOSIS — R7301 Impaired fasting glucose: Secondary | ICD-10-CM | POA: Diagnosis not present

## 2019-02-17 DIAGNOSIS — I10 Essential (primary) hypertension: Secondary | ICD-10-CM | POA: Diagnosis not present

## 2019-02-17 DIAGNOSIS — E782 Mixed hyperlipidemia: Secondary | ICD-10-CM | POA: Diagnosis not present

## 2019-02-17 DIAGNOSIS — E039 Hypothyroidism, unspecified: Secondary | ICD-10-CM | POA: Diagnosis not present

## 2019-02-23 DIAGNOSIS — K219 Gastro-esophageal reflux disease without esophagitis: Secondary | ICD-10-CM | POA: Diagnosis not present

## 2019-02-23 DIAGNOSIS — J310 Chronic rhinitis: Secondary | ICD-10-CM | POA: Diagnosis not present

## 2019-02-23 DIAGNOSIS — J479 Bronchiectasis, uncomplicated: Secondary | ICD-10-CM | POA: Diagnosis not present

## 2019-02-23 DIAGNOSIS — K2970 Gastritis, unspecified, without bleeding: Secondary | ICD-10-CM | POA: Diagnosis not present

## 2019-02-23 DIAGNOSIS — R7301 Impaired fasting glucose: Secondary | ICD-10-CM | POA: Diagnosis not present

## 2019-02-23 DIAGNOSIS — H4010X0 Unspecified open-angle glaucoma, stage unspecified: Secondary | ICD-10-CM | POA: Diagnosis not present

## 2019-02-23 DIAGNOSIS — M1380 Other specified arthritis, unspecified site: Secondary | ICD-10-CM | POA: Diagnosis not present

## 2019-02-23 DIAGNOSIS — E039 Hypothyroidism, unspecified: Secondary | ICD-10-CM | POA: Diagnosis not present

## 2019-02-23 DIAGNOSIS — R000 Tachycardia, unspecified: Secondary | ICD-10-CM | POA: Diagnosis not present

## 2019-02-23 DIAGNOSIS — M542 Cervicalgia: Secondary | ICD-10-CM | POA: Diagnosis not present

## 2019-02-23 DIAGNOSIS — E782 Mixed hyperlipidemia: Secondary | ICD-10-CM | POA: Diagnosis not present

## 2019-02-23 DIAGNOSIS — I10 Essential (primary) hypertension: Secondary | ICD-10-CM | POA: Diagnosis not present

## 2019-03-05 DIAGNOSIS — J112 Influenza due to unidentified influenza virus with gastrointestinal manifestations: Secondary | ICD-10-CM | POA: Diagnosis not present

## 2019-03-24 DIAGNOSIS — H7011 Chronic mastoiditis, right ear: Secondary | ICD-10-CM | POA: Diagnosis not present

## 2019-03-24 DIAGNOSIS — H903 Sensorineural hearing loss, bilateral: Secondary | ICD-10-CM | POA: Diagnosis not present

## 2019-04-04 DIAGNOSIS — J112 Influenza due to unidentified influenza virus with gastrointestinal manifestations: Secondary | ICD-10-CM | POA: Diagnosis not present

## 2019-04-06 DIAGNOSIS — I10 Essential (primary) hypertension: Secondary | ICD-10-CM | POA: Diagnosis not present

## 2019-04-06 DIAGNOSIS — E039 Hypothyroidism, unspecified: Secondary | ICD-10-CM | POA: Diagnosis not present

## 2019-04-06 DIAGNOSIS — E782 Mixed hyperlipidemia: Secondary | ICD-10-CM | POA: Diagnosis not present

## 2019-04-06 DIAGNOSIS — M542 Cervicalgia: Secondary | ICD-10-CM | POA: Diagnosis not present

## 2019-04-08 DIAGNOSIS — Z0000 Encounter for general adult medical examination without abnormal findings: Secondary | ICD-10-CM | POA: Diagnosis not present

## 2019-05-05 DIAGNOSIS — J112 Influenza due to unidentified influenza virus with gastrointestinal manifestations: Secondary | ICD-10-CM | POA: Diagnosis not present

## 2019-05-18 DIAGNOSIS — Z08 Encounter for follow-up examination after completed treatment for malignant neoplasm: Secondary | ICD-10-CM | POA: Diagnosis not present

## 2019-05-18 DIAGNOSIS — Z1283 Encounter for screening for malignant neoplasm of skin: Secondary | ICD-10-CM | POA: Diagnosis not present

## 2019-05-18 DIAGNOSIS — D225 Melanocytic nevi of trunk: Secondary | ICD-10-CM | POA: Diagnosis not present

## 2019-05-18 DIAGNOSIS — Z85828 Personal history of other malignant neoplasm of skin: Secondary | ICD-10-CM | POA: Diagnosis not present

## 2019-06-04 DIAGNOSIS — J112 Influenza due to unidentified influenza virus with gastrointestinal manifestations: Secondary | ICD-10-CM | POA: Diagnosis not present

## 2019-06-23 ENCOUNTER — Telehealth: Payer: Self-pay | Admitting: Nurse Practitioner

## 2019-06-23 NOTE — Telephone Encounter (Signed)

## 2019-06-24 ENCOUNTER — Ambulatory Visit: Payer: PPO | Admitting: Cardiovascular Disease

## 2019-06-24 ENCOUNTER — Encounter: Payer: Self-pay | Admitting: Nurse Practitioner

## 2019-06-24 ENCOUNTER — Ambulatory Visit: Payer: PPO | Admitting: Nurse Practitioner

## 2019-06-24 ENCOUNTER — Other Ambulatory Visit: Payer: Self-pay

## 2019-06-24 VITALS — BP 118/62 | HR 75 | Ht 68.0 in | Wt 155.4 lb

## 2019-06-24 DIAGNOSIS — E782 Mixed hyperlipidemia: Secondary | ICD-10-CM

## 2019-06-24 DIAGNOSIS — R0600 Dyspnea, unspecified: Secondary | ICD-10-CM | POA: Diagnosis not present

## 2019-06-24 DIAGNOSIS — R0989 Other specified symptoms and signs involving the circulatory and respiratory systems: Secondary | ICD-10-CM | POA: Diagnosis not present

## 2019-06-24 MED ORDER — HYDRALAZINE HCL 25 MG PO TABS: 50.0000 mg | ORAL_TABLET | Freq: Three times a day (TID) | ORAL | 3 refills | Status: DC | PRN

## 2019-06-24 NOTE — Progress Notes (Signed)
Office Visit    Patient Name: Christy Richardson Date of Encounter: 06/24/2019  Primary Care Provider:  Celene Squibb, MD Primary Cardiologist:  Ida Rogue, MD  Chief Complaint    84 y/o ? with a h/o labile HTN, anxiety, bronchiectasis, chronic dyspnea, GERD, goiter, HL, and diast dysfxn, who presents for f/u related to HTN.  Past Medical History    Past Medical History:  Diagnosis Date   Adenomatous polyp of colon 03/2007   Allergic rhinitis    Allergy    has a lot congestion   Anxiety    Blood transfusion 1974   Bronchiectasis ([place])    Cancer ([place])    left arm   Depression    Diabetes mellitus without complication ([place])    no meds   Diastolic dysfunction    a. 01/2014 Echo: EF 60-65%, no rwma, Gr1 DD, Ao sclerosis w/o stenosis. Mild TR.   Diverticulosis of colon (without mention of hemorrhage)    DOE (dyspnea on exertion)    Fatigue    Fecal incontinence    GERD (gastroesophageal reflux disease)    Glaucoma    Goiter    right side   Heart murmur    History of stress test    a. 01/2014 Lexi MV: no ischemia/infarct.   Hyperlipidemia    Hypertension    a. 11/2018 Renal artery duplex: No RAS.    Hypothyroidism    IBS (irritable bowel syndrome)    Migraine    Mitral valve prolapse    a. 01/2014 Echo: no MR/MVP noted.   Neck mass    right side/ no airway problems/ per pt has had since 2005 (goiter)   Nonspecific abnormal electrocardiogram (ECG) (EKG)    Palpitations    Palpitations    a. 01/2014 CardioNet: wore for 9 days (rash). Sinus tach - 100's. No SVT/AF.   Polyp, sigmoid colon    Post-operative nausea and vomiting    Pulmonary nodules    Recurrent UTI    Tremor    Past Surgical History:  Procedure Laterality Date   ABDOMINAL HYSTERECTOMY     CARDIOVASCULAR STRESS TEST  09/19/2011   No scintigraphic evidence of inducible myocardial ischemia. Pharmacological stress test without chest pain or EKG changes for ischemia.     COLONOSCOPY     ESOPHAGOGASTRODUODENOSCOPY     MASTOIDECTOMY     rt ear/ as a teenager   OOPHORECTOMY     SKIN CANCER EXCISION Left    arm   TOTAL HIP ARTHROPLASTY  5/10   left   TRANSTHORACIC ECHOCARDIOGRAM  09/19/2011   EF >26%, stage 1 diastolic dysfunction, mild tricuspid valve regurg    Allergies  Allergies  Allergen Reactions   Prednisone     REACTION: Increase eye pressure with gloucoma. Broke out in rash after injection per Dr. Cherlynn Perches [Tape]     Causes blisters/irritates skin   Penicillins     REACTION: Rash   Sulfonamide Derivatives     REACTION: Rash - not sure    History of Present Illness    84 y/o ? w/ a h/o labile HTN, anxiety, bronchiectasis, chronic dyspnea, GERD, goiter, HL, and diastolic dysfxn.  She prev underwent eval for dyspnea w/ stress testing (non-ischemic) and echo (nl LVEF, grade 1 diast dysfxn) in 01/2014.  In the setting of bronchiectasis and chronic dyspnea, she has been followed closely by pulmonology.    She was last seen in clinic 11/2018 after recent blood pressure  spikes.   blocker was changed from toprol xl to carvedilol and losartan 100mg  daily was continued.  Hydralazine was added ~ 1 wk later after she was seen in the ED w/ hypertension and systolic >938.  She has been using hydralazine on an as-needed basis only and notes that she only used a few doses after her ER visit in December and has not used any of it in quite some time.  She remains on carvedilol 12.5 mg twice daily as well as losartan 100 mg daily and she has a list of blood pressures today showing that she typically ranges between 130 and 140 with occasional rises into the 150s or 160s.  Overall however, she has done quite well.  She is chronic dyspnea on exertion and notes that in the past month or so, this may have worsened some.  She does not experience chest pain and denies palpitations, PND, orthopnea, dizziness, syncope, edema, or early satiety.  Home  Medications    Prior to Admission medications   Medication Sig Start Date End Date Taking? Authorizing Provider  acetaminophen (TYLENOL) 500 MG tablet Take 500 mg by mouth as needed.    [provider]  albuterol (PROVENTIL) (2.5 MG/3ML) 0.083% nebulizer solution Take 3 mLs (2.5 mg total) by nebulization every 6 (six) hours as needed for wheezing or shortness of breath. 11/30/18   Robyn Haber, MD  Alpha-D-Galactosidase [facility]) TABS Take 1 tablet by mouth as needed (for gas).  03/30/11   Parrett, Fonnie Mu, NP  ALPRAZolam (XANAX) 1 MG tablet Take 1 tablet (1 mg total) by mouth at bedtime as needed. 03/30/11   Parrett, Fonnie Mu, NP  benzonatate (TESSALON) 100 MG capsule Take 1-2 capsules (100-200 mg total) by mouth 3 (three) times daily as needed for cough. 11/30/18   Robyn Haber, MD  brimonidine (ALPHAGAN) 0.2 % ophthalmic solution Place 1 drop into both eyes 2 (two) times daily. Place 1 drop into both eyes 2 (two) times daily. 12/21/15   [provider]  carvedilol (COREG) 12.5 MG tablet Take 1 tablet (12.5 mg total) by mouth 2 (two) times daily. 11/04/18 02/02/19  Theora Gianotti, NP  cetirizine (ZYRTEC) 10 MG tablet Take 10 mg by mouth daily.  03/30/11   Parrett, Fonnie Mu, NP  cyclobenzaprine (FLEXERIL) 10 MG tablet Take 10 mg by mouth 3 (three) times daily as needed for muscle spasms.    [provider]  dorzolamide (TRUSOPT) 2 % ophthalmic solution Place 1 drop into both eyes 2 (two) times daily. Place 1 drop into both eyes 2 (two) times daily. 11/03/15   [provider]  fluticasone-salmeterol (ADVAIR HFA) 115-21 MCG/ACT inhaler Inhale 2 puffs into the lungs 2 (two) times daily. 02/18/18   Juanito Doom, MD  hydrALAZINE (APRESOLINE) 25 MG tablet Take 2 tablets (50 mg total) by mouth 3 (three) times daily as needed (Take as needed for systolic blood pressure > 150.). 12/04/18   Dunn, Areta Haber, PA-C  Hyoscyamine Sulfate SL (LEVSIN/SL) 0.125 MG SUBL Take  1 table by mouth twice daily as needed for IBS/diarrhea. 04/24/18   Esterwood, Amy S, PA-C  ipratropium (ATROVENT) 0.03 % nasal spray Place 2 sprays into both nostrils every 12 (twelve) hours. 02/18/18   Juanito Doom, MD  levothyroxine (SYNTHROID, LEVOTHROID) 50 MCG tablet Take 50 mcg by mouth daily before breakfast.    [provider]  LORazepam (ATIVAN) 0.5 MG tablet Take 0.5 mg by mouth 2 (two) times daily as needed (tremors).  [provider]  losartan (COZAAR) 100 MG tablet Take 1 tablet (100 mg total) by mouth daily. 11/04/18 02/02/19  Theora Gianotti, NP  Multiple Vitamins-Minerals (MULTIVITAMIN WITH MINERALS) tablet Take 1 tablet by mouth daily.      [provider]  pantoprazole (PROTONIX) 40 MG tablet Take 1 tablet (40 mg total) by mouth daily before breakfast. 06/20/18   Gatha Mayer, MD  Polyethyl Glycol-Propyl Glycol (SYSTANE OP) Apply to eye daily.    [provider]  pravastatin (PRAVACHOL) 40 MG tablet Take 1 tablet (40 mg total) by mouth at bedtime. 03/30/11   Parrett, Fonnie Mu, NP  sertraline (ZOLOFT) 100 MG tablet Take 1 tablet (100 mg total) by mouth at bedtime. 2 at bedtime 03/30/11   Parrett, Fonnie Mu, NP  sodium chloride (OCEAN NASAL SPRAY) 0.65 % nasal spray Place 2 sprays into both nostrils at bedtime. Use as needed 03/30/11   Parrett, Fonnie Mu, NP  sucralfate (CARAFATE) 1 GM/10ML suspension Take 1 gram between meals and at bedtime. Patient not taking: Reported on 11/06/2018 03/31/18   Esterwood, Amy S, PA-C  traMADol (ULTRAM) 50 MG tablet Take 50 mg by mouth 2 (two) times daily as needed (pain).     [provider]  traZODone (DESYREL) 50 MG tablet Take 50 mg by mouth at bedtime.      [provider]  triamcinolone (NASACORT ALLERGY 24HR) 55 MCG/ACT AERO nasal inhaler Place 1 spray into the nose daily.    [provider]    Review of Systems    Relatively stable blood pressures.  She has dyspnea on  exertion, which is chronic and has worsened slightly in the past month.  She denies chest pain, palpitations, PND, orthopnea, dizziness, syncope, edema, or early satiety.  All other systems reviewed and are otherwise negative except as noted above.  Physical Exam    VS:  BP 118/62 (BP Location: Left Arm, Patient Position: Sitting, Cuff Size: Normal)    Pulse 75    Ht 5\' 8"  (1.727 m)    Wt 155 lb 6.4 oz (70.5 kg)    SpO2 97%    BMI 23.63 kg/m  , BMI Body mass index is 23.63 kg/m. GEN: Well nourished, well developed, in no acute distress. HEENT: normal. Neck: Supple, no JVD, carotid bruits, or masses. Cardiac: RRR, no murmurs, rubs, or gallops. No clubbing, cyanosis, edema.  Radials/PT 2+ and equal bilaterally.  Respiratory:  Respirations regular and unlabored, clear to auscultation bilaterally. GI: Soft, nontender, nondistended, BS + x 4. MS: no deformity or atrophy. Skin: warm and dry, no rash. Neuro:  Strength and sensation are intact. Psych: Normal affect.  Accessory Clinical Findings    ECG personally reviewed by me today -regular sinus rhythm, 76, septal infarct- no acute changes.  Lab Results  Component Value Date   WBC 5.3 11/06/2018   HGB 14.5 11/06/2018   HCT 43.8 11/06/2018   MCV 89.9 11/06/2018   PLT 226 11/06/2018   Lab Results  Component Value Date   CREATININE 0.77 11/06/2018   BUN 12 11/06/2018   NA 139 11/06/2018   K 3.9 11/06/2018   CL 104 11/06/2018   CO2 28 11/06/2018   Lab Results  Component Value Date   ALT 17 03/20/2018   AST 24 03/20/2018   ALKPHOS 55 03/20/2018   BILITOT 1.3 (H) 03/20/2018    Assessment & Plan    1.  Labile hypertension: Overall, this is been much better controlled after switching from  metoprolol to carvedilol back in December.  There was a brief period of time where she required PRN hydralazine but notes that she has not used this in quite some time.  She does still have hydralazine at home and I recommended that she only use  this for systolics greater than 122, which based on her recordings which she presented for review today, does not happen very often.  Historically, anxiety has played a significant role in her blood pressure spikes.  Continue current doses of carvedilol and losartan.  2.  Chronic dyspnea on exertion: She notes that in the past month, this has worsened slightly and she notes more difficulty walking to and from her mailbox.  Is been 5 years since her last echocardiogram and I will arrange for a follow-up.  3.  History of chest pain: She has not had any chest pain recently.  Previous negative stress test in 2015.  4.  Hyperlipidemia: On statin and followed by primary care.  5.  Bronchiectasis: Contributing to chronic dyspnea.  Followed by pulmonology.  6.  Disposition: Follow-up echocardiogram and follow-up with Dr. Rockey Situ in 6 months or sooner if necessary.   Murray Hodgkins, NP 06/24/2019, 12:35 PM

## 2019-06-24 NOTE — Patient Instructions (Signed)
Medication Instructions:  Your physician recommends that you continue on your current medications as directed. Please refer to the Current Medication list given to you today.  You may take your hydralazine as needed three times a day for systolic BP greater than 932.  If you need a refill on your cardiac medications before your next appointment, please call your pharmacy.   Lab work: - None ordered.  If you have labs (blood work) drawn today and your tests are completely normal, you will receive your results only by: Marland Kitchen MyChart Message (if you have MyChart) OR . A paper copy in the mail If you have any lab test that is abnormal or we need to change your treatment, we will call you to review the results.  Testing/Procedures: Your physician has requested that you have an echocardiogram. Echocardiography is a painless test that uses sound waves to create images of your heart. It provides your doctor with information about the size and shape of your heart and how well your heart's chambers and valves are working. This procedure takes approximately one hour. There are no restrictions for this procedure. You may get an IV, if needed, to receive an ultrasound enhancing agent through to better visualize your heart.     Follow-Up: At [facility], you and your health needs are our priority.  As part of our continuing mission to provide you with exceptional heart care, we have created designated Provider Care Teams.  These Care Teams include your primary Cardiologist (physician) and Advanced Practice Providers (APPs -  Physician Assistants and Nurse Practitioners) who all work together to provide you with the care you need, when you need it. You will need a follow up appointment in 6 months.  Please call our office 2 months in advance to schedule this appointment.  You may see Ida Rogue, MD or one of the following Advanced Practice Providers on your designated Care Team:   Murray Hodgkins, NP  Christell Faith, PA-C . Marrianne Mood, PA-C     Echocardiogram An echocardiogram is a procedure that uses painless sound waves (ultrasound) to produce an image of the heart. Images from an echocardiogram can provide important information about:  Signs of coronary artery disease (CAD).  Aneurysm detection. An aneurysm is a weak or damaged part of an artery wall that bulges out from the normal force of blood pumping through the body.  Heart size and shape. Changes in the size or shape of the heart can be associated with certain conditions, including heart failure, aneurysm, and CAD.  Heart muscle function.  Heart valve function.  Signs of a past heart attack.  Fluid buildup around the heart.  Thickening of the heart muscle.  A tumor or infectious growth around the heart valves. Tell a health care provider about:  Any allergies you have.  All medicines you are taking, including vitamins, herbs, eye drops, creams, and over-the-counter medicines.  Any blood disorders you have.  Any surgeries you have had.  Any medical conditions you have.  Whether you are pregnant or may be pregnant. What are the risks? Generally, this is a safe procedure. However, problems may occur, including:  Allergic reaction to dye (contrast) that may be used during the procedure. What happens before the procedure? No specific preparation is needed. You may eat and drink normally. What happens during the procedure?   An IV tube may be inserted into one of your veins.  You may receive contrast through this tube. A contrast is an injection  that improves the quality of the pictures from your heart.  A gel will be applied to your chest.  A wand-like tool (transducer) will be moved over your chest. The gel will help to transmit the sound waves from the transducer.  The sound waves will harmlessly bounce off of your heart to allow the heart images to be captured in real-time motion. The images will be  recorded on a computer. The procedure may vary among health care providers and hospitals. What happens after the procedure?  You may return to your normal, everyday life, including diet, activities, and medicines, unless your health care provider tells you not to do that. Summary  An echocardiogram is a procedure that uses painless sound waves (ultrasound) to produce an image of the heart.  Images from an echocardiogram can provide important information about the size and shape of your heart, heart muscle function, heart valve function, and fluid buildup around your heart.  You do not need to do anything to prepare before this procedure. You may eat and drink normally.  After the echocardiogram is completed, you may return to your normal, everyday life, unless your health care provider tells you not to do that. This information is not intended to replace advice given to you by your health care provider. Make sure you discuss any questions you have with your health care provider. Document Released: 11/16/2000 Document Revised: 03/12/2019 Document Reviewed: 12/22/2016 Elsevier Patient Education  2020 [employer].

## 2019-07-03 ENCOUNTER — Other Ambulatory Visit: Payer: Self-pay | Admitting: Nurse Practitioner

## 2019-07-03 DIAGNOSIS — R0600 Dyspnea, unspecified: Secondary | ICD-10-CM

## 2019-07-05 DIAGNOSIS — J112 Influenza due to unidentified influenza virus with gastrointestinal manifestations: Secondary | ICD-10-CM | POA: Diagnosis not present

## 2019-07-16 ENCOUNTER — Other Ambulatory Visit: Payer: Self-pay

## 2019-07-16 ENCOUNTER — Ambulatory Visit (INDEPENDENT_AMBULATORY_CARE_PROVIDER_SITE_OTHER): Payer: PPO

## 2019-07-16 DIAGNOSIS — R0600 Dyspnea, unspecified: Secondary | ICD-10-CM | POA: Diagnosis not present

## 2019-07-20 ENCOUNTER — Telehealth: Payer: Self-pay | Admitting: Nurse Practitioner

## 2019-07-20 NOTE — Telephone Encounter (Signed)
Attempted to call the patient. No answer- I left a message to please call back.  

## 2019-07-20 NOTE — Telephone Encounter (Signed)
Patient made aware of echo results with verbalized understanding. 

## 2019-07-20 NOTE — Telephone Encounter (Signed)
Patient returning call.

## 2019-07-20 NOTE — Telephone Encounter (Signed)
Notes recorded by Theora Gianotti, NP on 07/20/2019 at 9:20 AM EDT  Nl heart squeezing fxn w/ mild stiffness to the main pumping chamber (left ventricle). In the case of stiffness, good blood pressure and heart rate control remain important. Mild leakiness of the mitral and tricuspid valves - which should not be contributing to shortness of breath at this time. Overall, reassuring.

## 2019-08-05 DIAGNOSIS — H401131 Primary open-angle glaucoma, bilateral, mild stage: Secondary | ICD-10-CM | POA: Diagnosis not present

## 2019-08-05 DIAGNOSIS — J112 Influenza due to unidentified influenza virus with gastrointestinal manifestations: Secondary | ICD-10-CM | POA: Diagnosis not present

## 2019-09-03 DIAGNOSIS — I10 Essential (primary) hypertension: Secondary | ICD-10-CM | POA: Diagnosis not present

## 2019-09-03 DIAGNOSIS — E039 Hypothyroidism, unspecified: Secondary | ICD-10-CM | POA: Diagnosis not present

## 2019-09-03 DIAGNOSIS — K219 Gastro-esophageal reflux disease without esophagitis: Secondary | ICD-10-CM | POA: Diagnosis not present

## 2019-09-03 DIAGNOSIS — J479 Bronchiectasis, uncomplicated: Secondary | ICD-10-CM | POA: Diagnosis not present

## 2019-09-03 DIAGNOSIS — H4010X0 Unspecified open-angle glaucoma, stage unspecified: Secondary | ICD-10-CM | POA: Diagnosis not present

## 2019-09-03 DIAGNOSIS — J310 Chronic rhinitis: Secondary | ICD-10-CM | POA: Diagnosis not present

## 2019-09-03 DIAGNOSIS — E782 Mixed hyperlipidemia: Secondary | ICD-10-CM | POA: Diagnosis not present

## 2019-09-03 DIAGNOSIS — R000 Tachycardia, unspecified: Secondary | ICD-10-CM | POA: Diagnosis not present

## 2019-09-03 DIAGNOSIS — R7301 Impaired fasting glucose: Secondary | ICD-10-CM | POA: Diagnosis not present

## 2019-09-03 DIAGNOSIS — K2970 Gastritis, unspecified, without bleeding: Secondary | ICD-10-CM | POA: Diagnosis not present

## 2019-09-04 DIAGNOSIS — R509 Fever, unspecified: Secondary | ICD-10-CM | POA: Diagnosis not present

## 2019-09-04 DIAGNOSIS — Z20828 Contact with and (suspected) exposure to other viral communicable diseases: Secondary | ICD-10-CM | POA: Diagnosis not present

## 2019-09-04 DIAGNOSIS — J112 Influenza due to unidentified influenza virus with gastrointestinal manifestations: Secondary | ICD-10-CM | POA: Diagnosis not present

## 2019-09-04 DIAGNOSIS — R05 Cough: Secondary | ICD-10-CM | POA: Diagnosis not present

## 2019-09-17 ENCOUNTER — Telehealth: Payer: Self-pay | Admitting: Pulmonary Disease

## 2019-09-17 NOTE — Telephone Encounter (Signed)
Called and spoke to patient. Patient stated that her daughter had been looking up our physicians and they are wondering who she should see in the future.  Patient also stated that she is having chest congestion and a productive cough with deep yellow phlegm. Patient scheduled for a televisit with Eric Form, NP for 09/20/2019.  Patient was not available for a telelvisit on 10/15. Nothing further needed at this time.

## 2019-09-21 ENCOUNTER — Ambulatory Visit (INDEPENDENT_AMBULATORY_CARE_PROVIDER_SITE_OTHER): Payer: PPO | Admitting: Acute Care

## 2019-09-21 ENCOUNTER — Encounter: Payer: Self-pay | Admitting: Acute Care

## 2019-09-21 DIAGNOSIS — J471 Bronchiectasis with (acute) exacerbation: Secondary | ICD-10-CM | POA: Diagnosis not present

## 2019-09-21 DIAGNOSIS — R5381 Other malaise: Secondary | ICD-10-CM | POA: Diagnosis not present

## 2019-09-21 NOTE — Progress Notes (Signed)
PCCM: Thanks Garner Nash, DO [facility] 09/21/2019 5:12 PM

## 2019-09-21 NOTE — Patient Instructions (Addendum)
It is good to speak with you today. I'm sorry you are not feeling well. We recommend the following: Doxycycline 100 mg twice daily x 7 days Take with a full glass of water. Try albuterol nebulizers  twice a day to help clear out mucus Increase  the flutter valve 4 to 5 breaths, 4-5 times a day Increase Mucinex 1200 mg once daily Take with full glass of water If Once daily Mucinex does not work, increase to twice daily Keep taking Advair 2 puffs twice a day no matter how you feel Follow up tele visit  11/9/20230 at 11 am with [name] NP  to make sure you are feeling better with the above interventions.   In the future we need to consider sputum culture We will set you up with Dr. Valeta Harms as primary pulmonary doctor in the practice as Dr. Lake Bells is transitioning out of the office. We will schedule an appointment in 2 months with Dr. Valeta Harms ( Consult appointment to establish Icard as primary) Scheduled 11/11/2019 at 1:30 pm  Dr. Valeta Harms Continue to exercise as much as possible ( a daily walk is a good start as able) Please contact office for sooner follow up if symptoms do not improve or worsen or seek emergency care

## 2019-09-21 NOTE — Progress Notes (Signed)
Virtual Visit via Video Note  I connected with Christy Richardson on 09/21/19 at 11:00 AM EDT by a video enabled telemedicine application and verified that I am speaking with the correct person using two identifiers.  Location: Patient: At home Provider: In the office at [facility], [place], [place],  [address]   I discussed the limitations of evaluation and management by telemedicine and the availability of in person appointments. The patient expressed understanding and agreed to proceed.  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  She switched care [facility] to [facility] (Pulmonary) in 05/2012. Her simple spirometry in 2013 was 78% predicted. She is followed by Dr. Lake Bells.  History of Present Illness: Pt. Was last seen in the office 04/2018 by Dr. Lake Bells. She states she has had worsening congestion since 06/2018, and she did not follow up. She follows up today stating that she has had increased chest congestion and and sputum is a darkish yellow with some tan. It is very thick. She denies any fever, or chest pain. She states secretions are not green, but the color is a change from her baseline. She states she does have worsening reflux, which she does take protonix for daily. She states she is not drinking caffeine. She is following a GERD diet. She is taking Mucinex 400 mg once daily, instead of the 1200 BID. She states she is not using her flutter valve every day. She does endorse lack of physical exercise.exercise     Observations/Objective: 11/2012 CT chest [facility] >> bilateral bronchiectasis, scattered nodules no greater than 5mm in size March 2015 simple spirometry > ratio 62%, FEV1 1.87 L (79% predicted) February 2015 chest x-ray images reviewed: Elevated right hemidiaphragm, some chronic bronchitic changes, otherwise clear March 2019 chest x-ray images independently reviewed, hyperinflation noted, small pulmonary nodule stable.   Assessment  and Plan: Bronchiectasis: Pt. Has not been aggressively using her mucus clearing measures Not using flutter valve daily Not using albuterol Taking only 400 mg mucinex daily Plan Doxycycline 100 mg twice daily x 7 days Take with a full glass of water. Try albuterol nebulizers  twice a day to help clear out mucus Use the flutter valve 4 to 5 breaths, 4-5 times a day Increase Mucinex 1200 mg once daily Take with full glass of water If Once daily Mucinex does not work, increase to twice daily Keep taking Advair 2 puffs twice a day no matter how you feel Follow up 11/9/20230 at 11 am with [name] NP  to make sure you are feeling better with the above interventions.   In the future we need to consider sputum culture for possible colonization ID  We will set you up with Dr. Valeta Harms as primary We will schedule an appointment in 2 month with Dr. Valeta Harms ( Consult appointment to establish Icard as primary) Scheduled 11/11/2019 at 1:30 pm  Dr. Valeta Harms  Will need flu vaccine at this follow up.  Physical deconditioning: Plan: Continue to exercise as much as possible. ( Walking daily is a good start)   Follow Up Instructions: Follow up 11/9/20230 at 11 am with [name] NP  to make sure you are feeling better with the above interventions.   In the future we need to consider sputum culture, and chest vest if the above actions do not help We will set you up with Dr. Valeta Harms as primary pulmonary doctor in the practice since Dr. Lake Bells is transitioning out of the office.  We will schedule an  appointment in 2 months with Dr. Valeta Harms ( Consult appointment to establish Icard as primary) Scheduled 11/11/2019 at 1:30 pm  Dr. Valeta Harms     I discussed the assessment and treatment plan with the patient. The patient was provided an opportunity to ask questions and all were answered. The patient agreed with the plan and demonstrated an understanding of the instructions.   The patient was advised to call back or seek an  in-person evaluation if the symptoms worsen or if the condition fails to improve as anticipated.  I provided 25  minutes of non-face-to-face time during this encounter.   Magdalen Spatz, NP 09/21/2019 11:25 AM

## 2019-09-22 ENCOUNTER — Telehealth: Payer: Self-pay | Admitting: Acute Care

## 2019-09-22 DIAGNOSIS — H903 Sensorineural hearing loss, bilateral: Secondary | ICD-10-CM | POA: Diagnosis not present

## 2019-09-22 DIAGNOSIS — H7011 Chronic mastoiditis, right ear: Secondary | ICD-10-CM | POA: Diagnosis not present

## 2019-09-22 MED ORDER — DOXYCYCLINE HYCLATE 100 MG PO TABS: 100.0000 mg | ORAL_TABLET | Freq: Two times a day (BID) | ORAL | 0 refills | Status: DC

## 2019-09-22 NOTE — Telephone Encounter (Signed)
ATC Patient.  LM for Patient to let her know prescription was sent to [facility].  Doxycycline 100mg  twice daily x's 7 days sent to requested [facility].   09/21/19 OV with [name], NP- I'm sorry you are not feeling well. We recommend the following: Doxycycline 100 mg twice daily x 7 days Take with a full glass of water. Try albuterol nebulizers  twice a day to help clear out mucus Increase  the flutter valve 4 to 5 breaths, 4-5 times a day Increase Mucinex 1200 mg once daily Take with full glass of water If Once daily Mucinex does not work, increase to twice daily Keep taking Advair 2 puffs twice a day no matter how you feel Follow up tele visit  11/9/20230 at 11 am with [name] NP  to make sure you are feeling better with the above interventions.   In the future we need to consider sputum culture We will set you up with Dr. Valeta Harms as primary pulmonary doctor in the practice as Dr. Lake Bells is transitioning out of the office. We will schedule an appointment in 2 months with Dr. Valeta Harms ( Consult appointment to establish Icard as primary) Scheduled 11/11/2019 at 1:30 pm  Dr. Valeta Harms Continue to exercise as much as possible ( a daily walk is a good start as able) Please contact office for sooner follow up if symptoms do not improve or worsen or seek emergency care

## 2019-09-23 ENCOUNTER — Encounter: Payer: Self-pay | Admitting: Cardiovascular Disease

## 2019-09-23 DIAGNOSIS — E782 Mixed hyperlipidemia: Secondary | ICD-10-CM | POA: Diagnosis not present

## 2019-09-23 DIAGNOSIS — R7301 Impaired fasting glucose: Secondary | ICD-10-CM | POA: Diagnosis not present

## 2019-09-23 DIAGNOSIS — E039 Hypothyroidism, unspecified: Secondary | ICD-10-CM | POA: Diagnosis not present

## 2019-09-23 DIAGNOSIS — I10 Essential (primary) hypertension: Secondary | ICD-10-CM | POA: Diagnosis not present

## 2019-09-24 DIAGNOSIS — R7301 Impaired fasting glucose: Secondary | ICD-10-CM | POA: Diagnosis not present

## 2019-09-24 DIAGNOSIS — R000 Tachycardia, unspecified: Secondary | ICD-10-CM | POA: Diagnosis not present

## 2019-09-24 DIAGNOSIS — K2970 Gastritis, unspecified, without bleeding: Secondary | ICD-10-CM | POA: Diagnosis not present

## 2019-09-24 DIAGNOSIS — J310 Chronic rhinitis: Secondary | ICD-10-CM | POA: Diagnosis not present

## 2019-09-24 DIAGNOSIS — M542 Cervicalgia: Secondary | ICD-10-CM | POA: Diagnosis not present

## 2019-09-24 DIAGNOSIS — H4010X0 Unspecified open-angle glaucoma, stage unspecified: Secondary | ICD-10-CM | POA: Diagnosis not present

## 2019-09-24 DIAGNOSIS — J479 Bronchiectasis, uncomplicated: Secondary | ICD-10-CM | POA: Diagnosis not present

## 2019-09-24 DIAGNOSIS — I10 Essential (primary) hypertension: Secondary | ICD-10-CM | POA: Diagnosis not present

## 2019-09-24 DIAGNOSIS — K219 Gastro-esophageal reflux disease without esophagitis: Secondary | ICD-10-CM | POA: Diagnosis not present

## 2019-09-24 DIAGNOSIS — E039 Hypothyroidism, unspecified: Secondary | ICD-10-CM | POA: Diagnosis not present

## 2019-09-24 DIAGNOSIS — N1831 Chronic kidney disease, stage 3a: Secondary | ICD-10-CM | POA: Diagnosis not present

## 2019-09-24 DIAGNOSIS — E782 Mixed hyperlipidemia: Secondary | ICD-10-CM | POA: Diagnosis not present

## 2019-10-01 ENCOUNTER — Telehealth: Payer: Self-pay | Admitting: Cardiovascular Disease

## 2019-10-01 NOTE — Telephone Encounter (Signed)
Patient is returning your call.  

## 2019-10-01 NOTE — Telephone Encounter (Signed)
Call attempted. NA/NV 

## 2019-10-01 NOTE — Telephone Encounter (Signed)
Pt c/o medication issue:  1. Name of Medication: Hydralazine 2. How are you currently taking this medication (dosage and times per day)?  Patient is not sure  3. Are you having a reaction (difficulty breathing--STAT)? no  4. What is your medication issue? BP too high 183/111 187/114

## 2019-10-01 NOTE — Telephone Encounter (Signed)
Pt reports labile HTN for several weeks, "this is nothing new".   Oct 10th 189/97, hr 68 11th 171/100, hr 82 12th 170/91, hr 87 13th 156/86, hr 90 14 th 178/96, hr 79 15th 183/100, hr 75 16th 145/90, hr 90  10/23 woke in the middle of the night with headache.  7:55 AM 187/111, hr 77 8:45 AM took 2 hydralazine, held coreg 9:45 148/66, hr 88 10:15 123/64, hr 80 3:10 pm 152/90, hr 86 4:45 pm 175/92, hr 77, took coreg  Pt reports that hydralazine makes her feel terrible. Like she does not want to move or do anything.   Pt denies dizziness, cxp or SOB.

## 2019-10-02 NOTE — Telephone Encounter (Signed)
Patient calling to check status of response

## 2019-10-02 NOTE — Telephone Encounter (Signed)
To Dr. Arley Phenix, NP to review.

## 2019-10-04 NOTE — Telephone Encounter (Signed)
Can we verify she is taking losartan 100 daily and carvedilol 12.5 mg twice daily on a regular basis Looks like she was missing a dose October 23 in the morning?  Blood pressures measured, is at before or after medications.  No time listed October 10-16 I have not seen her since March 2019.

## 2019-10-05 DIAGNOSIS — J112 Influenza due to unidentified influenza virus with gastrointestinal manifestations: Secondary | ICD-10-CM | POA: Diagnosis not present

## 2019-10-05 NOTE — Telephone Encounter (Signed)
Spoke with patient and she states that blood pressure readings are taken first thing in the morning after she gets up. This is before her morning medications or eating breakfast. Reviewed that it will be more elevated during that time and she should take her morning blood pressure pills and then wait 2 hours to check her blood pressure. Recommended that for this week she try to take her blood pressures 2 hours after medications and to call me on Friday with those readings. She reports that hydralazine does make her feel bad. Recommended that she decrease down to 1 tablet with elevated blood pressures to see if that will help any. She verbalized understanding of recommendations and will call back on Friday with her readings this week.

## 2019-10-05 NOTE — Telephone Encounter (Signed)
Patient calling to check on status  Please call to discuss  

## 2019-10-09 ENCOUNTER — Other Ambulatory Visit (HOSPITAL_COMMUNITY): Payer: Self-pay | Admitting: Internal Medicine

## 2019-10-09 DIAGNOSIS — Z1231 Encounter for screening mammogram for malignant neoplasm of breast: Secondary | ICD-10-CM

## 2019-10-09 NOTE — Telephone Encounter (Signed)
Alessia calling with BP readings  Nov 3rd at 8:00am 158/87 pulse 80 2 hours later 103/64 pulse 80  Nov 4th at 8:00am 165/87 pulse 78 2 hours later  151/74 pulse 66  Nov 5th at 8:10am 177/98 pulse 81 2 hours later 122/69 pulse 77  Nov 6th 8:50am 174/97 pulse 84 2 hours later  133/67 pulse 73

## 2019-10-10 NOTE — Telephone Encounter (Signed)
So it would appear  blood pressure after she takes her medications is well controlled if not 1 time was low Those of the numbers we care about Would consider moving the carvedilol little bit later in the evening as it may be wearing off first thing in the morning making it run high Could also move hydralazine evening dose, take before bed Would continue the losartan and carvedilol I am assuming these numbers are on a half dose hydralazine? Med list?

## 2019-10-12 ENCOUNTER — Ambulatory Visit (INDEPENDENT_AMBULATORY_CARE_PROVIDER_SITE_OTHER): Payer: PPO | Admitting: Acute Care

## 2019-10-12 DIAGNOSIS — R0600 Dyspnea, unspecified: Secondary | ICD-10-CM | POA: Diagnosis not present

## 2019-10-12 DIAGNOSIS — J471 Bronchiectasis with (acute) exacerbation: Secondary | ICD-10-CM | POA: Diagnosis not present

## 2019-10-12 DIAGNOSIS — J84112 Idiopathic pulmonary fibrosis: Secondary | ICD-10-CM

## 2019-10-12 NOTE — Progress Notes (Signed)
Virtual Visit via Video Note  I connected with Christy Richardson on 10/12/19 at 11:00 AM EST by a video enabled telemedicine application and verified that I am speaking with the correct person using two identifiers.  Location: Patient: At Home Provider: At the office at [address]   I discussed the limitations of evaluation and management by telemedicine and the availability of in person appointments. The patient expressed understanding and agreed to proceed.  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 She switched care from [facility] to [facility] (Pulmonary) in 05/2012. Her simple spirometry in 2013 was 78% predicted. She is followed by Dr. Valeta Harms  History of Present Illness: Pt called for follow up after flare of her bronchiectasis. She was seen virtually 10/19 2020. She was prescribed Doxycycline 100 mg BID x 7 days. She was asked to use her albuterol nebs twice daily to help as a mucolytic, we asked her to increase her flutter valve use to 4-5 breaths , 4-5 times daily, take Mucinex 1200 mg daily with a full glass of water, and use her Advair 2 puffs twice daily every day.  At follow up today she states she has completed her antibiotic therapy. She states she has been compliant with her flutter valve every day 3-4 times.  She states she has been using her Mucinex as a mucolytic.  She feels her cough is deeper, and that she has some throat irritation from her nebs and inhalers. She does have continued congestion. She states her secretions are not clear, but slightly yellow with brown. She has seen a little streak of old blood.  She states overall she is feeling slightly better. She denies any fever, but she has worsening shortness of breath. She has been throat clearing. She states she does have reflux. She is compliant with her Protonix.    Observations/Objective:   Assessment and Plan: Slow to resolve bronchiectasis flare Needs a CT Chest, but  wants to defer until after her daughter has cancer surgery this week. CT chest to check for progression of bronchiectasis vs fibrosis as cause of dyspnea. Plan: CT Chest without contrast week of  11/16 at [facility] look for progression of bronchiectasis and fibrosis Sputum  for Culture , AFB and Fungus Get sputum cups at [facility] when you go for your CT scan Continue Flutter valve as you have been doing Continue Mucinex with full glass of water Continue Advair  Twice daily as you have been doing Continue Protonix as you have been doing. Follow up with Dr. Valeta Harms 12/9 as is scheduled If you have any significant bleeding please go to the ED. Consider chest vest for better mucus clearance as CT imaging confirms bronchiectasis. Please contact office for sooner follow up if symptoms do not improve or worsen or seek emergency care   Follow Up Instructions:   I discussed the assessment and treatment plan with the patient. The patient was provided an opportunity to ask questions and all were answered. The patient agreed with the plan and demonstrated an understanding of the instructions.   The patient was advised to call back or seek an in-person evaluation if the symptoms worsen or if the condition fails to improve as anticipated.  I provided 35 minutes  minutes of non-face-to-face time during this encounter.   Magdalen Spatz, NP 10/12/2019 12:02 PM

## 2019-10-12 NOTE — Patient Instructions (Addendum)
It is great to talk with you today We will order a CT Chest without contrast the week of  11/16 at [facility] We will order Sputum  for Culture , AFB and Fungus Get sputum cups at [facility]  when you go for your CT scan Continue Flutter valve as you have been doing to help with your congestion  Continue Mucinex Daily  with full glass of water as you have been doing  Continue Advair  Twice daily as you have been doing Continue Protonix as you have been doing. Follow up with Dr. Valeta Harms 12/9 as is scheduled Please contact office for sooner follow up if symptoms do not improve or worsen or seek emergency care

## 2019-10-13 ENCOUNTER — Ambulatory Visit (INDEPENDENT_AMBULATORY_CARE_PROVIDER_SITE_OTHER): Payer: PPO | Admitting: Cardiovascular Disease

## 2019-10-13 ENCOUNTER — Encounter: Payer: Self-pay | Admitting: Cardiovascular Disease

## 2019-10-13 ENCOUNTER — Other Ambulatory Visit: Payer: Self-pay

## 2019-10-13 ENCOUNTER — Telehealth: Payer: Self-pay

## 2019-10-13 ENCOUNTER — Encounter: Payer: Self-pay | Admitting: Acute Care

## 2019-10-13 VITALS — BP 150/80 | HR 74 | Ht 68.0 in | Wt 156.0 lb

## 2019-10-13 DIAGNOSIS — I519 Heart disease, unspecified: Secondary | ICD-10-CM | POA: Diagnosis not present

## 2019-10-13 DIAGNOSIS — I5189 Other ill-defined heart diseases: Secondary | ICD-10-CM

## 2019-10-13 DIAGNOSIS — R000 Tachycardia, unspecified: Secondary | ICD-10-CM | POA: Diagnosis not present

## 2019-10-13 MED ORDER — LOSARTAN POTASSIUM 100 MG PO TABS: 100.0000 mg | ORAL_TABLET | Freq: Every day | ORAL | 3 refills | Status: DC

## 2019-10-13 MED ORDER — CARVEDILOL 12.5 MG PO TABS: 12.5000 mg | ORAL_TABLET | Freq: Two times a day (BID) | ORAL | 3 refills | Status: DC

## 2019-10-13 NOTE — Patient Instructions (Addendum)
Medication Instructions:  No changes  Consider taking a hydralazine 25 mg pill at 2 to 4 Am  If you need a refill on your cardiac medications before your next appointment, please call your pharmacy.    Lab work: No new labs needed   If you have labs (blood work) drawn today and your tests are completely normal, you will receive your results only by: Marland Kitchen MyChart Message (if you have MyChart) OR . A paper copy in the mail If you have any lab test that is abnormal or we need to change your treatment, we will call you to review the results.   Testing/Procedures: No new testing needed   Follow-Up: At [facility], you and your health needs are our priority.  As part of our continuing mission to provide you with exceptional heart care, we have created designated Provider Care Teams.  These Care Teams include your primary Cardiologist (physician) and Advanced Practice Providers (APPs -  Physician Assistants and Nurse Practitioners) who all work together to provide you with the care you need, when you need it.  . You will need a follow up appointment in 6 months .   Please call our office 2 months in advance to schedule this appointment.    . Providers on your designated Care Team:   . Murray Hodgkins, NP . Christell Faith, PA-C . Marrianne Mood, PA-C  Any Other Special Instructions Will Be Listed Below (If Applicable).  For educational health videos Log in to : [email or web address] Or : [email or web address], password : [email or web address]

## 2019-10-13 NOTE — Progress Notes (Signed)
Cardiology Office Note  Date:  10/13/2019   ID:  Christy Richardson, DOB January 06, 1935, MRN [id number]  PCP:  Celene Squibb, MD   Chief Complaint  Patient presents with  . Other    6 month follow up. Patient c/o SOB. Meds reviewed verbally with patient.     HPI:  Christy Richardson a 84 y.o.femalewith a history of  labile hypertension,  Anxiety mitral valve prolapse per the patient with no mitral valve regurgitation on echocardiogram 2015 with history of palpitations,  bronchiectasis,  GERD, goirter,  mixed hyperlipidemia,  as well as hypertension with documented grade 1 diastolic dysfunction.  She presents today for routine follow-up of her labile blood pressure and tachycardia  Presents today for further discussion concerning her blood pressures She is having very high blood pressures in the morning before her a.m. pills Blood pressure much better after she takes her Coreg down to 130s or so. Other times labile in the afternoon Would like to do something to get the blood pressure low first thing in the morning at 7:30 AM  Wakes 4 times overnight, nocturia  Finished ABX for congestion Has a "little cough", rare sputum Used nebulizer   Pain in her neck, chronic issue  Chronic leg weakness Sedentary lifestyle  Lab work reviewed HBa1c 5.5 Total chol 154 , LDL 80  Echo 07/2019 reviewed with her  1. The left ventricle has normal systolic function with an ejection fraction of 60-65%. The cavity size was normal. There is mildly increased left ventricular wall thickness. Left ventricular diastolic Doppler parameters are consistent with impaired  relaxation.  2. The right ventricle has normal systolic function. The cavity was normal. There is no increase in right ventricular wall thickness. Right ventricular systolic pressure is normal with an estimated pressure of 25.3 mmHg.  EKG personally reviewed by myself on todays visit Shows normal sinus rhythm rate 74 bpm  Other past  medical history reviewed  In October 2012 a nuclear perfusion study revealed normal perfusion  echo Doppler study revealed normal systolic function with grade 1 diastolic dysfunction, mild aortic valve sclerosis without stenosis, trace AR, MR, and mild TR as well as PR. She had upper normal RV pressure 26 mm.  echo Doppler study on 02/02/2014. This showed an ejection fraction of 60-65%. She had grade 1 diastolic dysfunction. There was mild aortic sclerosis without stenosis.    lexiscan perfusion study on 02/04/2014 was normal without scar or ischemia. Post stress ejection fraction was 73%.    CardioNet monitor to assess for palpitations. Unfortunately, she developed significant skin irritation, and only wore this for 9 days. This revealed episodes of sinus rhythm, but she did have episodes of sinus tachycardia with rates in the low 100s. There was there were no episodes of SVT or atrial fibrillation.   PMH:   has a past medical history of Adenomatous polyp of colon (03/2007), Allergic rhinitis, Allergy, Anxiety, Blood transfusion (1974), Bronchiectasis ([place]), Cancer ([place]), Depression, Diabetes mellitus without complication ([place]), Diastolic dysfunction, Diverticulosis of colon (without mention of hemorrhage), DOE (dyspnea on exertion), Fatigue, Fecal incontinence, GERD (gastroesophageal reflux disease), Glaucoma, Goiter, Heart murmur, History of stress test, Hyperlipidemia, Hypertension, Hypothyroidism, IBS (irritable bowel syndrome), Migraine, Mitral valve prolapse, Neck mass, Nonspecific abnormal electrocardiogram (ECG) (EKG), Palpitations, Palpitations, Polyp, sigmoid colon, Post-operative nausea and vomiting, Pulmonary nodules, Recurrent UTI, and Tremor.  PSH:    Past Surgical History:  Procedure Laterality Date  . ABDOMINAL HYSTERECTOMY    . CARDIOVASCULAR STRESS TEST  09/19/2011  No scintigraphic evidence of inducible myocardial ischemia. Pharmacological stress test without chest  pain or EKG changes for ischemia.  . COLONOSCOPY    . ESOPHAGOGASTRODUODENOSCOPY    . MASTOIDECTOMY     rt ear/ as a teenager  . OOPHORECTOMY    . SKIN CANCER EXCISION Left    arm  . TOTAL HIP ARTHROPLASTY  5/10   left  . TRANSTHORACIC ECHOCARDIOGRAM  09/19/2011   EF 123456, stage 1 diastolic dysfunction, mild tricuspid valve regurg    Current Outpatient Medications  Medication Sig Dispense Refill  . acetaminophen (TYLENOL) 500 MG tablet Take 500 mg by mouth as needed.    Marland Kitchen albuterol (PROVENTIL) (2.5 MG/3ML) 0.083% nebulizer solution Take 3 mLs (2.5 mg total) by nebulization every 6 (six) hours as needed for wheezing or shortness of breath. 75 mL 12  . brimonidine (ALPHAGAN) 0.2 % ophthalmic solution Place 1 drop into both eyes 2 (two) times daily. Place 1 drop into both eyes 2 (two) times daily.    . cetirizine (ZYRTEC) 10 MG tablet Take 10 mg by mouth daily.     . cyclobenzaprine (FLEXERIL) 10 MG tablet Take 10 mg by mouth 3 (three) times daily as needed for muscle spasms.    . dorzolamide (TRUSOPT) 2 % ophthalmic solution Place 1 drop into both eyes 2 (two) times daily. Place 1 drop into both eyes 2 (two) times daily.    . fluticasone-salmeterol (ADVAIR HFA) 115-21 MCG/ACT inhaler Inhale 2 puffs into the lungs 2 (two) times daily. 1 Inhaler 11  . hydrALAZINE (APRESOLINE) 25 MG tablet Take 2 tablets (50 mg total) by mouth 3 (three) times daily as needed (Take as needed for systolic blood pressure > 170.). 60 tablet 3  . levothyroxine (SYNTHROID, LEVOTHROID) 50 MCG tablet Take 50 mcg by mouth daily before breakfast.    . LORazepam (ATIVAN) 0.5 MG tablet Take 0.5 mg by mouth 2 (two) times daily as needed (tremors).    . Multiple Vitamins-Minerals (MULTIVITAMIN WITH MINERALS) tablet Take 1 tablet by mouth daily.      . pantoprazole (PROTONIX) 40 MG tablet Take 1 tablet (40 mg total) by mouth daily before breakfast. 90 tablet 3  . Polyethyl Glycol-Propyl Glycol (SYSTANE OP) Apply to eye  daily.    . pravastatin (PRAVACHOL) 40 MG tablet Take 1 tablet (40 mg total) by mouth at bedtime.    . sertraline (ZOLOFT) 100 MG tablet Take 1 tablet (100 mg total) by mouth at bedtime. 2 at bedtime    . sodium chloride (OCEAN NASAL SPRAY) 0.65 % nasal spray Place 2 sprays into both nostrils at bedtime. Use as needed    . traMADol (ULTRAM) 50 MG tablet Take 50 mg by mouth 2 (two) times daily as needed (pain).     . traZODone (DESYREL) 50 MG tablet Take 50 mg by mouth at bedtime.      . triamcinolone (NASACORT ALLERGY 24HR) 55 MCG/ACT AERO nasal inhaler Place 1 spray into the nose daily.    . carvedilol (COREG) 12.5 MG tablet Take 1 tablet (12.5 mg total) by mouth 2 (two) times daily. 180 tablet 3  . losartan (COZAAR) 100 MG tablet Take 1 tablet (100 mg total) by mouth daily. 90 tablet 3   No current facility-administered medications for this visit.      Allergies:   Prednisone, Adhesive [tape], Penicillins, and Sulfonamide derivatives   Social History:  The patient  reports that she has never smoked. She has never used smokeless tobacco. She reports  current alcohol use. She reports that she does not use drugs.   Family History:   family history includes Asthma in her daughter; Colon cancer in her paternal grandfather; Coronary artery disease in her mother; Diabetes in her father; Prostate cancer in her brother; Scoliosis in her daughter; Throat cancer in her mother.    Review of Systems: Review of Systems  Constitutional: Negative.   HENT: Negative.   Respiratory: Negative.   Cardiovascular: Negative.   Gastrointestinal: Negative.   Musculoskeletal: Negative.   Neurological: Negative.   Psychiatric/Behavioral: Negative.   All other systems reviewed and are negative.   PHYSICAL EXAM: VS:  BP (!) 150/80 (BP Location: Left Arm, Patient Position: Sitting, Cuff Size: Normal)   Pulse 74   Ht 5\' 8"  (1.727 m)   Wt 156 lb (70.8 kg)   BMI 23.72 kg/m  , BMI Body mass index is 23.72  kg/m. Constitutional:  oriented to person, place, and time. No distress.  HENT:  Head: Grossly normal Eyes:  no discharge. No scleral icterus.  Neck: No JVD, no carotid bruits  Cardiovascular: Regular rate and rhythm, no murmurs appreciated Pulmonary/Chest: Clear to auscultation bilaterally, no wheezes or rails Abdominal: Soft.  no distension.  no tenderness.  Musculoskeletal: Normal range of motion Neurological:  normal muscle tone. Coordination normal. No atrophy Skin: Skin warm and dry Psychiatric: normal affect, pleasant  Recent Labs: 11/06/2018: BUN 12; Creatinine, Ser 0.77; Hemoglobin 14.5; Platelets 226; Potassium 3.9; Sodium 139    Lipid Panel Lab Results  Component Value Date   CHOL 201 (H) 03/01/2009   HDL 47 03/01/2009   LDLCALC 116 (H) 03/01/2009   TRIG 189 (H) 03/01/2009      Wt Readings from Last 3 Encounters:  10/13/19 156 lb (70.8 kg)  06/24/19 155 lb 6.4 oz (70.5 kg)  11/06/18 155 lb (70.3 kg)     ASSESSMENT AND PLAN:  Hyperlipidemia LDL goal <70 - Plan: EKG 12-Lead Cholesterol is at goal on the current lipid regimen. No changes to the medications were made.  Stable  Essential hypertension - Plan: EKG 12-Lead Discussion concerning blood pressure Better controlled in the day after she takes her morning Coreg Symptoms markedly elevated 7:30 AM before any medication Sometimes dropping lower in the day -Recommend she try to take hydralazine before bed or 2 or 3 in the morning when she is up to have better pressures in the morning -Reports she typically gets up 4 times a night to go to the restroom  Bronchiectasis Reports having recent chest congestion treated with antibiotics Followed by pulmonary  Anxiety Reassurance provided   Total encounter time more than 25 minutes  Greater than 50% was spent in counseling and coordination of care with the patient  Disposition:   F/U  12 months   Orders Placed This Encounter  Procedures  . EKG 12-Lead      Signed, Esmond Plants, M.D., Ph.D. 10/13/2019  [place], [place]

## 2019-10-13 NOTE — Telephone Encounter (Signed)

## 2019-10-13 NOTE — Telephone Encounter (Signed)
Call to patient to review advice from Dr. Rockey Situ. Multiple triage calls for same complaint.  Spoke with patient, attempted to figure out which medications she is taking and when. Pt is not taking hydralazine on a normal basis so readings may not reflect taking this medication.   Pt still has some confusion about what medications she should be taking and when.   She is almost due for check up, I went ahead and put her on the schedule for this afternoon with Dr. Rockey Situ to further discuss HTN and appropriate medications and time.   Pt aware that she will need to bring BP readings and all medications to appt.

## 2019-10-22 ENCOUNTER — Other Ambulatory Visit: Payer: Self-pay

## 2019-10-22 ENCOUNTER — Ambulatory Visit (HOSPITAL_COMMUNITY)
Admission: RE | Admit: 2019-10-22 | Discharge: 2019-10-22 | Disposition: A | Discharge status: Home / Self Care | Payer: PPO | Source: Ambulatory Visit | Attending: Acute Care | Admitting: Acute Care

## 2019-10-22 DIAGNOSIS — H4010X0 Unspecified open-angle glaucoma, stage unspecified: Secondary | ICD-10-CM | POA: Diagnosis not present

## 2019-10-22 DIAGNOSIS — E782 Mixed hyperlipidemia: Secondary | ICD-10-CM | POA: Diagnosis not present

## 2019-10-22 DIAGNOSIS — E039 Hypothyroidism, unspecified: Secondary | ICD-10-CM | POA: Diagnosis not present

## 2019-10-22 DIAGNOSIS — R0602 Shortness of breath: Secondary | ICD-10-CM | POA: Diagnosis not present

## 2019-10-22 DIAGNOSIS — J310 Chronic rhinitis: Secondary | ICD-10-CM | POA: Diagnosis not present

## 2019-10-22 DIAGNOSIS — J471 Bronchiectasis with (acute) exacerbation: Secondary | ICD-10-CM | POA: Insufficient documentation

## 2019-10-22 DIAGNOSIS — R7301 Impaired fasting glucose: Secondary | ICD-10-CM | POA: Diagnosis not present

## 2019-10-22 DIAGNOSIS — R000 Tachycardia, unspecified: Secondary | ICD-10-CM | POA: Diagnosis not present

## 2019-10-22 DIAGNOSIS — I10 Essential (primary) hypertension: Secondary | ICD-10-CM | POA: Diagnosis not present

## 2019-10-22 DIAGNOSIS — J479 Bronchiectasis, uncomplicated: Secondary | ICD-10-CM | POA: Diagnosis not present

## 2019-10-22 DIAGNOSIS — K2970 Gastritis, unspecified, without bleeding: Secondary | ICD-10-CM | POA: Diagnosis not present

## 2019-10-22 DIAGNOSIS — K219 Gastro-esophageal reflux disease without esophagitis: Secondary | ICD-10-CM | POA: Diagnosis not present

## 2019-11-02 ENCOUNTER — Ambulatory Visit (HOSPITAL_COMMUNITY)
Admission: RE | Admit: 2019-11-02 | Discharge: 2019-11-02 | Disposition: A | Discharge status: Home / Self Care | Payer: PPO | Source: Ambulatory Visit | Attending: Internal Medicine | Admitting: Internal Medicine

## 2019-11-02 ENCOUNTER — Other Ambulatory Visit: Payer: Self-pay

## 2019-11-02 DIAGNOSIS — Z1231 Encounter for screening mammogram for malignant neoplasm of breast: Secondary | ICD-10-CM | POA: Insufficient documentation

## 2019-11-04 DIAGNOSIS — J112 Influenza due to unidentified influenza virus with gastrointestinal manifestations: Secondary | ICD-10-CM | POA: Diagnosis not present

## 2019-11-11 ENCOUNTER — Ambulatory Visit: Payer: PPO | Admitting: Pulmonary Disease

## 2019-11-11 ENCOUNTER — Other Ambulatory Visit: Payer: Self-pay

## 2019-11-11 ENCOUNTER — Encounter: Payer: Self-pay | Admitting: Pulmonary Disease

## 2019-11-11 VITALS — BP 142/90 | HR 77 | Temp 98.0°F | Ht 68.0 in | Wt 159.4 lb

## 2019-11-11 DIAGNOSIS — J479 Bronchiectasis, uncomplicated: Secondary | ICD-10-CM

## 2019-11-11 DIAGNOSIS — R0600 Dyspnea, unspecified: Secondary | ICD-10-CM

## 2019-11-11 DIAGNOSIS — Z7689 Persons encountering health services in other specified circumstances: Secondary | ICD-10-CM

## 2019-11-11 DIAGNOSIS — R0609 Other forms of dyspnea: Secondary | ICD-10-CM

## 2019-11-11 MED ORDER — STIOLTO RESPIMAT 2.5-2.5 MCG/ACT IN AERS: 2.0000 | INHALATION_SPRAY | Freq: Every day | RESPIRATORY_TRACT | 0 refills | Status: DC

## 2019-11-11 NOTE — Patient Instructions (Addendum)
Thank you for visiting Dr. Valeta Harms at [facility]. Today we recommend the following:  Stop Advair  Start Stiolto  Continue flutter valve  Start vest therapy  Sputum cultures, resp cultures, AFB, Fungus   Return in about 3 months (around 02/09/2020) for with APP or Dr. Valeta Harms.    Please do your part to reduce the spread of COVID-19.

## 2019-11-11 NOTE — Progress Notes (Signed)
Synopsis: Referred in December 2020 for former patient Dr. Lake Bells, PCP: Celene Squibb, MD  Subjective:   PATIENT ID: Christy Richardson: July 25, 1935, MRN: [id number]  Chief Complaint  Patient presents with  . Follow-up    Former BQ patient. She reports her SOB has been at her baseline. She reports her cough has been a little worse than usual. CT 10/23/2019. Using advair daily.     Last seen by Eric Form for bronchiectasis flare.  Followed by Dr. Lake Bells for bronchiectasis and fibrosis.  Patient with recent CT scan of the chest in November with bilateral waxing and waning tree-in-bud nodularity small pulmonary nodules 5 mm in size.  Last seen by Dr. Lake Bells in May 2019.  She had childhood pertussis.  She has been doing pulmonary rehabilitation up until Covid.  Currently managed with flutter valve.  She is also using Advair.  OV 11/11/2019: Here today to establish care with new primary pulmonary provider.  Patient doing well today.  She still does have daily sputum production.  She has been using her flutter valve.  Most recently has not used it daily but if she remembers to she will use it 2-3 times a day.  She has no one at home to help her with postural drainage or CPT.  She was unable to obtain sputum cultures recently.  No fevers chills night sweats weight loss.  No hemoptysis recently.  During her last exacerbation she did have a few streaks of blood.    Past Medical History:  Diagnosis Date  . Adenomatous polyp of colon 03/2007  . Allergic rhinitis   . Allergy    has a lot congestion  . Anxiety   . Blood transfusion 1974  . Bronchiectasis ([place])   . Cancer ([place])    left arm  . Depression   . Diabetes mellitus without complication (HCC)    no meds  . Diastolic dysfunction    a. 01/2014 Echo: EF 60-65%, no rwma, Gr1 DD, Ao sclerosis w/o stenosis. Mild TR.  . Diverticulosis of colon (without mention of hemorrhage)   . DOE (dyspnea on exertion)   . Fatigue   .  Fecal incontinence   . GERD (gastroesophageal reflux disease)   . Glaucoma   . Goiter    right side  . Heart murmur   . History of stress test    a. 01/2014 Lexi MV: no ischemia/infarct.  . Hyperlipidemia   . Hypertension    a. 11/2018 Renal artery duplex: No RAS.   Marland Kitchen Hypothyroidism   . IBS (irritable bowel syndrome)   . Migraine   . Mitral valve prolapse    a. 01/2014 Echo: no MR/MVP noted.  . Neck mass    right side/ no airway problems/ per pt has had since 2005 (goiter)  . Nonspecific abnormal electrocardiogram (ECG) (EKG)   . Palpitations   . Palpitations    a. 01/2014 CardioNet: wore for 9 days (rash). Sinus tach - 100's. No SVT/AF.  Marland Kitchen Polyp, sigmoid colon   . Post-operative nausea and vomiting   . Pulmonary nodules   . Recurrent UTI   . Tremor      Family History  Problem Relation Age of Onset  . Diabetes Father   . Throat cancer Mother   . Coronary artery disease Mother   . Prostate cancer Brother   . Asthma Daughter   . Scoliosis Daughter   . Colon cancer Paternal Grandfather   . Esophageal cancer Neg Hx   .  Rectal cancer Neg Hx   . Stomach cancer Neg Hx      Past Surgical History:  Procedure Laterality Date  . ABDOMINAL HYSTERECTOMY    . CARDIOVASCULAR STRESS TEST  09/19/2011   No scintigraphic evidence of inducible myocardial ischemia. Pharmacological stress test without chest pain or EKG changes for ischemia.  . COLONOSCOPY    . ESOPHAGOGASTRODUODENOSCOPY    . MASTOIDECTOMY     rt ear/ as a teenager  . OOPHORECTOMY    . SKIN CANCER EXCISION Left    arm  . TOTAL HIP ARTHROPLASTY  5/10   left  . TRANSTHORACIC ECHOCARDIOGRAM  09/19/2011   EF 123456, stage 1 diastolic dysfunction, mild tricuspid valve regurg    Social History   Socioeconomic History  . Marital status: Widowed    Spouse name: Not on file  . Number of children: 2  . Years of education: 10th grade  . Highest education level: Not on file  Occupational History  . Occupation: Retired     Comment: Lexicographer  . Financial resource strain: Not on file  . Food insecurity    Worry: Not on file    Inability: Not on file  . Transportation needs    Medical: Not on file    Non-medical: Not on file  Tobacco Use  . Smoking status: Never Smoker  . Smokeless tobacco: Never Used  Substance and Sexual Activity  . Alcohol use: Yes    Comment: rare  . Drug use: No  . Sexual activity: Not on file  Lifestyle  . Physical activity    Days per week: Not on file    Minutes per session: Not on file  . Stress: Not on file  Relationships  . Social Herbalist on phone: Not on file    Gets together: Not on file    Attends religious service: Not on file    Active member of club or organization: Not on file    Attends meetings of clubs or organizations: Not on file    Relationship status: Not on file  . Intimate partner violence    Fear of current or ex partner: Not on file    Emotionally abused: Not on file    Physically abused: Not on file    Forced sexual activity: Not on file  Other Topics Concern  . Not on file  Social History Narrative   Widowed since 2002. Lives with her Daughter.     Allergies  Allergen Reactions  . Prednisone     REACTION: Increase eye pressure with gloucoma. Broke out in rash after injection per Dr. Percell Miller  . Adhesive [Tape]     Causes blisters/irritates skin  . Penicillins     REACTION: Rash  . Sulfonamide Derivatives     REACTION: Rash - not sure     Outpatient Medications Prior to Visit  Medication Sig Dispense Refill  . acetaminophen (TYLENOL) 500 MG tablet Take 500 mg by mouth as needed.    Marland Kitchen albuterol (PROVENTIL) (2.5 MG/3ML) 0.083% nebulizer solution Take 3 mLs (2.5 mg total) by nebulization every 6 (six) hours as needed for wheezing or shortness of breath. 75 mL 12  . brimonidine (ALPHAGAN) 0.2 % ophthalmic solution Place 1 drop into both eyes 2 (two) times daily. Place 1 drop into both eyes 2 (two) times daily.     . carvedilol (COREG) 12.5 MG tablet Take 1 tablet (12.5 mg total) by mouth 2 (two) times daily. [place]  tablet 3  . cetirizine (ZYRTEC) 10 MG tablet Take 10 mg by mouth daily.     . cyclobenzaprine (FLEXERIL) 10 MG tablet Take 10 mg by mouth 3 (three) times daily as needed for muscle spasms.    . dorzolamide (TRUSOPT) 2 % ophthalmic solution Place 1 drop into both eyes 2 (two) times daily. Place 1 drop into both eyes 2 (two) times daily.    . fluticasone-salmeterol (ADVAIR HFA) 115-21 MCG/ACT inhaler Inhale 2 puffs into the lungs 2 (two) times daily. 1 Inhaler 11  . hydrALAZINE (APRESOLINE) 25 MG tablet Take 2 tablets (50 mg total) by mouth 3 (three) times daily as needed (Take as needed for systolic blood pressure > 170.). 60 tablet 3  . levothyroxine (SYNTHROID, LEVOTHROID) 50 MCG tablet Take 50 mcg by mouth daily before breakfast.    . LORazepam (ATIVAN) 0.5 MG tablet Take 0.5 mg by mouth 2 (two) times daily as needed (tremors).    . losartan (COZAAR) 100 MG tablet Take 1 tablet (100 mg total) by mouth daily. 90 tablet 3  . Multiple Vitamins-Minerals (MULTIVITAMIN WITH MINERALS) tablet Take 1 tablet by mouth daily.      . pantoprazole (PROTONIX) 40 MG tablet Take 1 tablet (40 mg total) by mouth daily before breakfast. 90 tablet 3  . Polyethyl Glycol-Propyl Glycol (SYSTANE OP) Apply to eye daily.    . pravastatin (PRAVACHOL) 40 MG tablet Take 1 tablet (40 mg total) by mouth at bedtime.    . sertraline (ZOLOFT) 100 MG tablet Take 1 tablet (100 mg total) by mouth at bedtime. 2 at bedtime    . sodium chloride (OCEAN NASAL SPRAY) 0.65 % nasal spray Place 2 sprays into both nostrils at bedtime. Use as needed    . traMADol (ULTRAM) 50 MG tablet Take 50 mg by mouth 2 (two) times daily as needed (pain).     . traZODone (DESYREL) 50 MG tablet Take 50 mg by mouth at bedtime.      . triamcinolone (NASACORT ALLERGY 24HR) 55 MCG/ACT AERO nasal inhaler Place 1 spray into the nose daily.     No  facility-administered medications prior to visit.     Review of Systems  Constitutional: Negative for chills, fever, malaise/fatigue and weight loss.  HENT: Negative for hearing loss, sore throat and tinnitus.   Eyes: Negative for blurred vision and double vision.  Respiratory: Positive for cough, hemoptysis, sputum production and shortness of breath. Negative for wheezing and stridor.   Cardiovascular: Negative for chest pain, palpitations, orthopnea, leg swelling and PND.  Gastrointestinal: Negative for abdominal pain, constipation, diarrhea, heartburn, nausea and vomiting.  Genitourinary: Negative for dysuria, hematuria and urgency.  Musculoskeletal: Negative for joint pain and myalgias.  Skin: Negative for itching and rash.  Neurological: Negative for dizziness, tingling, weakness and headaches.  Endo/Heme/Allergies: Negative for environmental allergies. Does not bruise/bleed easily.  Psychiatric/Behavioral: Negative for depression. The patient is not nervous/anxious and does not have insomnia.   All other systems reviewed and are negative.    Objective:  Physical Exam Vitals signs reviewed.  Constitutional:      General: She is not in acute distress.    Appearance: She is well-developed.  HENT:     Head: Normocephalic and atraumatic.  Eyes:     General: No scleral icterus.    Conjunctiva/sclera: Conjunctivae normal.     Pupils: Pupils are equal, round, and reactive to light.  Neck:     Musculoskeletal: Neck supple.     Vascular: No JVD.  Trachea: No tracheal deviation.  Cardiovascular:     Rate and Rhythm: Normal rate and regular rhythm.     Heart sounds: Normal heart sounds. No murmur.  Pulmonary:     Effort: Pulmonary effort is normal. No tachypnea, accessory muscle usage or respiratory distress.     Breath sounds: No stridor. Rhonchi present. No wheezing or rales.  Abdominal:     General: Bowel sounds are normal. There is no distension.     Palpations: Abdomen  is soft.     Tenderness: There is no abdominal tenderness.  Musculoskeletal:        General: No tenderness.  Lymphadenopathy:     Cervical: No cervical adenopathy.  Skin:    General: Skin is warm and dry.     Capillary Refill: Capillary refill takes less than 2 seconds.     Findings: No rash.  Neurological:     Mental Status: She is alert and oriented to person, place, and time.  Psychiatric:        Behavior: Behavior normal.      Vitals:   11/11/19 1342  BP: (!) 142/90  Pulse: 77  Temp: 98 F (36.7 C)  TempSrc: Temporal  SpO2: 98%  Weight: 159 lb 6.4 oz (72.3 kg)  Height: 5\' 8"  (1.727 m)   98% on RA BMI Readings from Last 3 Encounters:  11/11/19 24.24 kg/m  10/13/19 23.72 kg/m  06/24/19 23.63 kg/m   Wt Readings from Last 3 Encounters:  11/11/19 159 lb 6.4 oz (72.3 kg)  10/13/19 156 lb (70.8 kg)  06/24/19 155 lb 6.4 oz (70.5 kg)     CBC    Component Value Date/Time   WBC 5.3 11/06/2018 0754   RBC 4.87 11/06/2018 0754   HGB 14.5 11/06/2018 0754   HGB CANCELED 11/04/2018 1114   HCT 43.8 11/06/2018 0754   HCT CANCELED 11/04/2018 1114   PLT 226 11/06/2018 0754   PLT CANCELED 11/04/2018 1114   MCV 89.9 11/06/2018 0754   MCH 29.8 11/06/2018 0754   MCHC 33.1 11/06/2018 0754   RDW 12.7 11/06/2018 0754   LYMPHSABS 1.1 11/06/2018 0754   LYMPHSABS CANCELED 11/04/2018 1114   MONOABS 0.3 11/06/2018 0754   EOSABS 0.2 11/06/2018 0754   EOSABS CANCELED 11/04/2018 1114   BASOSABS 0.0 11/06/2018 0754   BASOSABS CANCELED 11/04/2018 1114      Chest Imaging: November 2020: CT chest Bronchiectasis scattered tree-in-bud nodularities. The patient's images have been independently reviewed by me.    Pulmonary Functions Testing Results: PFT Results Latest Ref Rng & Units 02/25/2018  FVC-Pre L 3.15  FVC-Predicted Pre % 106  Pre FEV1/FVC % % 61  FEV1-Pre L 1.92  FEV1-Predicted Pre % 86  DLCO UNC% % 51  DLCO COR %Predicted % 58  TLC L 5.53  TLC % Predicted % 100   RV % Predicted % 86    FeNO: None   Pathology: None   Echocardiogram: None   Heart Catheterization: None     Assessment & Plan:     ICD-10-CM   1. Bronchiectasis without complication ([place])  [id number] Ambulatory Referral for DME  2. Dyspnea, unspecified type  R06.00   3. Dyspnea on exertion  R06.00   4. Establishing care with new doctor, encounter for  Z76.89     Discussion:  This is an 84 year old female with bronchiectasis.  No current exacerbation.  She does have obstructive lung disease from this and shortness of breath with dyspnea on exertion.  She does have occasional  wheezing.  Plan: We will stop her Advair disk  Switch to Stiolto.  We will attempt to avoid ICS. She needs to continue use of her flutter valve We will give her a prescription for vest therapy.  She has been on flutter valve for a while with an effective airway clearance.  She is also had additional exacerbations.  She is also unable to do CPT or postural drainage by herself.  Therefore I believe the patient will benefit from vest therapy.  We will also send for repeat sputum cultures today to include respiratory cultures, aerobic anaerobic AFB and fungus.  If she has a repeat exacerbation and cultures remain negative we may consider azithromycin Monday Wednesday Friday in the future.  We also may consider asking my colleagues Dr. Carlis Abbott to review the case.  Greater than 50% of this patient's 25-minute office was been face-to-face discussing above recommendations and treatment plan.   Current Outpatient Medications:  .  acetaminophen (TYLENOL) 500 MG tablet, Take 500 mg by mouth as needed., Disp: , Rfl:  .  albuterol (PROVENTIL) (2.5 MG/3ML) 0.083% nebulizer solution, Take 3 mLs (2.5 mg total) by nebulization every 6 (six) hours as needed for wheezing or shortness of breath., Disp: 75 mL, Rfl: 12 .  brimonidine (ALPHAGAN) 0.2 % ophthalmic solution, Place 1 drop into both eyes 2 (two) times daily. Place 1  drop into both eyes 2 (two) times daily., Disp: , Rfl:  .  carvedilol (COREG) 12.5 MG tablet, Take 1 tablet (12.5 mg total) by mouth 2 (two) times daily., Disp: 180 tablet, Rfl: 3 .  cetirizine (ZYRTEC) 10 MG tablet, Take 10 mg by mouth daily. , Disp: , Rfl:  .  cyclobenzaprine (FLEXERIL) 10 MG tablet, Take 10 mg by mouth 3 (three) times daily as needed for muscle spasms., Disp: , Rfl:  .  dorzolamide (TRUSOPT) 2 % ophthalmic solution, Place 1 drop into both eyes 2 (two) times daily. Place 1 drop into both eyes 2 (two) times daily., Disp: , Rfl:  .  fluticasone-salmeterol (ADVAIR HFA) 115-21 MCG/ACT inhaler, Inhale 2 puffs into the lungs 2 (two) times daily., Disp: 1 Inhaler, Rfl: 11 .  hydrALAZINE (APRESOLINE) 25 MG tablet, Take 2 tablets (50 mg total) by mouth 3 (three) times daily as needed (Take as needed for systolic blood pressure > 170.)., Disp: 60 tablet, Rfl: 3 .  levothyroxine (SYNTHROID, LEVOTHROID) 50 MCG tablet, Take 50 mcg by mouth daily before breakfast., Disp: , Rfl:  .  LORazepam (ATIVAN) 0.5 MG tablet, Take 0.5 mg by mouth 2 (two) times daily as needed (tremors)., Disp: , Rfl:  .  losartan (COZAAR) 100 MG tablet, Take 1 tablet (100 mg total) by mouth daily., Disp: 90 tablet, Rfl: 3 .  Multiple Vitamins-Minerals (MULTIVITAMIN WITH MINERALS) tablet, Take 1 tablet by mouth daily.  , Disp: , Rfl:  .  pantoprazole (PROTONIX) 40 MG tablet, Take 1 tablet (40 mg total) by mouth daily before breakfast., Disp: 90 tablet, Rfl: 3 .  Polyethyl Glycol-Propyl Glycol (SYSTANE OP), Apply to eye daily., Disp: , Rfl:  .  pravastatin (PRAVACHOL) 40 MG tablet, Take 1 tablet (40 mg total) by mouth at bedtime., Disp: , Rfl:  .  sertraline (ZOLOFT) 100 MG tablet, Take 1 tablet (100 mg total) by mouth at bedtime. 2 at bedtime, Disp: , Rfl:  .  sodium chloride (OCEAN NASAL SPRAY) 0.65 % nasal spray, Place 2 sprays into both nostrils at bedtime. Use as needed, Disp: , Rfl:  .  traMADol (  ULTRAM) 50 MG tablet,  Take 50 mg by mouth 2 (two) times daily as needed (pain). , Disp: , Rfl:  .  traZODone (DESYREL) 50 MG tablet, Take 50 mg by mouth at bedtime.  , Disp: , Rfl:  .  triamcinolone (NASACORT ALLERGY 24HR) 55 MCG/ACT AERO nasal inhaler, Place 1 spray into the nose daily., Disp: , Rfl:    Garner Nash, DO  Pulmonary Critical Care 11/11/2019 1:47 PM

## 2019-11-13 ENCOUNTER — Telehealth: Payer: Self-pay | Admitting: Acute Care

## 2019-11-13 NOTE — Telephone Encounter (Signed)
Called and spoke with pt letting her know that it should be okay to drop sputum culture off at [facility]. Pt verbalized understanding. Nothing further needed.

## 2019-11-16 ENCOUNTER — Telehealth: Payer: Self-pay | Admitting: Acute Care

## 2019-11-16 NOTE — Telephone Encounter (Signed)
Called and spoke with Patient.  Patient stated she saw Dr. Valeta Harms last week and he took her off Advair and placed her on Stiolto. Patient stated Dr. Valeta Harms wanted her off steroids.  Patient stated she can not afford Stiolto, because it is not on her formulary.  Advised Patient to contact her insurance company and ask what is on her formulary in [place] place.  Patient stated understanding.  Nothing further at this time.

## 2019-12-05 DIAGNOSIS — J112 Influenza due to unidentified influenza virus with gastrointestinal manifestations: Secondary | ICD-10-CM | POA: Diagnosis not present

## 2019-12-10 ENCOUNTER — Telehealth: Payer: Self-pay | Admitting: Pulmonary Disease

## 2019-12-10 ENCOUNTER — Telehealth: Payer: Self-pay | Admitting: Acute Care

## 2019-12-10 MED ORDER — STIOLTO RESPIMAT 2.5-2.5 MCG/ACT IN AERS: 2.0000 | INHALATION_SPRAY | Freq: Every day | RESPIRATORY_TRACT | 0 refills | Status: DC

## 2019-12-10 NOTE — Telephone Encounter (Signed)
Will forward to my box to follow.

## 2019-12-10 NOTE — Telephone Encounter (Signed)
Called and spoke with pt letting her know that Dr. Valeta Harms said it was okay for her to use samples until we knew if she was going to be approved and she verbalized understanding. Nothing further needed.

## 2019-12-10 NOTE — Telephone Encounter (Signed)
Yes ok to use samples until approved [facility] 12/10/2019 4:52 PM

## 2019-12-10 NOTE — Telephone Encounter (Signed)
Patient stopped by the office today. She was requesting a sample of Stiolto. She stated that her insurance will not cover Stiolto and he has applied for patient assistance. I provided her with a sample of Stiolto today. Based on her insurance and doing PAs for Stiolto before, if we were to get the PA approved, it be $90 a month.   She wanted to know if it would be ok for her to use the Advair if the samples of Stiolto run out before she gets approved. Advised her that I would send a message to Dr. Valeta Harms for her. She verbalized understanding.   She wishes to be called at [phone].   Dr. Valeta Harms, please advise. Thanks!

## 2019-12-11 DIAGNOSIS — R000 Tachycardia, unspecified: Secondary | ICD-10-CM | POA: Diagnosis not present

## 2019-12-11 DIAGNOSIS — H4010X0 Unspecified open-angle glaucoma, stage unspecified: Secondary | ICD-10-CM | POA: Diagnosis not present

## 2019-12-11 DIAGNOSIS — J479 Bronchiectasis, uncomplicated: Secondary | ICD-10-CM | POA: Diagnosis not present

## 2019-12-11 DIAGNOSIS — R7301 Impaired fasting glucose: Secondary | ICD-10-CM | POA: Diagnosis not present

## 2019-12-11 DIAGNOSIS — J310 Chronic rhinitis: Secondary | ICD-10-CM | POA: Diagnosis not present

## 2019-12-11 DIAGNOSIS — K219 Gastro-esophageal reflux disease without esophagitis: Secondary | ICD-10-CM | POA: Diagnosis not present

## 2019-12-11 DIAGNOSIS — K2970 Gastritis, unspecified, without bleeding: Secondary | ICD-10-CM | POA: Diagnosis not present

## 2019-12-11 DIAGNOSIS — E7849 Other hyperlipidemia: Secondary | ICD-10-CM | POA: Diagnosis not present

## 2019-12-11 DIAGNOSIS — E039 Hypothyroidism, unspecified: Secondary | ICD-10-CM | POA: Diagnosis not present

## 2019-12-11 DIAGNOSIS — I10 Essential (primary) hypertension: Secondary | ICD-10-CM | POA: Diagnosis not present

## 2019-12-15 NOTE — Telephone Encounter (Signed)
Received pt assistance form for [employer]. However, pt did not complete her portion of the income section. Pt signed each page but did not put any other information. Will need to call pt on 1/13 to inquire about her household income.

## 2019-12-16 NOTE — Telephone Encounter (Signed)
Called and spoke to pt. Pt was able to to provide me the information over the phone and I completed that section of the application for her. Pt aware the form will be sent to [facility] cares for her Stiolto. Will keep message in my box to follow up on.

## 2019-12-17 ENCOUNTER — Other Ambulatory Visit: Payer: Self-pay

## 2019-12-17 ENCOUNTER — Other Ambulatory Visit (HOSPITAL_COMMUNITY)
Admission: RE | Admit: 2019-12-17 | Discharge: 2019-12-17 | Disposition: A | Discharge status: Home / Self Care | Payer: PPO | Source: Ambulatory Visit | Attending: Acute Care | Admitting: Acute Care

## 2019-12-17 DIAGNOSIS — J471 Bronchiectasis with (acute) exacerbation: Secondary | ICD-10-CM | POA: Diagnosis not present

## 2019-12-20 LAB — ACID FAST SMEAR (AFB, MYCOBACTERIA): Acid Fast Smear: NEGATIVE

## 2019-12-23 ENCOUNTER — Telehealth: Payer: Self-pay | Admitting: Pulmonary Disease

## 2019-12-23 MED ORDER — ALBUTEROL SULFATE (2.5 MG/3ML) 0.083% IN NEBU: 2.5000 mg | INHALATION_SOLUTION | Freq: Four times a day (QID) | RESPIRATORY_TRACT | 12 refills | Status: DC | PRN

## 2019-12-23 NOTE — Telephone Encounter (Signed)
A refill for patient's Albuterol nebulizer medication has been sent to [facility] and patient has been made aware.

## 2019-12-28 NOTE — Telephone Encounter (Signed)
Typically turnaround takes about a week as long as all required documents were sent in. Determination typically is faxed with attention to the provider. Nothing has been received or misplaced in the pharmacy folder.   [employer] will provide updates and confirm receipt of application by calling [phone].

## 2019-12-28 NOTE — Telephone Encounter (Signed)
Question, what is the average approval time from [facility] regarding patient application? Has there been anything received on your end?

## 2019-12-30 DIAGNOSIS — R7301 Impaired fasting glucose: Secondary | ICD-10-CM | POA: Diagnosis not present

## 2019-12-30 DIAGNOSIS — I10 Essential (primary) hypertension: Secondary | ICD-10-CM | POA: Diagnosis not present

## 2019-12-30 DIAGNOSIS — E782 Mixed hyperlipidemia: Secondary | ICD-10-CM | POA: Diagnosis not present

## 2019-12-30 DIAGNOSIS — E039 Hypothyroidism, unspecified: Secondary | ICD-10-CM | POA: Diagnosis not present

## 2020-01-01 DIAGNOSIS — H903 Sensorineural hearing loss, bilateral: Secondary | ICD-10-CM | POA: Diagnosis not present

## 2020-01-01 DIAGNOSIS — J340 Abscess, furuncle and carbuncle of nose: Secondary | ICD-10-CM | POA: Diagnosis not present

## 2020-01-01 DIAGNOSIS — H7011 Chronic mastoiditis, right ear: Secondary | ICD-10-CM | POA: Diagnosis not present

## 2020-01-04 DIAGNOSIS — J310 Chronic rhinitis: Secondary | ICD-10-CM | POA: Diagnosis not present

## 2020-01-04 DIAGNOSIS — J479 Bronchiectasis, uncomplicated: Secondary | ICD-10-CM | POA: Diagnosis not present

## 2020-01-04 DIAGNOSIS — E039 Hypothyroidism, unspecified: Secondary | ICD-10-CM | POA: Diagnosis not present

## 2020-01-04 DIAGNOSIS — K219 Gastro-esophageal reflux disease without esophagitis: Secondary | ICD-10-CM | POA: Diagnosis not present

## 2020-01-04 DIAGNOSIS — R7301 Impaired fasting glucose: Secondary | ICD-10-CM | POA: Diagnosis not present

## 2020-01-04 DIAGNOSIS — E782 Mixed hyperlipidemia: Secondary | ICD-10-CM | POA: Diagnosis not present

## 2020-01-04 DIAGNOSIS — K2970 Gastritis, unspecified, without bleeding: Secondary | ICD-10-CM | POA: Diagnosis not present

## 2020-01-04 DIAGNOSIS — I10 Essential (primary) hypertension: Secondary | ICD-10-CM | POA: Diagnosis not present

## 2020-01-04 DIAGNOSIS — H4010X0 Unspecified open-angle glaucoma, stage unspecified: Secondary | ICD-10-CM | POA: Diagnosis not present

## 2020-01-04 DIAGNOSIS — R000 Tachycardia, unspecified: Secondary | ICD-10-CM | POA: Diagnosis not present

## 2020-01-04 DIAGNOSIS — N1831 Chronic kidney disease, stage 3a: Secondary | ICD-10-CM | POA: Diagnosis not present

## 2020-01-04 DIAGNOSIS — M542 Cervicalgia: Secondary | ICD-10-CM | POA: Diagnosis not present

## 2020-01-12 NOTE — Telephone Encounter (Signed)
ATC [employer] and was placed on hold for >15 min. WCB.

## 2020-01-18 NOTE — Telephone Encounter (Signed)
Called [employer] and spoke with rep, was advised they have not received anything. Forms have been refaxed and a confirmation has been received. Will call back in a couple days to see if they have paperwork.

## 2020-01-19 DIAGNOSIS — H7011 Chronic mastoiditis, right ear: Secondary | ICD-10-CM | POA: Diagnosis not present

## 2020-01-19 DIAGNOSIS — J340 Abscess, furuncle and carbuncle of nose: Secondary | ICD-10-CM | POA: Diagnosis not present

## 2020-01-20 LAB — FUNGUS CULTURE WITH STAIN

## 2020-01-20 LAB — FUNGAL ORGANISM REFLEX

## 2020-01-20 LAB — FUNGUS CULTURE RESULT

## 2020-01-27 NOTE — Telephone Encounter (Signed)
Called and spoke to [facility] and was advised they did receive the forms but on page 4 the page had several lines from fax and they are needing this refaxed. The entire patient assistance paperwork has been faxed again with confirmation that it was received. Will follow back up.

## 2020-02-01 LAB — ACID FAST CULTURE WITH REFLEXED SENSITIVITIES (MYCOBACTERIA): Acid Fast Culture: NEGATIVE

## 2020-02-04 ENCOUNTER — Telehealth: Payer: Self-pay | Admitting: Pulmonary Disease

## 2020-02-04 NOTE — Telephone Encounter (Signed)
Called [employer] at [phone] and was advised the form is still cutting off the providers signature. A verbal order for the stiolto was given. [employer] is still needing proof of income from pt, informed her to call [employer] and see what info they need. Will keep open to follow up in a couple days.

## 2020-02-04 NOTE — Telephone Encounter (Signed)
Called spoke with patient to verify the message she left for the office.  Fax number also verified.  Daneil Dan, do you still have the patient assistance for patient's Stiolto?  That single page can be refaxed, with a coversheet including patient's name and dob.

## 2020-02-04 NOTE — Telephone Encounter (Signed)
See message from 02/04/20. Will sign off.

## 2020-02-09 NOTE — Telephone Encounter (Signed)
Have we received anything back from [facility]? Thanks.

## 2020-02-09 NOTE — Telephone Encounter (Signed)
No, nothing received via fax.  Called [employer] and was on hold for over 20 min. Will need to call back on 3/10 as this has been a lengthy process trying to get pt approved for pt assistance.

## 2020-02-15 ENCOUNTER — Other Ambulatory Visit: Payer: Self-pay

## 2020-02-15 ENCOUNTER — Encounter: Payer: Self-pay | Admitting: Acute Care

## 2020-02-15 ENCOUNTER — Ambulatory Visit (INDEPENDENT_AMBULATORY_CARE_PROVIDER_SITE_OTHER): Payer: PPO | Admitting: Acute Care

## 2020-02-15 DIAGNOSIS — J479 Bronchiectasis, uncomplicated: Secondary | ICD-10-CM

## 2020-02-15 MED ORDER — ZITHROMAX Z-PAK 250 MG PO TABS: ORAL_TABLET | ORAL | 0 refills | Status: DC

## 2020-02-15 NOTE — Telephone Encounter (Signed)
Called [employer] and was on hold for 15 minutes with no option to leave message. Wcb.

## 2020-02-15 NOTE — Progress Notes (Signed)
Virtual Visit via Telephone Note  I connected with Christy Richardson on 02/15/20 at  4:00 PM EDT by telephone and verified that I am speaking with the correct person using two identifiers.  Location: Patient: At home Provider: Working virtually from home   I discussed the limitations, risks, security and privacy concerns of performing an evaluation and management service by telephone and the availability of in person appointments. I also discussed with the patient that there may be a patient responsible charge related to this service. The patient expressed understanding and agreed to proceed.  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 She switched care from [facility] to [facility] (Pulmonary) in 05/2012. Her simple spirometry in 2013 was 78% predicted. She is now followed by Dr. Valeta Harms  Maintenance>> Stialto Rescue>> Albuterol She cannot afford chest vest  Recent Flares>> 09/2019 Treated with Doxycycline 02/2020>> Treated with Z pack  History of Present Illness: Pt. Presents for an acute visit for flare of her bronchiectasis.She states she has cough , tightness in her chest and wheezing. Pt. States this has been going on for the  last 2-3 weeks. She states her congestion has a strange taste. This is how she knows she needs antibiotics.She is using her flutter valve and chest vest.She states her secretions are thick and  a yellow color. She does take Zyrtec dily for seasonal allergies. We did try to culture her sputum in November, but her sputum was rejected for culture by the lab. She is trying to get financial assistance for her [place]. She states she will not be able to afford the [facility] if she does not get assistance. She does have albuterol nebs, but she has not used them  yet. She denies any fever. Chest pain, orthopnea of hemoptysis.   Observations/Objective: 10/2019 CT Chest Similar appearance of basilar predominant bronchiectatic  changes with waxing and waning of peripheral bilateral nodularity, having tree-in-bud configuration in some areas. Dominant new nodules today measure up to 5 mm in the left lung. No follow-up needed if patient is low-risk. Non-contrast chest CT can be considered in 12 months if patient is high-risk.  PFT 01/2018 Results for DIANIA, REASON (MRN [id number]) as of 02/15/2020 15:58  Ref. Range 02/25/2018 10:55  FVC-Pre Latest Units: L 3.15  FVC-%Pred-Pre Latest Units: % 106  FEV1-Pre Latest Units: L 1.92  FEV1-%Pred-Pre Latest Units: % 86  Pre FEV1/FVC ratio Latest Units: % 61  FEV1FVC-%Pred-Pre Latest Units: % 82  FEF 25-75 Pre Latest Units: L/sec 0.88  FEF2575-%Pred-Pre Latest Units: % 56  FEV6-Pre Latest Units: L 2.98  FEV6-%Pred-Pre Latest Units: % 106  Pre FEV6/FVC Ratio Latest Units: % 95  FEV6FVC-%Pred-Pre Latest Units: % 99  TLC Latest Units: L 5.53  TLC % pred Latest Units: % 100  RV Latest Units: L 2.21  RV % pred Latest Units: % 86  DLCO unc Latest Units: ml/min/mmHg 14.54  DLCO unc % pred Latest Units: % 51  DL/VA Latest Units: ml/min/mmHg/L 3.02  DL/VA % pred Latest Units: % 58    Obstruction present Normal lung volumes Moderately reduced DLCO   Assessment Bronchiectasis flare>> Mild Cannot afford Chest Vest Unable to produce an adequate sputum sample for culture Plan Continue your Mucinex 1200 mg twice Take with a full glass of water. Continue using flutter valve 3-4 times daily ( 3-4 puffs each time) Use albuterol nebs as needed for shortness of breath of wheezing. We will send in a prescription for a  z pack Take 2 tablets today, then one tablet daily until gone Continue Stialto 2 puffs every morning. Rinse mouth after use.  Follow up with Dr. Valeta Harms 4/14 as is scheduled. Call the office sooner if you get worse not better. Call your insurance company as we discussed, and see which anticholinergic inhaler they cover best in regard to cost.    Follow Up  Instructions: Follow up with Dr. Valeta Harms 03/16/2020 as is scheduled   I discussed the assessment and treatment plan with the patient. The patient was provided an opportunity to ask questions and all were answered. The patient agreed with the plan and demonstrated an understanding of the instructions.   The patient was advised to call back or seek an in-person evaluation if the symptoms worsen or if the condition fails to improve as anticipated.  I provided 30 minutes of non-face-to-face time during this encounter.   Magdalen Spatz, NP 02/15/2020 4:30 PM

## 2020-02-15 NOTE — Progress Notes (Signed)
PCCM:  Thanks for seeing her. I appreciate it. Agree. [facility] 02/15/2020 4:47 PM

## 2020-02-15 NOTE — Patient Instructions (Addendum)
It is good to talk with you today. Continue your Mucinex 1200 mg twice Take with a full glass of water. Continue using flutter valve 3-4 times daily ( 3-4 puffs each time) Use albuterol nebs as needed for shortness of breath of wheezing. We will send in a prescription for a z pack Take 2 tablets today, then one tablet daily until gone Continue Stialto 2 puffs every morning. Rinse mouth after use.  Follow up with Dr. Valeta Harms 4/14 as is scheduled. Call the office sooner if you get worse not better. Call your insurance company as we discussed, and see which anticholinergic inhaler they cover best in regard to cost.

## 2020-02-16 NOTE — Telephone Encounter (Signed)
Called [employer] and spoke with rep. Pt has been temporarily approved for Stiolto pt assistance. Pt will receive a 90 day supply of medication along with letter that will state the pt needs to call to apply for low income subsidy to continue getting the Stiolto at no cost. Called pt to make her aware of this and to call [employer] once she receives the letter. Pt also aware to contact our office if she needs any help regarding her new application. Nothing further needed at this time.

## 2020-03-04 DIAGNOSIS — K2970 Gastritis, unspecified, without bleeding: Secondary | ICD-10-CM | POA: Diagnosis not present

## 2020-03-04 DIAGNOSIS — J479 Bronchiectasis, uncomplicated: Secondary | ICD-10-CM | POA: Diagnosis not present

## 2020-03-04 DIAGNOSIS — E782 Mixed hyperlipidemia: Secondary | ICD-10-CM | POA: Diagnosis not present

## 2020-03-04 DIAGNOSIS — J310 Chronic rhinitis: Secondary | ICD-10-CM | POA: Diagnosis not present

## 2020-03-04 DIAGNOSIS — R000 Tachycardia, unspecified: Secondary | ICD-10-CM | POA: Diagnosis not present

## 2020-03-04 DIAGNOSIS — H4010X0 Unspecified open-angle glaucoma, stage unspecified: Secondary | ICD-10-CM | POA: Diagnosis not present

## 2020-03-04 DIAGNOSIS — E039 Hypothyroidism, unspecified: Secondary | ICD-10-CM | POA: Diagnosis not present

## 2020-03-04 DIAGNOSIS — R7301 Impaired fasting glucose: Secondary | ICD-10-CM | POA: Diagnosis not present

## 2020-03-04 DIAGNOSIS — K219 Gastro-esophageal reflux disease without esophagitis: Secondary | ICD-10-CM | POA: Diagnosis not present

## 2020-03-04 DIAGNOSIS — I10 Essential (primary) hypertension: Secondary | ICD-10-CM | POA: Diagnosis not present

## 2020-03-16 ENCOUNTER — Ambulatory Visit: Payer: PPO | Admitting: Pulmonary Disease

## 2020-03-16 ENCOUNTER — Other Ambulatory Visit: Payer: Self-pay

## 2020-03-16 ENCOUNTER — Encounter: Payer: Self-pay | Admitting: Pulmonary Disease

## 2020-03-16 ENCOUNTER — Telehealth: Payer: Self-pay | Admitting: Pulmonary Disease

## 2020-03-16 VITALS — BP 130/82 | HR 86 | Temp 98.3°F | Ht 68.0 in | Wt 156.6 lb

## 2020-03-16 DIAGNOSIS — R058 Other specified cough: Secondary | ICD-10-CM

## 2020-03-16 DIAGNOSIS — R05 Cough: Secondary | ICD-10-CM

## 2020-03-16 DIAGNOSIS — J479 Bronchiectasis, uncomplicated: Secondary | ICD-10-CM

## 2020-03-16 NOTE — Patient Instructions (Signed)
Thank you for visiting Dr. Valeta Harms at [facility]. Today we recommend the following:  Continue samples that you have of stiolto Can go back to advair HFA once you complete samples  Use nebs if needed  Use flutter valve as tolerated  We will work to see if we can find an affordable vest therapy option   Return in about 6 months (around 09/15/2020) for with APP or Dr. Valeta Harms.    Please do your part to reduce the spread of COVID-19.

## 2020-03-16 NOTE — Progress Notes (Signed)
Synopsis: Referred in December 2020 for former patient Dr. Lake Bells, PCP: Celene Squibb, MD  Subjective:   PATIENT ID: Christy Richardson GENDER: female DOB: [birth year], MRN: [id number]  Chief Complaint  Patient presents with  . Follow-up    breathing no changes, clearing throat often, hoarsness    Last seen by Eric Form for bronchiectasis flare.  Followed by Dr. Lake Bells for bronchiectasis and fibrosis.  Patient with recent CT scan of the chest in November with bilateral waxing and waning tree-in-bud nodularity small pulmonary nodules 5 mm in size.  Last seen by Dr. Lake Bells in May 2019.  She had childhood pertussis.  She has been doing pulmonary rehabilitation up until Covid.  Currently managed with flutter valve.  She is also using Advair.  OV 11/11/2019: Here today to establish care with new primary pulmonary provider.  Patient doing well today.  She still does have daily sputum production.  She has been using her flutter valve.  Most recently has not used it daily but if she remembers to she will use it 2-3 times a day.  She has no one at home to help her with postural drainage or CPT.  She was unable to obtain sputum cultures recently.  No fevers chills night sweats weight loss.  No hemoptysis recently.  During her last exacerbation she did have a few streaks of blood.  OV 03/17/2019: Patient with bronchiectasis.  Here with ongoing daily sputum production.  She does feels that the Stiolto is helping however she is unable to qualify for financial assistance and the medication is too expensive for her to continue on a regular basis.  She was also unable to afford vest therapy.  Insurance stated they would cover it however was still on a charge for $130 a month and she could not afford this.  She still deals with daily cough and sputum production.  She does not usually routinely use her nebulizer.  Mainly because she does not like dragging all of the equipment out.  Patient denies fevers night sweats  weight loss.  Patient denies hemoptysis    Past Medical History:  Diagnosis Date  . Adenomatous polyp of colon 03/2007  . Allergic rhinitis   . Allergy    has a lot congestion  . Anxiety   . Blood transfusion 1974  . Bronchiectasis ([place])   . Cancer ([place])    left arm  . Depression   . Diabetes mellitus without complication (HCC)    no meds  . Diastolic dysfunction    a. 01/2014 Echo: EF 60-65%, no rwma, Gr1 DD, Ao sclerosis w/o stenosis. Mild TR.  . Diverticulosis of colon (without mention of hemorrhage)   . DOE (dyspnea on exertion)   . Fatigue   . Fecal incontinence   . GERD (gastroesophageal reflux disease)   . Glaucoma   . Goiter    right side  . Heart murmur   . History of stress test    a. 01/2014 Lexi MV: no ischemia/infarct.  . Hyperlipidemia   . Hypertension    a. 11/2018 Renal artery duplex: No RAS.   Marland Kitchen Hypothyroidism   . IBS (irritable bowel syndrome)   . Migraine   . Mitral valve prolapse    a. 01/2014 Echo: no MR/MVP noted.  . Neck mass    right side/ no airway problems/ per pt has had since 2005 (goiter)  . Nonspecific abnormal electrocardiogram (ECG) (EKG)   . Palpitations   . Palpitations    a.  01/2014 CardioNet: wore for 9 days (rash). Sinus tach - 100's. No SVT/AF.  Marland Kitchen Polyp, sigmoid colon   . Post-operative nausea and vomiting   . Pulmonary nodules   . Recurrent UTI   . Tremor      Family History  Problem Relation Age of Onset  . Diabetes Father   . Throat cancer Mother   . Coronary artery disease Mother   . Prostate cancer Brother   . Asthma Daughter   . Scoliosis Daughter   . Colon cancer Paternal Grandfather   . Esophageal cancer Neg Hx   . Rectal cancer Neg Hx   . Stomach cancer Neg Hx      Past Surgical History:  Procedure Laterality Date  . ABDOMINAL HYSTERECTOMY    . CARDIOVASCULAR STRESS TEST  09/19/2011   No scintigraphic evidence of inducible myocardial ischemia. Pharmacological stress test without chest pain or EKG changes  for ischemia.  . COLONOSCOPY    . ESOPHAGOGASTRODUODENOSCOPY    . MASTOIDECTOMY     rt ear/ as a teenager  . OOPHORECTOMY    . SKIN CANCER EXCISION Left    arm  . TOTAL HIP ARTHROPLASTY  5/10   left  . TRANSTHORACIC ECHOCARDIOGRAM  09/19/2011   EF 123456, stage 1 diastolic dysfunction, mild tricuspid valve regurg    Social History   Socioeconomic History  . Marital status: Widowed    Spouse name: Not on file  . Number of children: 2  . Years of education: 10th grade  . Highest education level: Not on file  Occupational History  . Occupation: Retired    Comment: Recruitment consultant  . Smoking status: Never Smoker  . Smokeless tobacco: Never Used  Substance and Sexual Activity  . Alcohol use: Yes    Comment: rare  . Drug use: No  . Sexual activity: Not on file  Other Topics Concern  . Not on file  Social History Narrative   Widowed since 2002. Lives with her Daughter.   Social Determinants of Health   Financial Resource Strain:   . Difficulty of Paying Living Expenses:   Food Insecurity:   . Worried About Charity fundraiser in the Last Year:   . Arboriculturist in the Last Year:   Transportation Needs:   . Film/video editor (Medical):   Marland Kitchen Lack of Transportation (Non-Medical):   Physical Activity:   . Days of Exercise per Week:   . Minutes of Exercise per Session:   Stress:   . Feeling of Stress :   Social Connections:   . Frequency of Communication with Friends and Family:   . Frequency of Social Gatherings with Friends and Family:   . Attends Religious Services:   . Active Member of Clubs or Organizations:   . Attends Archivist Meetings:   Marland Kitchen Marital Status:   Intimate Partner Violence:   . Fear of Current or Ex-Partner:   . Emotionally Abused:   Marland Kitchen Physically Abused:   . Sexually Abused:      Allergies  Allergen Reactions  . Prednisone     REACTION: Increase eye pressure with gloucoma. Broke out in rash after injection per Dr. Percell Miller    . Adhesive [Tape]     Causes blisters/irritates skin  . Penicillins     REACTION: Rash  . Sulfonamide Derivatives     REACTION: Rash - not sure     Outpatient Medications Prior to Visit  Medication Sig Dispense Refill  .  acetaminophen (TYLENOL) 500 MG tablet Take 500 mg by mouth as needed.    Marland Kitchen albuterol (PROVENTIL) (2.5 MG/3ML) 0.083% nebulizer solution Take 3 mLs (2.5 mg total) by nebulization every 6 (six) hours as needed for wheezing or shortness of breath. 75 mL 12  . brimonidine (ALPHAGAN) 0.2 % ophthalmic solution Place 1 drop into both eyes 2 (two) times daily. Place 1 drop into both eyes 2 (two) times daily.    . carvedilol (COREG) 12.5 MG tablet Take 1 tablet (12.5 mg total) by mouth 2 (two) times daily. 180 tablet 3  . cetirizine (ZYRTEC) 10 MG tablet Take 10 mg by mouth daily.     . cyclobenzaprine (FLEXERIL) 10 MG tablet Take 10 mg by mouth 3 (three) times daily as needed for muscle spasms.    . dorzolamide (TRUSOPT) 2 % ophthalmic solution Place 1 drop into both eyes 2 (two) times daily. Place 1 drop into both eyes 2 (two) times daily.    . hydrALAZINE (APRESOLINE) 25 MG tablet Take 2 tablets (50 mg total) by mouth 3 (three) times daily as needed (Take as needed for systolic blood pressure > 170.). 60 tablet 3  . levothyroxine (SYNTHROID, LEVOTHROID) 50 MCG tablet Take 50 mcg by mouth daily before breakfast.    . LORazepam (ATIVAN) 0.5 MG tablet Take 0.5 mg by mouth 2 (two) times daily as needed (tremors).    . losartan (COZAAR) 100 MG tablet Take 1 tablet (100 mg total) by mouth daily. 90 tablet 3  . Multiple Vitamins-Minerals (MULTIVITAMIN WITH MINERALS) tablet Take 1 tablet by mouth daily.      . pantoprazole (PROTONIX) 40 MG tablet Take 1 tablet (40 mg total) by mouth daily before breakfast. 90 tablet 3  . Polyethyl Glycol-Propyl Glycol (SYSTANE OP) Apply to eye daily.    . pravastatin (PRAVACHOL) 40 MG tablet Take 1 tablet (40 mg total) by mouth at bedtime.    .  sertraline (ZOLOFT) 100 MG tablet Take 1 tablet (100 mg total) by mouth at bedtime. 2 at bedtime    . sodium chloride (OCEAN NASAL SPRAY) 0.65 % nasal spray Place 2 sprays into both nostrils at bedtime. Use as needed    . Tiotropium Bromide-Olodaterol (STIOLTO RESPIMAT) 2.5-2.5 MCG/ACT AERS Inhale 2 puffs into the lungs daily. 4 g 0  . traMADol (ULTRAM) 50 MG tablet Take 50 mg by mouth 2 (two) times daily as needed (pain).     . traZODone (DESYREL) 50 MG tablet Take 50 mg by mouth at bedtime.      . triamcinolone (NASACORT ALLERGY 24HR) 55 MCG/ACT AERO nasal inhaler Place 1 spray into the nose daily.    . fluticasone-salmeterol (ADVAIR HFA) 115-21 MCG/ACT inhaler Inhale 2 puffs into the lungs 2 (two) times daily. (Patient not taking: Reported on 03/16/2020) 1 Inhaler 11  . ZITHROMAX Z-PAK 250 MG tablet Take two tablets today, then one daily until gone 6 each 0   No facility-administered medications prior to visit.    Review of Systems  Constitutional: Negative for chills, fever, malaise/fatigue and weight loss.  HENT: Negative for hearing loss, sore throat and tinnitus.   Eyes: Negative for blurred vision and double vision.  Respiratory: Positive for cough, hemoptysis and sputum production. Negative for shortness of breath, wheezing and stridor.   Cardiovascular: Negative for chest pain, palpitations, orthopnea, leg swelling and PND.  Gastrointestinal: Negative for abdominal pain, constipation, diarrhea, heartburn, nausea and vomiting.  Genitourinary: Negative for dysuria, hematuria and urgency.  Musculoskeletal: Negative for joint  pain and myalgias.  Skin: Negative for itching and rash.  Neurological: Negative for dizziness, tingling, weakness and headaches.  Endo/Heme/Allergies: Negative for environmental allergies. Does not bruise/bleed easily.  Psychiatric/Behavioral: Negative for depression. The patient is not nervous/anxious and does not have insomnia.   All other systems reviewed and  are negative.    Objective:  Physical Exam Vitals reviewed.  Constitutional:      General: She is not in acute distress.    Appearance: She is well-developed.  HENT:     Head: Normocephalic and atraumatic.  Eyes:     General: No scleral icterus.    Conjunctiva/sclera: Conjunctivae normal.     Pupils: Pupils are equal, round, and reactive to light.  Neck:     Vascular: No JVD.     Trachea: No tracheal deviation.  Cardiovascular:     Rate and Rhythm: Normal rate and regular rhythm.     Heart sounds: Normal heart sounds. No murmur.  Pulmonary:     Effort: Pulmonary effort is normal. No tachypnea, accessory muscle usage or respiratory distress.     Breath sounds: Normal breath sounds. No stridor. No wheezing, rhonchi or rales.  Abdominal:     General: Bowel sounds are normal. There is no distension.     Palpations: Abdomen is soft.     Tenderness: There is no abdominal tenderness.  Musculoskeletal:        General: No tenderness.     Cervical back: Neck supple.  Lymphadenopathy:     Cervical: No cervical adenopathy.  Skin:    General: Skin is warm and dry.     Capillary Refill: Capillary refill takes less than 2 seconds.     Findings: No rash.  Neurological:     Mental Status: She is alert and oriented to person, place, and time.  Psychiatric:        Behavior: Behavior normal.      Vitals:   03/16/20 1605  BP: 130/82  Pulse: 86  Temp: 98.3 F (36.8 C)  TempSrc: Temporal  SpO2: 96%  Weight: 156 lb 9.6 oz (71 kg)  Height: 5\' 8"  (1.727 m)   96% on RA BMI Readings from Last 3 Encounters:  03/16/20 23.81 kg/m  11/11/19 24.24 kg/m  10/13/19 23.72 kg/m   Wt Readings from Last 3 Encounters:  03/16/20 156 lb 9.6 oz (71 kg)  11/11/19 159 lb 6.4 oz (72.3 kg)  10/13/19 156 lb (70.8 kg)     CBC    Component Value Date/Time   WBC 5.3 11/06/2018 0754   RBC 4.87 11/06/2018 0754   HGB 14.5 11/06/2018 0754   HGB CANCELED 11/04/2018 1114   HCT 43.8 11/06/2018  0754   HCT CANCELED 11/04/2018 1114   PLT 226 11/06/2018 0754   PLT CANCELED 11/04/2018 1114   MCV 89.9 11/06/2018 0754   MCH 29.8 11/06/2018 0754   MCHC 33.1 11/06/2018 0754   RDW 12.7 11/06/2018 0754   LYMPHSABS 1.1 11/06/2018 0754   LYMPHSABS CANCELED 11/04/2018 1114   MONOABS 0.3 11/06/2018 0754   EOSABS 0.2 11/06/2018 0754   EOSABS CANCELED 11/04/2018 1114   BASOSABS 0.0 11/06/2018 0754   BASOSABS CANCELED 11/04/2018 1114      Chest Imaging: November 2020: CT chest Bronchiectasis scattered tree-in-bud nodularities. The patient's images have been independently reviewed by me.    Pulmonary Functions Testing Results: PFT Results Latest Ref Rng & Units 02/25/2018  FVC-Pre L 3.15  FVC-Predicted Pre % 106  Pre FEV1/FVC % % 61  FEV1-Pre L 1.92  FEV1-Predicted Pre % 86  DLCO UNC% % 51  DLCO COR %Predicted % 58  TLC L 5.53  TLC % Predicted % 100  RV % Predicted % 86    FeNO: None   Pathology: None   Echocardiogram: None   Heart Catheterization: None     Assessment & Plan:     ICD-10-CM   1. Bronchiectasis without complication ([place])  A999333   2. Sputum production  R05     Discussion:  85 year old female recurrent daily sputum production, bronchiectasis.  Plan: Continue use of Stiolto samples. Her insurance will only cover Breo Ellipta. She is going to talk to her pharmacy about cost. I would prefer to keep her off of ICS if possible but if she needs a bronchodilator we may need to use this. If needed she could always go back to her Advair due to cost. I think she needs vest therapy. I will ask my colleagues to see if we can figure out a cheaper way to get vest therapy.  We will also discuss this with our care coordinators..  Case discussed with Dr. Carlis Abbott.  Greater than 50% of this patient's 22-minute office visit was been face-to-face discussing above recommendations and treatment plan.   Current Outpatient Medications:  .  acetaminophen (TYLENOL) 500  MG tablet, Take 500 mg by mouth as needed., Disp: , Rfl:  .  albuterol (PROVENTIL) (2.5 MG/3ML) 0.083% nebulizer solution, Take 3 mLs (2.5 mg total) by nebulization every 6 (six) hours as needed for wheezing or shortness of breath., Disp: 75 mL, Rfl: 12 .  brimonidine (ALPHAGAN) 0.2 % ophthalmic solution, Place 1 drop into both eyes 2 (two) times daily. Place 1 drop into both eyes 2 (two) times daily., Disp: , Rfl:  .  carvedilol (COREG) 12.5 MG tablet, Take 1 tablet (12.5 mg total) by mouth 2 (two) times daily., Disp: 180 tablet, Rfl: 3 .  cetirizine (ZYRTEC) 10 MG tablet, Take 10 mg by mouth daily. , Disp: , Rfl:  .  cyclobenzaprine (FLEXERIL) 10 MG tablet, Take 10 mg by mouth 3 (three) times daily as needed for muscle spasms., Disp: , Rfl:  .  dorzolamide (TRUSOPT) 2 % ophthalmic solution, Place 1 drop into both eyes 2 (two) times daily. Place 1 drop into both eyes 2 (two) times daily., Disp: , Rfl:  .  hydrALAZINE (APRESOLINE) 25 MG tablet, Take 2 tablets (50 mg total) by mouth 3 (three) times daily as needed (Take as needed for systolic blood pressure > 170.)., Disp: 60 tablet, Rfl: 3 .  levothyroxine (SYNTHROID, LEVOTHROID) 50 MCG tablet, Take 50 mcg by mouth daily before breakfast., Disp: , Rfl:  .  LORazepam (ATIVAN) 0.5 MG tablet, Take 0.5 mg by mouth 2 (two) times daily as needed (tremors)., Disp: , Rfl:  .  losartan (COZAAR) 100 MG tablet, Take 1 tablet (100 mg total) by mouth daily., Disp: 90 tablet, Rfl: 3 .  Multiple Vitamins-Minerals (MULTIVITAMIN WITH MINERALS) tablet, Take 1 tablet by mouth daily.  , Disp: , Rfl:  .  pantoprazole (PROTONIX) 40 MG tablet, Take 1 tablet (40 mg total) by mouth daily before breakfast., Disp: 90 tablet, Rfl: 3 .  Polyethyl Glycol-Propyl Glycol (SYSTANE OP), Apply to eye daily., Disp: , Rfl:  .  pravastatin (PRAVACHOL) 40 MG tablet, Take 1 tablet (40 mg total) by mouth at bedtime., Disp: , Rfl:  .  sertraline (ZOLOFT) 100 MG tablet, Take 1 tablet (100 mg  total) by mouth at bedtime.  2 at bedtime, Disp: , Rfl:  .  sodium chloride (OCEAN NASAL SPRAY) 0.65 % nasal spray, Place 2 sprays into both nostrils at bedtime. Use as needed, Disp: , Rfl:  .  Tiotropium Bromide-Olodaterol (STIOLTO RESPIMAT) 2.5-2.5 MCG/ACT AERS, Inhale 2 puffs into the lungs daily., Disp: 4 g, Rfl: 0 .  traMADol (ULTRAM) 50 MG tablet, Take 50 mg by mouth 2 (two) times daily as needed (pain). , Disp: , Rfl:  .  traZODone (DESYREL) 50 MG tablet, Take 50 mg by mouth at bedtime.  , Disp: , Rfl:  .  triamcinolone (NASACORT ALLERGY 24HR) 55 MCG/ACT AERO nasal inhaler, Place 1 spray into the nose daily., Disp: , Rfl:  .  fluticasone-salmeterol (ADVAIR HFA) 115-21 MCG/ACT inhaler, Inhale 2 puffs into the lungs 2 (two) times daily. (Patient not taking: Reported on 03/16/2020), Disp: 1 Inhaler, Rfl: [place], DO [place] [facility] 03/16/2020 4:11 PM

## 2020-03-16 NOTE — Telephone Encounter (Signed)
Patient had order placed 11/2019 for vest therapy with any DME.  Order was placed, but Patient has never received vest. Dr. Valeta Harms requested for [facility] to look into vest therapy for Patient. Order placed 11/11/19 by [place], [place].  Order was sent to [employer].

## 2020-03-17 NOTE — Telephone Encounter (Signed)
Is there a cheaper option available? Leroy Sea

## 2020-03-17 NOTE — Telephone Encounter (Signed)
I heard back from [employer] pt was talked to about the vest on 11/17/19 but declined due to the monthly cost to her Christy Richardson

## 2020-03-17 NOTE — Telephone Encounter (Signed)
Sent priority message to [employer] to see what happened Joellen Jersey

## 2020-03-18 NOTE — Telephone Encounter (Signed)
Thanks BLI   

## 2020-03-18 NOTE — Telephone Encounter (Signed)
I dont know of any vest pt assistance programs sorry Joellen Jersey

## 2020-03-21 ENCOUNTER — Telehealth: Payer: Self-pay | Admitting: Pulmonary Disease

## 2020-03-21 NOTE — Telephone Encounter (Signed)
Spoke with Lenna Sciara  This is just an FYI that the pt's copay for VEST will be waived  The pt will be contacted by [employer]  Will forward to Dr Valeta Harms to make him aware

## 2020-03-22 DIAGNOSIS — H6981 Other specified disorders of Eustachian tube, right ear: Secondary | ICD-10-CM | POA: Diagnosis not present

## 2020-03-22 DIAGNOSIS — H7011 Chronic mastoiditis, right ear: Secondary | ICD-10-CM | POA: Diagnosis not present

## 2020-03-29 DIAGNOSIS — J479 Bronchiectasis, uncomplicated: Secondary | ICD-10-CM | POA: Diagnosis not present

## 2020-04-11 ENCOUNTER — Telehealth: Payer: Self-pay | Admitting: Pulmonary Disease

## 2020-04-11 NOTE — Telephone Encounter (Signed)
Ok thanks For letting me know Garner Nash, DO [place] [facility] 04/11/2020 4:18 PM

## 2020-04-11 NOTE — Progress Notes (Signed)
Cardiology Office Note  Date:  04/12/2020   ID:  Christy Richardson, DOB [birth year], MRN [id number]  PCP:  Christy Squibb, MD   Chief Complaint  Patient presents with  . other    6 month f/u c/o elevated BP. Pt recently started on Amlodipine 10 mg tablet pt c/o edema ankles and BP drops. Meds reviewed verbally with pt.    HPI:  Christy Richardson a 85 y.o.femalewith a history of  labile hypertension,  Anxiety mitral valve prolapse per the patient with no mitral valve regurgitation on echocardiogram 2015 with history of palpitations,  bronchiectasis,  GERD, goirter,  mixed hyperlipidemia,  as well as hypertension with documented grade 1 diastolic dysfunction.  She presents today for routine follow-up of her labile blood pressure and tachycardia  Last clinic visit November 2020 Reported having hypertension, labile at that time High pressure in the morning before her morning pills Blood pressure better after carvedilol down to [id number] systolic,  she continues to wake 4 times overnight, nocturia High fluid intake in the evenings  Today, does not take hydralazine She does report having 1 episode of high pressure in the PM, dinner Took two hydralazine, had possible side effect, buzzing in her head Symptoms seem to have persisted  PMD started norvasc, had ankle swelling Taking amlodipine 2.5 sometimes in the evenings  Takes meds 7:53 AM, and 10 PM Lorazepam recently prescribed by primary care for anxiety  Pain in her neck, chronic issue  Chronic leg weakness Sedentary lifestyle  Lab work reviewed HBa1c 5.5 Total chol 154 , LDL 80  EKG personally reviewed by myself on todays visit Shows normal sinus rhythm rate 74 bpm no significant ST-T wave changes  Echo 07/2019   1. The left ventricle has normal systolic function with an ejection fraction of 60-65%. The cavity size was normal. There is mildly increased left ventricular wall thickness. Left ventricular diastolic Doppler  parameters are consistent with impaired  relaxation.  2. The right ventricle has normal systolic function. The cavity was normal. There is no increase in right ventricular wall thickness. Right ventricular systolic pressure is normal with an estimated pressure of 25.3 mmHg.   Other past medical history reviewed  In October 2012 a nuclear perfusion study revealed normal perfusion  echo Doppler study revealed normal systolic function with grade 1 diastolic dysfunction, mild aortic valve sclerosis without stenosis, trace AR, MR, and mild TR as well as PR. She had upper normal RV pressure 26 mm.  echo Doppler study on 02/02/2014. This showed an ejection fraction of 60-65%. She had grade 1 diastolic dysfunction. There was mild aortic sclerosis without stenosis.    lexiscan perfusion study on 02/04/2014 was normal without scar or ischemia. Post stress ejection fraction was 73%.    CardioNet monitor to assess for palpitations. Unfortunately, she developed significant skin irritation, and only wore this for 9 days. This revealed episodes of sinus rhythm, but she did have episodes of sinus tachycardia with rates in the low 100s. There was there were no episodes of SVT or atrial fibrillation.   PMH:   has a past medical history of Adenomatous polyp of colon (03/2007), Allergic rhinitis, Allergy, Anxiety, Blood transfusion (1974), Bronchiectasis ([place]), Cancer ([place]), Depression, Diabetes mellitus without complication ([place]), Diastolic dysfunction, Diverticulosis of colon (without mention of hemorrhage), DOE (dyspnea on exertion), Fatigue, Fecal incontinence, GERD (gastroesophageal reflux disease), Glaucoma, Goiter, Heart murmur, History of stress test, Hyperlipidemia, Hypertension, Hypothyroidism, IBS (irritable bowel syndrome), Migraine, Mitral valve prolapse, Neck mass, Nonspecific  abnormal electrocardiogram (ECG) (EKG), Palpitations, Palpitations, Polyp, sigmoid colon, Post-operative nausea and  vomiting, Pulmonary nodules, Recurrent UTI, and Tremor.  PSH:    Past Surgical History:  Procedure Laterality Date  . ABDOMINAL HYSTERECTOMY    . CARDIOVASCULAR STRESS TEST  09/19/2011   No scintigraphic evidence of inducible myocardial ischemia. Pharmacological stress test without chest pain or EKG changes for ischemia.  . COLONOSCOPY    . ESOPHAGOGASTRODUODENOSCOPY    . MASTOIDECTOMY     rt ear/ as a teenager  . OOPHORECTOMY    . SKIN CANCER EXCISION Left    arm  . TOTAL HIP ARTHROPLASTY  5/10   left  . TRANSTHORACIC ECHOCARDIOGRAM  09/19/2011   EF [id number], stage 1 diastolic dysfunction, mild tricuspid valve regurg    Current Outpatient Medications  Medication Sig Dispense Refill  . acetaminophen (TYLENOL) 500 MG tablet Take 500 mg by mouth as needed.    Marland Kitchen albuterol (PROVENTIL) (2.5 MG/3ML) 0.083% nebulizer solution Take 3 mLs (2.5 mg total) by nebulization every 6 (six) hours as needed for wheezing or shortness of breath. 75 mL 12  . brimonidine (ALPHAGAN) 0.2 % ophthalmic solution Place 1 drop into both eyes 2 (two) times daily. Place 1 drop into both eyes 2 (two) times daily.    . carvedilol (COREG) 12.5 MG tablet Take 1 tablet (12.5 mg total) by mouth 2 (two) times daily. 180 tablet 3  . cetirizine (ZYRTEC) 10 MG tablet Take 10 mg by mouth daily.     . cyclobenzaprine (FLEXERIL) 10 MG tablet Take 10 mg by mouth 3 (three) times daily as needed for muscle spasms.    . dorzolamide (TRUSOPT) 2 % ophthalmic solution Place 1 drop into both eyes 2 (two) times daily. Place 1 drop into both eyes 2 (two) times daily.    . fluticasone-salmeterol (ADVAIR HFA) 115-21 MCG/ACT inhaler Inhale 2 puffs into the lungs 2 (two) times daily. 1 Inhaler 11  . hydrALAZINE (APRESOLINE) 25 MG tablet Take 2 tablets (50 mg total) by mouth 3 (three) times daily as needed (Take as needed for systolic blood pressure > 170.). 60 tablet 3  . levothyroxine (SYNTHROID, LEVOTHROID) 50 MCG tablet Take 50 mcg by mouth  daily before breakfast.    . LORazepam (ATIVAN) 0.5 MG tablet Take 0.5 mg by mouth 2 (two) times daily as needed (tremors).    . losartan (COZAAR) 100 MG tablet Take 1 tablet (100 mg total) by mouth daily. 90 tablet 3  . Multiple Vitamins-Minerals (MULTIVITAMIN WITH MINERALS) tablet Take 1 tablet by mouth daily.      . pantoprazole (PROTONIX) 40 MG tablet Take 1 tablet (40 mg total) by mouth daily before breakfast. 90 tablet 3  . Polyethyl Glycol-Propyl Glycol (SYSTANE OP) Apply to eye daily.    . pravastatin (PRAVACHOL) 40 MG tablet Take 1 tablet (40 mg total) by mouth at bedtime.    . sertraline (ZOLOFT) 100 MG tablet Take 1 tablet (100 mg total) by mouth at bedtime. 2 at bedtime    . sodium chloride (OCEAN NASAL SPRAY) 0.65 % nasal spray Place 2 sprays into both nostrils at bedtime. Use as needed    . Tiotropium Bromide-Olodaterol (STIOLTO RESPIMAT) 2.5-2.5 MCG/ACT AERS Inhale 2 puffs into the lungs daily. 4 g 0  . traMADol (ULTRAM) 50 MG tablet Take 50 mg by mouth 2 (two) times daily as needed (pain).     . traZODone (DESYREL) 50 MG tablet Take 50 mg by mouth at bedtime.      Marland Kitchen  triamcinolone (NASACORT ALLERGY 24HR) 55 MCG/ACT AERO nasal inhaler Place 1 spray into the nose daily.    Marland Kitchen amLODipine (NORVASC) 10 MG tablet Pt has reduced to 1/4-1/2 tablet qd.     No current facility-administered medications for this visit.     Allergies:   Prednisone, Adhesive [tape], Penicillins, and Sulfonamide derivatives   Social History:  The patient  reports that she has never smoked. She has never used smokeless tobacco. She reports current alcohol use. She reports that she does not use drugs.   Family History:   family history includes Asthma in her daughter; Colon cancer in her paternal grandfather; Coronary artery disease in her mother; Diabetes in her father; Prostate cancer in her brother; Scoliosis in her daughter; Throat cancer in her mother.    Review of Systems: Review of Systems   Constitutional: Negative.   HENT: Negative.   Respiratory: Negative.   Cardiovascular: Negative.   Gastrointestinal: Negative.   Musculoskeletal: Negative.   Neurological: Negative.   Psychiatric/Behavioral: Negative.   All other systems reviewed and are negative.   PHYSICAL EXAM: VS:  BP 138/70 (BP Location: Left Arm, Patient Position: Sitting, Cuff Size: Normal)   Pulse 74   Ht 5\' 8"  (1.727 m)   Wt 159 lb (72.1 kg)   SpO2 98%   BMI 24.18 kg/m  , BMI Body mass index is 24.18 kg/m. Constitutional:  oriented to person, place, and time. No distress.  HENT:  Head: Grossly normal Eyes:  no discharge. No scleral icterus.  Neck: No JVD, no carotid bruits  Cardiovascular: Regular rate and rhythm, no murmurs appreciated Pulmonary/Chest: Clear to auscultation bilaterally, no wheezes or rails Abdominal: Soft.  no distension.  no tenderness.  Musculoskeletal: Normal range of motion Neurological:  normal muscle tone. Coordination normal. No atrophy Skin: Skin warm and dry Psychiatric: normal affect, pleasant  Recent Labs: No results found for requested labs within last 8760 hours.    Lipid Panel Lab Results  Component Value Date   CHOL 201 (H) 03/01/2009   HDL 47 03/01/2009   LDLCALC 116 (H) 03/01/2009   TRIG 189 (H) 03/01/2009      Wt Readings from Last 3 Encounters:  04/12/20 159 lb (72.1 kg)  03/16/20 156 lb 9.6 oz (71 kg)  11/11/19 159 lb 6.4 oz (72.3 kg)     ASSESSMENT AND PLAN:  Hyperlipidemia LDL goal <70 - Plan: EKG 12-Lead Cholesterol is at goal on the current lipid regimen. No changes to the medications were made.  Stable  Essential hypertension - Plan: EKG 12-Lead Blood pressure is well controlled on today's visit. No changes made to the medications. Recommend she take extra half dose carvedilol in the evening for high pressures in the morning when she first wakes up -She could also take hydralazine or low-dose amlodipine in the  evening  Bronchiectasis She attributes much of her shortness of breath to her pulmonary disease Followed by pulmonary Recommend she start a walking program  Anxiety Reassurance provided Lorazepam provided by primary care   Total encounter time more than 25 minutes  Greater than 50% was spent in counseling and coordination of care with the patient  Disposition:   F/U  12 months   Orders Placed This Encounter  Procedures  . EKG 12-Lead     Signed, Esmond Plants, M.D., Ph.D. 04/12/2020  [place], [place]

## 2020-04-11 NOTE — Telephone Encounter (Signed)
FYI will let Dr. Valeta Harms know DME will not waive copay for VEST  Dr. Valeta Harms any further recs:   Aliciana Ricciardi NP-C

## 2020-04-12 ENCOUNTER — Ambulatory Visit: Payer: PPO | Admitting: Cardiovascular Disease

## 2020-04-12 ENCOUNTER — Other Ambulatory Visit: Payer: Self-pay

## 2020-04-12 VITALS — BP 138/70 | HR 74 | Ht 68.0 in | Wt 159.0 lb

## 2020-04-12 DIAGNOSIS — R0989 Other specified symptoms and signs involving the circulatory and respiratory systems: Secondary | ICD-10-CM

## 2020-04-12 DIAGNOSIS — E782 Mixed hyperlipidemia: Secondary | ICD-10-CM | POA: Diagnosis not present

## 2020-04-12 DIAGNOSIS — R000 Tachycardia, unspecified: Secondary | ICD-10-CM | POA: Diagnosis not present

## 2020-04-12 DIAGNOSIS — R0600 Dyspnea, unspecified: Secondary | ICD-10-CM | POA: Diagnosis not present

## 2020-04-12 DIAGNOSIS — R0602 Shortness of breath: Secondary | ICD-10-CM | POA: Diagnosis not present

## 2020-04-12 NOTE — Patient Instructions (Addendum)
Medication Instructions:  No changes  Take extra coreg 1/2 pill as needed at night Amlodipine 2.5 daily PRN for high pressure  Hydralazine as needed   If you need a refill on your cardiac medications before your next appointment, please call your pharmacy.    Lab work: No new labs needed   If you have labs (blood work) drawn today and your tests are completely normal, you will receive your results only by: Marland Kitchen MyChart Message (if you have MyChart) OR . A paper copy in the mail If you have any lab test that is abnormal or we need to change your treatment, we will call you to review the results.   Testing/Procedures: No new testing needed   Follow-Up: At [facility], you and your health needs are our priority.  As part of our continuing mission to provide you with exceptional heart care, we have created designated Provider Care Teams.  These Care Teams include your primary Cardiologist (physician) and Advanced Practice Providers (APPs -  Physician Assistants and Nurse Practitioners) who all work together to provide you with the care you need, when you need it.  . You will need a follow up appointment in 12 months   . Providers on your designated Care Team:   . Murray Hodgkins, NP . Christell Faith, PA-C . Marrianne Mood, PA-C  Any Other Special Instructions Will Be Listed Below (If Applicable).  For educational health videos Log in to : [email or web address] Or : [email or web address], password : [email or web address]

## 2020-04-18 ENCOUNTER — Other Ambulatory Visit: Payer: Self-pay | Admitting: Cardiovascular Disease

## 2020-04-18 MED ORDER — CARVEDILOL 12.5 MG PO TABS: 12.5000 mg | ORAL_TABLET | Freq: Two times a day (BID) | ORAL | 3 refills | Status: DC

## 2020-04-18 NOTE — Telephone Encounter (Signed)
Requested Prescriptions   Signed Prescriptions Disp Refills   carvedilol (COREG) 12.5 MG tablet 180 tablet 3    Sig: Take 1 tablet (12.5 mg total) by mouth 2 (two) times daily.    Authorizing Provider: Minna Merritts    Ordering User: Raelene Bott, Edd Reppert L

## 2020-04-18 NOTE — Telephone Encounter (Signed)
*  STAT* If patient is at the pharmacy, call can be transferred to refill team.   1. Which medications need to be refilled? (please list name of each medication and dose if known) carvedilol 12.5 MG 2.5 times daily  2. Which pharmacy/location (including street and city if local pharmacy) is medication to be sent to? [place]   3. Do they need a 30 day or 90 day supply? 90 day

## 2020-04-21 DIAGNOSIS — H401131 Primary open-angle glaucoma, bilateral, mild stage: Secondary | ICD-10-CM | POA: Diagnosis not present

## 2020-04-22 DIAGNOSIS — E039 Hypothyroidism, unspecified: Secondary | ICD-10-CM | POA: Diagnosis not present

## 2020-04-22 DIAGNOSIS — K2970 Gastritis, unspecified, without bleeding: Secondary | ICD-10-CM | POA: Diagnosis not present

## 2020-04-22 DIAGNOSIS — J479 Bronchiectasis, uncomplicated: Secondary | ICD-10-CM | POA: Diagnosis not present

## 2020-04-22 DIAGNOSIS — E782 Mixed hyperlipidemia: Secondary | ICD-10-CM | POA: Diagnosis not present

## 2020-04-22 DIAGNOSIS — I10 Essential (primary) hypertension: Secondary | ICD-10-CM | POA: Diagnosis not present

## 2020-04-22 DIAGNOSIS — R7301 Impaired fasting glucose: Secondary | ICD-10-CM | POA: Diagnosis not present

## 2020-04-22 DIAGNOSIS — R000 Tachycardia, unspecified: Secondary | ICD-10-CM | POA: Diagnosis not present

## 2020-04-22 DIAGNOSIS — K219 Gastro-esophageal reflux disease without esophagitis: Secondary | ICD-10-CM | POA: Diagnosis not present

## 2020-04-22 DIAGNOSIS — H4010X0 Unspecified open-angle glaucoma, stage unspecified: Secondary | ICD-10-CM | POA: Diagnosis not present

## 2020-04-22 DIAGNOSIS — J310 Chronic rhinitis: Secondary | ICD-10-CM | POA: Diagnosis not present

## 2020-04-28 DIAGNOSIS — J479 Bronchiectasis, uncomplicated: Secondary | ICD-10-CM | POA: Diagnosis not present

## 2020-05-04 DIAGNOSIS — D171 Benign lipomatous neoplasm of skin and subcutaneous tissue of trunk: Secondary | ICD-10-CM | POA: Diagnosis not present

## 2020-05-04 DIAGNOSIS — H6691 Otitis media, unspecified, right ear: Secondary | ICD-10-CM | POA: Diagnosis not present

## 2020-05-04 DIAGNOSIS — E875 Hyperkalemia: Secondary | ICD-10-CM | POA: Diagnosis not present

## 2020-05-04 DIAGNOSIS — J310 Chronic rhinitis: Secondary | ICD-10-CM | POA: Diagnosis not present

## 2020-05-04 DIAGNOSIS — E7849 Other hyperlipidemia: Secondary | ICD-10-CM | POA: Diagnosis not present

## 2020-05-04 DIAGNOSIS — H4010X0 Unspecified open-angle glaucoma, stage unspecified: Secondary | ICD-10-CM | POA: Diagnosis not present

## 2020-05-04 DIAGNOSIS — J060 Acute laryngopharyngitis: Secondary | ICD-10-CM | POA: Diagnosis not present

## 2020-05-04 DIAGNOSIS — C4490 Unspecified malignant neoplasm of skin, unspecified: Secondary | ICD-10-CM | POA: Diagnosis not present

## 2020-05-04 DIAGNOSIS — F411 Generalized anxiety disorder: Secondary | ICD-10-CM | POA: Diagnosis not present

## 2020-05-04 DIAGNOSIS — E039 Hypothyroidism, unspecified: Secondary | ICD-10-CM | POA: Diagnosis not present

## 2020-05-04 DIAGNOSIS — E782 Mixed hyperlipidemia: Secondary | ICD-10-CM | POA: Diagnosis not present

## 2020-05-04 DIAGNOSIS — I10 Essential (primary) hypertension: Secondary | ICD-10-CM | POA: Diagnosis not present

## 2020-05-09 DIAGNOSIS — H4010X0 Unspecified open-angle glaucoma, stage unspecified: Secondary | ICD-10-CM | POA: Diagnosis not present

## 2020-05-09 DIAGNOSIS — I10 Essential (primary) hypertension: Secondary | ICD-10-CM | POA: Diagnosis not present

## 2020-05-09 DIAGNOSIS — N1831 Chronic kidney disease, stage 3a: Secondary | ICD-10-CM | POA: Diagnosis not present

## 2020-05-09 DIAGNOSIS — E039 Hypothyroidism, unspecified: Secondary | ICD-10-CM | POA: Diagnosis not present

## 2020-05-09 DIAGNOSIS — E875 Hyperkalemia: Secondary | ICD-10-CM | POA: Diagnosis not present

## 2020-05-09 DIAGNOSIS — M542 Cervicalgia: Secondary | ICD-10-CM | POA: Diagnosis not present

## 2020-05-09 DIAGNOSIS — Z0001 Encounter for general adult medical examination with abnormal findings: Secondary | ICD-10-CM | POA: Diagnosis not present

## 2020-05-09 DIAGNOSIS — J479 Bronchiectasis, uncomplicated: Secondary | ICD-10-CM | POA: Diagnosis not present

## 2020-05-09 DIAGNOSIS — R7301 Impaired fasting glucose: Secondary | ICD-10-CM | POA: Diagnosis not present

## 2020-05-09 DIAGNOSIS — E782 Mixed hyperlipidemia: Secondary | ICD-10-CM | POA: Diagnosis not present

## 2020-05-29 DIAGNOSIS — J479 Bronchiectasis, uncomplicated: Secondary | ICD-10-CM | POA: Diagnosis not present

## 2020-06-07 DIAGNOSIS — E875 Hyperkalemia: Secondary | ICD-10-CM | POA: Diagnosis not present

## 2020-06-07 DIAGNOSIS — Z0001 Encounter for general adult medical examination with abnormal findings: Secondary | ICD-10-CM | POA: Diagnosis not present

## 2020-06-07 DIAGNOSIS — H4010X0 Unspecified open-angle glaucoma, stage unspecified: Secondary | ICD-10-CM | POA: Diagnosis not present

## 2020-06-07 DIAGNOSIS — E7849 Other hyperlipidemia: Secondary | ICD-10-CM | POA: Diagnosis not present

## 2020-06-07 DIAGNOSIS — M542 Cervicalgia: Secondary | ICD-10-CM | POA: Diagnosis not present

## 2020-06-07 DIAGNOSIS — I10 Essential (primary) hypertension: Secondary | ICD-10-CM | POA: Diagnosis not present

## 2020-06-07 DIAGNOSIS — J479 Bronchiectasis, uncomplicated: Secondary | ICD-10-CM | POA: Diagnosis not present

## 2020-06-07 DIAGNOSIS — E782 Mixed hyperlipidemia: Secondary | ICD-10-CM | POA: Diagnosis not present

## 2020-06-07 DIAGNOSIS — R7301 Impaired fasting glucose: Secondary | ICD-10-CM | POA: Diagnosis not present

## 2020-06-07 DIAGNOSIS — I129 Hypertensive chronic kidney disease with stage 1 through stage 4 chronic kidney disease, or unspecified chronic kidney disease: Secondary | ICD-10-CM | POA: Diagnosis not present

## 2020-06-07 DIAGNOSIS — N1831 Chronic kidney disease, stage 3a: Secondary | ICD-10-CM | POA: Diagnosis not present

## 2020-06-07 DIAGNOSIS — E039 Hypothyroidism, unspecified: Secondary | ICD-10-CM | POA: Diagnosis not present

## 2020-06-09 ENCOUNTER — Telehealth: Payer: Self-pay | Admitting: Cardiovascular Disease

## 2020-06-09 NOTE — Telephone Encounter (Signed)
Pt c/o BP issue: STAT if pt c/o blurred vision, one-sided weakness or slurred speech  1. What are your last 5 BP readings?  7/2 162/83  2. Are you having any other symptoms (ex. Dizziness, headache, blurred vision, passed out)? no  3. What is your BP issue? reoccurring high bp per PCP

## 2020-06-09 NOTE — Telephone Encounter (Signed)
Spoke with patient and she states that blood pressures have been an issue for a long time. She did take medications and additional doses as directed. 113/60, 97/52 HR 93 & 86/52 and pulse was high, she denied any symptoms with no dizziness. She stated that it is under control with taking additional doses as directed by Dr. Rockey Situ. Advised to please call if her pressures should remain elevated. She verbalized understanding of our conversation, agreement with plan, and had no further questions at this time.

## 2020-06-28 DIAGNOSIS — J479 Bronchiectasis, uncomplicated: Secondary | ICD-10-CM | POA: Diagnosis not present

## 2020-07-05 ENCOUNTER — Other Ambulatory Visit: Payer: Self-pay

## 2020-07-05 ENCOUNTER — Telehealth: Payer: Self-pay | Admitting: Cardiovascular Disease

## 2020-07-05 MED ORDER — AMLODIPINE BESYLATE 10 MG PO TABS: 5.0000 mg | ORAL_TABLET | Freq: Every day | ORAL | 0 refills | Status: DC | PRN

## 2020-07-05 NOTE — Telephone Encounter (Signed)
*  STAT* If patient is at the pharmacy, call can be transferred to refill team.   1. Which medications need to be refilled? (please list name of each medication and dose if known) amlodipine 10 mg 1/2 tablet   2. Which pharmacy/location (including street and city if local pharmacy) is medication to be sent to? [facility] in [place]   3. Do they need a 30 day or 90 day supply? 90 day

## 2020-07-05 NOTE — Telephone Encounter (Signed)
amLODipine (NORVASC) 10 MG tablet 90 tablet 0 07/05/2020    Sig - Route: Take 0.5 tablets (5 mg total) by mouth daily as needed. - Oral   Sent to pharmacy as: amLODipine (NORVASC) 10 MG tablet   E-Prescribing Status: Sent to pharmacy (07/05/2020  2:37 PM EDT)   Pharmacy  [place], [place]

## 2020-07-21 DIAGNOSIS — I129 Hypertensive chronic kidney disease with stage 1 through stage 4 chronic kidney disease, or unspecified chronic kidney disease: Secondary | ICD-10-CM | POA: Diagnosis not present

## 2020-07-21 DIAGNOSIS — R7301 Impaired fasting glucose: Secondary | ICD-10-CM | POA: Diagnosis not present

## 2020-07-21 DIAGNOSIS — J479 Bronchiectasis, uncomplicated: Secondary | ICD-10-CM | POA: Diagnosis not present

## 2020-07-21 DIAGNOSIS — E039 Hypothyroidism, unspecified: Secondary | ICD-10-CM | POA: Diagnosis not present

## 2020-07-21 DIAGNOSIS — M542 Cervicalgia: Secondary | ICD-10-CM | POA: Diagnosis not present

## 2020-07-21 DIAGNOSIS — I10 Essential (primary) hypertension: Secondary | ICD-10-CM | POA: Diagnosis not present

## 2020-07-21 DIAGNOSIS — E782 Mixed hyperlipidemia: Secondary | ICD-10-CM | POA: Diagnosis not present

## 2020-07-21 DIAGNOSIS — E875 Hyperkalemia: Secondary | ICD-10-CM | POA: Diagnosis not present

## 2020-07-21 DIAGNOSIS — Z0001 Encounter for general adult medical examination with abnormal findings: Secondary | ICD-10-CM | POA: Diagnosis not present

## 2020-07-21 DIAGNOSIS — H4010X0 Unspecified open-angle glaucoma, stage unspecified: Secondary | ICD-10-CM | POA: Diagnosis not present

## 2020-07-21 DIAGNOSIS — E7849 Other hyperlipidemia: Secondary | ICD-10-CM | POA: Diagnosis not present

## 2020-07-21 DIAGNOSIS — N1831 Chronic kidney disease, stage 3a: Secondary | ICD-10-CM | POA: Diagnosis not present

## 2020-07-22 IMAGING — MG DIGITAL DIAGNOSTIC BILATERAL MAMMOGRAM WITH TOMO AND CAD
6 of 10 series · 6 of 30 positions shown · non-contrast
Comparison: Previous exam(s).

CLINICAL DATA: Possible left nipple retraction.

EXAM:
DIGITAL DIAGNOSTIC BILATERAL MAMMOGRAM WITH CAD AND TOMO
ULTRASOUND LEFT BREAST

[R CC synth-2D]
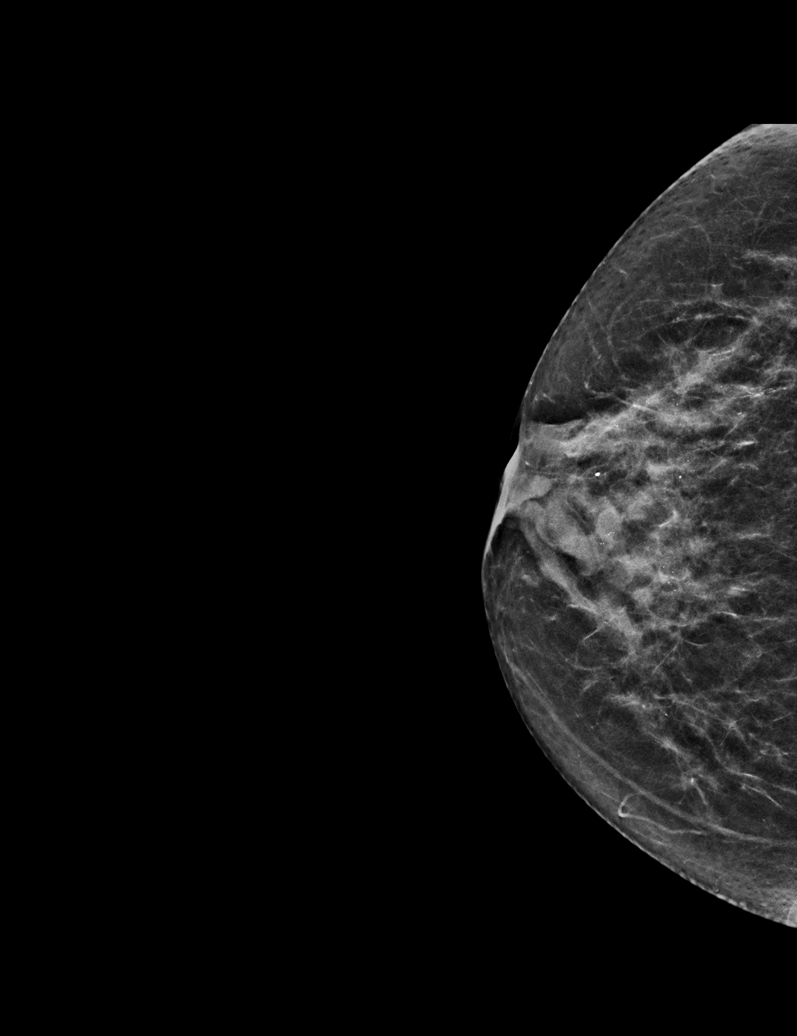

[L CC synth-2D (1 of 2)]
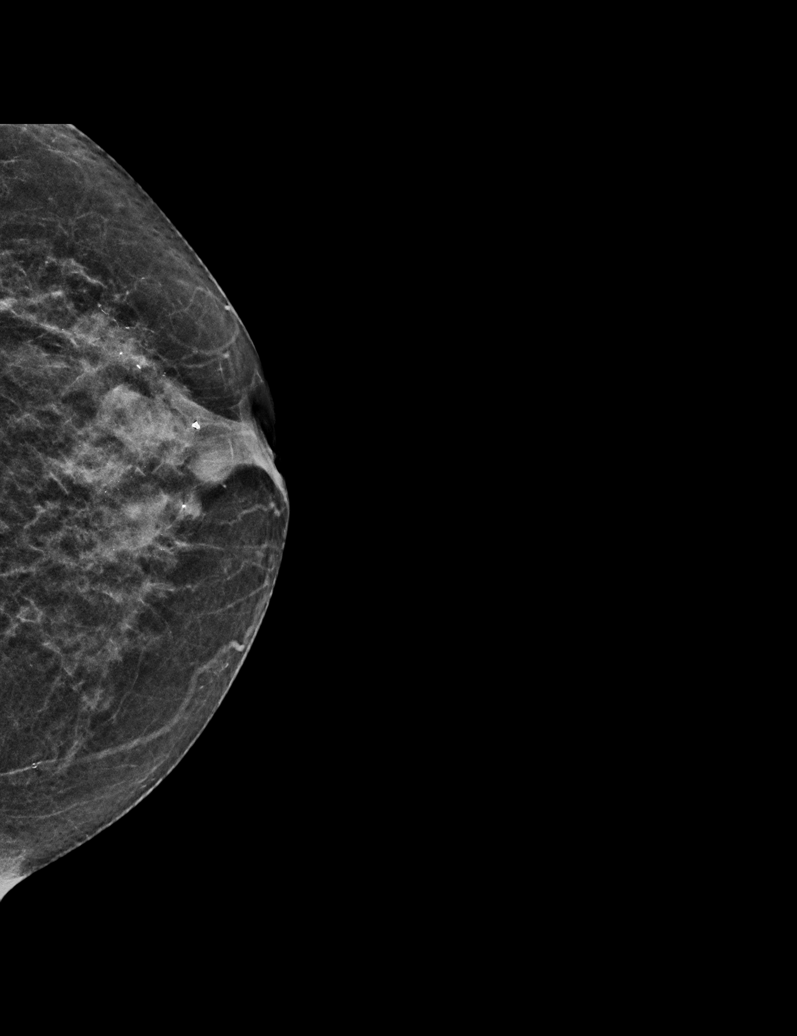

[L CC synth-2D (2 of 2)]
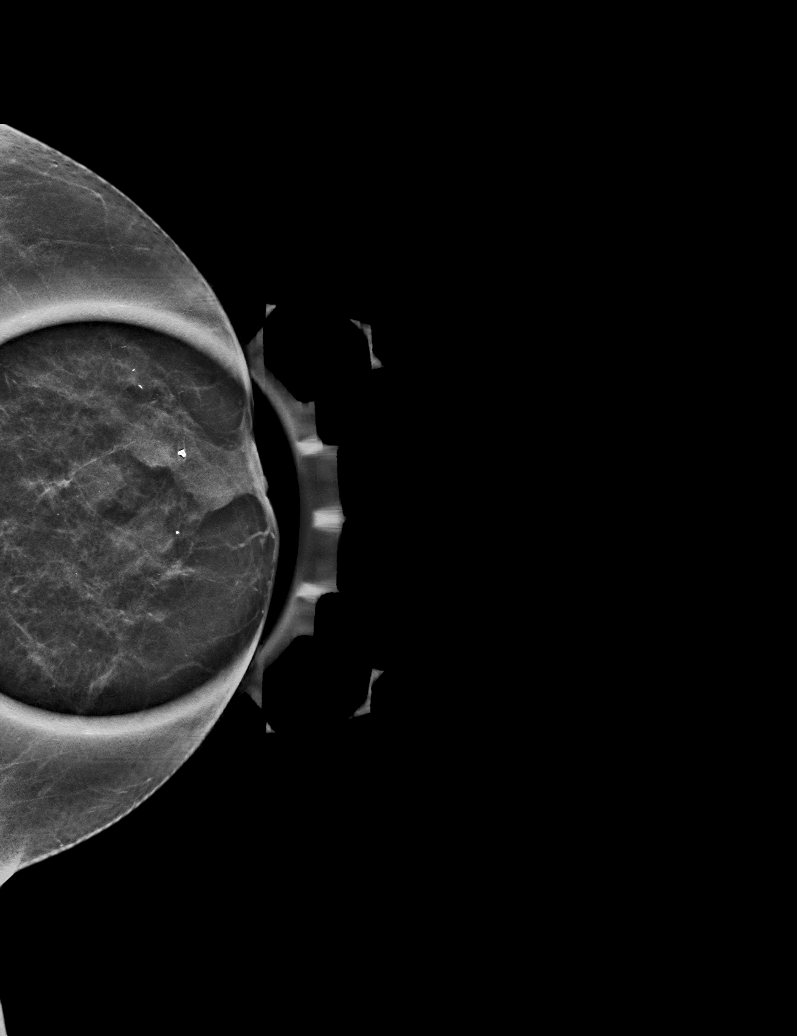

[R MLO synth-2D]
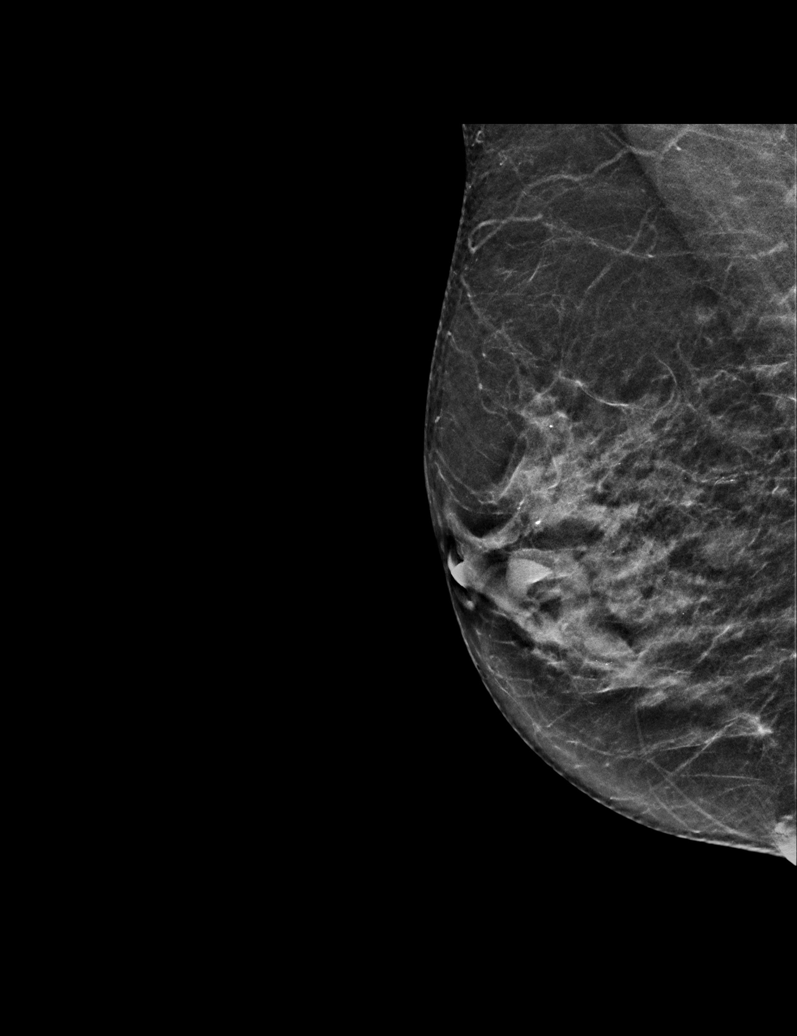

[L MLO synth-2D]
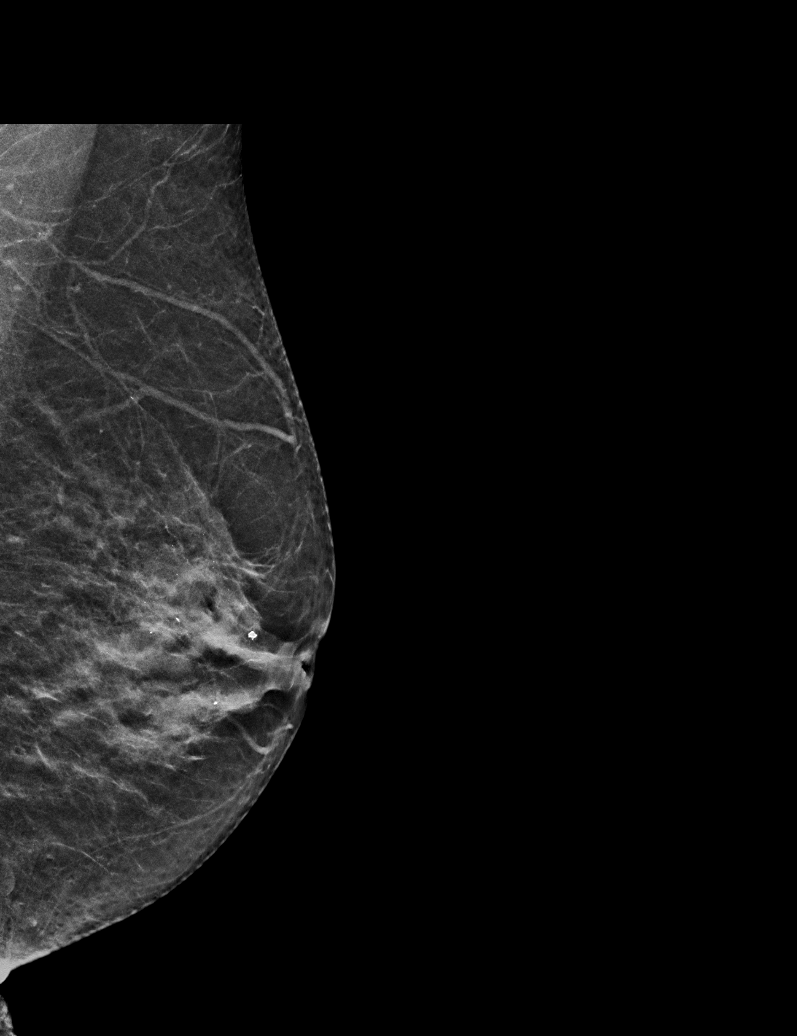

[L MLO tomo · tomo slice 25/48.0]
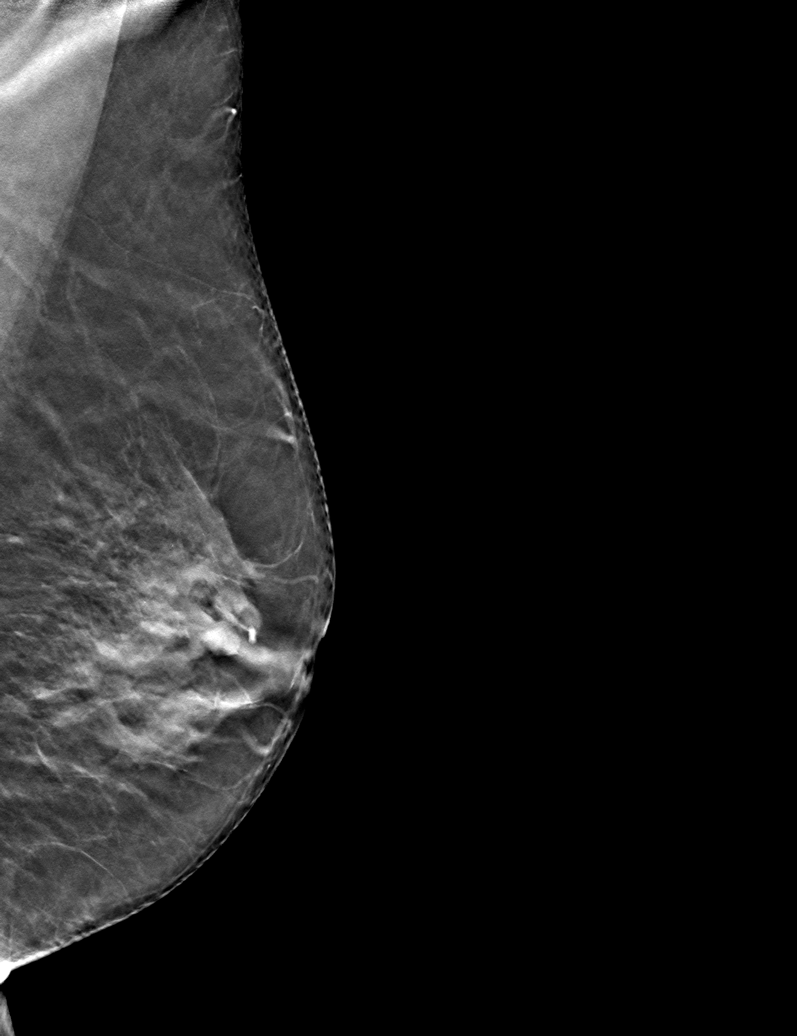

[6 of 30 positions shown; findings below may reference images not displayed]

ACR Breast Density Category c: The breast tissue is heterogeneously
dense, which may obscure small masses.
FINDINGS: No suspicious mass, malignant type microcalcifications or distortion
detected in either breast. Parenchymal pattern looks stable compared
to prior exams.

Mammographic images were processed with CAD.

On physical exam, I do not palpate a mass in the subareolar region
of the left breast. The nipple is slightly retracted but patient
states that it is clinically stable.

Targeted ultrasound is performed, showing dilated ducts in the
retroareolar region of the left breast. No intraductal mass is
identified. No solid mass, abnormal shadowing or distortion
visualized.
IMPRESSION: No evidence of malignancy in either breast.

RECOMMENDATION:
Bilateral screening mammogram in 1 year is recommended.

I have discussed the findings and recommendations with the patient.
Results were also provided in writing at the conclusion of the
visit. If applicable, a reminder letter will be sent to the patient
regarding the next appointment.

BI-RADS CATEGORY  1: Negative.

## 2020-07-29 DIAGNOSIS — J479 Bronchiectasis, uncomplicated: Secondary | ICD-10-CM | POA: Diagnosis not present

## 2020-08-19 DIAGNOSIS — M542 Cervicalgia: Secondary | ICD-10-CM | POA: Diagnosis not present

## 2020-08-19 DIAGNOSIS — E782 Mixed hyperlipidemia: Secondary | ICD-10-CM | POA: Diagnosis not present

## 2020-08-19 DIAGNOSIS — Z0001 Encounter for general adult medical examination with abnormal findings: Secondary | ICD-10-CM | POA: Diagnosis not present

## 2020-08-19 DIAGNOSIS — R7301 Impaired fasting glucose: Secondary | ICD-10-CM | POA: Diagnosis not present

## 2020-08-19 DIAGNOSIS — E875 Hyperkalemia: Secondary | ICD-10-CM | POA: Diagnosis not present

## 2020-08-19 DIAGNOSIS — H4010X0 Unspecified open-angle glaucoma, stage unspecified: Secondary | ICD-10-CM | POA: Diagnosis not present

## 2020-08-19 DIAGNOSIS — E039 Hypothyroidism, unspecified: Secondary | ICD-10-CM | POA: Diagnosis not present

## 2020-08-19 DIAGNOSIS — I129 Hypertensive chronic kidney disease with stage 1 through stage 4 chronic kidney disease, or unspecified chronic kidney disease: Secondary | ICD-10-CM | POA: Diagnosis not present

## 2020-08-19 DIAGNOSIS — J479 Bronchiectasis, uncomplicated: Secondary | ICD-10-CM | POA: Diagnosis not present

## 2020-08-19 DIAGNOSIS — I10 Essential (primary) hypertension: Secondary | ICD-10-CM | POA: Diagnosis not present

## 2020-08-19 DIAGNOSIS — E7849 Other hyperlipidemia: Secondary | ICD-10-CM | POA: Diagnosis not present

## 2020-08-19 DIAGNOSIS — N1831 Chronic kidney disease, stage 3a: Secondary | ICD-10-CM | POA: Diagnosis not present

## 2020-08-29 DIAGNOSIS — J479 Bronchiectasis, uncomplicated: Secondary | ICD-10-CM | POA: Diagnosis not present

## 2020-09-08 ENCOUNTER — Other Ambulatory Visit: Payer: Self-pay | Admitting: Cardiovascular Disease

## 2020-09-20 DIAGNOSIS — H6980 Other specified disorders of Eustachian tube, unspecified ear: Secondary | ICD-10-CM | POA: Diagnosis not present

## 2020-09-20 DIAGNOSIS — H7011 Chronic mastoiditis, right ear: Secondary | ICD-10-CM | POA: Diagnosis not present

## 2020-09-20 DIAGNOSIS — H903 Sensorineural hearing loss, bilateral: Secondary | ICD-10-CM | POA: Diagnosis not present

## 2020-09-21 DIAGNOSIS — E7849 Other hyperlipidemia: Secondary | ICD-10-CM | POA: Diagnosis not present

## 2020-09-21 DIAGNOSIS — J479 Bronchiectasis, uncomplicated: Secondary | ICD-10-CM | POA: Diagnosis not present

## 2020-09-21 DIAGNOSIS — H4010X0 Unspecified open-angle glaucoma, stage unspecified: Secondary | ICD-10-CM | POA: Diagnosis not present

## 2020-09-21 DIAGNOSIS — E875 Hyperkalemia: Secondary | ICD-10-CM | POA: Diagnosis not present

## 2020-09-21 DIAGNOSIS — I10 Essential (primary) hypertension: Secondary | ICD-10-CM | POA: Diagnosis not present

## 2020-09-21 DIAGNOSIS — M542 Cervicalgia: Secondary | ICD-10-CM | POA: Diagnosis not present

## 2020-09-21 DIAGNOSIS — I129 Hypertensive chronic kidney disease with stage 1 through stage 4 chronic kidney disease, or unspecified chronic kidney disease: Secondary | ICD-10-CM | POA: Diagnosis not present

## 2020-09-21 DIAGNOSIS — E782 Mixed hyperlipidemia: Secondary | ICD-10-CM | POA: Diagnosis not present

## 2020-09-21 DIAGNOSIS — N1831 Chronic kidney disease, stage 3a: Secondary | ICD-10-CM | POA: Diagnosis not present

## 2020-09-21 DIAGNOSIS — E039 Hypothyroidism, unspecified: Secondary | ICD-10-CM | POA: Diagnosis not present

## 2020-09-21 DIAGNOSIS — Z0001 Encounter for general adult medical examination with abnormal findings: Secondary | ICD-10-CM | POA: Diagnosis not present

## 2020-09-21 DIAGNOSIS — R7301 Impaired fasting glucose: Secondary | ICD-10-CM | POA: Diagnosis not present

## 2020-09-28 DIAGNOSIS — J479 Bronchiectasis, uncomplicated: Secondary | ICD-10-CM | POA: Diagnosis not present

## 2020-10-03 ENCOUNTER — Ambulatory Visit: Payer: PPO | Admitting: Pulmonary Disease

## 2020-10-03 ENCOUNTER — Encounter: Payer: Self-pay | Admitting: Pulmonary Disease

## 2020-10-03 ENCOUNTER — Other Ambulatory Visit: Payer: Self-pay

## 2020-10-03 VITALS — BP 130/72 | HR 81 | Temp 97.6°F | Ht 68.0 in | Wt 156.8 lb

## 2020-10-03 DIAGNOSIS — K2970 Gastritis, unspecified, without bleeding: Secondary | ICD-10-CM | POA: Diagnosis not present

## 2020-10-03 DIAGNOSIS — I10 Essential (primary) hypertension: Secondary | ICD-10-CM | POA: Diagnosis not present

## 2020-10-03 DIAGNOSIS — J479 Bronchiectasis, uncomplicated: Secondary | ICD-10-CM | POA: Diagnosis not present

## 2020-10-03 DIAGNOSIS — R0600 Dyspnea, unspecified: Secondary | ICD-10-CM | POA: Diagnosis not present

## 2020-10-03 DIAGNOSIS — R7301 Impaired fasting glucose: Secondary | ICD-10-CM | POA: Diagnosis not present

## 2020-10-03 DIAGNOSIS — H4010X0 Unspecified open-angle glaucoma, stage unspecified: Secondary | ICD-10-CM | POA: Diagnosis not present

## 2020-10-03 DIAGNOSIS — E039 Hypothyroidism, unspecified: Secondary | ICD-10-CM | POA: Diagnosis not present

## 2020-10-03 DIAGNOSIS — R000 Tachycardia, unspecified: Secondary | ICD-10-CM | POA: Diagnosis not present

## 2020-10-03 DIAGNOSIS — K219 Gastro-esophageal reflux disease without esophagitis: Secondary | ICD-10-CM | POA: Diagnosis not present

## 2020-10-03 DIAGNOSIS — J310 Chronic rhinitis: Secondary | ICD-10-CM | POA: Diagnosis not present

## 2020-10-03 DIAGNOSIS — R058 Other specified cough: Secondary | ICD-10-CM

## 2020-10-03 DIAGNOSIS — E782 Mixed hyperlipidemia: Secondary | ICD-10-CM | POA: Diagnosis not present

## 2020-10-03 DIAGNOSIS — R0609 Other forms of dyspnea: Secondary | ICD-10-CM

## 2020-10-03 MED ORDER — STIOLTO RESPIMAT 2.5-2.5 MCG/ACT IN AERS: 2.0000 | INHALATION_SPRAY | Freq: Every day | RESPIRATORY_TRACT | 0 refills | Status: DC

## 2020-10-03 MED ORDER — STIOLTO RESPIMAT 2.5-2.5 MCG/ACT IN AERS: 2.0000 | INHALATION_SPRAY | Freq: Every day | RESPIRATORY_TRACT | 11 refills | Status: DC

## 2020-10-03 NOTE — Progress Notes (Signed)
Synopsis: Referred in December 2020 for former patient Dr. Lake Bells, PCP: Celene Squibb, MD  Subjective:   PATIENT ID: Christy Richardson GENDER: female DOB: 01-19-1935, MRN: [id number]  Chief Complaint  Patient presents with  . Follow-up    hacky cough states she has some rattling but can't get up mucus and when she does get it up its green, shortness of breath with exertion. Feels like the stiolto has really helped her breathing as well as her vest    Last seen by Eric Form for bronchiectasis flare.  Followed by Dr. Lake Bells for bronchiectasis and fibrosis.  Patient with recent CT scan of the chest in November with bilateral waxing and waning tree-in-bud nodularity small pulmonary nodules 5 mm in size.  Last seen by Dr. Lake Bells in May 2019.  She had childhood pertussis.  She has been doing pulmonary rehabilitation up until Covid.  Currently managed with flutter valve.  She is also using Advair.  OV 11/11/2019: Here today to establish care with new primary pulmonary provider.  Patient doing well today.  She still does have daily sputum production.  She has been using her flutter valve.  Most recently has not used it daily but if she remembers to she will use it 2-3 times a day.  She has no one at home to help her with postural drainage or CPT.  She was unable to obtain sputum cultures recently.  No fevers chills night sweats weight loss.  No hemoptysis recently.  During her last exacerbation she did have a few streaks of blood.  OV 03/17/2019: Patient with bronchiectasis.  Here with ongoing daily sputum production.  She does feels that the Stiolto is helping however she is unable to qualify for financial assistance and the medication is too expensive for her to continue on a regular basis.  She was also unable to afford vest therapy.  Insurance stated they would cover it however was still on a charge for $130 a month and she could not afford this.  She still deals with daily cough and sputum production.   She does not usually routinely use her nebulizer.  Mainly because she does not like dragging all of the equipment out.  Patient denies fevers night sweats weight loss.  Patient denies hemoptysis  OV 10/03/2020: follow up for bronchiectasis.  She has been using her flutter valve as well as vest therapy regularly.  She is seeing significant improvement in her dyspnea and shortness of breath with use of Stiolto inhaler.  She denies hemoptysis.  Otherwise have been doing well.  She does still hate the daily sputum production.  She is concerned about losing her Stiolto due to cost.  So she did bring paperwork in for financial assistance from [facility]    Past Medical History:  Diagnosis Date  . Adenomatous polyp of colon 03/2007  . Allergic rhinitis   . Allergy    has a lot congestion  . Anxiety   . Blood transfusion 1974  . Bronchiectasis ([place])   . Cancer ([place])    left arm  . Depression   . Diabetes mellitus without complication (HCC)    no meds  . Diastolic dysfunction    a. 01/2014 Echo: EF 60-65%, no rwma, Gr1 DD, Ao sclerosis w/o stenosis. Mild TR.  . Diverticulosis of colon (without mention of hemorrhage)   . DOE (dyspnea on exertion)   . Fatigue   . Fecal incontinence   . GERD (gastroesophageal reflux disease)   . Glaucoma   .  Goiter    right side  . Heart murmur   . History of stress test    a. 01/2014 Lexi MV: no ischemia/infarct.  . Hyperlipidemia   . Hypertension    a. 11/2018 Renal artery duplex: No RAS.   Marland Kitchen Hypothyroidism   . IBS (irritable bowel syndrome)   . Migraine   . Mitral valve prolapse    a. 01/2014 Echo: no MR/MVP noted.  . Neck mass    right side/ no airway problems/ per pt has had since 2005 (goiter)  . Nonspecific abnormal electrocardiogram (ECG) (EKG)   . Palpitations   . Palpitations    a. 01/2014 CardioNet: wore for 9 days (rash). Sinus tach - 100's. No SVT/AF.  Marland Kitchen Polyp, sigmoid colon   . Post-operative nausea and vomiting   . Pulmonary nodules   .  Recurrent UTI   . Tremor      Family History  Problem Relation Age of Onset  . Diabetes Father   . Throat cancer Mother   . Coronary artery disease Mother   . Prostate cancer Brother   . Asthma Daughter   . Scoliosis Daughter   . Colon cancer Paternal Grandfather   . Esophageal cancer Neg Hx   . Rectal cancer Neg Hx   . Stomach cancer Neg Hx      Past Surgical History:  Procedure Laterality Date  . ABDOMINAL HYSTERECTOMY    . CARDIOVASCULAR STRESS TEST  09/19/2011   No scintigraphic evidence of inducible myocardial ischemia. Pharmacological stress test without chest pain or EKG changes for ischemia.  . COLONOSCOPY    . ESOPHAGOGASTRODUODENOSCOPY    . MASTOIDECTOMY     rt ear/ as a teenager  . OOPHORECTOMY    . SKIN CANCER EXCISION Left    arm  . TOTAL HIP ARTHROPLASTY  5/10   left  . TRANSTHORACIC ECHOCARDIOGRAM  09/19/2011   EF >16%, stage 1 diastolic dysfunction, mild tricuspid valve regurg    Social History   Socioeconomic History  . Marital status: Widowed    Spouse name: Not on file  . Number of children: 2  . Years of education: 10th grade  . Highest education level: Not on file  Occupational History  . Occupation: Retired    Comment: Recruitment consultant  . Smoking status: Never Smoker  . Smokeless tobacco: Never Used  Vaping Use  . Vaping Use: Never used  Substance and Sexual Activity  . Alcohol use: Yes    Comment: rare  . Drug use: No  . Sexual activity: Not on file  Other Topics Concern  . Not on file  Social History Narrative   Widowed since 2002. Lives with her Daughter.   Social Determinants of Health   Financial Resource Strain:   . Difficulty of Paying Living Expenses: Not on file  Food Insecurity:   . Worried About Charity fundraiser in the Last Year: Not on file  . Ran Out of Food in the Last Year: Not on file  Transportation Needs:   . Lack of Transportation (Medical): Not on file  . Lack of Transportation (Non-Medical): Not on  file  Physical Activity:   . Days of Exercise per Week: Not on file  . Minutes of Exercise per Session: Not on file  Stress:   . Feeling of Stress : Not on file  Social Connections:   . Frequency of Communication with Friends and Family: Not on file  . Frequency of Social Gatherings with Friends  and Family: Not on file  . Attends Religious Services: Not on file  . Active Member of Clubs or Organizations: Not on file  . Attends Archivist Meetings: Not on file  . Marital Status: Not on file  Intimate Partner Violence:   . Fear of Current or Ex-Partner: Not on file  . Emotionally Abused: Not on file  . Physically Abused: Not on file  . Sexually Abused: Not on file     Allergies  Allergen Reactions  . Prednisone     REACTION: Increase eye pressure with gloucoma. Broke out in rash after injection per Dr. Percell Miller  . Adhesive [Tape]     Causes blisters/irritates skin  . Penicillins     REACTION: Rash  . Sulfonamide Derivatives     REACTION: Rash - not sure     Outpatient Medications Prior to Visit  Medication Sig Dispense Refill  . acetaminophen (TYLENOL) 500 MG tablet Take 500 mg by mouth as needed.    Marland Kitchen albuterol (PROVENTIL) (2.5 MG/3ML) 0.083% nebulizer solution Take 3 mLs (2.5 mg total) by nebulization every 6 (six) hours as needed for wheezing or shortness of breath. 75 mL 12  . amLODipine (NORVASC) 10 MG tablet Take 0.5 tablets (5 mg total) by mouth daily as needed. 90 tablet 0  . brimonidine (ALPHAGAN) 0.2 % ophthalmic solution Place 1 drop into both eyes 2 (two) times daily. Place 1 drop into both eyes 2 (two) times daily.    . carvedilol (COREG) 12.5 MG tablet Take 1 tablet (12.5 mg total) by mouth 2 (two) times daily. 180 tablet 3  . cephALEXin (KEFLEX) 500 MG capsule SMARTSIG:4 Capsule(s) By Mouth    . cetirizine (ZYRTEC) 10 MG tablet Take 10 mg by mouth daily.     . cyclobenzaprine (FLEXERIL) 10 MG tablet Take 10 mg by mouth 3 (three) times daily as needed for  muscle spasms.    . dorzolamide (TRUSOPT) 2 % ophthalmic solution Place 1 drop into both eyes 2 (two) times daily. Place 1 drop into both eyes 2 (two) times daily.    . fluticasone-salmeterol (ADVAIR HFA) 115-21 MCG/ACT inhaler Inhale 2 puffs into the lungs 2 (two) times daily. 1 Inhaler 11  . hydrALAZINE (APRESOLINE) 25 MG tablet Take 2 tablets (50 mg total) by mouth 3 (three) times daily as needed (Take as needed for systolic blood pressure > 170.). 60 tablet 3  . levothyroxine (SYNTHROID, LEVOTHROID) 50 MCG tablet Take 50 mcg by mouth daily before breakfast.    . LORazepam (ATIVAN) 0.5 MG tablet Take 0.5 mg by mouth 2 (two) times daily as needed (tremors).    . losartan (COZAAR) 100 MG tablet Take 1 tablet (100 mg total) by mouth daily. 90 tablet 3  . Multiple Vitamins-Minerals (MULTIVITAMIN WITH MINERALS) tablet Take 1 tablet by mouth daily.      . pantoprazole (PROTONIX) 40 MG tablet Take 1 tablet (40 mg total) by mouth daily before breakfast. 90 tablet 3  . Polyethyl Glycol-Propyl Glycol (SYSTANE OP) Apply to eye daily.    . pravastatin (PRAVACHOL) 40 MG tablet Take 1 tablet (40 mg total) by mouth at bedtime.    . sertraline (ZOLOFT) 100 MG tablet Take 1 tablet (100 mg total) by mouth at bedtime. 2 at bedtime    . sodium chloride (OCEAN NASAL SPRAY) 0.65 % nasal spray Place 2 sprays into both nostrils at bedtime. Use as needed    . Tiotropium Bromide-Olodaterol (STIOLTO RESPIMAT) 2.5-2.5 MCG/ACT AERS Inhale 2 puffs into  the lungs daily. 4 g 0  . traMADol (ULTRAM) 50 MG tablet Take 50 mg by mouth 2 (two) times daily as needed (pain).     . traZODone (DESYREL) 50 MG tablet Take 50 mg by mouth at bedtime.      . triamcinolone (NASACORT ALLERGY 24HR) 55 MCG/ACT AERO nasal inhaler Place 1 spray into the nose daily.     No facility-administered medications prior to visit.    Review of Systems  Constitutional: Negative for chills, fever, malaise/fatigue and weight loss.  HENT: Negative for  hearing loss, sore throat and tinnitus.   Eyes: Negative for blurred vision and double vision.  Respiratory: Positive for cough and sputum production. Negative for hemoptysis, shortness of breath, wheezing and stridor.   Cardiovascular: Negative for chest pain, palpitations, orthopnea, leg swelling and PND.  Gastrointestinal: Negative for abdominal pain, constipation, diarrhea, heartburn, nausea and vomiting.  Genitourinary: Negative for dysuria, hematuria and urgency.  Musculoskeletal: Negative for joint pain and myalgias.  Skin: Negative for itching and rash.  Neurological: Negative for dizziness, tingling, weakness and headaches.  Endo/Heme/Allergies: Negative for environmental allergies. Does not bruise/bleed easily.  Psychiatric/Behavioral: Negative for depression. The patient is not nervous/anxious and does not have insomnia.   All other systems reviewed and are negative.    Objective:  Physical Exam Vitals reviewed.  Constitutional:      General: She is not in acute distress.    Appearance: She is well-developed.  HENT:     Head: Normocephalic and atraumatic.  Eyes:     General: No scleral icterus.    Conjunctiva/sclera: Conjunctivae normal.     Pupils: Pupils are equal, round, and reactive to light.  Neck:     Vascular: No JVD.     Trachea: No tracheal deviation.  Cardiovascular:     Rate and Rhythm: Normal rate and regular rhythm.     Heart sounds: Normal heart sounds. No murmur heard.   Pulmonary:     Effort: Pulmonary effort is normal. No tachypnea, accessory muscle usage or respiratory distress.     Breath sounds: Normal breath sounds. No stridor. No wheezing, rhonchi or rales.  Abdominal:     General: Bowel sounds are normal. There is no distension.     Palpations: Abdomen is soft.     Tenderness: There is no abdominal tenderness.  Musculoskeletal:        General: No tenderness.     Cervical back: Neck supple.  Lymphadenopathy:     Cervical: No cervical  adenopathy.  Skin:    General: Skin is warm and dry.     Capillary Refill: Capillary refill takes less than 2 seconds.     Findings: No rash.  Neurological:     Mental Status: She is alert and oriented to person, place, and time.  Psychiatric:        Behavior: Behavior normal.      Vitals:   10/03/20 1647  BP: 130/72  Pulse: 81  Temp: 97.6 F (36.4 C)  TempSrc: Temporal  SpO2: 95%  Weight: 156 lb 12.8 oz (71.1 kg)  Height: 5\' 8"  (1.727 m)   95% on RA BMI Readings from Last 3 Encounters:  10/03/20 23.84 kg/m  04/12/20 24.18 kg/m  03/16/20 23.81 kg/m   Wt Readings from Last 3 Encounters:  10/03/20 156 lb 12.8 oz (71.1 kg)  04/12/20 159 lb (72.1 kg)  03/16/20 156 lb 9.6 oz (71 kg)     CBC    Component Value Date/Time  WBC 5.3 11/06/2018 0754   RBC 4.87 11/06/2018 0754   HGB 14.5 11/06/2018 0754   HGB CANCELED 11/04/2018 1114   HCT 43.8 11/06/2018 0754   HCT CANCELED 11/04/2018 1114   PLT 226 11/06/2018 0754   PLT CANCELED 11/04/2018 1114   MCV 89.9 11/06/2018 0754   MCH 29.8 11/06/2018 0754   MCHC 33.1 11/06/2018 0754   RDW 12.7 11/06/2018 0754   LYMPHSABS 1.1 11/06/2018 0754   LYMPHSABS CANCELED 11/04/2018 1114   MONOABS 0.3 11/06/2018 0754   EOSABS 0.2 11/06/2018 0754   EOSABS CANCELED 11/04/2018 1114   BASOSABS 0.0 11/06/2018 0754   BASOSABS CANCELED 11/04/2018 1114      Chest Imaging: November 2020: CT chest Bronchiectasis scattered tree-in-bud nodularities. The patient's images have been independently reviewed by me.    Pulmonary Functions Testing Results: PFT Results Latest Ref Rng & Units 02/25/2018  FVC-Pre L 3.15  FVC-Predicted Pre % 106  Pre FEV1/FVC % % 61  FEV1-Pre L 1.92  FEV1-Predicted Pre % 86  DLCO uncorrected ml/min/mmHg 14.54  DLCO UNC% % 51  DLVA Predicted % 58  TLC L 5.53  TLC % Predicted % 100  RV % Predicted % 86    FeNO: None   Pathology: None   Echocardiogram: None   Heart Catheterization: None       Assessment & Plan:     ICD-10-CM   1. Bronchiectasis without complication ([place])  I45.8 Flutter valve  2. Sputum production  R05.8   3. Dyspnea on exertion  R06.00     Discussion:  This is an 85 year old female, recurrent daily sputum production and uncomplicated bronchiectasis.  Plan: Continue use of Stiolto New prescription for [employer] and medication assistance form completed today in the office. Continue vest therapy. She also needs a repeat DME supply order for vest therapy to continue. Continue albuterol nebulizer treatments as needed. Continue flutter valve daily and regular postural and pulmonary toileting to help drainage.   Current Outpatient Medications:  .  acetaminophen (TYLENOL) 500 MG tablet, Take 500 mg by mouth as needed., Disp: , Rfl:  .  albuterol (PROVENTIL) (2.5 MG/3ML) 0.083% nebulizer solution, Take 3 mLs (2.5 mg total) by nebulization every 6 (six) hours as needed for wheezing or shortness of breath., Disp: 75 mL, Rfl: 12 .  amLODipine (NORVASC) 10 MG tablet, Take 0.5 tablets (5 mg total) by mouth daily as needed., Disp: 90 tablet, Rfl: 0 .  brimonidine (ALPHAGAN) 0.2 % ophthalmic solution, Place 1 drop into both eyes 2 (two) times daily. Place 1 drop into both eyes 2 (two) times daily., Disp: , Rfl:  .  carvedilol (COREG) 12.5 MG tablet, Take 1 tablet (12.5 mg total) by mouth 2 (two) times daily., Disp: 180 tablet, Rfl: 3 .  cephALEXin (KEFLEX) 500 MG capsule, SMARTSIG:4 Capsule(s) By Mouth, Disp: , Rfl:  .  cetirizine (ZYRTEC) 10 MG tablet, Take 10 mg by mouth daily. , Disp: , Rfl:  .  cyclobenzaprine (FLEXERIL) 10 MG tablet, Take 10 mg by mouth 3 (three) times daily as needed for muscle spasms., Disp: , Rfl:  .  dorzolamide (TRUSOPT) 2 % ophthalmic solution, Place 1 drop into both eyes 2 (two) times daily. Place 1 drop into both eyes 2 (two) times daily., Disp: , Rfl:  .  fluticasone-salmeterol (ADVAIR HFA) 115-21 MCG/ACT inhaler, Inhale 2 puffs into the  lungs 2 (two) times daily., Disp: 1 Inhaler, Rfl: 11 .  hydrALAZINE (APRESOLINE) 25 MG tablet, Take 2 tablets (50 mg total) by  mouth 3 (three) times daily as needed (Take as needed for systolic blood pressure > 170.)., Disp: 60 tablet, Rfl: 3 .  levothyroxine (SYNTHROID, LEVOTHROID) 50 MCG tablet, Take 50 mcg by mouth daily before breakfast., Disp: , Rfl:  .  LORazepam (ATIVAN) 0.5 MG tablet, Take 0.5 mg by mouth 2 (two) times daily as needed (tremors)., Disp: , Rfl:  .  losartan (COZAAR) 100 MG tablet, Take 1 tablet (100 mg total) by mouth daily., Disp: 90 tablet, Rfl: 3 .  Multiple Vitamins-Minerals (MULTIVITAMIN WITH MINERALS) tablet, Take 1 tablet by mouth daily.  , Disp: , Rfl:  .  pantoprazole (PROTONIX) 40 MG tablet, Take 1 tablet (40 mg total) by mouth daily before breakfast., Disp: 90 tablet, Rfl: 3 .  Polyethyl Glycol-Propyl Glycol (SYSTANE OP), Apply to eye daily., Disp: , Rfl:  .  pravastatin (PRAVACHOL) 40 MG tablet, Take 1 tablet (40 mg total) by mouth at bedtime., Disp: , Rfl:  .  sertraline (ZOLOFT) 100 MG tablet, Take 1 tablet (100 mg total) by mouth at bedtime. 2 at bedtime, Disp: , Rfl:  .  sodium chloride (OCEAN NASAL SPRAY) 0.65 % nasal spray, Place 2 sprays into both nostrils at bedtime. Use as needed, Disp: , Rfl:  .  Tiotropium Bromide-Olodaterol (STIOLTO RESPIMAT) 2.5-2.5 MCG/ACT AERS, Inhale 2 puffs into the lungs daily., Disp: 4 g, Rfl: 0 .  traMADol (ULTRAM) 50 MG tablet, Take 50 mg by mouth 2 (two) times daily as needed (pain). , Disp: , Rfl:  .  traZODone (DESYREL) 50 MG tablet, Take 50 mg by mouth at bedtime.  , Disp: , Rfl:  .  triamcinolone (NASACORT ALLERGY 24HR) 55 MCG/ACT AERO nasal inhaler, Place 1 spray into the nose daily., Disp: , Rfl:   I spent 30 minutes dedicated to the care of this patient on the date of this encounter to include pre-visit review of records, face-to-face time with the patient discussing conditions above, post visit ordering of testing,  clinical documentation with the electronic health record, making appropriate referrals as documented, and communicating necessary findings to members of the patients care team.    Garner Nash, DO [facility] 10/03/2020 4:52 PM

## 2020-10-03 NOTE — Patient Instructions (Addendum)
Thank you for visiting Dr. Valeta Harms at [facility]. Today we recommend the following:  Med assistance forms for stiolto completed  Continue mucinex and cough suppressants as needed  New Flutter Valve today  Continue vest therapy   Return in about 1 year (around 10/03/2021), or if symptoms worsen or fail to improve, for with APP or Dr. Valeta Harms.    Please do your part to reduce the spread of COVID-19.

## 2020-10-10 ENCOUNTER — Other Ambulatory Visit: Payer: Self-pay | Admitting: Cardiovascular Disease

## 2020-10-19 DIAGNOSIS — H401131 Primary open-angle glaucoma, bilateral, mild stage: Secondary | ICD-10-CM | POA: Diagnosis not present

## 2020-10-20 ENCOUNTER — Other Ambulatory Visit (HOSPITAL_COMMUNITY): Payer: Self-pay | Admitting: Internal Medicine

## 2020-10-20 DIAGNOSIS — Z1231 Encounter for screening mammogram for malignant neoplasm of breast: Secondary | ICD-10-CM

## 2020-10-29 DIAGNOSIS — J479 Bronchiectasis, uncomplicated: Secondary | ICD-10-CM | POA: Diagnosis not present

## 2020-11-07 ENCOUNTER — Other Ambulatory Visit: Payer: Self-pay

## 2020-11-07 ENCOUNTER — Ambulatory Visit (HOSPITAL_COMMUNITY)
Admission: RE | Admit: 2020-11-07 | Discharge: 2020-11-07 | Disposition: A | Discharge status: Home / Self Care | Payer: PPO | Source: Ambulatory Visit | Attending: Internal Medicine | Admitting: Internal Medicine

## 2020-11-07 DIAGNOSIS — Z1231 Encounter for screening mammogram for malignant neoplasm of breast: Secondary | ICD-10-CM | POA: Diagnosis not present

## 2020-11-15 ENCOUNTER — Telehealth: Payer: Self-pay | Admitting: Cardiovascular Disease

## 2020-11-15 NOTE — Telephone Encounter (Signed)
Patient's dentist Ty Wooden calling  States that patient is requesting antibiotics before any dental procedures can be done and dentist is checking on why or if there is a specific reason Patient does not have any history that antibiotics would be needed so wanted to make sure they are not missing something Please call [phone] to clarify

## 2020-11-16 DIAGNOSIS — R2240 Localized swelling, mass and lump, unspecified lower limb: Secondary | ICD-10-CM | POA: Diagnosis not present

## 2020-11-16 DIAGNOSIS — I10 Essential (primary) hypertension: Secondary | ICD-10-CM | POA: Diagnosis not present

## 2020-11-16 DIAGNOSIS — C4490 Unspecified malignant neoplasm of skin, unspecified: Secondary | ICD-10-CM | POA: Diagnosis not present

## 2020-11-16 DIAGNOSIS — M1380 Other specified arthritis, unspecified site: Secondary | ICD-10-CM | POA: Diagnosis not present

## 2020-11-16 DIAGNOSIS — I129 Hypertensive chronic kidney disease with stage 1 through stage 4 chronic kidney disease, or unspecified chronic kidney disease: Secondary | ICD-10-CM | POA: Diagnosis not present

## 2020-11-16 DIAGNOSIS — E039 Hypothyroidism, unspecified: Secondary | ICD-10-CM | POA: Diagnosis not present

## 2020-11-16 DIAGNOSIS — R490 Dysphonia: Secondary | ICD-10-CM | POA: Diagnosis not present

## 2020-11-16 DIAGNOSIS — N644 Mastodynia: Secondary | ICD-10-CM | POA: Diagnosis not present

## 2020-11-16 DIAGNOSIS — R000 Tachycardia, unspecified: Secondary | ICD-10-CM | POA: Diagnosis not present

## 2020-11-16 DIAGNOSIS — K2970 Gastritis, unspecified, without bleeding: Secondary | ICD-10-CM | POA: Diagnosis not present

## 2020-11-16 DIAGNOSIS — Z6823 Body mass index (BMI) 23.0-23.9, adult: Secondary | ICD-10-CM | POA: Diagnosis not present

## 2020-11-16 DIAGNOSIS — R109 Unspecified abdominal pain: Secondary | ICD-10-CM | POA: Diagnosis not present

## 2020-11-16 NOTE — Telephone Encounter (Signed)
Return pt call regarding her concern for ABX prophylaxis treatment before her dental procedure, her dentist also tried to reach out. After Ignacia Bayley, NP review pt's chart he advised "she was prev told that she has mitral valve prolapse and so I suspect that that is why she thinks she needs it. MVP used to be an indication for abx proph, but it is no longer (and her subsequent echoes have not shown MVP anyway). So - I agree w/ her dentist...no indication." Explain this to pt, pt stated she is scared that she has some sort of lung infection similar to her son "and whatever it is, its going to break off from my lung and go into my heart". Advised pt if this the case may want to reach out to PCP to sort out her concern for lung infection and if ABX is needed, but pt stated her dentist went ahead and prescribe her 2000mg  of Keflex to take one hour before her dental procedure. She is cover with ABX d/t her concern, thankful for the return call, all concerns and questions address, will call back for anything further.

## 2020-11-21 DIAGNOSIS — M542 Cervicalgia: Secondary | ICD-10-CM | POA: Diagnosis not present

## 2020-11-21 DIAGNOSIS — E039 Hypothyroidism, unspecified: Secondary | ICD-10-CM | POA: Diagnosis not present

## 2020-11-21 DIAGNOSIS — N1831 Chronic kidney disease, stage 3a: Secondary | ICD-10-CM | POA: Diagnosis not present

## 2020-11-21 DIAGNOSIS — I10 Essential (primary) hypertension: Secondary | ICD-10-CM | POA: Diagnosis not present

## 2020-11-21 DIAGNOSIS — K219 Gastro-esophageal reflux disease without esophagitis: Secondary | ICD-10-CM | POA: Diagnosis not present

## 2020-11-21 DIAGNOSIS — E782 Mixed hyperlipidemia: Secondary | ICD-10-CM | POA: Diagnosis not present

## 2020-11-21 DIAGNOSIS — K2970 Gastritis, unspecified, without bleeding: Secondary | ICD-10-CM | POA: Diagnosis not present

## 2020-11-21 DIAGNOSIS — H4010X0 Unspecified open-angle glaucoma, stage unspecified: Secondary | ICD-10-CM | POA: Diagnosis not present

## 2020-11-21 DIAGNOSIS — R7301 Impaired fasting glucose: Secondary | ICD-10-CM | POA: Diagnosis not present

## 2020-11-21 DIAGNOSIS — R809 Proteinuria, unspecified: Secondary | ICD-10-CM | POA: Diagnosis not present

## 2020-11-21 DIAGNOSIS — J479 Bronchiectasis, uncomplicated: Secondary | ICD-10-CM | POA: Diagnosis not present

## 2020-11-21 DIAGNOSIS — E875 Hyperkalemia: Secondary | ICD-10-CM | POA: Diagnosis not present

## 2020-11-28 DIAGNOSIS — J479 Bronchiectasis, uncomplicated: Secondary | ICD-10-CM | POA: Diagnosis not present

## 2020-12-12 DIAGNOSIS — M25551 Pain in right hip: Secondary | ICD-10-CM | POA: Diagnosis not present

## 2020-12-12 DIAGNOSIS — M25561 Pain in right knee: Secondary | ICD-10-CM | POA: Diagnosis not present

## 2020-12-12 DIAGNOSIS — M545 Low back pain, unspecified: Secondary | ICD-10-CM | POA: Diagnosis not present

## 2020-12-28 ENCOUNTER — Telehealth: Payer: Self-pay | Admitting: Pulmonary Disease

## 2020-12-28 NOTE — Telephone Encounter (Signed)
Called patient she states she is having congestion so bad that her vest and mucinex are not helping her. She is coughing up green mucus. She is very short of breath when any activity even small. She is not currently on oxygen. And she is hoarse.  Patient wants to know if anything can be done.  I asked her if she has had a covid test she said absolutely not she does not have Covid and she will not get a test.  Dr. Valeta Harms please advise.

## 2020-12-28 NOTE — Telephone Encounter (Signed)
PCCM:  Televisit with APP today or tomorrow  Garner Nash, DO  Pulmonary Critical Care 12/28/2020 2:16 PM

## 2020-12-28 NOTE — Telephone Encounter (Signed)
Called and spoke with pt and she is aware of televisit with BW in the morning at 10:30

## 2020-12-29 ENCOUNTER — Other Ambulatory Visit: Payer: Self-pay

## 2020-12-29 ENCOUNTER — Ambulatory Visit (INDEPENDENT_AMBULATORY_CARE_PROVIDER_SITE_OTHER): Payer: PPO | Admitting: Primary Care

## 2020-12-29 DIAGNOSIS — J479 Bronchiectasis, uncomplicated: Secondary | ICD-10-CM | POA: Diagnosis not present

## 2020-12-29 DIAGNOSIS — J471 Bronchiectasis with (acute) exacerbation: Secondary | ICD-10-CM

## 2020-12-29 MED ORDER — DOXYCYCLINE HYCLATE 100 MG PO TABS: 100.0000 mg | ORAL_TABLET | Freq: Two times a day (BID) | ORAL | 0 refills | Status: DC

## 2020-12-29 NOTE — Progress Notes (Signed)
Virtual Visit via Telephone Note  I connected with Christy Richardson on 12/29/20 at 10:30 AM EST by telephone and verified that I am speaking with the correct person using two identifiers.  Location: Patient: Home Provider: Office   I discussed the limitations, risks, security and privacy concerns of performing an evaluation and management service by telephone and the availability of in person appointments. I also discussed with the patient that there may be a patient responsible charge related to this service. The patient expressed understanding and agreed to proceed.   History of Present Illness: 86 year old female, never smoked.  Medical history significant for bronchiectasis, allergic rhinitis, hypertension, left ventricular diastolic dysfunction, GERD, IBS, hypothyroidism.  Patient of Dr. Valeta Harms, last seen in office on 10/03/2020.   12/29/2020 Patient contacted today for acute visit.  Patient reports increased cough and chest congestion with green thick mucus x 5 days. Associated shortness of breath with minimal activity and voice hoarseness. She has been consistently using her flutter valve and therapy vest without improvement. She is complaint with Stiolot respimat. She took doxycycline several months ago and she noticed her breathing and congestion improved.  No fever, chills, sweats, chest pain/tightness, N/V/D.   Observations/Objective:  - Able to speak in full sentences; no overt shortness of breath   Assessment and Plan:  Bronchiectasis with acute exacerbation  - Increased cough with thick purulent mucus. We will treat acute exacerbation with course of Doxycyline 100mg  twice daily x 7 days. Continue Stiolto 2 puffs once daily; therapy vest and flutter valve 2-3 times a day. Per Dr. Valeta Harms, ok to start prophylactic Azithromycin 250mg  MWF if pateint continues to have bronchiectasis flare ups.   Follow Up Instructions:   - If symptoms do not improve or worsen despite above plan  I  discussed the assessment and treatment plan with the patient. The patient was provided an opportunity to ask questions and all were answered. The patient agreed with the plan and demonstrated an understanding of the instructions.   The patient was advised to call back or seek an in-person evaluation if the symptoms worsen or if the condition fails to improve as anticipated.  I provided 22 minutes of non-face-to-face time during this encounter.   Martyn Ehrich, NP

## 2020-12-31 DIAGNOSIS — M542 Cervicalgia: Secondary | ICD-10-CM | POA: Diagnosis not present

## 2020-12-31 DIAGNOSIS — E875 Hyperkalemia: Secondary | ICD-10-CM | POA: Diagnosis not present

## 2020-12-31 DIAGNOSIS — E782 Mixed hyperlipidemia: Secondary | ICD-10-CM | POA: Diagnosis not present

## 2020-12-31 DIAGNOSIS — K2970 Gastritis, unspecified, without bleeding: Secondary | ICD-10-CM | POA: Diagnosis not present

## 2020-12-31 DIAGNOSIS — K219 Gastro-esophageal reflux disease without esophagitis: Secondary | ICD-10-CM | POA: Diagnosis not present

## 2020-12-31 DIAGNOSIS — R7301 Impaired fasting glucose: Secondary | ICD-10-CM | POA: Diagnosis not present

## 2020-12-31 DIAGNOSIS — R809 Proteinuria, unspecified: Secondary | ICD-10-CM | POA: Diagnosis not present

## 2020-12-31 DIAGNOSIS — J479 Bronchiectasis, uncomplicated: Secondary | ICD-10-CM | POA: Diagnosis not present

## 2020-12-31 DIAGNOSIS — N1831 Chronic kidney disease, stage 3a: Secondary | ICD-10-CM | POA: Diagnosis not present

## 2020-12-31 DIAGNOSIS — H4010X0 Unspecified open-angle glaucoma, stage unspecified: Secondary | ICD-10-CM | POA: Diagnosis not present

## 2020-12-31 DIAGNOSIS — E039 Hypothyroidism, unspecified: Secondary | ICD-10-CM | POA: Diagnosis not present

## 2020-12-31 DIAGNOSIS — I10 Essential (primary) hypertension: Secondary | ICD-10-CM | POA: Diagnosis not present

## 2021-01-20 ENCOUNTER — Other Ambulatory Visit: Payer: Self-pay | Admitting: Cardiovascular Disease

## 2021-01-26 ENCOUNTER — Telehealth: Payer: Self-pay | Admitting: Pulmonary Disease

## 2021-01-26 MED ORDER — ALBUTEROL SULFATE (2.5 MG/3ML) 0.083% IN NEBU: 2.5000 mg | INHALATION_SOLUTION | Freq: Four times a day (QID) | RESPIRATORY_TRACT | 12 refills | Status: DC | PRN

## 2021-01-26 NOTE — Telephone Encounter (Signed)
Refill has been sent to the pharmacy per pts request.  Nothing further is needed.  

## 2021-01-29 DIAGNOSIS — J479 Bronchiectasis, uncomplicated: Secondary | ICD-10-CM | POA: Diagnosis not present

## 2021-01-30 DIAGNOSIS — R7301 Impaired fasting glucose: Secondary | ICD-10-CM | POA: Diagnosis not present

## 2021-01-30 DIAGNOSIS — H4010X0 Unspecified open-angle glaucoma, stage unspecified: Secondary | ICD-10-CM | POA: Diagnosis not present

## 2021-01-30 DIAGNOSIS — E039 Hypothyroidism, unspecified: Secondary | ICD-10-CM | POA: Diagnosis not present

## 2021-01-30 DIAGNOSIS — I10 Essential (primary) hypertension: Secondary | ICD-10-CM | POA: Diagnosis not present

## 2021-01-30 DIAGNOSIS — K219 Gastro-esophageal reflux disease without esophagitis: Secondary | ICD-10-CM | POA: Diagnosis not present

## 2021-01-30 DIAGNOSIS — E782 Mixed hyperlipidemia: Secondary | ICD-10-CM | POA: Diagnosis not present

## 2021-01-30 DIAGNOSIS — R000 Tachycardia, unspecified: Secondary | ICD-10-CM | POA: Diagnosis not present

## 2021-01-30 DIAGNOSIS — K2970 Gastritis, unspecified, without bleeding: Secondary | ICD-10-CM | POA: Diagnosis not present

## 2021-01-30 DIAGNOSIS — J479 Bronchiectasis, uncomplicated: Secondary | ICD-10-CM | POA: Diagnosis not present

## 2021-01-30 DIAGNOSIS — J310 Chronic rhinitis: Secondary | ICD-10-CM | POA: Diagnosis not present

## 2021-02-03 ENCOUNTER — Other Ambulatory Visit: Payer: Self-pay | Admitting: Pulmonary Disease

## 2021-02-06 DIAGNOSIS — E049 Nontoxic goiter, unspecified: Secondary | ICD-10-CM | POA: Diagnosis not present

## 2021-02-06 DIAGNOSIS — M25551 Pain in right hip: Secondary | ICD-10-CM | POA: Diagnosis not present

## 2021-02-06 DIAGNOSIS — M62442 Contracture of muscle, left hand: Secondary | ICD-10-CM | POA: Diagnosis not present

## 2021-02-06 DIAGNOSIS — J479 Bronchiectasis, uncomplicated: Secondary | ICD-10-CM | POA: Diagnosis not present

## 2021-02-06 DIAGNOSIS — Z96642 Presence of left artificial hip joint: Secondary | ICD-10-CM | POA: Diagnosis not present

## 2021-02-10 ENCOUNTER — Other Ambulatory Visit (HOSPITAL_COMMUNITY): Payer: Self-pay | Admitting: Internal Medicine

## 2021-02-10 DIAGNOSIS — E042 Nontoxic multinodular goiter: Secondary | ICD-10-CM

## 2021-02-16 ENCOUNTER — Other Ambulatory Visit: Payer: Self-pay | Admitting: Cardiovascular Disease

## 2021-02-17 ENCOUNTER — Other Ambulatory Visit: Payer: Self-pay

## 2021-02-17 ENCOUNTER — Ambulatory Visit (HOSPITAL_COMMUNITY)
Admission: RE | Admit: 2021-02-17 | Discharge: 2021-02-17 | Disposition: A | Discharge status: Home / Self Care | Payer: PPO | Source: Ambulatory Visit | Attending: Internal Medicine | Admitting: Internal Medicine

## 2021-02-17 DIAGNOSIS — E042 Nontoxic multinodular goiter: Secondary | ICD-10-CM | POA: Diagnosis not present

## 2021-02-17 DIAGNOSIS — E041 Nontoxic single thyroid nodule: Secondary | ICD-10-CM | POA: Diagnosis not present

## 2021-02-26 DIAGNOSIS — J479 Bronchiectasis, uncomplicated: Secondary | ICD-10-CM | POA: Diagnosis not present

## 2021-02-27 DIAGNOSIS — M25552 Pain in left hip: Secondary | ICD-10-CM | POA: Diagnosis not present

## 2021-02-27 DIAGNOSIS — M545 Low back pain, unspecified: Secondary | ICD-10-CM | POA: Diagnosis not present

## 2021-03-01 DIAGNOSIS — I10 Essential (primary) hypertension: Secondary | ICD-10-CM | POA: Diagnosis not present

## 2021-03-01 DIAGNOSIS — E782 Mixed hyperlipidemia: Secondary | ICD-10-CM | POA: Diagnosis not present

## 2021-03-01 DIAGNOSIS — J310 Chronic rhinitis: Secondary | ICD-10-CM | POA: Diagnosis not present

## 2021-03-01 DIAGNOSIS — R000 Tachycardia, unspecified: Secondary | ICD-10-CM | POA: Diagnosis not present

## 2021-03-01 DIAGNOSIS — H4010X0 Unspecified open-angle glaucoma, stage unspecified: Secondary | ICD-10-CM | POA: Diagnosis not present

## 2021-03-01 DIAGNOSIS — K219 Gastro-esophageal reflux disease without esophagitis: Secondary | ICD-10-CM | POA: Diagnosis not present

## 2021-03-01 DIAGNOSIS — K2970 Gastritis, unspecified, without bleeding: Secondary | ICD-10-CM | POA: Diagnosis not present

## 2021-03-21 DIAGNOSIS — H903 Sensorineural hearing loss, bilateral: Secondary | ICD-10-CM | POA: Diagnosis not present

## 2021-03-21 DIAGNOSIS — H7011 Chronic mastoiditis, right ear: Secondary | ICD-10-CM | POA: Diagnosis not present

## 2021-03-21 DIAGNOSIS — H6983 Other specified disorders of Eustachian tube, bilateral: Secondary | ICD-10-CM | POA: Diagnosis not present

## 2021-03-22 DIAGNOSIS — Z20828 Contact with and (suspected) exposure to other viral communicable diseases: Secondary | ICD-10-CM | POA: Diagnosis not present

## 2021-03-22 DIAGNOSIS — I129 Hypertensive chronic kidney disease with stage 1 through stage 4 chronic kidney disease, or unspecified chronic kidney disease: Secondary | ICD-10-CM | POA: Diagnosis not present

## 2021-03-22 DIAGNOSIS — Z681 Body mass index (BMI) 19 or less, adult: Secondary | ICD-10-CM | POA: Diagnosis not present

## 2021-03-22 DIAGNOSIS — E875 Hyperkalemia: Secondary | ICD-10-CM | POA: Diagnosis not present

## 2021-03-22 DIAGNOSIS — E039 Hypothyroidism, unspecified: Secondary | ICD-10-CM | POA: Diagnosis not present

## 2021-03-22 DIAGNOSIS — E782 Mixed hyperlipidemia: Secondary | ICD-10-CM | POA: Diagnosis not present

## 2021-03-22 DIAGNOSIS — N1831 Chronic kidney disease, stage 3a: Secondary | ICD-10-CM | POA: Diagnosis not present

## 2021-03-22 DIAGNOSIS — R7301 Impaired fasting glucose: Secondary | ICD-10-CM | POA: Diagnosis not present

## 2021-03-22 DIAGNOSIS — I10 Essential (primary) hypertension: Secondary | ICD-10-CM | POA: Diagnosis not present

## 2021-03-22 DIAGNOSIS — E7849 Other hyperlipidemia: Secondary | ICD-10-CM | POA: Diagnosis not present

## 2021-03-27 DIAGNOSIS — E875 Hyperkalemia: Secondary | ICD-10-CM | POA: Diagnosis not present

## 2021-03-27 DIAGNOSIS — K2970 Gastritis, unspecified, without bleeding: Secondary | ICD-10-CM | POA: Diagnosis not present

## 2021-03-27 DIAGNOSIS — N1831 Chronic kidney disease, stage 3a: Secondary | ICD-10-CM | POA: Diagnosis not present

## 2021-03-27 DIAGNOSIS — I10 Essential (primary) hypertension: Secondary | ICD-10-CM | POA: Diagnosis not present

## 2021-03-27 DIAGNOSIS — R809 Proteinuria, unspecified: Secondary | ICD-10-CM | POA: Diagnosis not present

## 2021-03-27 DIAGNOSIS — J479 Bronchiectasis, uncomplicated: Secondary | ICD-10-CM | POA: Diagnosis not present

## 2021-03-27 DIAGNOSIS — M542 Cervicalgia: Secondary | ICD-10-CM | POA: Diagnosis not present

## 2021-03-27 DIAGNOSIS — R7301 Impaired fasting glucose: Secondary | ICD-10-CM | POA: Diagnosis not present

## 2021-03-27 DIAGNOSIS — E039 Hypothyroidism, unspecified: Secondary | ICD-10-CM | POA: Diagnosis not present

## 2021-03-27 DIAGNOSIS — K219 Gastro-esophageal reflux disease without esophagitis: Secondary | ICD-10-CM | POA: Diagnosis not present

## 2021-03-27 DIAGNOSIS — H4010X0 Unspecified open-angle glaucoma, stage unspecified: Secondary | ICD-10-CM | POA: Diagnosis not present

## 2021-03-27 DIAGNOSIS — E782 Mixed hyperlipidemia: Secondary | ICD-10-CM | POA: Diagnosis not present

## 2021-04-02 DIAGNOSIS — K219 Gastro-esophageal reflux disease without esophagitis: Secondary | ICD-10-CM | POA: Diagnosis not present

## 2021-04-02 DIAGNOSIS — F329 Major depressive disorder, single episode, unspecified: Secondary | ICD-10-CM | POA: Diagnosis not present

## 2021-04-11 ENCOUNTER — Other Ambulatory Visit: Payer: Self-pay | Admitting: Cardiovascular Disease

## 2021-04-13 NOTE — Progress Notes (Signed)
Cardiology Office Note  Date:  04/14/2021   ID:  DONOVAN GATCHEL, DOB 03-24-1935, MRN [id number]  PCP:  Celene Squibb, MD   Chief Complaint  Patient presents with  . 12 month follow up     "doing well." Medications reviewed by the patient verbally.     HPI:  Mahari Vankirk Phelpsis a 86 y.o.femalewith a history of  labile hypertension,  Anxiety mitral valve prolapse per the patient with no mitral valve regurgitation on echocardiogram 2015 with history of palpitations,  bronchiectasis,  GERD, goirter,  mixed hyperlipidemia,  hypertension She presents today for routine follow-up of her labile blood pressure and tachycardia  Last office visit May 2021 Prior history nocturia but does have high fluid intake in the evenings Taking hydralazine as needed Ativan for anxiety/sleep Chronic neck pain On last clinic visit, High pressure in the Am, Takes most BP meds in the PM for this This seems to have helped somewhat, still somewhat labile Rarely some lightheadedness and dizziness in the daytime  Sedentary, hip issues, neck and back issues No regular exercise program  Concerned about prior mention of mitral valve prolapse Echocardiogram 2020 reviewed with her, no significant MR, no prolapse noted Discussed no indication for antibiotics  Lab work reviewed Excellent CBC, total cholesterol, BMP  EKG personally reviewed by myself on todays visit NSR rate 77 bpm no significant ST or T wave changes  Past medical history reviewed Echo 07/2019   1. The left ventricle has normal systolic function with an ejection fraction of 60-65%. The cavity size was normal. There is mildly increased left ventricular wall thickness. Left ventricular diastolic Doppler parameters are consistent with impaired  relaxation.  2. The right ventricle has normal systolic function. The cavity was normal. There is no increase in right ventricular wall thickness. Right ventricular systolic pressure is normal with an  estimated pressure of 25.3 mmHg.   In October 2012 a nuclear perfusion study revealed normal perfusion  echo Doppler study revealed normal systolic function with grade 1 diastolic dysfunction, mild aortic valve sclerosis without stenosis, trace AR, MR, and mild TR as well as PR. She had upper normal RV pressure 26 mm.  echo Doppler study on 02/02/2014. This showed an ejection fraction of 60-65%. She had grade 1 diastolic dysfunction. There was mild aortic sclerosis without stenosis.    lexiscan perfusion study on 02/04/2014 was normal without scar or ischemia. Post stress ejection fraction was 73%.    CardioNet monitor to assess for palpitations. Unfortunately, she developed significant skin irritation, and only wore this for 9 days. This revealed episodes of sinus rhythm, but she did have episodes of sinus tachycardia with rates in the low 100s. There was there were no episodes of SVT or atrial fibrillation.   PMH:   has a past medical history of Adenomatous polyp of colon (03/2007), Allergic rhinitis, Allergy, Anxiety, Blood transfusion (1974), Bronchiectasis ([place]), Cancer ([place]), Depression, Diabetes mellitus without complication ([place]), Diastolic dysfunction, Diverticulosis of colon (without mention of hemorrhage), DOE (dyspnea on exertion), Fatigue, Fecal incontinence, GERD (gastroesophageal reflux disease), Glaucoma, Goiter, Heart murmur, History of stress test, Hyperlipidemia, Hypertension, Hypothyroidism, IBS (irritable bowel syndrome), Migraine, Mitral valve prolapse, Neck mass, Nonspecific abnormal electrocardiogram (ECG) (EKG), Palpitations, Palpitations, Polyp, sigmoid colon, Post-operative nausea and vomiting, Pulmonary nodules, Recurrent UTI, and Tremor.  PSH:    Past Surgical History:  Procedure Laterality Date  . ABDOMINAL HYSTERECTOMY    . CARDIOVASCULAR STRESS TEST  09/19/2011   No scintigraphic evidence of inducible myocardial ischemia.  Pharmacological stress test  without chest pain or EKG changes for ischemia.  . COLONOSCOPY    . ESOPHAGOGASTRODUODENOSCOPY    . MASTOIDECTOMY     rt ear/ as a teenager  . OOPHORECTOMY    . SKIN CANCER EXCISION Left    arm  . TOTAL HIP ARTHROPLASTY  5/10   left  . TRANSTHORACIC ECHOCARDIOGRAM  09/19/2011   EF >82%, stage 1 diastolic dysfunction, mild tricuspid valve regurg    Current Outpatient Medications  Medication Sig Dispense Refill  . acetaminophen (TYLENOL) 500 MG tablet Take 500 mg by mouth as needed.    Marland Kitchen albuterol (PROVENTIL) (2.5 MG/3ML) 0.083% nebulizer solution Take 3 mLs (2.5 mg total) by nebulization every 6 (six) hours as needed for wheezing or shortness of breath. 75 mL 12  . amLODipine (NORVASC) 10 MG tablet TAKE 1/2 TABLET BY MOUTH ONCE DAILY. 45 tablet 0  . brimonidine (ALPHAGAN) 0.2 % ophthalmic solution Place 1 drop into both eyes 2 (two) times daily. Place 1 drop into both eyes 2 (two) times daily.    . carvedilol (COREG) 12.5 MG tablet TAKE (1) TABLET BY MOUTH TWICE DAILY. 180 tablet 0  . cetirizine (ZYRTEC) 10 MG tablet Take 10 mg by mouth daily.     . cyclobenzaprine (FLEXERIL) 10 MG tablet Take 10 mg by mouth 3 (three) times daily as needed for muscle spasms.    . dorzolamide (TRUSOPT) 2 % ophthalmic solution Place 1 drop into both eyes 2 (two) times daily. Place 1 drop into both eyes 2 (two) times daily.    Marland Kitchen doxycycline (VIBRA-TABS) 100 MG tablet Take 1 tablet (100 mg total) by mouth 2 (two) times daily. 14 tablet 0  . hydrALAZINE (APRESOLINE) 25 MG tablet Take 2 tablets (50 mg total) by mouth 3 (three) times daily as needed (Take as needed for systolic blood pressure > 170.). 60 tablet 3  . levothyroxine (SYNTHROID, LEVOTHROID) 50 MCG tablet Take 50 mcg by mouth daily before breakfast.    . LORazepam (ATIVAN) 0.5 MG tablet Take 0.5 mg by mouth 2 (two) times daily as needed (tremors).    . losartan (COZAAR) 100 MG tablet TAKE ONE TABLET BY MOUTH ONCE DAILY. 90 tablet 0  . Multiple  Vitamins-Minerals (MULTIVITAMIN WITH MINERALS) tablet Take 1 tablet by mouth daily.    . pantoprazole (PROTONIX) 40 MG tablet Take 1 tablet (40 mg total) by mouth daily before breakfast. 90 tablet 3  . Polyethyl Glycol-Propyl Glycol (SYSTANE OP) Apply to eye daily.    . pravastatin (PRAVACHOL) 40 MG tablet Take 1 tablet (40 mg total) by mouth at bedtime.    . sertraline (ZOLOFT) 100 MG tablet Take 1 tablet (100 mg total) by mouth at bedtime. 2 at bedtime    . sodium chloride (OCEAN) 0.65 % nasal spray Place 2 sprays into both nostrils at bedtime. Use as needed    . Tiotropium Bromide-Olodaterol (STIOLTO RESPIMAT) 2.5-2.5 MCG/ACT AERS Inhale 2 puffs into the lungs daily. 4 g 0  . traMADol (ULTRAM) 50 MG tablet Take 50 mg by mouth 2 (two) times daily as needed (pain).     . traZODone (DESYREL) 50 MG tablet Take 50 mg by mouth at bedtime.    . triamcinolone (NASACORT) 55 MCG/ACT AERO nasal inhaler Place 1 spray into the nose daily.     No current facility-administered medications for this visit.     Allergies:   Prednisone, Adhesive [tape], Penicillins, and Sulfonamide derivatives   Social History:  The patient  reports that she has never smoked. She has never used smokeless tobacco. She reports current alcohol use. She reports that she does not use drugs.   Family History:   family history includes Asthma in her daughter; Colon cancer in her paternal grandfather; Coronary artery disease in her mother; Diabetes in her father; Prostate cancer in her brother; Scoliosis in her daughter; Throat cancer in her mother.    Review of Systems: Review of Systems  Constitutional: Negative.   HENT: Negative.   Respiratory: Negative.   Cardiovascular: Negative.   Gastrointestinal: Negative.   Musculoskeletal: Negative.   Neurological: Negative.   Psychiatric/Behavioral: Negative.   All other systems reviewed and are negative.   PHYSICAL EXAM: VS:  BP 120/60 (BP Location: Left Arm, Patient  Position: Sitting, Cuff Size: Normal)   Pulse 77   Ht 5\' 8"  (1.727 m)   Wt 153 lb 6 oz (69.6 kg)   SpO2 98%   BMI 23.32 kg/m  , BMI Body mass index is 23.32 kg/m. Constitutional:  oriented to person, place, and time. No distress.  HENT:  Head: Grossly normal Eyes:  no discharge. No scleral icterus.  Neck: No JVD, no carotid bruits  Cardiovascular: Regular rate and rhythm, no murmurs appreciated Pulmonary/Chest: Clear to auscultation bilaterally, no wheezes or rails Abdominal: Soft.  no distension.  no tenderness.  Musculoskeletal: Normal range of motion Neurological:  normal muscle tone. Coordination normal. No atrophy Skin: Skin warm and dry Psychiatric: normal affect, pleasant  Recent Labs: No results found for requested labs within last 8760 hours.    Lipid Panel Lab Results  Component Value Date   CHOL 201 (H) 03/01/2009   HDL 47 03/01/2009   LDLCALC 116 (H) 03/01/2009   TRIG 189 (H) 03/01/2009      Wt Readings from Last 3 Encounters:  04/14/21 153 lb 6 oz (69.6 kg)  10/03/20 156 lb 12.8 oz (71.1 kg)  04/12/20 159 lb (72.1 kg)     ASSESSMENT AND PLAN:  Hyperlipidemia LDL goal <70 - Plan: EKG 12-Lead Cholesterol is at goal on the current lipid regimen. No changes to the medications were made.  Essential hypertension - Plan: EKG 12-Lead Blood pressure is well controlled on today's visit. No changes made to the medications.  Bronchiectasis Stable, chronic sputum Followed by pulmonary  Anxiety Reassurance provided Lorazepam provided by primary care   Total encounter time more than 25 minutes  Greater than 50% was spent in counseling and coordination of care with the patient   No orders of the defined types were placed in this encounter.    Signed, Esmond Plants, M.D., Ph.D. 04/14/2021  [place], [place]

## 2021-04-14 ENCOUNTER — Encounter: Payer: Self-pay | Admitting: Cardiovascular Disease

## 2021-04-14 ENCOUNTER — Ambulatory Visit: Payer: PPO | Admitting: Cardiovascular Disease

## 2021-04-14 ENCOUNTER — Other Ambulatory Visit: Payer: Self-pay

## 2021-04-14 VITALS — BP 120/60 | HR 77 | Ht 68.0 in | Wt 153.4 lb

## 2021-04-14 DIAGNOSIS — I5189 Other ill-defined heart diseases: Secondary | ICD-10-CM | POA: Diagnosis not present

## 2021-04-14 DIAGNOSIS — R0602 Shortness of breath: Secondary | ICD-10-CM

## 2021-04-14 DIAGNOSIS — E782 Mixed hyperlipidemia: Secondary | ICD-10-CM

## 2021-04-14 DIAGNOSIS — R0989 Other specified symptoms and signs involving the circulatory and respiratory systems: Secondary | ICD-10-CM

## 2021-04-14 DIAGNOSIS — R000 Tachycardia, unspecified: Secondary | ICD-10-CM | POA: Diagnosis not present

## 2021-04-14 MED ORDER — AMLODIPINE BESYLATE 5 MG PO TABS: 5.0000 mg | ORAL_TABLET | Freq: Every day | ORAL | 3 refills | Status: DC

## 2021-04-14 NOTE — Patient Instructions (Signed)
Medication Instructions:  No changes  If you need a refill on your cardiac medications before your next appointment, please call your pharmacy.    Lab work: No new labs needed   If you have labs (blood work) drawn today and your tests are completely normal, you will receive your results only by: . MyChart Message (if you have MyChart) OR . A paper copy in the mail If you have any lab test that is abnormal or we need to change your treatment, we will call you to review the results.   Testing/Procedures: No new testing needed   Follow-Up: At [facility], you and your health needs are our priority.  As part of our continuing mission to provide you with exceptional heart care, we have created designated Provider Care Teams.  These Care Teams include your primary Cardiologist (physician) and Advanced Practice Providers (APPs -  Physician Assistants and Nurse Practitioners) who all work together to provide you with the care you need, when you need it.  . You will need a follow up appointment in 12 months  . Providers on your designated Care Team:   . Christopher Berge, NP . Ryan Dunn, PA-C . Jacquelyn Visser, PA-C  Any Other Special Instructions Will Be Listed Below (If Applicable).  COVID-19 Vaccine Information can be found at: [email or web address] For questions related to vaccine distribution or appointments, please email [email or web address] or call [phone].     

## 2021-04-17 DIAGNOSIS — H401131 Primary open-angle glaucoma, bilateral, mild stage: Secondary | ICD-10-CM | POA: Diagnosis not present

## 2021-04-26 ENCOUNTER — Other Ambulatory Visit: Payer: Self-pay | Admitting: Cardiovascular Disease

## 2021-06-01 DIAGNOSIS — I10 Essential (primary) hypertension: Secondary | ICD-10-CM | POA: Diagnosis not present

## 2021-06-01 DIAGNOSIS — E1165 Type 2 diabetes mellitus with hyperglycemia: Secondary | ICD-10-CM | POA: Diagnosis not present

## 2021-06-19 ENCOUNTER — Telehealth: Payer: Self-pay | Admitting: Pulmonary Disease

## 2021-06-19 MED ORDER — DOXYCYCLINE HYCLATE 100 MG PO TABS: 100.0000 mg | ORAL_TABLET | Freq: Two times a day (BID) | ORAL | 0 refills | Status: DC

## 2021-06-19 NOTE — Telephone Encounter (Signed)
Patient is aware of recommendations and voiced her understanding.  Nothing further needed at this time.  

## 2021-06-19 NOTE — Telephone Encounter (Signed)
I will send in Doxycycline twice a day x 10 days for suspected bronchiectasis exacerbation. If coughing up bright red blood > 240cc needs ED evaluation. If she continues to have flares up we may want to discuss starting Azithromycin 250mg  MWF prophylactically

## 2021-06-19 NOTE — Telephone Encounter (Signed)
Primary Pulmonologist: Dr.Icard Last office visit and with whom: Beth, NP 12/29/20 What do we see them for (pulmonary problems): bronchiectasis  Last OV assessment/plan:  Follow Up Instructions:    - If symptoms do not improve or worsen despite above plan   I discussed the assessment and treatment plan with the patient. The patient was provided an opportunity to ask questions and all were answered. The patient agreed with the plan and demonstrated an understanding of the instructions.   The patient was advised to call back or seek an in-person evaluation if the symptoms worsen or if the condition fails to improve as anticipated.   I provided 22 minutes of non-face-to-face time during this encounter.     Martyn Ehrich, NP  Next available openings for OV 8/4/2   Reason for call:  Called and spoke with Patient. Patient stated she has noticed in the last 2 weeks she has pink tints to her sputum at times.  Patient stated she has had this in the past and was told she needed antibiotics, because of bacteria in her lungs.  Patient stated she is using nebs and percussion vest. Patient request any prescriptions to be sent to [facility].  Message routed to [facility], NP to advise     Allergies  Allergen Reactions   Prednisone     REACTION: Increase eye pressure with gloucoma. Broke out in rash after injection per Dr. Cherlynn Perches [Tape]     Causes blisters/irritates skin   Penicillins     REACTION: Rash   Sulfonamide Derivatives     REACTION: Rash - not sure    Immunization History  Administered Date(s) Administered   Fluad Quad(high Dose 65+) 09/19/2020   Influenza Split 09/05/2013, 08/18/2014, 08/31/2015   Influenza Whole 09/23/2006, 10/03/2007, 08/25/2008, 08/03/2010, 09/02/2012   Influenza, High Dose Seasonal PF 09/20/2017, 09/19/2018, 09/01/2019   Influenza-Unspecified 08/03/2014, 09/02/2018   Moderna Sars-Covid-2 Vaccination 01/28/2020, 02/26/2020   Pneumococcal  Conjugate-13 08/18/2014   Pneumococcal Polysaccharide-23 05/04/2003

## 2021-06-21 DIAGNOSIS — D2272 Melanocytic nevi of left lower limb, including hip: Secondary | ICD-10-CM | POA: Diagnosis not present

## 2021-06-21 DIAGNOSIS — C44119 Basal cell carcinoma of skin of left eyelid, including canthus: Secondary | ICD-10-CM | POA: Diagnosis not present

## 2021-06-21 DIAGNOSIS — C441191 Basal cell carcinoma of skin of left upper eyelid, including canthus: Secondary | ICD-10-CM | POA: Diagnosis not present

## 2021-06-21 DIAGNOSIS — D225 Melanocytic nevi of trunk: Secondary | ICD-10-CM | POA: Diagnosis not present

## 2021-06-21 DIAGNOSIS — Z1283 Encounter for screening for malignant neoplasm of skin: Secondary | ICD-10-CM | POA: Diagnosis not present

## 2021-06-21 DIAGNOSIS — D485 Neoplasm of uncertain behavior of skin: Secondary | ICD-10-CM | POA: Diagnosis not present

## 2021-06-21 DIAGNOSIS — B078 Other viral warts: Secondary | ICD-10-CM | POA: Diagnosis not present

## 2021-07-02 DIAGNOSIS — E7800 Pure hypercholesterolemia, unspecified: Secondary | ICD-10-CM | POA: Diagnosis not present

## 2021-07-02 DIAGNOSIS — I10 Essential (primary) hypertension: Secondary | ICD-10-CM | POA: Diagnosis not present

## 2021-07-10 ENCOUNTER — Telehealth: Payer: Self-pay | Admitting: Cardiovascular Disease

## 2021-07-10 NOTE — Telephone Encounter (Signed)
Spoke to pt. C/o blood pressure dropping low after AM medication (Coreg) w/ dizziness.  Pt reports "this pattern cycles around and it is starting to occur again."  Yesterday 08/07 0830 151/86 HR 80 1215 78/49 HR 80 1330 70/42 HR 83 1800 144/70 HR 69   This morning 08/08 0830 141/84 HR 80 1030 76/47 HR 81  Pt takes Coreg 12.5 in AM and 18.'75mg'$  (1 1/2 tablet) in PM. States she was told to take 1 1/2 tablets at night per Dr. Rockey Situ of [place]. (Updated med list to reflect this). Also takes Amlodipine '5mg'$  QHS and Losartan '100mg'$  QHS.   Had pt re take BP while on the phone and BP NOW is 117/67 HR 75.  Denies dizziness at this time.   Pt last seen by Dr. Rockey Situ 04/14/21 and has h/o labile HTN.  Pt's HR stays consistently 80-low 80s and BP continues to drop after AM Coreg.  Advised pt hold next dose of Coreg, ensure she is staying well hydrated, and monitor BP/HR.  I will forward message to provider in office and call back with provider's recc.  Advised pt to contact office of any changes in the meantime. Pt voiced understanding.

## 2021-07-10 NOTE — Telephone Encounter (Signed)
Pt c/o BP issue: STAT if pt c/o blurred vision, one-sided weakness or slurred speech  1. What are your last 5 BP readings?  Yesterday 08/07 0830 151/86 HR 80 1215 78/49 HR 80 1330 70/42 HR 83 1800 144/70 HR 69  This morning 08/08 0830 141/84 HR 80 1030 76/47 HR 81   2. Are you having any other symptoms (ex. Dizziness, headache, blurred vision, passed out)? Gets dizzy, can't focus   3. What is your BP issue? BP is low.  Please call to discuss.

## 2021-07-14 ENCOUNTER — Telehealth: Payer: Self-pay | Admitting: Pulmonary Disease

## 2021-07-14 DIAGNOSIS — J479 Bronchiectasis, uncomplicated: Secondary | ICD-10-CM

## 2021-07-14 NOTE — Telephone Encounter (Signed)
Primary Pulmonologist: Dr. Valeta Harms Last office visit and with whom: 12/29/20 with Beth What do we see them for (pulmonary problems): bronchiectasis Last OV assessment/plan: Assessment and Plan:   Bronchiectasis with acute exacerbation  - Increased cough with thick purulent mucus. We will treat acute exacerbation with course of Doxycyline '100mg'$  twice daily x 7 days. Continue Stiolto 2 puffs once daily; therapy vest and flutter valve 2-3 times a day. Per Dr. Valeta Harms, ok to start prophylactic Azithromycin '250mg'$  MWF if pateint continues to have bronchiectasis flare ups.    Follow Up Instructions:    - If symptoms do not improve or worsen despite above plan   I discussed the assessment and treatment plan with the patient. The patient was provided an opportunity to ask questions and all were answered. The patient agreed with the plan and demonstrated an understanding of the instructions.   The patient was advised to call back or seek an in-person evaluation if the symptoms worsen or if the condition fails to improve as anticipated.  Was appointment offered to patient (explain)?  First avail APP appt not until 8/29, and first avail appt with BI not until 9/2   Reason for call: Called and spoke with pt who states the congestion is not getting any better after she was prescribed doxycycline 7/18. Pt also states that she has been coughing up come phlegm that does have some streaks of blood in it. Said that along with some streaks of blood, the phlegm will also be green tinged.  Pt said she has been told before that she might need to be put on maintenance abx therapy.  Pt said that she has been having hoarseness and wonders if that could be coming from the stiolto inhaler.  Pt denies any complaints of fever. States that she does use the trilogy vest.   Pt said that she uses her albuterol neb sol once a day. Pt said that she also takes mucinex every day and also said that she has tussion cough syrup that  she uses every day.  Due to the congestion and cough not getting any better, pt wants to know what we can recommend. Beth, please advise. Allergies  Allergen Reactions   Prednisone     REACTION: Increase eye pressure with gloucoma. Broke out in rash after injection per Dr. Cherlynn Perches [Tape]     Causes blisters/irritates skin   Penicillins     REACTION: Rash   Sulfonamide Derivatives     REACTION: Rash - not sure    Immunization History  Administered Date(s) Administered   Fluad Quad(high Dose 65+) 09/19/2020   Influenza Split 09/05/2013, 08/18/2014, 08/31/2015   Influenza Whole 09/23/2006, 10/03/2007, 08/25/2008, 08/03/2010, 09/02/2012   Influenza, High Dose Seasonal PF 09/20/2017, 09/19/2018, 09/01/2019   Influenza-Unspecified 08/03/2014, 09/02/2018   Moderna Sars-Covid-2 Vaccination 01/28/2020, 02/26/2020   Pneumococcal Conjugate-13 08/18/2014   Pneumococcal Polysaccharide-23 05/04/2003

## 2021-07-14 NOTE — Telephone Encounter (Signed)
Needs first available visit. CXR prior and respiratory and AFB sputum sample. She should be taking mucinex '1200mg'$  BID. Use nebulizer three times a day. If coughing up bright red blood >120-240cc or has any chest pain/discomfort needs ED eval.  CC: Dr. Valeta Harms

## 2021-07-14 NOTE — Telephone Encounter (Signed)
Spoke with the pt and notified of response per Christy Richardson  She verbalized understanding  Offered ov 07/17/21 with MW and she refused  Scheduled for next available with [name] 07/31/21 and cxr ordered  She states will have to get the sputum cups when she comes for appt due to transportaion

## 2021-07-17 NOTE — Telephone Encounter (Signed)
Called patient and spoke to her with the following recommendations:  Kate Sable, MD  You; Solmon Ice, RN 3 days ago   Due to low blood pressures, reduce dose of Coreg to Coreg 12.5 mg twice daily.  Continue to monitor blood pressures.  If blood pressure is still low, and or patient still dizzy, advised patient to hold amlodipine.  Thank you    Patient was concerned if she did this that her BP would run to high in the morning and she is just not sure what to do.  She was hesitant to change her evening dosages. She will continue to record her BP and let us know if she is seeing a trend in the low BP readings.   I informed her that I would pass on the information to Dr. Rockey Situ and his nurse as an Juluis Rainier.  Patient was grateful for the call back.

## 2021-07-19 DIAGNOSIS — I10 Essential (primary) hypertension: Secondary | ICD-10-CM | POA: Diagnosis not present

## 2021-07-21 NOTE — Telephone Encounter (Signed)
Left voicemail message for patient to call back for review of medication changes due to her low blood pressures.      Picard-Tagnolli, Coleen, RN routed conversation to [employer] Triage 38 minutes ago (4:23 PM)   Rockey Situ, Kathlene November, MD  Picard-Tagnolli, Coleen, RN 40 minutes ago (4:21 PM)   Lets try to move amlodipine losartan to dinnertime , not before bed  Likely contributing to drop in the morning pressure  Cut the AM Coreg in half, 6.25  Continue current dose of carvedilol in p.m.  May get less of a drop after AM medications ,  carvedilol lower dose/other 2 medications were taken at dinner not right before bed   Thx  TGollan

## 2021-07-24 MED ORDER — AMLODIPINE BESYLATE 5 MG PO TABS: ORAL_TABLET | ORAL | 3 refills | Status: DC

## 2021-07-24 MED ORDER — CARVEDILOL 12.5 MG PO TABS: ORAL_TABLET | ORAL | 2 refills | Status: DC

## 2021-07-24 MED ORDER — LOSARTAN POTASSIUM 100 MG PO TABS: ORAL_TABLET | ORAL | 0 refills | Status: DC

## 2021-07-24 NOTE — Telephone Encounter (Signed)
Patient calling to discuss below Please call

## 2021-07-24 NOTE — Telephone Encounter (Signed)
Spoke with pt, reviewed Dr. Donivan Scull recc below of medication changes.  Pt voiced understanding. She will:   - CHANGE Carvedilol to HALF tablet (6.25 mg) in the MORNING and will continue taking whole tablet (12.5 mg) in EVENING.   - CHANGE taking Losartan 100 mg daily at [facility] time   - CHANGE taking Amlodipine 5 mg daily at  [facility] time  Pt will continue to monitor BP/HR in the meantime.  Pt has no further questions at this time.

## 2021-07-26 DIAGNOSIS — E785 Hyperlipidemia, unspecified: Secondary | ICD-10-CM | POA: Diagnosis not present

## 2021-07-26 DIAGNOSIS — N189 Chronic kidney disease, unspecified: Secondary | ICD-10-CM | POA: Diagnosis not present

## 2021-07-26 DIAGNOSIS — R809 Proteinuria, unspecified: Secondary | ICD-10-CM | POA: Diagnosis not present

## 2021-07-26 DIAGNOSIS — E039 Hypothyroidism, unspecified: Secondary | ICD-10-CM | POA: Diagnosis not present

## 2021-07-26 DIAGNOSIS — R7303 Prediabetes: Secondary | ICD-10-CM | POA: Diagnosis not present

## 2021-07-26 DIAGNOSIS — R0981 Nasal congestion: Secondary | ICD-10-CM | POA: Diagnosis not present

## 2021-07-26 DIAGNOSIS — I10 Essential (primary) hypertension: Secondary | ICD-10-CM | POA: Diagnosis not present

## 2021-07-26 DIAGNOSIS — Z0001 Encounter for general adult medical examination with abnormal findings: Secondary | ICD-10-CM | POA: Diagnosis not present

## 2021-07-26 DIAGNOSIS — J471 Bronchiectasis with (acute) exacerbation: Secondary | ICD-10-CM | POA: Diagnosis not present

## 2021-07-31 ENCOUNTER — Encounter: Payer: Self-pay | Admitting: Acute Care

## 2021-07-31 ENCOUNTER — Telehealth: Payer: Self-pay | Admitting: Cardiovascular Disease

## 2021-07-31 ENCOUNTER — Other Ambulatory Visit: Payer: Self-pay

## 2021-07-31 ENCOUNTER — Ambulatory Visit (INDEPENDENT_AMBULATORY_CARE_PROVIDER_SITE_OTHER): Payer: PPO | Admitting: Acute Care

## 2021-07-31 ENCOUNTER — Telehealth: Payer: Self-pay | Admitting: Acute Care

## 2021-07-31 ENCOUNTER — Ambulatory Visit (INDEPENDENT_AMBULATORY_CARE_PROVIDER_SITE_OTHER): Payer: PPO

## 2021-07-31 VITALS — BP 112/68 | HR 78 | Temp 98.4°F | Ht 68.0 in | Wt 148.2 lb

## 2021-07-31 DIAGNOSIS — R059 Cough, unspecified: Secondary | ICD-10-CM | POA: Diagnosis not present

## 2021-07-31 DIAGNOSIS — J479 Bronchiectasis, uncomplicated: Secondary | ICD-10-CM

## 2021-07-31 DIAGNOSIS — J471 Bronchiectasis with (acute) exacerbation: Secondary | ICD-10-CM

## 2021-07-31 DIAGNOSIS — R911 Solitary pulmonary nodule: Secondary | ICD-10-CM

## 2021-07-31 MED ORDER — STIOLTO RESPIMAT 2.5-2.5 MCG/ACT IN AERS: 2.0000 | INHALATION_SPRAY | Freq: Every day | RESPIRATORY_TRACT | 0 refills | Status: DC

## 2021-07-31 MED ORDER — DOXYCYCLINE HYCLATE 100 MG PO TABS: 100.0000 mg | ORAL_TABLET | Freq: Two times a day (BID) | ORAL | 0 refills | Status: AC

## 2021-07-31 NOTE — Patient Instructions (Addendum)
It is good to see you today CXR today shows bronchitis.  We will check sputum culture today. ( Sputum for Cx, Fungal and AFB) Please provide patient with culture cups.  We will treat you with doxycycline . Due to your Sulfa and Penicillin allergies, this is our best option until we get sputum cultures back.  Take 1 tablet twice daily x 10 days. Continue using chest very 3 times daily x 10 minutes for the next week.  Flutter valve  with every commercial. Continue Mucinex 1200 mg daily with a full glass of water. Please drink plenty of water to thin out your secretions.  Follow up in 2 weeks, hopefully we will have culture results back, with [name] NP CT Chest without contrast in 3 weeks to evaluate the nodule noted on the CXR. [facility]) \ Call if you need [place] sooner than the 2 weeks.  Please contact office for sooner follow up if symptoms do not improve or worsen or seek emergency care

## 2021-07-31 NOTE — Telephone Encounter (Signed)
I left a message for the patient to call back about the lab work. I needed to get a date and time for the sputum sample. Office number given. Waiting on a call back.

## 2021-07-31 NOTE — Telephone Encounter (Signed)
Patient is returning phone call. Patient phone number is [phone].

## 2021-07-31 NOTE — Telephone Encounter (Signed)
Patient would like to discuss Losartan . Patient's daughter states on PCP labwork she had protein in her kidney. She would like to discuss possibly referral to nephrology

## 2021-07-31 NOTE — Telephone Encounter (Signed)
I called the patient and she reprots that she completed the sputum sample at 830 am on 07/31/21. I will put this on the label and place in the lab tomorrow.

## 2021-07-31 NOTE — Progress Notes (Deleted)
History of Present Illness Christy Richardson is a 86 y.o. female with ***   07/31/2021  Test Results:  CBC Latest Ref Rng & Units 11/06/2018 11/04/2018 03/20/2018  WBC 4.0 - 10.5 K/uL 5.3 CANCELED 10.8(H)  Hemoglobin 12.0 - 15.0 g/dL 14.5 CANCELED 15.9(H)  Hematocrit 36.0 - 46.0 % 43.8 CANCELED 47.9(H)  Platelets 150 - 400 K/uL 226 CANCELED 221    BMP Latest Ref Rng & Units 11/06/2018 11/04/2018 03/20/2018  Glucose 70 - 99 mg/dL 103(H) 93 120(H)  BUN 8 - 23 mg/dL '12 13 14  '$ Creatinine 0.44 - 1.00 mg/dL 0.77 0.90 1.02(H)  BUN/Creat Ratio 12 - 28 - 14 -  Sodium 135 - 145 mmol/L 139 140 135  Potassium 3.5 - 5.1 mmol/L 3.9 5.4(H) 4.2  Chloride 98 - 111 mmol/L 104 101 100(L)  CO2 22 - 32 mmol/L '28 24 24  '$ Calcium 8.9 - 10.3 mg/dL 9.3 10.0 9.6    BNP No results found for: BNP  ProBNP No results found for: PROBNP  PFT    Component Value Date/Time   FEV1PRE 1.92 02/25/2018 1055   FVCPRE 3.15 02/25/2018 1055   TLC 5.53 02/25/2018 1055   DLCOUNC 14.54 02/25/2018 1055   PREFEV1FVCRT 61 02/25/2018 1055    DG Chest 2 View  Result Date: 07/31/2021 CLINICAL DATA:  Cough, bronchiectasis EXAM: CHEST - 2 VIEW COMPARISON:  Chest radiograph 12/10/2018 FINDINGS: The cardiomediastinal silhouette is within normal limits. There is calcified atherosclerotic plaque of the aortic arch. There is unchanged asymmetric elevation of the right hemidiaphragm. There is mild central peribronchial cuffing with increased perihilar opacity. There is a 2.6 cm density projecting over a lower thoracic vertebral body on the lateral projection not present on the prior study. There is blunting of the left costophrenic angle, similar to the prior study and favored to reflect scar. There is no significant pleural effusion. There is no pneumothorax. There is no acute osseous abnormality. IMPRESSION: 1. Mild central peribronchial cuffing and perihilar opacities suggesting bronchitis in the correct clinical setting. 2. 2.6 cm  nodular opacity projecting over a lower thoracic vertebral body on the lateral projection not present on the prior study. Recommend nonemergent CT chest to evaluate for parenchymal nodule. Electronically Signed   By: Valetta Mole M.D.   On: 07/31/2021 10:36     Past medical hx Past Medical History:  Diagnosis Date   Adenomatous polyp of colon 03/2007   Allergic rhinitis    Allergy    has a lot congestion   Anxiety    Blood transfusion 1974   Bronchiectasis ([place])    Cancer ([place])    left arm   Depression    Diabetes mellitus without complication ([place])    no meds   Diastolic dysfunction    a. 01/2014 Echo: EF 60-65%, no rwma, Gr1 DD, Ao sclerosis w/o stenosis. Mild TR.   Diverticulosis of colon (without mention of hemorrhage)    DOE (dyspnea on exertion)    Fatigue    Fecal incontinence    GERD (gastroesophageal reflux disease)    Glaucoma    Goiter    right side   Heart murmur    History of stress test    a. 01/2014 Lexi MV: no ischemia/infarct.   Hyperlipidemia    Hypertension    a. 11/2018 Renal artery duplex: No RAS.    Hypothyroidism    IBS (irritable bowel syndrome)    Migraine    Mitral valve prolapse    a. 01/2014 Echo:  no MR/MVP noted.   Neck mass    right side/ no airway problems/ per pt has had since 2005 (goiter)   Nonspecific abnormal electrocardiogram (ECG) (EKG)    Palpitations    Palpitations    a. 01/2014 CardioNet: wore for 9 days (rash). Sinus tach - 100's. No SVT/AF.   Polyp, sigmoid colon    Post-operative nausea and vomiting    Pulmonary nodules    Recurrent UTI    Tremor      Social History   Tobacco Use   Smoking status: Never   Smokeless tobacco: Never  Vaping Use   Vaping Use: Never used  Substance Use Topics   Alcohol use: Yes    Comment: rare   Drug use: No    Ms.Reeser reports that she has never smoked. She has never used smokeless tobacco. She reports current alcohol use. She reports that she does not use drugs.  Tobacco  Cessation: Counseling given: Not Answered   Past surgical hx, Family hx, Social hx all reviewed.  Current Outpatient Medications on File Prior to Visit  Medication Sig   acetaminophen (TYLENOL) 500 MG tablet Take 500 mg by mouth as needed.   albuterol (PROVENTIL) (2.5 MG/3ML) 0.083% nebulizer solution Take 3 mLs (2.5 mg total) by nebulization every 6 (six) hours as needed for wheezing or shortness of breath.   amLODipine (NORVASC) 5 MG tablet Take one tablet (5 mg) by mouth daily at dinner time   brimonidine (ALPHAGAN) 0.2 % ophthalmic solution Place 1 drop into both eyes 2 (two) times daily. Place 1 drop into both eyes 2 (two) times daily.   carvedilol (COREG) 12.5 MG tablet Take HALF tablet (6.25 mg) in MORNING, take WHOLE tablet (12.5 mg) in EVENING.   cetirizine (ZYRTEC) 10 MG tablet Take 10 mg by mouth daily.    cyclobenzaprine (FLEXERIL) 10 MG tablet Take 10 mg by mouth 3 (three) times daily as needed for muscle spasms.   dorzolamide (TRUSOPT) 2 % ophthalmic solution Place 1 drop into both eyes 2 (two) times daily. Place 1 drop into both eyes 2 (two) times daily.   hydrALAZINE (APRESOLINE) 25 MG tablet Take 2 tablets (50 mg total) by mouth 3 (three) times daily as needed (Take as needed for systolic blood pressure > 170.).   levothyroxine (SYNTHROID, LEVOTHROID) 50 MCG tablet Take 50 mcg by mouth daily before breakfast.   LORazepam (ATIVAN) 0.5 MG tablet Take 0.5 mg by mouth 2 (two) times daily as needed (tremors).   losartan (COZAAR) 100 MG tablet Take one tablet (100 mg) by mouth daily at dinner time   Multiple Vitamins-Minerals (MULTIVITAMIN WITH MINERALS) tablet Take 1 tablet by mouth daily.   pantoprazole (PROTONIX) 40 MG tablet Take 1 tablet (40 mg total) by mouth daily before breakfast.   Polyethyl Glycol-Propyl Glycol (SYSTANE OP) Apply to eye daily.   pravastatin (PRAVACHOL) 40 MG tablet Take 1 tablet (40 mg total) by mouth at bedtime.   sertraline (ZOLOFT) 100 MG tablet Take 1  tablet (100 mg total) by mouth at bedtime. 2 at bedtime   sodium chloride (OCEAN) 0.65 % nasal spray Place 2 sprays into both nostrils at bedtime. Use as needed   Tiotropium Bromide-Olodaterol (STIOLTO RESPIMAT) 2.5-2.5 MCG/ACT AERS Inhale 2 puffs into the lungs daily.   traMADol (ULTRAM) 50 MG tablet Take 50 mg by mouth 2 (two) times daily as needed (pain).    traZODone (DESYREL) 50 MG tablet Take 50 mg by mouth at bedtime.   triamcinolone (NASACORT)  55 MCG/ACT AERO nasal inhaler Place 1 spray into the nose daily.   No current facility-administered medications on file prior to visit.     Allergies  Allergen Reactions   Prednisone     REACTION: Increase eye pressure with gloucoma. Broke out in rash after injection per Dr. Cherlynn Perches [Tape]     Causes blisters/irritates skin   Penicillins     REACTION: Rash   Sulfonamide Derivatives     REACTION: Rash - not sure    Review Of Systems:  Constitutional:   No  weight loss, night sweats,  Fevers, chills, fatigue, or  lassitude.  HEENT:   No headaches,  Difficulty swallowing,  Tooth/dental problems, or  Sore throat,                No sneezing, itching, ear ache, nasal congestion, post nasal drip,   CV:  No chest pain,  Orthopnea, PND, swelling in lower extremities, anasarca, dizziness, palpitations, syncope.   GI  No heartburn, indigestion, abdominal pain, nausea, vomiting, diarrhea, change in bowel habits, loss of appetite, bloody stools.   Resp: No shortness of breath with exertion or at rest.  No excess mucus, no productive cough,  No non-productive cough,  No coughing up of blood.  No change in color of mucus.  No wheezing.  No chest wall deformity  Skin: no rash or lesions.  GU: no dysuria, change in color of urine, no urgency or frequency.  No flank pain, no hematuria   MS:  No joint pain or swelling.  No decreased range of motion.  No back pain.  Psych:  No change in mood or affect. No depression or anxiety.  No memory  loss.   Vital Signs BP 112/68 (BP Location: Right Arm, Patient Position: Sitting, Cuff Size: Normal)   Pulse 78   Temp 98.4 F (36.9 C) (Oral)   Ht '5\' 8"'$  (1.727 m)   Wt 148 lb 3.2 oz (67.2 kg)   SpO2 96%   BMI 22.53 kg/m    Physical Exam:  General- No distress,  A&Ox3 ENT: No sinus tenderness, TM clear, pale nasal mucosa, no oral exudate,no post nasal drip, no LAN Cardiac: S1, S2, regular rate and rhythm, no murmur Chest: No wheeze/ rales/ dullness; no accessory muscle use, no nasal flaring, no sternal retractions Abd.: Soft Non-tender Ext: No clubbing cyanosis, edema Neuro:  normal strength Skin: No rashes, warm and dry Psych: normal mood and behavior   Assessment/Plan  No problem-specific Assessment & Plan notes found for this encounter.    Magdalen Spatz, NP 07/31/2021  1:32 PM            History of Present Illness Christy MANCIA is a 86 y.o. female with ***   07/31/2021  Test Results:  CBC Latest Ref Rng & Units 11/06/2018 11/04/2018 03/20/2018  WBC 4.0 - 10.5 K/uL 5.3 CANCELED 10.8(H)  Hemoglobin 12.0 - 15.0 g/dL 14.5 CANCELED 15.9(H)  Hematocrit 36.0 - 46.0 % 43.8 CANCELED 47.9(H)  Platelets 150 - 400 K/uL 226 CANCELED 221    BMP Latest Ref Rng & Units 11/06/2018 11/04/2018 03/20/2018  Glucose 70 - 99 mg/dL 103(H) 93 120(H)  BUN 8 - 23 mg/dL '12 13 14  '$ Creatinine 0.44 - 1.00 mg/dL 0.77 0.90 1.02(H)  BUN/Creat Ratio 12 - 28 - 14 -  Sodium 135 - 145 mmol/L 139 140 135  Potassium 3.5 - 5.1 mmol/L 3.9 5.4(H) 4.2  Chloride 98 - 111 mmol/L 104 101 100(L)  CO2 22 - 32 mmol/L '28 24 24  '$ Calcium 8.9 - 10.3 mg/dL 9.3 10.0 9.6    BNP No results found for: BNP  ProBNP No results found for: PROBNP  PFT    Component Value Date/Time   FEV1PRE 1.92 02/25/2018 1055   FVCPRE 3.15 02/25/2018 1055   TLC 5.53 02/25/2018 1055   DLCOUNC 14.54 02/25/2018 1055   PREFEV1FVCRT 61 02/25/2018 1055    No results found.   Past medical hx Past Medical History:   Diagnosis Date   Adenomatous polyp of colon 03/2007   Allergic rhinitis    Allergy    has a lot congestion   Anxiety    Blood transfusion 1974   Bronchiectasis ([place])    Cancer ([place])    left arm   Depression    Diabetes mellitus without complication ([place])    no meds   Diastolic dysfunction    a. 01/2014 Echo: EF 60-65%, no rwma, Gr1 DD, Ao sclerosis w/o stenosis. Mild TR.   Diverticulosis of colon (without mention of hemorrhage)    DOE (dyspnea on exertion)    Fatigue    Fecal incontinence    GERD (gastroesophageal reflux disease)    Glaucoma    Goiter    right side   Heart murmur    History of stress test    a. 01/2014 Lexi MV: no ischemia/infarct.   Hyperlipidemia    Hypertension    a. 11/2018 Renal artery duplex: No RAS.    Hypothyroidism    IBS (irritable bowel syndrome)    Migraine    Mitral valve prolapse    a. 01/2014 Echo: no MR/MVP noted.   Neck mass    right side/ no airway problems/ per pt has had since 2005 (goiter)   Nonspecific abnormal electrocardiogram (ECG) (EKG)    Palpitations    Palpitations    a. 01/2014 CardioNet: wore for 9 days (rash). Sinus tach - 100's. No SVT/AF.   Polyp, sigmoid colon    Post-operative nausea and vomiting    Pulmonary nodules    Recurrent UTI    Tremor      Social History   Tobacco Use   Smoking status: Never   Smokeless tobacco: Never  Vaping Use   Vaping Use: Never used  Substance Use Topics   Alcohol use: Yes    Comment: rare   Drug use: No    Ms.Handlin reports that she has never smoked. She has never used smokeless tobacco. She reports current alcohol use. She reports that she does not use drugs.  Tobacco Cessation: Counseling given: Not Answered   Past surgical hx, Family hx, Social hx all reviewed.  Current Outpatient Medications on File Prior to Visit  Medication Sig   acetaminophen (TYLENOL) 500 MG tablet Take 500 mg by mouth as needed.   albuterol (PROVENTIL) (2.5 MG/3ML) 0.083% nebulizer solution  Take 3 mLs (2.5 mg total) by nebulization every 6 (six) hours as needed for wheezing or shortness of breath.   amLODipine (NORVASC) 5 MG tablet Take one tablet (5 mg) by mouth daily at dinner time   brimonidine (ALPHAGAN) 0.2 % ophthalmic solution Place 1 drop into both eyes 2 (two) times daily. Place 1 drop into both eyes 2 (two) times daily.   carvedilol (COREG) 12.5 MG tablet Take HALF tablet (6.25 mg) in MORNING, take WHOLE tablet (12.5 mg) in EVENING.   cetirizine (ZYRTEC) 10 MG tablet Take 10 mg by mouth daily.    cyclobenzaprine (FLEXERIL) 10 MG  tablet Take 10 mg by mouth 3 (three) times daily as needed for muscle spasms.   dorzolamide (TRUSOPT) 2 % ophthalmic solution Place 1 drop into both eyes 2 (two) times daily. Place 1 drop into both eyes 2 (two) times daily.   doxycycline (VIBRA-TABS) 100 MG tablet Take 1 tablet (100 mg total) by mouth 2 (two) times daily.   hydrALAZINE (APRESOLINE) 25 MG tablet Take 2 tablets (50 mg total) by mouth 3 (three) times daily as needed (Take as needed for systolic blood pressure > 170.).   levothyroxine (SYNTHROID, LEVOTHROID) 50 MCG tablet Take 50 mcg by mouth daily before breakfast.   LORazepam (ATIVAN) 0.5 MG tablet Take 0.5 mg by mouth 2 (two) times daily as needed (tremors).   losartan (COZAAR) 100 MG tablet Take one tablet (100 mg) by mouth daily at dinner time   Multiple Vitamins-Minerals (MULTIVITAMIN WITH MINERALS) tablet Take 1 tablet by mouth daily.   pantoprazole (PROTONIX) 40 MG tablet Take 1 tablet (40 mg total) by mouth daily before breakfast.   Polyethyl Glycol-Propyl Glycol (SYSTANE OP) Apply to eye daily.   pravastatin (PRAVACHOL) 40 MG tablet Take 1 tablet (40 mg total) by mouth at bedtime.   sertraline (ZOLOFT) 100 MG tablet Take 1 tablet (100 mg total) by mouth at bedtime. 2 at bedtime   sodium chloride (OCEAN) 0.65 % nasal spray Place 2 sprays into both nostrils at bedtime. Use as needed   Tiotropium Bromide-Olodaterol (STIOLTO  RESPIMAT) 2.5-2.5 MCG/ACT AERS Inhale 2 puffs into the lungs daily.   traMADol (ULTRAM) 50 MG tablet Take 50 mg by mouth 2 (two) times daily as needed (pain).    traZODone (DESYREL) 50 MG tablet Take 50 mg by mouth at bedtime.   triamcinolone (NASACORT) 55 MCG/ACT AERO nasal inhaler Place 1 spray into the nose daily.   No current facility-administered medications on file prior to visit.     Allergies  Allergen Reactions   Prednisone     REACTION: Increase eye pressure with gloucoma. Broke out in rash after injection per Dr. Cherlynn Perches [Tape]     Causes blisters/irritates skin   Penicillins     REACTION: Rash   Sulfonamide Derivatives     REACTION: Rash - not sure    Review Of Systems:  Constitutional:   No  weight loss, night sweats,  Fevers, chills, fatigue, or  lassitude.  HEENT:   No headaches,  Difficulty swallowing,  Tooth/dental problems, or  Sore throat,                No sneezing, itching, ear ache, nasal congestion, post nasal drip,   CV:  No chest pain,  Orthopnea, PND, swelling in lower extremities, anasarca, dizziness, palpitations, syncope.   GI  No heartburn, indigestion, abdominal pain, nausea, vomiting, diarrhea, change in bowel habits, loss of appetite, bloody stools.   Resp: No shortness of breath with exertion or at rest.  No excess mucus, no productive cough,  No non-productive cough,  No coughing up of blood.  No change in color of mucus.  No wheezing.  No chest wall deformity  Skin: no rash or lesions.  GU: no dysuria, change in color of urine, no urgency or frequency.  No flank pain, no hematuria   MS:  No joint pain or swelling.  No decreased range of motion.  No back pain.  Psych:  No change in mood or affect. No depression or anxiety.  No memory loss.   Vital Signs BP 112/68 (  BP Location: Right Arm, Patient Position: Sitting, Cuff Size: Normal)   Pulse 78   Temp 98.4 F (36.9 C) (Oral)   Ht '5\' 8"'$  (1.727 m)   Wt 148 lb 3.2 oz (67.2 kg)    SpO2 96%   BMI 22.53 kg/m    Physical Exam:  General- No distress,  A&Ox3 ENT: No sinus tenderness, TM clear, pale nasal mucosa, no oral exudate,no post nasal drip, no LAN Cardiac: S1, S2, regular rate and rhythm, no murmur Chest: No wheeze/ rales/ dullness; no accessory muscle use, no nasal flaring, no sternal retractions Abd.: Soft Non-tender Ext: No clubbing cyanosis, edema Neuro:  normal strength Skin: No rashes, warm and dry Psych: normal mood and behavior   Assessment/Plan  No problem-specific Assessment & Plan notes found for this encounter.    Magdalen Spatz, NP 07/31/2021  10:34 AM

## 2021-07-31 NOTE — Progress Notes (Signed)
History of Present Illness Christy Richardson is a 86 y.o. female  never smoker with bronchiectasis.    07/31/2021 Pt presents for follow up. She is followed by Dr. Valeta Richardson for bronchiectasis. She has had worsening cough and congestion with purulent secretions over the last several weeks. Additionally she has had a respy voice that is new. She was last treated with Doxycycline 06/2021 for her last flare. She states this works " ok" but not great. She denies any fever, but secretions are purulent. She is compliant with all of her therapy. She uses her vest twice daily x 10 minutes, and she is using her albuterol nebs. She is using her flutter valve.  She is also using Tussin for cough.  CXR today shows  bronchitis. It also shows a 2.6 cm nodular opacity projecting over a lower thoracic vertebral body on the lateral projection not present on the prior study. She has not had a sputum Cx, which we will do today. She has allergies to Sulfa and PCN medications which limits treatment options. Sensitivities will help optimize treatment options. She is also unable to take prednisone.   Test Results:  CBC Latest Ref Rng & Units 11/06/2018 11/04/2018 03/20/2018  WBC 4.0 - 10.5 K/uL 5.3 CANCELED 10.8(H)  Hemoglobin 12.0 - 15.0 g/dL 14.5 CANCELED 15.9(H)  Hematocrit 36.0 - 46.0 % 43.8 CANCELED 47.9(H)  Platelets 150 - 400 K/uL 226 CANCELED 221    BMP Latest Ref Rng & Units 11/06/2018 11/04/2018 03/20/2018  Glucose 70 - 99 mg/dL 103(H) 93 120(H)  BUN 8 - 23 mg/dL '12 13 14  '$ Creatinine 0.44 - 1.00 mg/dL 0.77 0.90 1.02(H)  BUN/Creat Ratio 12 - 28 - 14 -  Sodium 135 - 145 mmol/L 139 140 135  Potassium 3.5 - 5.1 mmol/L 3.9 5.4(H) 4.2  Chloride 98 - 111 mmol/L 104 101 100(L)  CO2 22 - 32 mmol/L '28 24 24  '$ Calcium 8.9 - 10.3 mg/dL 9.3 10.0 9.6    BNP No results found for: BNP  ProBNP No results found for: PROBNP  PFT    Component Value Date/Time   FEV1PRE 1.92 02/25/2018 1055   FVCPRE 3.15 02/25/2018 1055    TLC 5.53 02/25/2018 1055   DLCOUNC 14.54 02/25/2018 1055   PREFEV1FVCRT 61 02/25/2018 1055    No results found.   Past medical hx Past Medical History:  Diagnosis Date   Adenomatous polyp of colon 03/2007   Allergic rhinitis    Allergy    has a lot congestion   Anxiety    Blood transfusion 1974   Bronchiectasis ([place])    Cancer ([place])    left arm   Depression    Diabetes mellitus without complication ([place])    no meds   Diastolic dysfunction    a. 01/2014 Echo: EF 60-65%, no rwma, Gr1 DD, Ao sclerosis w/o stenosis. Mild TR.   Diverticulosis of colon (without mention of hemorrhage)    DOE (dyspnea on exertion)    Fatigue    Fecal incontinence    GERD (gastroesophageal reflux disease)    Glaucoma    Goiter    right side   Heart murmur    History of stress test    a. 01/2014 Lexi MV: no ischemia/infarct.   Hyperlipidemia    Hypertension    a. 11/2018 Renal artery duplex: No RAS.    Hypothyroidism    IBS (irritable bowel syndrome)    Migraine    Mitral valve prolapse    a. 01/2014  Echo: no MR/MVP noted.   Neck mass    right side/ no airway problems/ per pt has had since 2005 (goiter)   Nonspecific abnormal electrocardiogram (ECG) (EKG)    Palpitations    Palpitations    a. 01/2014 CardioNet: wore for 9 days (rash). Sinus tach - 100's. No SVT/AF.   Polyp, sigmoid colon    Post-operative nausea and vomiting    Pulmonary nodules    Recurrent UTI    Tremor      Social History   Tobacco Use   Smoking status: Never   Smokeless tobacco: Never  Vaping Use   Vaping Use: Never used  Substance Use Topics   Alcohol use: Yes    Comment: rare   Drug use: No    Ms.Christy Richardson reports that she has never smoked. She has never used smokeless tobacco. She reports current alcohol use. She reports that she does not use drugs.  Tobacco Cessation: Never smoker   Past surgical hx, Family hx, Social hx all reviewed.  Current Outpatient Medications on File Prior to Visit   Medication Sig   acetaminophen (TYLENOL) 500 MG tablet Take 500 mg by mouth as needed.   albuterol (PROVENTIL) (2.5 MG/3ML) 0.083% nebulizer solution Take 3 mLs (2.5 mg total) by nebulization every 6 (six) hours as needed for wheezing or shortness of breath.   amLODipine (NORVASC) 5 MG tablet Take one tablet (5 mg) by mouth daily at dinner time   brimonidine (ALPHAGAN) 0.2 % ophthalmic solution Place 1 drop into both eyes 2 (two) times daily. Place 1 drop into both eyes 2 (two) times daily.   carvedilol (COREG) 12.5 MG tablet Take HALF tablet (6.25 mg) in MORNING, take WHOLE tablet (12.5 mg) in EVENING.   cetirizine (ZYRTEC) 10 MG tablet Take 10 mg by mouth daily.    cyclobenzaprine (FLEXERIL) 10 MG tablet Take 10 mg by mouth 3 (three) times daily as needed for muscle spasms.   dorzolamide (TRUSOPT) 2 % ophthalmic solution Place 1 drop into both eyes 2 (two) times daily. Place 1 drop into both eyes 2 (two) times daily.   doxycycline (VIBRA-TABS) 100 MG tablet Take 1 tablet (100 mg total) by mouth 2 (two) times daily.   hydrALAZINE (APRESOLINE) 25 MG tablet Take 2 tablets (50 mg total) by mouth 3 (three) times daily as needed (Take as needed for systolic blood pressure > 170.).   levothyroxine (SYNTHROID, LEVOTHROID) 50 MCG tablet Take 50 mcg by mouth daily before breakfast.   LORazepam (ATIVAN) 0.5 MG tablet Take 0.5 mg by mouth 2 (two) times daily as needed (tremors).   losartan (COZAAR) 100 MG tablet Take one tablet (100 mg) by mouth daily at dinner time   Multiple Vitamins-Minerals (MULTIVITAMIN WITH MINERALS) tablet Take 1 tablet by mouth daily.   pantoprazole (PROTONIX) 40 MG tablet Take 1 tablet (40 mg total) by mouth daily before breakfast.   Polyethyl Glycol-Propyl Glycol (SYSTANE OP) Apply to eye daily.   pravastatin (PRAVACHOL) 40 MG tablet Take 1 tablet (40 mg total) by mouth at bedtime.   sertraline (ZOLOFT) 100 MG tablet Take 1 tablet (100 mg total) by mouth at bedtime. 2 at bedtime    sodium chloride (OCEAN) 0.65 % nasal spray Place 2 sprays into both nostrils at bedtime. Use as needed   Tiotropium Bromide-Olodaterol (STIOLTO RESPIMAT) 2.5-2.5 MCG/ACT AERS Inhale 2 puffs into the lungs daily.   traMADol (ULTRAM) 50 MG tablet Take 50 mg by mouth 2 (two) times daily as needed (pain).  traZODone (DESYREL) 50 MG tablet Take 50 mg by mouth at bedtime.   triamcinolone (NASACORT) 55 MCG/ACT AERO nasal inhaler Place 1 spray into the nose daily.   No current facility-administered medications on file prior to visit.     Allergies  Allergen Reactions   Prednisone     REACTION: Increase eye pressure with gloucoma. Broke out in rash after injection per Dr. Cherlynn Perches [Tape]     Causes blisters/irritates skin   Penicillins     REACTION: Rash   Sulfonamide Derivatives     REACTION: Rash - not sure    Review Of Systems:  Constitutional:   No  weight loss, night sweats,  Fevers, chills, fatigue, or  lassitude.  HEENT:   No headaches,  Difficulty swallowing,  Tooth/dental problems, or  Sore throat,                No sneezing, itching, ear ache, nasal congestion, post nasal drip,   CV:  No chest pain,  Orthopnea, PND, swelling in lower extremities, anasarca, dizziness, palpitations, syncope.   GI  No heartburn, indigestion, abdominal pain, nausea, vomiting, diarrhea, change in bowel habits, loss of appetite, bloody stools.   Resp: No shortness of breath with exertion or at rest.  + excess mucus, + productive cough,  No non-productive cough,  No coughing up of blood.  + change in color of mucus.  No wheezing.  No chest wall deformity, + Chest congestion  Skin: no rash or lesions.  GU: no dysuria, change in color of urine, no urgency or frequency.  No flank pain, no hematuria   MS:  No joint pain or swelling.  No decreased range of motion.  No back pain.  Psych:  No change in mood or affect. No depression or anxiety.  No memory loss.   Vital Signs BP 112/68 (BP  Location: Right Arm, Patient Position: Sitting, Cuff Size: Normal)   Pulse 78   Temp 98.4 F (36.9 C) (Oral)   Ht '5\' 8"'$  (1.727 m)   Wt 148 lb 3.2 oz (67.2 kg)   SpO2 96%   BMI 22.53 kg/m    Physical Exam:  General- No distress,  A&Ox3, pleasant ENT: No sinus tenderness, TM clear, pale nasal mucosa, no oral exudate,no post nasal drip, no LAN Cardiac: S1, S2, regular rate and rhythm, no murmur Chest: No wheeze/ rales/ dullness; no accessory muscle use, no nasal flaring, no sternal retractions, + congested cough Abd.: Soft Non-tender, ND, BS +, Body mass index is 22.53 kg/m. Ext: No clubbing cyanosis, edema Neuro:  normal strength, MAE x 4, A&O x 3 Skin: No rashes, warm and dry, No lesions  Psych: normal mood and behavior   Assessment/Plan  Bronchiectasis Flare Plan CXR today shows bronchitis.  We will check sputum culture today. ( Sputum for Cx, Fungal and AFB) Please provide patient with culture cups.  We will treat you with doxycycline . Due to your Sulfa and Penicillin allergies, this is our best option until we get sputum cultures back.  Take 1 tablet twice daily x 10 days. Continue using chest very 3 times daily x 10 minutes for the next week.  Flutter valve  with every commercial. Continue Mucinex 1200 mg daily with a full glass of water. Please drink plenty of water to thin out your secretions.  Follow up in 2 weeks, hopefully we will have culture results back, with [name] NP  New pulmonary Nodule noted on CXR ? Inflammatory with flare Plan  CT Chest without contrast in 3 weeks to evaluate the nodule noted on the CXR. [facility]) \ Call if you need [place] sooner than the 2 weeks.  Will follow up with Dr. Valeta Richardson after CT Chest so he can evaluate.  Please contact office for sooner follow up if symptoms do not improve or worsen or seek emergency care    I spent 40 minutes dedicated to the care of this patient on the date of this encounter to include pre-visit review of  records, face-to-face time with the patient discussing conditions above, post visit ordering of testing, clinical documentation with the electronic health record, making appropriate referrals as documented, and communicating necessary information to the patient's healthcare team.   Magdalen Spatz, NP 07/31/2021  10:37 AM

## 2021-08-01 NOTE — Telephone Encounter (Signed)
Attempted to reach out to pt's daughter Joelene Millin [facility] approved), unable to make contact. LDM on cell VM, advised unable to see labs from PCP, also would talk to Mrs. Gerarda Fraction PCP, Dr. Nevada Crane regarding need for referral for nephrology.  Advised to call back with any further concerns or questions.

## 2021-08-10 ENCOUNTER — Telehealth: Payer: Self-pay | Admitting: Acute Care

## 2021-08-10 DIAGNOSIS — R911 Solitary pulmonary nodule: Secondary | ICD-10-CM

## 2021-08-10 NOTE — Progress Notes (Signed)
These results were discussed with the patient at her appointment 07/31/2021. She verbalized understanding

## 2021-08-10 NOTE — Telephone Encounter (Signed)
Can you make sure this CT gets scheduled? I have ordered it for 2 weeks. She will need a follow up appointment  after the scan. It can be a virtual appointment if she is doing well. If she would rather come in that is fine  Thanks

## 2021-08-11 NOTE — Telephone Encounter (Signed)
Ct scheduled for 08/31/21 arrive at 11:45 that was the first opening they had at [facility] I will call the patient with the ct appt

## 2021-08-16 ENCOUNTER — Encounter: Payer: Self-pay | Admitting: Acute Care

## 2021-08-16 ENCOUNTER — Ambulatory Visit: Payer: PPO | Admitting: Acute Care

## 2021-08-16 ENCOUNTER — Other Ambulatory Visit: Payer: Self-pay

## 2021-08-16 VITALS — BP 122/72 | HR 73 | Temp 97.8°F | Ht 68.0 in | Wt 149.8 lb

## 2021-08-16 DIAGNOSIS — J471 Bronchiectasis with (acute) exacerbation: Secondary | ICD-10-CM | POA: Diagnosis not present

## 2021-08-16 MED ORDER — BREZTRI AEROSPHERE 160-9-4.8 MCG/ACT IN AERO: 2.0000 | INHALATION_SPRAY | Freq: Two times a day (BID) | RESPIRATORY_TRACT | 0 refills | Status: DC

## 2021-08-16 NOTE — Progress Notes (Signed)
History of Present Illness Christy Richardson is a 86 y.o. female never smoker with bronchiectasis. She is followed by Dr.Icard .    08/22/2021 Pt. Presents for follow up. She states she feels better after the antibiotics. She has noted that she has fewer secretions, but she does still cough up a good amount. The sputum she collected and brough into the office does not show in [place]. She did not put her name on it , and the sputum most likely has been discarded. We will need a recollect for culture.  She is using her flutter valve and she is using her Mucinex. She is using her vest twice daily. Cough is better. No wheezing. She feels she is at her baseline. No fever, or chest pain.  Test Results: CXR 07/31/2021 Mild central peribronchial cuffing and perihilar opacities suggesting bronchitis in the correct clinical setting. 2. 2.6 cm nodular opacity projecting over a lower thoracic vertebral body on the lateral projection not present on the prior study. Recommend nonemergent CT chest to evaluate for parenchymal nodule.    CBC Latest Ref Rng & Units 11/06/2018 11/04/2018 03/20/2018  WBC 4.0 - 10.5 K/uL 5.3 CANCELED 10.8(H)  Hemoglobin 12.0 - 15.0 g/dL 14.5 CANCELED 15.9(H)  Hematocrit 36.0 - 46.0 % 43.8 CANCELED 47.9(H)  Platelets 150 - 400 K/uL 226 CANCELED 221    BMP Latest Ref Rng & Units 11/06/2018 11/04/2018 03/20/2018  Glucose 70 - 99 mg/dL 103(H) 93 120(H)  BUN 8 - 23 mg/dL '12 13 14  '$ Creatinine 0.44 - 1.00 mg/dL 0.77 0.90 1.02(H)  BUN/Creat Ratio 12 - 28 - 14 -  Sodium 135 - 145 mmol/L 139 140 135  Potassium 3.5 - 5.1 mmol/L 3.9 5.4(H) 4.2  Chloride 98 - 111 mmol/L 104 101 100(L)  CO2 22 - 32 mmol/L '28 24 24  '$ Calcium 8.9 - 10.3 mg/dL 9.3 10.0 9.6    BNP No results found for: BNP  ProBNP No results found for: PROBNP  PFT    Component Value Date/Time   FEV1PRE 1.92 02/25/2018 1055   FVCPRE 3.15 02/25/2018 1055   TLC 5.53 02/25/2018 1055   DLCOUNC 14.54 02/25/2018 1055    PREFEV1FVCRT 61 02/25/2018 1055    DG Chest 2 View  Result Date: 07/31/2021 CLINICAL DATA:  Cough, bronchiectasis EXAM: CHEST - 2 VIEW COMPARISON:  Chest radiograph 12/10/2018 FINDINGS: The cardiomediastinal silhouette is within normal limits. There is calcified atherosclerotic plaque of the aortic arch. There is unchanged asymmetric elevation of the right hemidiaphragm. There is mild central peribronchial cuffing with increased perihilar opacity. There is a 2.6 cm density projecting over a lower thoracic vertebral body on the lateral projection not present on the prior study. There is blunting of the left costophrenic angle, similar to the prior study and favored to reflect scar. There is no significant pleural effusion. There is no pneumothorax. There is no acute osseous abnormality. IMPRESSION: 1. Mild central peribronchial cuffing and perihilar opacities suggesting bronchitis in the correct clinical setting. 2. 2.6 cm nodular opacity projecting over a lower thoracic vertebral body on the lateral projection not present on the prior study. Recommend nonemergent CT chest to evaluate for parenchymal nodule. Electronically Signed   By: Valetta Mole M.D.   On: 07/31/2021 10:36     Past medical hx Past Medical History:  Diagnosis Date   Adenomatous polyp of colon 03/2007   Allergic rhinitis    Allergy    has a lot congestion   Anxiety    Blood  transfusion 1974   Bronchiectasis ([place])    Cancer ([place])    left arm   Depression    Diabetes mellitus without complication ([place])    no meds   Diastolic dysfunction    a. 01/2014 Echo: EF 60-65%, no rwma, Gr1 DD, Ao sclerosis w/o stenosis. Mild TR.   Diverticulosis of colon (without mention of hemorrhage)    DOE (dyspnea on exertion)    Fatigue    Fecal incontinence    GERD (gastroesophageal reflux disease)    Glaucoma    Goiter    right side   Heart murmur    History of stress test    a. 01/2014 Lexi MV: no ischemia/infarct.   Hyperlipidemia     Hypertension    a. 11/2018 Renal artery duplex: No RAS.    Hypothyroidism    IBS (irritable bowel syndrome)    Migraine    Mitral valve prolapse    a. 01/2014 Echo: no MR/MVP noted.   Neck mass    right side/ no airway problems/ per pt has had since 2005 (goiter)   Nonspecific abnormal electrocardiogram (ECG) (EKG)    Palpitations    Palpitations    a. 01/2014 CardioNet: wore for 9 days (rash). Sinus tach - 100's. No SVT/AF.   Polyp, sigmoid colon    Post-operative nausea and vomiting    Pulmonary nodules    Recurrent UTI    Tremor      Social History   Tobacco Use   Smoking status: Never   Smokeless tobacco: Never  Vaping Use   Vaping Use: Never used  Substance Use Topics   Alcohol use: Yes    Comment: rare   Drug use: No    Ms.Wannamaker reports that she has never smoked. She has never used smokeless tobacco. She reports current alcohol use. She reports that she does not use drugs.  Tobacco Cessation: Never smoker   Past surgical hx, Family hx, Social hx all reviewed.  Current Outpatient Medications on File Prior to Visit  Medication Sig   acetaminophen (TYLENOL) 500 MG tablet Take 500 mg by mouth as needed.   albuterol (PROVENTIL) (2.5 MG/3ML) 0.083% nebulizer solution Take 3 mLs (2.5 mg total) by nebulization every 6 (six) hours as needed for wheezing or shortness of breath.   amLODipine (NORVASC) 5 MG tablet Take one tablet (5 mg) by mouth daily at dinner time   brimonidine (ALPHAGAN) 0.2 % ophthalmic solution Place 1 drop into both eyes 2 (two) times daily. Place 1 drop into both eyes 2 (two) times daily.   carvedilol (COREG) 12.5 MG tablet Take HALF tablet (6.25 mg) in MORNING, take WHOLE tablet (12.5 mg) in EVENING.   cetirizine (ZYRTEC) 10 MG tablet Take 10 mg by mouth daily.    cyclobenzaprine (FLEXERIL) 10 MG tablet Take 10 mg by mouth 3 (three) times daily as needed for muscle spasms.   dorzolamide (TRUSOPT) 2 % ophthalmic solution Place 1 drop into both eyes 2  (two) times daily. Place 1 drop into both eyes 2 (two) times daily.   hydrALAZINE (APRESOLINE) 25 MG tablet Take 2 tablets (50 mg total) by mouth 3 (three) times daily as needed (Take as needed for systolic blood pressure > 170.).   levothyroxine (SYNTHROID, LEVOTHROID) 50 MCG tablet Take 50 mcg by mouth daily before breakfast.   LORazepam (ATIVAN) 0.5 MG tablet Take 0.5 mg by mouth 2 (two) times daily as needed (tremors).   losartan (COZAAR) 100 MG tablet Take one tablet (100  mg) by mouth daily at dinner time   Multiple Vitamins-Minerals (MULTIVITAMIN WITH MINERALS) tablet Take 1 tablet by mouth daily.   pantoprazole (PROTONIX) 40 MG tablet Take 1 tablet (40 mg total) by mouth daily before breakfast.   Polyethyl Glycol-Propyl Glycol (SYSTANE OP) Apply to eye daily.   pravastatin (PRAVACHOL) 40 MG tablet Take 1 tablet (40 mg total) by mouth at bedtime.   sertraline (ZOLOFT) 100 MG tablet Take 1 tablet (100 mg total) by mouth at bedtime. 2 at bedtime   sodium chloride (OCEAN) 0.65 % nasal spray Place 2 sprays into both nostrils at bedtime. Use as needed   Tiotropium Bromide-Olodaterol (STIOLTO RESPIMAT) 2.5-2.5 MCG/ACT AERS Inhale 2 puffs into the lungs daily.   traMADol (ULTRAM) 50 MG tablet Take 50 mg by mouth 2 (two) times daily as needed (pain).    traZODone (DESYREL) 50 MG tablet Take 50 mg by mouth at bedtime.   triamcinolone (NASACORT) 55 MCG/ACT AERO nasal inhaler Place 1 spray into the nose daily.   No current facility-administered medications on file prior to visit.     Allergies  Allergen Reactions   Prednisone     REACTION: Increase eye pressure with gloucoma. Broke out in rash after injection per Dr. Cherlynn Perches [Tape]     Causes blisters/irritates skin   Penicillins     REACTION: Rash   Sulfonamide Derivatives     REACTION: Rash - not sure    Review Of Systems:  Constitutional:   No  weight loss, night sweats,  Fevers, chills, fatigue, or  lassitude.  HEENT:   No  headaches,  Difficulty swallowing,  Tooth/dental problems, or  Sore throat,                No sneezing, itching, ear ache, nasal congestion, post nasal drip,   CV:  No chest pain,  Orthopnea, PND, swelling in lower extremities, anasarca, dizziness, palpitations, syncope.   GI  No heartburn, indigestion, abdominal pain, nausea, vomiting, diarrhea, change in bowel habits, loss of appetite, bloody stools.   Resp: No shortness of breath with exertion or at rest.  No excess mucus, no productive cough,  No non-productive cough,  No coughing up of blood.  No change in color of mucus.  No wheezing.  No chest wall deformity  Skin: no rash or lesions.  GU: no dysuria, change in color of urine, no urgency or frequency.  No flank pain, no hematuria   MS:  No joint pain or swelling.  No decreased range of motion.  No back pain.  Psych:  No change in mood or affect. No depression or anxiety.  No memory loss.   Vital Signs BP 122/72 (BP Location: Right Arm, Cuff Size: Normal)   Pulse 73   Temp 97.8 F (36.6 C) (Oral)   Ht '5\' 8"'$  (1.727 m)   Wt 149 lb 12.8 oz (67.9 kg)   SpO2 97%   BMI 22.78 kg/m    Physical Exam:  General- No distress,  A&Ox3, pleasant  ENT: No sinus tenderness, TM clear, pale nasal mucosa, no oral exudate,no post nasal drip, no LAN Cardiac: S1, S2, regular rate and rhythm, no murmur Chest: No wheeze/ rales/ dullness; no accessory muscle use, no nasal flaring, no sternal retractions, few rhonchi , diminished per bases Abd.: Soft Non-tender, ND, BS +, Body mass index is 22.78 kg/m. Ext: No clubbing cyanosis, edema Neuro:  normal strength, MAE x 4, A&O x 3 Skin: No rashes, warm and dry, no  lesions Psych: normal mood and behavior   Assessment/Plan  Bronchiectasis with Acute exacerbation  Plan Your CT is scheduled for 08/31/2021 We will give you sputum sample containers with a name label on them. Continue using your Flutter valve , Mucinex and chest vest as you have been  doing. Continue albuterol nebs twice daily as you have been doing. Video visit to follow up after CT Chest, Number to call is [phone] Look into the [place] at your local [employer]. Sips of water instead of throat clearing Sugar Free [employer] or [employer] originals for throat soothing. Delsym Cough syrup 5 cc's every 12 hours We will try Breztri in place of Stialto Use 2 puffs twice daily Rinse mouth after use  Follow up with Primary Care and cardiology Please contact office for sooner follow up if symptoms do not improve or worsen or seek emergency care    I spent 40 minutes dedicated to the care of this patient on the date of this encounter to include pre-visit review of records, face-to-face time with the patient discussing conditions above, post visit ordering of testing, clinical documentation with the electronic health record, making appropriate referrals as documented, and communicating necessary information to the patient's healthcare team.   Magdalen Spatz, NP 08/22/2021  3:51 PM

## 2021-08-16 NOTE — Patient Instructions (Addendum)
It is good to see you today Your CT is scheduled for 08/31/2021 We will give you sputum sample containers with a name label on them. Continue using your Flutter valve , Mucinex and chest vest as you have been doing. Continue albuterol nebs twice daily as you have been doing. Video visit to follow up after CT Chest, Number to call is [phone] Look into the [place] at your local [employer]. Sips of water instead of throat clearing Sugar Free [employer] or [employer] originals for throat soothing. Delsym Cough syrup 5 cc's every 12 hours We will try Breztri in place of Stialto Use 2 puffs twice daily Rinse mouth after use  Follow up with Primary Care and cardiology Please contact office for sooner follow up if symptoms do not improve or worsen or seek emergency care

## 2021-08-21 ENCOUNTER — Other Ambulatory Visit (HOSPITAL_COMMUNITY)
Admission: RE | Admit: 2021-08-21 | Discharge: 2021-08-21 | Disposition: A | Discharge status: Home / Self Care | Payer: PPO | Source: Ambulatory Visit | Attending: Acute Care | Admitting: Acute Care

## 2021-08-21 DIAGNOSIS — J471 Bronchiectasis with (acute) exacerbation: Secondary | ICD-10-CM | POA: Diagnosis not present

## 2021-08-21 LAB — EXPECTORATED SPUTUM ASSESSMENT W GRAM STAIN, RFLX TO RESP C

## 2021-08-22 ENCOUNTER — Encounter: Payer: Self-pay | Admitting: Acute Care

## 2021-08-24 LAB — CULTURE, RESPIRATORY W GRAM STAIN: Culture: NORMAL

## 2021-08-31 ENCOUNTER — Other Ambulatory Visit: Payer: Self-pay

## 2021-08-31 ENCOUNTER — Ambulatory Visit (HOSPITAL_COMMUNITY)
Admission: RE | Admit: 2021-08-31 | Discharge: 2021-08-31 | Disposition: A | Discharge status: Home / Self Care | Payer: PPO | Source: Ambulatory Visit | Attending: Acute Care | Admitting: Acute Care

## 2021-08-31 DIAGNOSIS — R918 Other nonspecific abnormal finding of lung field: Secondary | ICD-10-CM | POA: Diagnosis not present

## 2021-08-31 DIAGNOSIS — R911 Solitary pulmonary nodule: Secondary | ICD-10-CM | POA: Insufficient documentation

## 2021-08-31 DIAGNOSIS — I700 Atherosclerosis of aorta: Secondary | ICD-10-CM | POA: Diagnosis not present

## 2021-09-01 ENCOUNTER — Telehealth: Payer: Self-pay | Admitting: Acute Care

## 2021-09-01 NOTE — Telephone Encounter (Signed)
Called and spoke with patient. She verbalized understanding of culture results. She is aware that she will get another call once the CT scans are available.   Nothing further needed at time of call.

## 2021-09-01 NOTE — Telephone Encounter (Signed)
Called and spoke with patient. She was calling to check on the status of the sputum culture from 08/21/21. She stated that she turned in the sputum at [facility]. I looked at her chart and see that the results came back as normal, patient is not aware of results since SG has not reviewed them.  I advised her that I would send a message over to [place].   While on the phone, she also wanted to let SG that she completed her CT yesterday. I advised her that the radiologist has not reviewed her scan yet but once they do, we will call her with the results. She verbalized understanding.   SG, can you please advise about the sputum culture results. Thanks!

## 2021-09-06 ENCOUNTER — Telehealth: Payer: Self-pay | Admitting: Acute Care

## 2021-09-06 NOTE — Telephone Encounter (Signed)
Called and spoke with patient. She stated that she completed her CT scan on 08/31/21 and was concerned that [facility] had not sent over the results. I advised her that we are on the same system as [facility] but when she called last week, the radiologist had not reviewed her scan. I did confirm today that the radiologist did read the scan on 09/01/21.   [name], can you please advise about her CT scan results? Thanks!

## 2021-09-06 NOTE — Telephone Encounter (Signed)
Called patient but she did not answer. Left message for her to call back.  

## 2021-09-07 NOTE — Telephone Encounter (Signed)
Call made to patient, confirmed DOB. Made aware of CT results per SG. Voiced understanding. She does report she is feeling better and was very thankful for our help.   Nothing further needed at this time.

## 2021-09-19 DIAGNOSIS — H903 Sensorineural hearing loss, bilateral: Secondary | ICD-10-CM | POA: Diagnosis not present

## 2021-09-19 DIAGNOSIS — H6980 Other specified disorders of Eustachian tube, unspecified ear: Secondary | ICD-10-CM | POA: Diagnosis not present

## 2021-09-19 DIAGNOSIS — H7011 Chronic mastoiditis, right ear: Secondary | ICD-10-CM | POA: Diagnosis not present

## 2021-09-26 DIAGNOSIS — Z23 Encounter for immunization: Secondary | ICD-10-CM | POA: Diagnosis not present

## 2021-09-26 DIAGNOSIS — N390 Urinary tract infection, site not specified: Secondary | ICD-10-CM | POA: Diagnosis not present

## 2021-10-02 DIAGNOSIS — E7800 Pure hypercholesterolemia, unspecified: Secondary | ICD-10-CM | POA: Diagnosis not present

## 2021-10-02 DIAGNOSIS — I10 Essential (primary) hypertension: Secondary | ICD-10-CM | POA: Diagnosis not present

## 2021-10-10 ENCOUNTER — Other Ambulatory Visit: Payer: Self-pay | Admitting: Cardiovascular Disease

## 2021-10-18 DIAGNOSIS — H401132 Primary open-angle glaucoma, bilateral, moderate stage: Secondary | ICD-10-CM | POA: Diagnosis not present

## 2021-10-23 ENCOUNTER — Other Ambulatory Visit: Payer: Self-pay

## 2021-10-23 ENCOUNTER — Encounter: Payer: Self-pay | Admitting: Pulmonary Disease

## 2021-10-23 ENCOUNTER — Ambulatory Visit: Payer: PPO | Admitting: Pulmonary Disease

## 2021-10-23 VITALS — BP 142/84 | HR 74 | Temp 97.7°F | Ht 68.0 in | Wt 152.6 lb

## 2021-10-23 DIAGNOSIS — J479 Bronchiectasis, uncomplicated: Secondary | ICD-10-CM | POA: Diagnosis not present

## 2021-10-23 DIAGNOSIS — R911 Solitary pulmonary nodule: Secondary | ICD-10-CM

## 2021-10-23 DIAGNOSIS — R0609 Other forms of dyspnea: Secondary | ICD-10-CM

## 2021-10-23 DIAGNOSIS — R058 Other specified cough: Secondary | ICD-10-CM

## 2021-10-23 NOTE — Patient Instructions (Signed)
Thank you for visiting Dr. Valeta Harms at [facility]. Today we recommend the following:  Continue stiolto  Continue albuterol as needed   Return in about 1 year (around 10/23/2022) for with Eric Form, NP, or Dr. Valeta Harms.    Please do your part to reduce the spread of COVID-19.

## 2021-10-23 NOTE — Progress Notes (Signed)
Synopsis: Referred in December 2020 for former patient Dr. Lake Bells, PCP: Celene Squibb, MD  Subjective:   PATIENT ID: Christy Richardson GENDER: female DOB: [birth year], MRN: [id number]  Chief Complaint  Patient presents with   Follow-up    Last seen by Eric Form for bronchiectasis flare.  Followed by Dr. Lake Bells for bronchiectasis and fibrosis.  Patient with recent CT scan of the chest in November with bilateral waxing and waning tree-in-bud nodularity small pulmonary nodules 5 mm in size.  Last seen by Dr. Lake Bells in May 2019.  She had childhood pertussis.  She has been doing pulmonary rehabilitation up until Covid.  Currently managed with flutter valve.  She is also using Advair.  OV 11/11/2019: Here today to establish care with new primary pulmonary provider.  Patient doing well today.  She still does have daily sputum production.  She has been using her flutter valve.  Most recently has not used it daily but if she remembers to she will use it 2-3 times a day.  She has no one at home to help her with postural drainage or CPT.  She was unable to obtain sputum cultures recently.  No fevers chills night sweats weight loss.  No hemoptysis recently.  During her last exacerbation she did have a few streaks of blood.  OV 03/17/2019: Patient with bronchiectasis.  Here with ongoing daily sputum production.  She does feels that the Stiolto is helping however she is unable to qualify for financial assistance and the medication is too expensive for her to continue on a regular basis.  She was also unable to afford vest therapy.  Insurance stated they would cover it however was still on a charge for $130 a month and she could not afford this.  She still deals with daily cough and sputum production.  She does not usually routinely use her nebulizer.  Mainly because she does not like dragging all of the equipment out.  Patient denies fevers night sweats weight loss.  Patient denies hemoptysis  OV 10/03/2020:  follow up for bronchiectasis.  She has been using her flutter valve as well as vest therapy regularly.  She is seeing significant improvement in her dyspnea and shortness of breath with use of Stiolto inhaler.  She denies hemoptysis.  Otherwise have been doing well.  She does still hate the daily sputum production.  She is concerned about losing her Stiolto due to cost.  So she did bring paperwork in for financial assistance from [place] 10/23/2021: Here today for follow-up regarding bronchiectasis.  Doing well with flutter valve.  Rarely using vest.  Uses Stiolto.  Was put on Breztri for short period but like the Stiolto so she went back.  Also currently using paperwork for financial aid assistance through [place].  She is able to complete most activities of daily living but she does notice that she is slower getting around than before.    Past Medical History:  Diagnosis Date   Adenomatous polyp of colon 03/2007   Allergic rhinitis    Allergy    has a lot congestion   Anxiety    Blood transfusion 1974   Bronchiectasis ([place])    Cancer ([place])    left arm   Depression    Diabetes mellitus without complication ([place])    no meds   Diastolic dysfunction    a. 01/2014 Echo: EF 60-65%, no rwma, Gr1 DD, Ao sclerosis w/o stenosis. Mild TR.   Diverticulosis of colon (without mention  of hemorrhage)    DOE (dyspnea on exertion)    Fatigue    Fecal incontinence    GERD (gastroesophageal reflux disease)    Glaucoma    Goiter    right side   Heart murmur    History of stress test    a. 01/2014 Lexi MV: no ischemia/infarct.   Hyperlipidemia    Hypertension    a. 11/2018 Renal artery duplex: No RAS.    Hypothyroidism    IBS (irritable bowel syndrome)    Migraine    Mitral valve prolapse    a. 01/2014 Echo: no MR/MVP noted.   Neck mass    right side/ no airway problems/ per pt has had since 2005 (goiter)   Nonspecific abnormal electrocardiogram (ECG) (EKG)    Palpitations    Palpitations    a.  01/2014 CardioNet: wore for 9 days (rash). Sinus tach - 100's. No SVT/AF.   Polyp, sigmoid colon    Post-operative nausea and vomiting    Pulmonary nodules    Recurrent UTI    Tremor      Family History  Problem Relation Age of Onset   Diabetes Father    Throat cancer Mother    Coronary artery disease Mother    Prostate cancer Brother    Asthma Daughter    Scoliosis Daughter    Colon cancer Paternal Grandfather    Esophageal cancer Neg Hx    Rectal cancer Neg Hx    Stomach cancer Neg Hx      Past Surgical History:  Procedure Laterality Date   ABDOMINAL HYSTERECTOMY     CARDIOVASCULAR STRESS TEST  09/19/2011   No scintigraphic evidence of inducible myocardial ischemia. Pharmacological stress test without chest pain or EKG changes for ischemia.   COLONOSCOPY     ESOPHAGOGASTRODUODENOSCOPY     MASTOIDECTOMY     rt ear/ as a teenager   OOPHORECTOMY     SKIN CANCER EXCISION Left    arm   TOTAL HIP ARTHROPLASTY  5/10   left   TRANSTHORACIC ECHOCARDIOGRAM  09/19/2011   EF >15%, stage 1 diastolic dysfunction, mild tricuspid valve regurg    Social History   Socioeconomic History   Marital status: Widowed    Spouse name: Not on file   Number of children: 2   Years of education: 10th grade   Highest education level: Not on file  Occupational History   Occupation: Retired    Comment: Retail  Tobacco Use   Smoking status: Never   Smokeless tobacco: Never  Vaping Use   Vaping Use: Never used  Substance and Sexual Activity   Alcohol use: Yes    Comment: rare   Drug use: No   Sexual activity: Not on file  Other Topics Concern   Not on file  Social History Narrative   Widowed since 2002. Lives with her Daughter.   Social Determinants of Health   Financial Resource Strain: Not on file  Food Insecurity: Not on file  Transportation Needs: Not on file  Physical Activity: Not on file  Stress: Not on file  Social Connections: Not on file  Intimate Partner Violence:  Not on file     Allergies  Allergen Reactions   Prednisone     REACTION: Increase eye pressure with gloucoma. Broke out in rash after injection per Dr. Cherlynn Perches [Tape]     Causes blisters/irritates skin   Penicillins     REACTION: Rash   Sulfonamide Derivatives  REACTION: Rash - not sure     Outpatient Medications Prior to Visit  Medication Sig Dispense Refill   acetaminophen (TYLENOL) 500 MG tablet Take 500 mg by mouth as needed.     albuterol (PROVENTIL) (2.5 MG/3ML) 0.083% nebulizer solution Take 3 mLs (2.5 mg total) by nebulization every 6 (six) hours as needed for wheezing or shortness of breath. 75 mL 12   amLODipine (NORVASC) 5 MG tablet Take one tablet (5 mg) by mouth daily at dinner time 90 tablet 3   brimonidine (ALPHAGAN) 0.2 % ophthalmic solution Place 1 drop into both eyes 2 (two) times daily. Place 1 drop into both eyes 2 (two) times daily.     Budeson-Glycopyrrol-Formoterol (BREZTRI AEROSPHERE) 160-9-4.8 MCG/ACT AERO Inhale 2 puffs into the lungs in the morning and at bedtime. 5.9 g 0   carvedilol (COREG) 12.5 MG tablet Take HALF tablet (6.25 mg) in MORNING, take WHOLE tablet (12.5 mg) in EVENING. 180 tablet 2   cetirizine (ZYRTEC) 10 MG tablet Take 10 mg by mouth daily.      cyclobenzaprine (FLEXERIL) 10 MG tablet Take 10 mg by mouth 3 (three) times daily as needed for muscle spasms.     dorzolamide (TRUSOPT) 2 % ophthalmic solution Place 1 drop into both eyes 2 (two) times daily. Place 1 drop into both eyes 2 (two) times daily.     hydrALAZINE (APRESOLINE) 25 MG tablet Take 2 tablets (50 mg total) by mouth 3 (three) times daily as needed (Take as needed for systolic blood pressure > 170.). 60 tablet 3   levothyroxine (SYNTHROID, LEVOTHROID) 50 MCG tablet Take 50 mcg by mouth daily before breakfast.     LORazepam (ATIVAN) 0.5 MG tablet Take 0.5 mg by mouth 2 (two) times daily as needed (tremors).     losartan (COZAAR) 100 MG tablet TAKE ONE TABLET BY MOUTH  ONCE DAILY. 90 tablet 2   Multiple Vitamins-Minerals (MULTIVITAMIN WITH MINERALS) tablet Take 1 tablet by mouth daily.     pantoprazole (PROTONIX) 40 MG tablet Take 1 tablet (40 mg total) by mouth daily before breakfast. 90 tablet 3   Polyethyl Glycol-Propyl Glycol (SYSTANE OP) Apply to eye daily.     pravastatin (PRAVACHOL) 40 MG tablet Take 1 tablet (40 mg total) by mouth at bedtime.     sertraline (ZOLOFT) 100 MG tablet Take 1 tablet (100 mg total) by mouth at bedtime. 2 at bedtime     sodium chloride (OCEAN) 0.65 % nasal spray Place 2 sprays into both nostrils at bedtime. Use as needed     Tiotropium Bromide-Olodaterol (STIOLTO RESPIMAT) 2.5-2.5 MCG/ACT AERS Inhale 2 puffs into the lungs daily. 4 g 0   traMADol (ULTRAM) 50 MG tablet Take 50 mg by mouth 2 (two) times daily as needed (pain).      traZODone (DESYREL) 50 MG tablet Take 50 mg by mouth at bedtime.     triamcinolone (NASACORT) 55 MCG/ACT AERO nasal inhaler Place 1 spray into the nose daily.     No facility-administered medications prior to visit.    Review of Systems  Constitutional:  Negative for chills, fever, malaise/fatigue and weight loss.  HENT:  Negative for hearing loss, sore throat and tinnitus.   Eyes:  Negative for blurred vision and double vision.  Respiratory:  Positive for cough and sputum production. Negative for hemoptysis, shortness of breath, wheezing and stridor.   Cardiovascular:  Negative for chest pain, palpitations, orthopnea, leg swelling and PND.  Gastrointestinal:  Negative for abdominal pain, constipation, diarrhea,  heartburn, nausea and vomiting.  Genitourinary:  Negative for dysuria, hematuria and urgency.  Musculoskeletal:  Negative for joint pain and myalgias.  Skin:  Negative for itching and rash.  Neurological:  Negative for dizziness, tingling, weakness and headaches.  Endo/Heme/Allergies:  Negative for environmental allergies. Does not bruise/bleed easily.  Psychiatric/Behavioral:  Negative  for depression. The patient is not nervous/anxious and does not have insomnia.   All other systems reviewed and are negative.   Objective:  Physical Exam Vitals reviewed.  Constitutional:      General: She is not in acute distress.    Appearance: She is well-developed.  HENT:     Head: Normocephalic and atraumatic.  Eyes:     General: No scleral icterus.    Conjunctiva/sclera: Conjunctivae normal.     Pupils: Pupils are equal, round, and reactive to light.  Neck:     Vascular: No JVD.     Trachea: No tracheal deviation.  Cardiovascular:     Rate and Rhythm: Normal rate and regular rhythm.     Heart sounds: Normal heart sounds. No murmur heard. Pulmonary:     Effort: Pulmonary effort is normal. No tachypnea, accessory muscle usage or respiratory distress.     Breath sounds: No stridor. No wheezing, rhonchi or rales.     Comments: Bronchial breath sounds bilaterally Abdominal:     General: Bowel sounds are normal. There is no distension.     Palpations: Abdomen is soft.     Tenderness: There is no abdominal tenderness.  Musculoskeletal:        General: No tenderness.     Cervical back: Neck supple.  Lymphadenopathy:     Cervical: No cervical adenopathy.  Skin:    General: Skin is warm and dry.     Capillary Refill: Capillary refill takes less than 2 seconds.     Findings: No rash.  Neurological:     Mental Status: She is alert and oriented to person, place, and time.  Psychiatric:        Behavior: Behavior normal.     Vitals:   10/23/21 1359  BP: (!) 142/84  Pulse: 74  Temp: 97.7 F (36.5 C)  TempSrc: Oral  SpO2: 97%  Weight: 152 lb 9.6 oz (69.2 kg)  Height: 5\' 8"  (1.727 m)   97% on RA BMI Readings from Last 3 Encounters:  10/23/21 23.20 kg/m  08/16/21 22.78 kg/m  07/31/21 22.53 kg/m   Wt Readings from Last 3 Encounters:  10/23/21 152 lb 9.6 oz (69.2 kg)  08/16/21 149 lb 12.8 oz (67.9 kg)  07/31/21 148 lb 3.2 oz (67.2 kg)     CBC    Component  Value Date/Time   WBC 5.3 11/06/2018 0754   RBC 4.87 11/06/2018 0754   HGB 14.5 11/06/2018 0754   HGB CANCELED 11/04/2018 1114   HCT 43.8 11/06/2018 0754   HCT CANCELED 11/04/2018 1114   PLT 226 11/06/2018 0754   PLT CANCELED 11/04/2018 1114   MCV 89.9 11/06/2018 0754   MCH 29.8 11/06/2018 0754   MCHC 33.1 11/06/2018 0754   RDW 12.7 11/06/2018 0754   LYMPHSABS 1.1 11/06/2018 0754   LYMPHSABS CANCELED 11/04/2018 1114   MONOABS 0.3 11/06/2018 0754   EOSABS 0.2 11/06/2018 0754   EOSABS CANCELED 11/04/2018 1114   BASOSABS 0.0 11/06/2018 0754   BASOSABS CANCELED 11/04/2018 1114      Chest Imaging: November 2020: CT chest Bronchiectasis scattered tree-in-bud nodularities. The patient's images have been independently reviewed by me.  08/31/2021: 6 mm lower lobe lung nodule stable. 2-year documented stability Small areas of bronchiectasis and tree-in-bud. The patient's images have been independently reviewed by me.    Pulmonary Functions Testing Results: PFT Results Latest Ref Rng & Units 02/25/2018  FVC-Pre L 3.15  FVC-Predicted Pre % 106  Pre FEV1/FVC % % 61  FEV1-Pre L 1.92  FEV1-Predicted Pre % 86  DLCO uncorrected ml/min/mmHg 14.54  DLCO UNC% % 51  DLVA Predicted % 58  TLC L 5.53  TLC % Predicted % 100  RV % Predicted % 86    FeNO: None   Pathology: None   Echocardiogram: None   Heart Catheterization: None     Assessment & Plan:     ICD-10-CM   1. Bronchiectasis without complication ([place])  A07.6     2. Lung nodule  R91.1     3. Lung nodule seen on imaging study  R91.1     4. Sputum production  R05.8     5. Dyspnea on exertion  R06.09       Discussion:  This is an 86 year old female, daily sputum production and uncomplicated bronchiectasis  Plan: Continue Stiolto New financial aid application signed today to help with inhaler cost Continue vest plus flutter valve. Discussed airway clearance techniques Continue exercise. Follow-up with [place]  as needed or in 1 year.    Current Outpatient Medications:    acetaminophen (TYLENOL) 500 MG tablet, Take 500 mg by mouth as needed., Disp: , Rfl:    albuterol (PROVENTIL) (2.5 MG/3ML) 0.083% nebulizer solution, Take 3 mLs (2.5 mg total) by nebulization every 6 (six) hours as needed for wheezing or shortness of breath., Disp: 75 mL, Rfl: 12   amLODipine (NORVASC) 5 MG tablet, Take one tablet (5 mg) by mouth daily at dinner time, Disp: 90 tablet, Rfl: 3   brimonidine (ALPHAGAN) 0.2 % ophthalmic solution, Place 1 drop into both eyes 2 (two) times daily. Place 1 drop into both eyes 2 (two) times daily., Disp: , Rfl:    Budeson-Glycopyrrol-Formoterol (BREZTRI AEROSPHERE) 160-9-4.8 MCG/ACT AERO, Inhale 2 puffs into the lungs in the morning and at bedtime., Disp: 5.9 g, Rfl: 0   carvedilol (COREG) 12.5 MG tablet, Take HALF tablet (6.25 mg) in MORNING, take WHOLE tablet (12.5 mg) in EVENING., Disp: 180 tablet, Rfl: 2   cetirizine (ZYRTEC) 10 MG tablet, Take 10 mg by mouth daily. , Disp: , Rfl:    cyclobenzaprine (FLEXERIL) 10 MG tablet, Take 10 mg by mouth 3 (three) times daily as needed for muscle spasms., Disp: , Rfl:    dorzolamide (TRUSOPT) 2 % ophthalmic solution, Place 1 drop into both eyes 2 (two) times daily. Place 1 drop into both eyes 2 (two) times daily., Disp: , Rfl:    hydrALAZINE (APRESOLINE) 25 MG tablet, Take 2 tablets (50 mg total) by mouth 3 (three) times daily as needed (Take as needed for systolic blood pressure > 170.)., Disp: 60 tablet, Rfl: 3   levothyroxine (SYNTHROID, LEVOTHROID) 50 MCG tablet, Take 50 mcg by mouth daily before breakfast., Disp: , Rfl:    LORazepam (ATIVAN) 0.5 MG tablet, Take 0.5 mg by mouth 2 (two) times daily as needed (tremors)., Disp: , Rfl:    losartan (COZAAR) 100 MG tablet, TAKE ONE TABLET BY MOUTH ONCE DAILY., Disp: 90 tablet, Rfl: 2   Multiple Vitamins-Minerals (MULTIVITAMIN WITH MINERALS) tablet, Take 1 tablet by mouth daily., Disp: , Rfl:     pantoprazole (PROTONIX) 40 MG tablet, Take 1 tablet (40 mg total)  by mouth daily before breakfast., Disp: 90 tablet, Rfl: 3   Polyethyl Glycol-Propyl Glycol (SYSTANE OP), Apply to eye daily., Disp: , Rfl:    pravastatin (PRAVACHOL) 40 MG tablet, Take 1 tablet (40 mg total) by mouth at bedtime., Disp: , Rfl:    sertraline (ZOLOFT) 100 MG tablet, Take 1 tablet (100 mg total) by mouth at bedtime. 2 at bedtime, Disp: , Rfl:    sodium chloride (OCEAN) 0.65 % nasal spray, Place 2 sprays into both nostrils at bedtime. Use as needed, Disp: , Rfl:    Tiotropium Bromide-Olodaterol (STIOLTO RESPIMAT) 2.5-2.5 MCG/ACT AERS, Inhale 2 puffs into the lungs daily., Disp: 4 g, Rfl: 0   traMADol (ULTRAM) 50 MG tablet, Take 50 mg by mouth 2 (two) times daily as needed (pain). , Disp: , Rfl:    traZODone (DESYREL) 50 MG tablet, Take 50 mg by mouth at bedtime., Disp: , Rfl:    triamcinolone (NASACORT) 55 MCG/ACT AERO nasal inhaler, Place 1 spray into the nose daily., Disp: , Rfl:     Garner Nash, DO [facility] 10/23/2021 2:07 PM

## 2021-11-30 DIAGNOSIS — E785 Hyperlipidemia, unspecified: Secondary | ICD-10-CM | POA: Diagnosis not present

## 2021-11-30 DIAGNOSIS — E039 Hypothyroidism, unspecified: Secondary | ICD-10-CM | POA: Diagnosis not present

## 2021-11-30 DIAGNOSIS — R7303 Prediabetes: Secondary | ICD-10-CM | POA: Diagnosis not present

## 2021-12-01 DIAGNOSIS — E782 Mixed hyperlipidemia: Secondary | ICD-10-CM | POA: Diagnosis not present

## 2021-12-01 DIAGNOSIS — I10 Essential (primary) hypertension: Secondary | ICD-10-CM | POA: Diagnosis not present

## 2021-12-05 DIAGNOSIS — R809 Proteinuria, unspecified: Secondary | ICD-10-CM | POA: Diagnosis not present

## 2021-12-05 DIAGNOSIS — E039 Hypothyroidism, unspecified: Secondary | ICD-10-CM | POA: Diagnosis not present

## 2021-12-05 DIAGNOSIS — I10 Essential (primary) hypertension: Secondary | ICD-10-CM | POA: Diagnosis not present

## 2021-12-05 DIAGNOSIS — E785 Hyperlipidemia, unspecified: Secondary | ICD-10-CM | POA: Diagnosis not present

## 2021-12-05 DIAGNOSIS — R7303 Prediabetes: Secondary | ICD-10-CM | POA: Diagnosis not present

## 2021-12-05 DIAGNOSIS — J479 Bronchiectasis, uncomplicated: Secondary | ICD-10-CM | POA: Diagnosis not present

## 2021-12-05 DIAGNOSIS — R0981 Nasal congestion: Secondary | ICD-10-CM | POA: Diagnosis not present

## 2021-12-05 DIAGNOSIS — N189 Chronic kidney disease, unspecified: Secondary | ICD-10-CM | POA: Diagnosis not present

## 2021-12-11 ENCOUNTER — Other Ambulatory Visit (HOSPITAL_COMMUNITY): Payer: Self-pay | Admitting: Internal Medicine

## 2021-12-11 DIAGNOSIS — Z1231 Encounter for screening mammogram for malignant neoplasm of breast: Secondary | ICD-10-CM

## 2021-12-20 ENCOUNTER — Other Ambulatory Visit: Payer: Self-pay

## 2021-12-20 ENCOUNTER — Ambulatory Visit (HOSPITAL_COMMUNITY)
Admission: RE | Admit: 2021-12-20 | Discharge: 2021-12-20 | Disposition: A | Discharge status: Home / Self Care | Payer: PPO | Source: Ambulatory Visit | Attending: Internal Medicine | Admitting: Internal Medicine

## 2021-12-20 DIAGNOSIS — Z1231 Encounter for screening mammogram for malignant neoplasm of breast: Secondary | ICD-10-CM | POA: Insufficient documentation

## 2021-12-26 ENCOUNTER — Telehealth: Payer: Self-pay | Admitting: Pulmonary Disease

## 2021-12-27 NOTE — Telephone Encounter (Signed)
Called [employer] at [phone], was on hold for over 15 minutes and was not ever able to get anyone on the phone.  Called and spoke with pt letting her know this info and she verbalized understanding. Stated to her to try to call again and also stated to her if she were accepted that they should send her a letter in the mail. Pt verbalized understanding. Nothing further needed.

## 2022-01-02 ENCOUNTER — Telehealth: Payer: Self-pay | Admitting: Pulmonary Disease

## 2022-01-02 NOTE — Telephone Encounter (Signed)
Spoke to patient, who is requesting update on stiolto patient assistance.  She submitted application in November.  I have provided patient with BI contact number.  She will call for update.  Nothing further needed at this time.

## 2022-01-03 ENCOUNTER — Telehealth: Payer: Self-pay | Admitting: Pulmonary Disease

## 2022-01-04 ENCOUNTER — Telehealth: Payer: Self-pay | Admitting: Pulmonary Disease

## 2022-01-04 NOTE — Telephone Encounter (Signed)
Routing encounter to both [facility] and Dr. Valeta Harms fot follow up.

## 2022-01-04 NOTE — Telephone Encounter (Signed)
Copy of [employer] application placed in pharmacy box, will redirect as specialty pharmacy team does not handle PAP for non-specialty medications.  Pt states that she has previously completed application, however received copy has only sporadic information completed--most notably there are no patient signatures and no income information filled in.   Recommend that CMA contact [place] and confirm exactly what information is needed and then reach out to pt to obtain any missing info. Income documentation is not required as long as last 4 digits of SSN is documented on application. Household size and total annual income/additional asset info (second home, second car, etc) is still required.  Placing in Dr. Juline Patch box for f/u.

## 2022-01-05 MED ORDER — STIOLTO RESPIMAT 2.5-2.5 MCG/ACT IN AERS: 2.0000 | INHALATION_SPRAY | Freq: Every day | RESPIRATORY_TRACT | 3 refills | Status: DC

## 2022-01-05 MED ORDER — STIOLTO RESPIMAT 2.5-2.5 MCG/ACT IN AERS: 2.0000 | INHALATION_SPRAY | Freq: Every day | RESPIRATORY_TRACT | 0 refills | Status: DC

## 2022-01-05 NOTE — Telephone Encounter (Signed)
Patient daughter came to the office to ask for samples of Stiolto and about patient assistance paperwork. Advised daughter that it may just be beneficial to redo the paperwork since patient brought in in November and it is now February and I don't see it scanned into chart. Gave her the patient portion and told her to ask for [facility] when she drops it off and I have the portion for Dr. Valeta Harms. Samples given to daughter. Nothing further needed at this time.

## 2022-01-05 NOTE — Telephone Encounter (Signed)
Lm for patient's daughter, Mia(DPR)

## 2022-01-29 ENCOUNTER — Other Ambulatory Visit: Payer: Self-pay | Admitting: Cardiovascular Disease

## 2022-01-31 ENCOUNTER — Other Ambulatory Visit: Payer: Self-pay

## 2022-01-31 DIAGNOSIS — M1611 Unilateral primary osteoarthritis, right hip: Secondary | ICD-10-CM | POA: Diagnosis not present

## 2022-01-31 DIAGNOSIS — M720 Palmar fascial fibromatosis [Dupuytren]: Secondary | ICD-10-CM | POA: Diagnosis not present

## 2022-01-31 MED ORDER — CARVEDILOL 12.5 MG PO TABS: ORAL_TABLET | ORAL | 2 refills | Status: DC

## 2022-01-31 NOTE — Telephone Encounter (Signed)
carvedilol (COREG) 12.5 MG tablet 180 tablet 2 01/31/2022    ?Sig: Take HALF tablet (6.25 mg) in MORNING, take WHOLE tablet (12.5 mg) in EVENING.   ? ?Pharmacy ? ?[place], [place]  ? ?

## 2022-02-09 ENCOUNTER — Ambulatory Visit: Payer: PPO | Admitting: Podiatry

## 2022-02-09 ENCOUNTER — Ambulatory Visit (INDEPENDENT_AMBULATORY_CARE_PROVIDER_SITE_OTHER): Payer: PPO

## 2022-02-09 ENCOUNTER — Encounter: Payer: Self-pay | Admitting: Podiatry

## 2022-02-09 ENCOUNTER — Other Ambulatory Visit: Payer: Self-pay

## 2022-02-09 DIAGNOSIS — M722 Plantar fascial fibromatosis: Secondary | ICD-10-CM | POA: Diagnosis not present

## 2022-02-09 MED ORDER — BETAMETHASONE SOD PHOS & ACET 6 (3-3) MG/ML IJ SUSP
3.0000 mg | Freq: Once | INTRAMUSCULAR | Status: AC
Start: 2022-02-09 — End: 2022-02-09
  Administered 2022-02-09: 3 mg via INTRA_ARTICULAR

## 2022-02-09 NOTE — Progress Notes (Signed)
? ?HPI: 87 y.o. female presenting today as a new patient for evaluation of left heel pain this been going on for about 3-4 weeks now.  Patient does recall getting a pair of hey dude shoes which may have elicited the pain.  She experiences achy sensation to the lateral aspect of the heel.  She has been icing her foot and using Epsom salt soaks and arthritis cream.  She presents for further treatment and evaluation ? ?Past Medical History:  ?Diagnosis Date  ? Adenomatous polyp of colon 03/2007  ? Allergic rhinitis   ? Allergy   ? has a lot congestion  ? Anxiety   ? Blood transfusion 1974  ? Bronchiectasis ([place])   ? Cancer [facility])   ? left arm  ? Depression   ? Diabetes mellitus without complication ([place])   ? no meds  ? Diastolic dysfunction   ? a. 01/2014 Echo: EF 60-65%, no rwma, Gr1 DD, Ao sclerosis w/o stenosis. Mild TR.  ? Diverticulosis of colon (without mention of hemorrhage)   ? DOE (dyspnea on exertion)   ? Fatigue   ? Fecal incontinence   ? GERD (gastroesophageal reflux disease)   ? Glaucoma   ? Goiter   ? right side  ? Heart murmur   ? History of stress test   ? a. 01/2014 Lexi MV: no ischemia/infarct.  ? Hyperlipidemia   ? Hypertension   ? a. 11/2018 Renal artery duplex: No RAS.   ? Hypothyroidism   ? IBS (irritable bowel syndrome)   ? Migraine   ? Mitral valve prolapse   ? a. 01/2014 Echo: no MR/MVP noted.  ? Neck mass   ? right side/ no airway problems/ per pt has had since 2005 (goiter)  ? Nonspecific abnormal electrocardiogram (ECG) (EKG)   ? Palpitations   ? Palpitations   ? a. 01/2014 CardioNet: wore for 9 days (rash). Sinus tach - 100's. No SVT/AF.  ? Polyp, sigmoid colon   ? Post-operative nausea and vomiting   ? Pulmonary nodules   ? Recurrent UTI   ? Tremor   ? ? ?Past Surgical History:  ?Procedure Laterality Date  ? ABDOMINAL HYSTERECTOMY    ? CARDIOVASCULAR STRESS TEST  09/19/2011  ? No scintigraphic evidence of inducible myocardial ischemia. Pharmacological stress test without chest pain or EKG  changes for ischemia.  ? COLONOSCOPY    ? ESOPHAGOGASTRODUODENOSCOPY    ? MASTOIDECTOMY    ? rt ear/ as a teenager  ? OOPHORECTOMY    ? SKIN CANCER EXCISION Left   ? arm  ? TOTAL HIP ARTHROPLASTY  5/10  ? left  ? TRANSTHORACIC ECHOCARDIOGRAM  09/19/2011  ? EF >56%, stage 1 diastolic dysfunction, mild tricuspid valve regurg  ? ? ?Allergies  ?Allergen Reactions  ? Prednisone   ?  REACTION: Increase eye pressure with gloucoma. Broke out in rash after injection per Dr. Percell Miller  ? Adhesive [Tape]   ?  Causes blisters/irritates skin  ? Penicillins   ?  REACTION: Rash  ? Sulfonamide Derivatives   ?  REACTION: Rash - not sure  ? ?  ?Physical Exam: ?General: The patient is alert and oriented x3 in no acute distress. ? ?Dermatology: Skin is warm, dry and supple bilateral lower extremities. Negative for open lesions or macerations. ? ?Vascular: Palpable pedal pulses bilaterally. Capillary refill within normal limits.  Negative for any significant edema or erythema ? ?Neurological: Light touch and protective threshold grossly intact ? ?Musculoskeletal Exam: No pedal deformities noted.  There is pain on palpation to the lateral aspect of the left heel ? ?Radiographic Exam:  ?Normal osseous mineralization. Joint spaces preserved. No fracture/dislocation/boney destruction.   ? ?Assessment: ?1.  Plantar fasciitis lateral aspect left heel ? ? ?Plan of Care:  ?1. Patient evaluated. X-Rays reviewed.  ?2.  Injection of 0.5 cc Celestone Soluspan injected in the lateral aspect of the left heel ?3.  Advise against wearing the hay dude shoes since she believes this may have elicited the pain ?4.  Return to clinic in 4 weeks for follow-up ? ?  ?  ?Edrick Kins, DPM ?[facility] ? ?Dr. Edrick Kins, DPM  ?  ?[address]                                        ?[place], [place] [address]                ?Office [phone]  ?Fax [phone] ? ? ? ? ? ?

## 2022-03-05 ENCOUNTER — Telehealth: Payer: Self-pay | Admitting: Pulmonary Disease

## 2022-03-05 MED ORDER — STIOLTO RESPIMAT 2.5-2.5 MCG/ACT IN AERS: 2.0000 | INHALATION_SPRAY | Freq: Every day | RESPIRATORY_TRACT | 5 refills | Status: DC

## 2022-03-05 NOTE — Telephone Encounter (Signed)
Called and spoke with pt who states she needs to have an Rx for [employer] sent to pharmacy. States that she usually got the medication through pt assistance but states that this year she did not qualify. Rx sent to preferred pharmacy. Nothing further needed. ?

## 2022-03-13 DIAGNOSIS — R040 Epistaxis: Secondary | ICD-10-CM | POA: Diagnosis not present

## 2022-03-13 DIAGNOSIS — R059 Cough, unspecified: Secondary | ICD-10-CM | POA: Diagnosis not present

## 2022-03-13 DIAGNOSIS — J479 Bronchiectasis, uncomplicated: Secondary | ICD-10-CM | POA: Diagnosis not present

## 2022-03-13 DIAGNOSIS — R0981 Nasal congestion: Secondary | ICD-10-CM | POA: Diagnosis not present

## 2022-03-13 DIAGNOSIS — J0100 Acute maxillary sinusitis, unspecified: Secondary | ICD-10-CM | POA: Diagnosis not present

## 2022-03-13 DIAGNOSIS — E039 Hypothyroidism, unspecified: Secondary | ICD-10-CM | POA: Diagnosis not present

## 2022-03-13 DIAGNOSIS — R509 Fever, unspecified: Secondary | ICD-10-CM | POA: Diagnosis not present

## 2022-03-19 DIAGNOSIS — N189 Chronic kidney disease, unspecified: Secondary | ICD-10-CM | POA: Diagnosis not present

## 2022-03-19 DIAGNOSIS — J0100 Acute maxillary sinusitis, unspecified: Secondary | ICD-10-CM | POA: Diagnosis not present

## 2022-03-19 DIAGNOSIS — R040 Epistaxis: Secondary | ICD-10-CM | POA: Diagnosis not present

## 2022-03-19 DIAGNOSIS — J479 Bronchiectasis, uncomplicated: Secondary | ICD-10-CM | POA: Diagnosis not present

## 2022-03-19 DIAGNOSIS — R0981 Nasal congestion: Secondary | ICD-10-CM | POA: Diagnosis not present

## 2022-03-19 DIAGNOSIS — J471 Bronchiectasis with (acute) exacerbation: Secondary | ICD-10-CM | POA: Diagnosis not present

## 2022-03-19 DIAGNOSIS — R059 Cough, unspecified: Secondary | ICD-10-CM | POA: Diagnosis not present

## 2022-03-19 DIAGNOSIS — E039 Hypothyroidism, unspecified: Secondary | ICD-10-CM | POA: Diagnosis not present

## 2022-03-20 ENCOUNTER — Ambulatory Visit: Payer: PPO | Admitting: Podiatry

## 2022-03-22 ENCOUNTER — Telehealth: Payer: Self-pay | Admitting: Pulmonary Disease

## 2022-03-22 MED ORDER — ALBUTEROL SULFATE (2.5 MG/3ML) 0.083% IN NEBU: 2.5000 mg | INHALATION_SOLUTION | Freq: Four times a day (QID) | RESPIRATORY_TRACT | 12 refills | Status: DC | PRN

## 2022-03-22 NOTE — Telephone Encounter (Signed)
I called the patient and she verified that she needed a refill of the nebulizer solution for her machine and I placed a order for the patient and she was appreciative. Nothing further needed.  ?

## 2022-04-09 DIAGNOSIS — H6980 Other specified disorders of Eustachian tube, unspecified ear: Secondary | ICD-10-CM | POA: Diagnosis not present

## 2022-04-09 DIAGNOSIS — H7011 Chronic mastoiditis, right ear: Secondary | ICD-10-CM | POA: Diagnosis not present

## 2022-04-09 DIAGNOSIS — J019 Acute sinusitis, unspecified: Secondary | ICD-10-CM | POA: Diagnosis not present

## 2022-04-09 DIAGNOSIS — H903 Sensorineural hearing loss, bilateral: Secondary | ICD-10-CM | POA: Diagnosis not present

## 2022-04-16 DIAGNOSIS — M3501 Sicca syndrome with keratoconjunctivitis: Secondary | ICD-10-CM | POA: Diagnosis not present

## 2022-04-16 DIAGNOSIS — H26491 Other secondary cataract, right eye: Secondary | ICD-10-CM | POA: Diagnosis not present

## 2022-04-16 DIAGNOSIS — H401132 Primary open-angle glaucoma, bilateral, moderate stage: Secondary | ICD-10-CM | POA: Diagnosis not present

## 2022-04-16 DIAGNOSIS — H43813 Vitreous degeneration, bilateral: Secondary | ICD-10-CM | POA: Diagnosis not present

## 2022-04-18 ENCOUNTER — Other Ambulatory Visit: Payer: Self-pay | Admitting: Cardiovascular Disease

## 2022-04-25 ENCOUNTER — Ambulatory Visit: Payer: PPO | Admitting: Cardiovascular Disease

## 2022-04-25 ENCOUNTER — Encounter: Payer: Self-pay | Admitting: Cardiovascular Disease

## 2022-04-25 VITALS — BP 102/58 | HR 73 | Ht 68.0 in | Wt 151.2 lb

## 2022-04-25 DIAGNOSIS — E039 Hypothyroidism, unspecified: Secondary | ICD-10-CM

## 2022-04-25 DIAGNOSIS — J479 Bronchiectasis, uncomplicated: Secondary | ICD-10-CM

## 2022-04-25 DIAGNOSIS — I10 Essential (primary) hypertension: Secondary | ICD-10-CM

## 2022-04-25 MED ORDER — AMLODIPINE BESYLATE 5 MG PO TABS: ORAL_TABLET | ORAL | 3 refills | Status: DC

## 2022-04-25 MED ORDER — LOSARTAN POTASSIUM 100 MG PO TABS: 100.0000 mg | ORAL_TABLET | Freq: Every day | ORAL | 3 refills | Status: DC

## 2022-04-25 NOTE — Patient Instructions (Signed)
Medication Instructions:  No changes  *If you need a refill on your cardiac medications before your next appointment, please call your pharmacy*   Lab Work: None ordered   If you have labs (blood work) drawn today and your tests are completely normal, you will receive your results only by: [place] (if you have MyChart) OR A paper copy in the mail If you have any lab test that is abnormal or we need to change your treatment, we will call you to review the results.   Testing/Procedures: None ordered   Follow-Up: At [facility], you and your health needs are our priority.  As part of our continuing mission to provide you with exceptional heart care, we have created designated Provider Care Teams.  These Care Teams include your primary Cardiologist (physician) and Advanced Practice Providers (APPs -  Physician Assistants and Nurse Practitioners) who all work together to provide you with the care you need, when you need it.  We recommend signing up for the patient portal called "MyChart".  Sign up information is provided on this After Visit Summary.  MyChart is used to connect with patients for Virtual Visits (Telemedicine).  Patients are able to view lab/test results, encounter notes, upcoming appointments, etc.  Non-urgent messages can be sent to your provider as well.   To learn more about what you can do with MyChart, go to [email or web address].    Your next appointment:   12 month(s)  The format for your next appointment:   In Person  Provider:   You may see Ida Rogue, MD or one of the following Advanced Practice Providers on your designated Care Team:   Murray Hodgkins, NP Christell Faith, PA-C Cadence Kathlen Mody, [place]   Other Instructions N/A  Important Information About Sugar

## 2022-04-25 NOTE — Progress Notes (Signed)
Cardiology Office Note  Date:  04/25/2022   ID:  Christy Richardson, DOB 07/08/1935, MRN [id number]  PCP:  Christy Squibb, MD   Chief Complaint  Patient presents with   12 month follow up     Patient c/o shortness of breath. Patient c/o her blood pressure being elevated in the am, she takes an extra half tablet of carvedilol in the pm; x 2 weeks. Medications reviewed by the patient verbally.     HPI:  Christy Richardson is a 87 y.o. female with a history of  labile hypertension, no renal artery stenosis Anxiety mitral valve prolapse per the patient with no mitral valve regurgitation on echocardiogram 2015 , 2020 history of palpitations, difficulty tolerating event monitor bronchiectasis,  GERD, goirter,  mixed hyperlipidemia,  hypertension She presents today for routine follow-up of her labile blood pressure and tachycardia  Last office visit May 2022 In follow-up today reports blood pressure continues to run high first thing in the morning when she checks it before a.m. 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  Chronic SOB and cough , bronchitis  BP elevated on first check in the Am, Drops during the day Typically no orthostasis  Ativan  or trazodone for anxiety/sleep  Nocturia, breaks sleep Too whole coreg this Am, typically takes 1/2,  BP low this Am  Sedentary, hip issues, neck and back issues No regular exercise program  EKG personally reviewed by myself on todays visit Nsr rate 73 bpm   Echocardiogram 2020  no  significant MR, no prolapse noted no indication for antibiotics  Lab work reviewed Excellent CBC, total cholesterol, BMP  EKG personally reviewed by myself on todays visit NSR rate 77 bpm no significant ST or T wave changes  Past medical history reviewed Echo 07/2019   1. The left ventricle has normal systolic function with an ejection fraction of 60-65%. The cavity size was normal. There is mildly increased left ventricular wall thickness. Left ventricular diastolic Doppler parameters are consistent with impaired  relaxation.  2. The right ventricle has normal systolic function. The cavity was normal. There is no increase in right ventricular wall thickness. Right ventricular systolic pressure is normal with an estimated pressure of 25.3 mmHg.  echo Doppler study on 02/02/2014.  This showed an ejection fraction of 60-65%.  She had grade 1 diastolic dysfunction.  There was mild aortic sclerosis without stenosis.      lexiscan perfusion study on 02/04/2014 was normal without scar or ischemia.  Post stress ejection fraction was 73%.      CardioNet monitor to assess for palpitations.  Unfortunately, she developed significant skin irritation, and only wore this for 9 days.  This revealed episodes of sinus rhythm, but she did have episodes of sinus tachycardia with rates in the low 100s.  There was there were no episodes of SVT or atrial fibrillation.  PMH:   has a past medical history of Adenomatous polyp of colon (03/2007), Allergic rhinitis, Allergy, Anxiety, Blood transfusion (1974), Bronchiectasis ([place]), Cancer ([place]), Depression, Diabetes mellitus without complication ([place]), Diastolic dysfunction, Diverticulosis of colon (without mention of hemorrhage),  DOE (dyspnea on exertion), Fatigue, Fecal incontinence, GERD (gastroesophageal reflux disease), Glaucoma, Goiter, Heart murmur, History of stress test, Hyperlipidemia, Hypertension, Hypothyroidism, IBS (irritable bowel syndrome), Migraine, Mitral  valve prolapse, Neck mass, Nonspecific abnormal electrocardiogram (ECG) (EKG), Palpitations, Palpitations, Polyp, sigmoid colon, Post-operative nausea and vomiting, Pulmonary nodules, Recurrent UTI, and Tremor.  PSH:    Past Surgical History:  Procedure Laterality Date   ABDOMINAL HYSTERECTOMY     CARDIOVASCULAR STRESS TEST  09/19/2011   No scintigraphic evidence of inducible myocardial ischemia. Pharmacological stress test without chest pain or EKG changes for ischemia.   COLONOSCOPY     ESOPHAGOGASTRODUODENOSCOPY     MASTOIDECTOMY     rt ear/ as a teenager   OOPHORECTOMY     SKIN CANCER EXCISION Left    arm   TOTAL HIP ARTHROPLASTY  5/10   left   TRANSTHORACIC ECHOCARDIOGRAM  09/19/2011   EF >58%, stage 1 diastolic dysfunction, mild tricuspid valve regurg    Current Outpatient Medications  Medication Sig Dispense Refill   acetaminophen (TYLENOL) 500 MG tablet Take 500 mg by mouth as needed.     albuterol (PROVENTIL) (2.5 MG/3ML) 0.083% nebulizer solution Take 3 mLs (2.5 mg total) by nebulization every 6 (six) hours as needed for wheezing or shortness of breath. 75 mL 12   amLODipine (NORVASC) 5 MG tablet Take one tablet (5 mg) by mouth daily at dinner time 90 tablet 3   brimonidine (ALPHAGAN) 0.2 % ophthalmic solution Place 1 drop into both eyes 2 (two) times daily. Place 1 drop into both eyes 2 (two) times daily.     carvedilol (COREG) 12.5 MG tablet Take HALF tablet (6.25 mg) in MORNING, take WHOLE tablet (12.5 mg) in EVENING. 180 tablet 2   cetirizine (ZYRTEC) 10 MG tablet Take 10 mg by mouth daily.      cyclobenzaprine (FLEXERIL) 10 MG tablet Take 10 mg by mouth 3 (three) times daily as needed for muscle spasms.     dorzolamide (TRUSOPT) 2 % ophthalmic solution Place 1 drop into both eyes 2 (two) times daily. Place 1 drop into both eyes 2 (two) times daily.     hydrALAZINE (APRESOLINE) 25 MG tablet Take 2 tablets (50 mg total) by mouth 3 (three) times daily as needed (Take as  needed for systolic blood pressure > 170.). 60 tablet 3   levothyroxine (SYNTHROID) 75 MCG tablet Take 75 mcg by mouth daily.     LORazepam (ATIVAN) 0.5 MG tablet Take 0.5 mg by mouth 2 (two) times daily as needed (tremors).     losartan (COZAAR) 100 MG tablet TAKE ONE TABLET BY MOUTH ONCE DAILY. 90 tablet 2   Multiple Vitamins-Minerals (MULTIVITAMIN WITH MINERALS) tablet Take 1 tablet by mouth daily.     pantoprazole (PROTONIX) 40 MG tablet Take 1 tablet (40 mg total) by mouth daily before breakfast. 90 tablet 3   Polyethyl Glycol-Propyl Glycol (SYSTANE OP) Apply to eye daily.     pravastatin (PRAVACHOL) 40 MG tablet Take 1 tablet (40 mg total) by mouth at bedtime.     sertraline (ZOLOFT) 100 MG tablet Take 1 tablet by mouth at bedtime.     sodium chloride (OCEAN) 0.65 % nasal spray Place 2 sprays into both nostrils at bedtime. Use as needed     Tiotropium Bromide-Olodaterol (STIOLTO RESPIMAT) 2.5-2.5 MCG/ACT AERS Inhale 2 puffs into the lungs daily. 4 g 5   traMADol (ULTRAM) 50 MG tablet Take 50 mg by mouth 2 (two) times daily as needed (pain).  traMADol (ULTRAM) 50 MG tablet Take 1 tablet by mouth 2 (two) times daily.     traZODone (DESYREL) 50 MG tablet Take 50 mg by mouth at bedtime.     triamcinolone (NASACORT) 55 MCG/ACT AERO nasal inhaler Place 1 spray into the nose daily.     levothyroxine (SYNTHROID) 50 MCG tablet Take 1 tablet by mouth daily. (Patient not taking: Reported on 04/25/2022)     levothyroxine (SYNTHROID, LEVOTHROID) 50 MCG tablet Take 50 mcg by mouth daily before breakfast. (Patient not taking: Reported on 04/25/2022)     LORazepam (ATIVAN) 0.5 MG tablet Take by mouth. (Patient not taking: Reported on 04/25/2022)     pantoprazole (PROTONIX) 40 MG tablet Take 1 tablet by mouth daily. (Patient not taking: Reported on 04/25/2022)     sertraline (ZOLOFT) 100 MG tablet Take 1 tablet (100 mg total) by mouth at bedtime. 2 at bedtime (Patient not taking: Reported on 04/25/2022)      No current facility-administered medications for this visit.    Allergies:   Prednisone, Adhesive [tape], Penicillins, and Sulfonamide derivatives   Social History:  The patient  reports that she has never smoked. She has never used smokeless tobacco. She reports current alcohol use. She reports that she does not use drugs.   Family History:   family history includes Asthma in her daughter; Colon cancer in her paternal grandfather; Coronary artery disease in her mother; Diabetes in her father; Prostate cancer in her brother; Scoliosis in her daughter; Throat cancer in her mother.    Review of Systems: Review of Systems  Constitutional: Negative.   HENT: Negative.    Respiratory: Negative.    Cardiovascular: Negative.   Gastrointestinal: Negative.   Musculoskeletal: Negative.   Neurological: Negative.   Psychiatric/Behavioral: Negative.    All other systems reviewed and are negative.  PHYSICAL EXAM: VS:  BP (!) 102/58 (BP Location: Left Arm, Patient Position: Sitting, Cuff Size: Normal)   Pulse 73   Ht '5\' 8"'$  (1.727 m)   Wt 151 lb 4 oz (68.6 kg)   SpO2 97%   BMI 23.00 kg/m  , BMI Body mass index is 23 kg/m. Constitutional:  oriented to person, place, and time. No distress.  HENT:  Head: Grossly normal Eyes:  no discharge. No scleral icterus.  Neck: No JVD, no carotid bruits  Cardiovascular: Regular rate and rhythm, no murmurs appreciated Pulmonary/Chest: Clear to auscultation bilaterally, no wheezes or rails Abdominal: Soft.  no distension.  no tenderness.  Musculoskeletal: Normal range of motion Neurological:  normal muscle tone. Coordination normal. No atrophy Skin: Skin warm and dry Psychiatric: normal affect, pleasant  Recent Labs: No results found for requested labs within last 8760 hours.    Lipid Panel Lab Results  Component Value Date   CHOL 201 (H) 03/01/2009   HDL 47 03/01/2009   LDLCALC 116 (H) 03/01/2009   TRIG 189 (H) 03/01/2009      Wt  Readings from Last 3 Encounters:  04/25/22 151 lb 4 oz (68.6 kg)  10/23/21 152 lb 9.6 oz (69.2 kg)  08/16/21 149 lb 12.8 oz (67.9 kg)     ASSESSMENT AND PLAN:  Hyperlipidemia LDL goal <70 - Plan: EKG 12-Lead On pravastatin, managed by PMD  Essential hypertension - Plan: EKG 12-Lead Labile history, high in the morning, lower in the day after medications Takes many of her medications in the evening for spiking pressure in the morning Took extra carvedilol this morning, whole pill, numbers lower  Bronchiectasis Stable, chronic sputum  Followed by pulmonary, periodic fares, seen on Ct scan 9/22  Anxiety Lorazepam and trazodone provided by primary care Feel she has undiagnosed sleep apnea waking her frequently in the nighttime contributing to symptoms  OSA, Does not want workup , up 4-5 times a night Feels this might be contributing to high blood pressure in the morning   Total encounter time more than 30 minutes  Greater than 50% was spent in counseling and coordination of care with the patient   No orders of the defined types were placed in this encounter.    Signed, Esmond Plants, M.D., Ph.D. 04/25/2022  [place], [place]

## 2022-06-06 ENCOUNTER — Telehealth: Payer: Self-pay | Admitting: Pulmonary Disease

## 2022-06-06 DIAGNOSIS — M1611 Unilateral primary osteoarthritis, right hip: Secondary | ICD-10-CM | POA: Diagnosis not present

## 2022-06-07 ENCOUNTER — Telehealth: Payer: Self-pay | Admitting: Cardiovascular Disease

## 2022-06-07 ENCOUNTER — Telehealth: Payer: Self-pay | Admitting: *Deleted

## 2022-06-07 NOTE — Telephone Encounter (Signed)
Have we received any surgical clearance forms for this patient?

## 2022-06-07 NOTE — Telephone Encounter (Signed)
I s/w the pt and she is agreeable to plan of care for tele visit 7/14/@ 2:20. Med rec and consent are done.     Patient Consent for Virtual Visit        Christy Richardson has provided verbal consent on 06/07/2022 for a virtual visit (video or telephone).   CONSENT FOR VIRTUAL VISIT FOR:  Christy Richardson  By participating in this virtual visit I agree to the following:  I hereby voluntarily request, consent and authorize [facility] and its employed or contracted physicians, physician assistants, nurse practitioners or other licensed health care professionals (the Practitioner), to provide me with telemedicine health care services (the "Services") as deemed necessary by the treating Practitioner. I acknowledge and consent to receive the Services by the Practitioner via telemedicine. I understand that the telemedicine visit will involve communicating with the Practitioner through live audiovisual communication technology and the disclosure of certain medical information by electronic transmission. I acknowledge that I have been given the opportunity to request an in-person assessment or other available alternative prior to the telemedicine visit and am voluntarily participating in the telemedicine visit.  I understand that I have the right to withhold or withdraw my consent to the use of telemedicine in the course of my care at any time, without affecting my right to future care or treatment, and that the Practitioner or I may terminate the telemedicine visit at any time. I understand that I have the right to inspect all information obtained and/or recorded in the course of the telemedicine visit and may receive copies of available information for a reasonable fee.  I understand that some of the potential risks of receiving the Services via telemedicine include:  Delay or interruption in medical evaluation due to technological equipment failure or disruption; Information transmitted may not be  sufficient (e.g. poor resolution of images) to allow for appropriate medical decision making by the Practitioner; and/or  In rare instances, security protocols could fail, causing a breach of personal health information.  Furthermore, I acknowledge that it is my responsibility to provide information about my medical history, conditions and care that is complete and accurate to the best of my ability. I acknowledge that Practitioner's advice, recommendations, and/or decision may be based on factors not within their control, such as incomplete or inaccurate data provided by me or distortions of diagnostic images or specimens that may result from electronic transmissions. I understand that the practice of medicine is not an exact science and that Practitioner makes no warranties or guarantees regarding treatment outcomes. I acknowledge that a copy of this consent can be made available to me via my patient portal ([place]), or I can request a printed copy by calling the office of [facility].    I understand that my insurance will be billed for this visit.   I have read or had this consent read to me. I understand the contents of this consent, which adequately explains the benefits and risks of the Services being provided via telemedicine.  I have been provided ample opportunity to ask questions regarding this consent and the Services and have had my questions answered to my satisfaction. I give my informed consent for the services to be provided through the use of telemedicine in my medical care

## 2022-06-07 NOTE — Telephone Encounter (Signed)
   Name: Christy Richardson  DOB: 08-23-1935  MRN: [id number]  Primary Cardiologist: Ida Rogue, MD   Preoperative team, please contact this patient and set up a phone call appointment for further preoperative risk assessment. Please obtain consent and complete medication review. Thank you for your help.  I confirm that guidance regarding antiplatelet and oral anticoagulation therapy has been completed and, if necessary, noted below.   Lenna Sciara, NP 06/07/2022, 3:48 PM [facility] [address] [place], [place] [address]

## 2022-06-07 NOTE — Telephone Encounter (Signed)
Patient was returning call. Please advise ?

## 2022-06-07 NOTE — Telephone Encounter (Signed)
   Pre-operative Risk Assessment    Patient Name: Christy Richardson  DOB: [birth year] MRN: [id number]      Request for Surgical Clearance    Procedure:   R total hip arthroplasty   Date of Surgery:  Clearance 07/10/22                                 Surgeon:  Rudene Christians  Surgeon's Group or Practice Name:  [facility] Phone number:  [phone] Fax number:  [phone]   Type of Clearance Requested:   - Medical  - Pharmacy:  Hold please   advise   Type of Anesthesia:  Not Indicated   Additional requests/questions:    Jonathon Jordan   06/07/2022, 1:08 PM

## 2022-06-07 NOTE — Telephone Encounter (Signed)
Patient's daughter called and states they will fax a form- gave her the office fax number. Surgeon is Dr. Kyla Balzarine at [facility] in [place]. Surgery is scheduled for 07/10/2022.

## 2022-06-07 NOTE — Telephone Encounter (Signed)
I s/w the pt and she is agreeable to plan of care for tele visit 7/14/@ 2:20. Med rec and consent are done.

## 2022-06-07 NOTE — Telephone Encounter (Signed)
Left the pt a message to call back to schedule a tele visit appt

## 2022-06-07 NOTE — Telephone Encounter (Signed)
PCC's dont have any forms

## 2022-06-12 ENCOUNTER — Other Ambulatory Visit: Payer: Self-pay | Admitting: Orthopedic Surgery

## 2022-06-15 ENCOUNTER — Encounter: Payer: Self-pay | Admitting: Physician Assistant

## 2022-06-15 ENCOUNTER — Ambulatory Visit (INDEPENDENT_AMBULATORY_CARE_PROVIDER_SITE_OTHER): Payer: PPO | Admitting: Physician Assistant

## 2022-06-15 DIAGNOSIS — E039 Hypothyroidism, unspecified: Secondary | ICD-10-CM | POA: Diagnosis not present

## 2022-06-15 DIAGNOSIS — R809 Proteinuria, unspecified: Secondary | ICD-10-CM | POA: Diagnosis not present

## 2022-06-15 DIAGNOSIS — Z01810 Encounter for preprocedural cardiovascular examination: Secondary | ICD-10-CM

## 2022-06-15 DIAGNOSIS — E785 Hyperlipidemia, unspecified: Secondary | ICD-10-CM | POA: Diagnosis not present

## 2022-06-15 DIAGNOSIS — R7303 Prediabetes: Secondary | ICD-10-CM | POA: Diagnosis not present

## 2022-06-15 NOTE — Progress Notes (Signed)
Virtual Visit via Telephone Note   Because of Christy Richardson's co-morbid illnesses, she is at least at moderate risk for complications without adequate follow up.  This format is felt to be most appropriate for this patient at this time.  The patient did not have access to video technology/had technical difficulties with video requiring transitioning to audio format only (telephone).  All issues noted in this document were discussed and addressed.  No physical exam could be performed with this format.  Please refer to the patient's chart for her consent to telehealth for [facility].  Evaluation Performed:  Preoperative cardiovascular risk assessment _____________   Date:  06/15/2022   Patient ID:  Christy Richardson, DOB September 05, 1935, MRN [id number] Patient Location:  Home Provider location:   Office  Primary Care Provider:  Celene Squibb, MD Primary Cardiologist:  Ida Rogue, MD  Chief Complaint / Patient Profile   87 y.o. y/o female with a h/o labile hypertension (no renal artery stenosis), anxiety, mitral valve prolapse with no mitral valve regurgitation on echocardiogram 2015 in 2020, history of palpitations (difficulty tolerating an event monitor), bronchiectasis, GERD, hyperlipidemia who is pending right total hip arthroplasty and presents today for telephonic preoperative cardiovascular risk assessment.  Past Medical History    Past Medical History:  Diagnosis Date   Adenomatous polyp of colon 03/2007   Allergic rhinitis    Allergy    has a lot congestion   Anxiety    Blood transfusion 1974   Bronchiectasis ([employer])    Cancer ([place])    left arm   Depression    Diabetes mellitus without complication ([place])    no meds   Diastolic dysfunction    a. 01/2014 Echo: EF 60-65%, no rwma, Gr1 DD, Ao sclerosis w/o stenosis. Mild TR.   Diverticulosis of colon (without mention of hemorrhage)    DOE (dyspnea on exertion)    Fatigue    Fecal incontinence    GERD (gastroesophageal  reflux disease)    Glaucoma    Goiter    right side   Heart murmur    History of stress test    a. 01/2014 Lexi MV: no ischemia/infarct.   Hyperlipidemia    Hypertension    a. 11/2018 Renal artery duplex: No RAS.    Hypothyroidism    IBS (irritable bowel syndrome)    Migraine    Mitral valve prolapse    a. 01/2014 Echo: no MR/MVP noted.   Neck mass    right side/ no airway problems/ per pt has had since 2005 (goiter)   Nonspecific abnormal electrocardiogram (ECG) (EKG)    Palpitations    Palpitations    a. 01/2014 CardioNet: wore for 9 days (rash). Sinus tach - 100's. No SVT/AF.   Polyp, sigmoid colon    Post-operative nausea and vomiting    Pulmonary nodules    Recurrent UTI    Tremor    Past Surgical History:  Procedure Laterality Date   ABDOMINAL HYSTERECTOMY     CARDIOVASCULAR STRESS TEST  09/19/2011   No scintigraphic evidence of inducible myocardial ischemia. Pharmacological stress test without chest pain or EKG changes for ischemia.   COLONOSCOPY     ESOPHAGOGASTRODUODENOSCOPY     MASTOIDECTOMY     rt ear/ as a teenager   OOPHORECTOMY     SKIN CANCER EXCISION Left    arm   TOTAL HIP ARTHROPLASTY  5/10   left   TRANSTHORACIC ECHOCARDIOGRAM  09/19/2011   EF >55%, stage 1  diastolic dysfunction, mild tricuspid valve regurg    Allergies  Allergies  Allergen Reactions   Prednisone     REACTION: Increase eye pressure with gloucoma. Broke out in rash after injection per Dr. Cherlynn Perches [Tape]     Causes blisters/irritates skin   Penicillins     REACTION: Rash   Sulfonamide Derivatives     REACTION: Rash - not sure    History of Present Illness    Christy Richardson is a 87 y.o. female who presents via audio/video conferencing for a telehealth visit today.  Pt was last seen in cardiology clinic on 04/25/2022 by Dr. Rockey Situ.  At that time Christy Richardson was doing well .  The patient is now pending procedure as outlined above.   Since her last visit, she states  that everything is the same.  She tells me her blood pressure is little high in the morning but when she takes her medications it comes down.  She has chronic shortness of breath due to her lung disease which is about the same.  She takes her inhaler and Mucinex.  She does not do much walking due to her hip.  She is able to get up and down the stairs using railing.  She does not have stairs in her house.  She does her own dusting and vacuuming.  She gets help with her mowing.  She does not do any recreational activities.  Because of this she has a 4.73 METS on the DASI.  This exceeds the minimum METS requirement of 4.  Reports no shortness of breath nor dyspnea on exertion. Reports no chest pain, pressure, or tightness. No edema, orthopnea, PND. Reports no palpitations.   Home Medications    Prior to Admission medications   Medication Sig Start Date End Date Taking? Authorizing Provider  acetaminophen (TYLENOL) 500 MG tablet Take 500 mg by mouth as needed.    [provider]  albuterol (PROVENTIL) (2.5 MG/3ML) 0.083% nebulizer solution Take 3 mLs (2.5 mg total) by nebulization every 6 (six) hours as needed for wheezing or shortness of breath. 03/22/22   Icard, Octavio Graves, DO  amLODipine (NORVASC) 5 MG tablet Take one tablet (5 mg) by mouth daily at dinner time 04/25/22   Minna Merritts, MD  brimonidine (ALPHAGAN) 0.2 % ophthalmic solution Place 1 drop into both eyes 2 (two) times daily. Place 1 drop into both eyes 2 (two) times daily. 12/21/15   [provider]  carvedilol (COREG) 12.5 MG tablet Take HALF tablet (6.25 mg) in MORNING, take WHOLE tablet (12.5 mg) in EVENING. 01/31/22   Gollan, Kathlene November, MD  cetirizine (ZYRTEC) 10 MG tablet Take 10 mg by mouth daily.  03/30/11   Parrett, Fonnie Mu, NP  cyclobenzaprine (FLEXERIL) 10 MG tablet Take 10 mg by mouth 3 (three) times daily as needed for muscle spasms.    [provider]  dorzolamide (TRUSOPT) 2 % ophthalmic solution Place  1 drop into both eyes 2 (two) times daily. Place 1 drop into both eyes 2 (two) times daily. 11/03/15   [provider]  hydrALAZINE (APRESOLINE) 25 MG tablet Take 2 tablets (50 mg total) by mouth 3 (three) times daily as needed (Take as needed for systolic blood pressure > 170.). 06/24/19   Theora Gianotti, NP  levothyroxine (SYNTHROID) 50 MCG tablet Take 1 tablet by mouth daily. Patient not taking: Reported on 04/25/2022    [provider]  levothyroxine (SYNTHROID) 75 MCG tablet Take  75 mcg by mouth daily. 03/19/22   [provider]  levothyroxine (SYNTHROID, LEVOTHROID) 50 MCG tablet Take 50 mcg by mouth daily before breakfast. Patient not taking: Reported on 04/25/2022    [provider]  LORazepam (ATIVAN) 0.5 MG tablet Take 0.5 mg by mouth 2 (two) times daily as needed (tremors).    [provider]  LORazepam (ATIVAN) 0.5 MG tablet Take by mouth. Patient not taking: Reported on 04/25/2022    [provider]  losartan (COZAAR) 100 MG tablet TAKE ONE TABLET BY MOUTH ONCE DAILY. Patient not taking: Reported on 06/07/2022 10/10/21   Minna Merritts, MD  losartan (COZAAR) 100 MG tablet Take 1 tablet (100 mg total) by mouth daily. 04/25/22   Minna Merritts, MD  Multiple Vitamins-Minerals (MULTIVITAMIN WITH MINERALS) tablet Take 1 tablet by mouth daily.    [provider]  pantoprazole (PROTONIX) 40 MG tablet Take 1 tablet (40 mg total) by mouth daily before breakfast. 06/20/18   Gatha Mayer, MD  pantoprazole (PROTONIX) 40 MG tablet Take 1 tablet by mouth daily. Patient not taking: Reported on 04/25/2022 11/15/21   [provider]  Polyethyl Glycol-Propyl Glycol (SYSTANE OP) Apply to eye daily.    [provider]  pravastatin (PRAVACHOL) 40 MG tablet Take 1 tablet (40 mg total) by mouth at bedtime. 03/30/11   Parrett, Fonnie Mu, NP  sertraline (ZOLOFT) 100 MG tablet Take 1 tablet (100 mg total) by mouth at bedtime.  2 at bedtime Patient not taking: Reported on 04/25/2022 03/30/11   Parrett, Fonnie Mu, NP  sertraline (ZOLOFT) 100 MG tablet Take 1 tablet by mouth at bedtime. 12/19/21   [provider]  sodium chloride (OCEAN) 0.65 % nasal spray Place 2 sprays into both nostrils at bedtime. Use as needed 03/30/11   Parrett, Fonnie Mu, NP  Tiotropium Bromide-Olodaterol (STIOLTO RESPIMAT) 2.5-2.5 MCG/ACT AERS Inhale 2 puffs into the lungs daily. 03/05/22   Icard, Octavio Graves, DO  traMADol (ULTRAM) 50 MG tablet Take 50 mg by mouth 2 (two) times daily as needed (pain).     [provider]  traMADol (ULTRAM) 50 MG tablet Take 1 tablet by mouth 2 (two) times daily. Patient not taking: Reported on 06/07/2022    [provider]  traZODone (DESYREL) 50 MG tablet Take 50 mg by mouth at bedtime.    [provider]  triamcinolone (NASACORT) 55 MCG/ACT AERO nasal inhaler Place 1 spray into the nose daily.    [provider]    Physical Exam    Vital Signs:  Christy Richardson does not have vital signs available for review today.  Given telephonic nature of communication, physical exam is limited. AAOx3. NAD. Normal affect.  Speech and respirations are unlabored.  Accessory Clinical Findings    None  Assessment & Plan    1.  Preoperative Cardiovascular Risk Assessment:  Christy Richardson perioperative risk of a major cardiac event is 0.4% according to the Revised Cardiac Risk Index (RCRI).  Therefore, she is at low risk for perioperative complications.   Her functional capacity is fair at 4.73 METs according to the [employer] Activity Status Index (DASI). Recommendations: According to ACC/AHA guidelines, no further cardiovascular testing needed.  The patient may proceed to surgery at acceptable risk.   Patient does not seem to be on any antiplatelet or anticoagulation agents.    A copy of this note will be routed to requesting surgeon.  Time:   Today, I have spent 15 minutes with  the patient  with telehealth technology discussing medical history, symptoms, and management plan.     Elgie Collard, PA-C  06/15/2022, 12:46 PM

## 2022-06-15 NOTE — Telephone Encounter (Signed)
Fax received from Dr. Rudene Christians with [facility] to perform a Right total Hip arthroplasty on patient.  Patient needs surgery clearance. Surgery is 07/10/2022. Patient was seen on 10/23/2021. Office protocol is a risk assessment can be sent to surgeon if patient has been seen in 60 days or less.   Sending to Roxan Diesel NP for risk assessment or recommendations if patient needs to be seen in office prior to surgical procedure.    Office visit scheduled with Roxan Diesel NP on 06/21/2022

## 2022-06-21 ENCOUNTER — Ambulatory Visit (INDEPENDENT_AMBULATORY_CARE_PROVIDER_SITE_OTHER): Payer: PPO

## 2022-06-21 ENCOUNTER — Ambulatory Visit (INDEPENDENT_AMBULATORY_CARE_PROVIDER_SITE_OTHER): Payer: PPO | Admitting: Nurse Practitioner

## 2022-06-21 ENCOUNTER — Encounter: Payer: Self-pay | Admitting: Nurse Practitioner

## 2022-06-21 VITALS — BP 116/70 | HR 75 | Temp 97.6°F | Ht 68.0 in | Wt 149.8 lb

## 2022-06-21 DIAGNOSIS — J479 Bronchiectasis, uncomplicated: Secondary | ICD-10-CM | POA: Diagnosis not present

## 2022-06-21 DIAGNOSIS — Z01811 Encounter for preprocedural respiratory examination: Secondary | ICD-10-CM | POA: Diagnosis not present

## 2022-06-21 DIAGNOSIS — Z01818 Encounter for other preprocedural examination: Secondary | ICD-10-CM | POA: Diagnosis not present

## 2022-06-21 NOTE — Patient Instructions (Addendum)
Continue Stiolto 2 puffs daily. Continue Albuterol inhaler 2 puffs or 3 mL neb every 6 hours as needed for shortness of breath or wheezing. Notify if symptoms persist despite rescue inhaler/neb use. Continue Nasacort 1 spray each nostril daily Continue Ocean Spray 2 sprays each nostril at bedtime as needed for nasal congestion/drainage. Continue Protonix 40 mg daily Continue Zyrtec 1 tab daily Continue Mucinex twice daily for chest congestion Continue flutter valve 2-3 times a day Continue vest therapy twice daily  Take your inhalers with you to surgery. Out of bed after surgery as soon as possible. Incentive spirometer every hour while awake. Flutter valve 3 times a day. I hope you have a speedy recovery after your surgery!   Chest x ray today  Follow up in 6 months with Dr. Valeta Harms. If symptoms do not improve or worsen, please contact office for sooner follow up or seek emergency care.

## 2022-06-21 NOTE — Progress Notes (Signed)
$'@Patient'w$  ID: Christy Richardson, female    DOB: May 01, 1935, 87 y.o.   MRN: [id number]  Chief Complaint  Patient presents with   Follow-up    Needs surgical clearance for hip replacement for 07/10/22.    Referring provider: Celene Squibb, MD  HPI: 87 year old female, never smoker followed for bronchiectasis and lung nodules.  She is a patient Dr. Juline Patch and last seen in office 10/23/2021.  Past medical history significant for migraines, hypertension, CHF, GERD, IBS, hypothyroidism, essential tremor, OA, HLD, anxiety, depression, glaucoma.  TEST/EVENTS:  08/31/2021 CT chest without contrast: Atherosclerosis.  There is no adenopathy.  Bronchiectatic changes within the middle lobe and lower lobe on the right and lingular and lower lobe on the left.  4 mm left upper lobe pulmonary nodule which is stable compared to previous.  There are scattered areas of nodularity in the left lung base without change.  Biapical scarring similar to previous.  Right upper lobe pulmonary nodule unchanged and measuring 5 mm.  Pulmonary nodule in the right lower lobe measuring 6 mm and also unchanged.  These are both stable over 2-year interval.  10/23/2021: OV with Dr. Valeta Harms for follow-up.  Doing well with flutter valve.  Rarely requiring vest therapy.  She is using Stiolto.  She was put on Breztri for short period but like Stiolto better so she went back.  She is currently receiving financial assistance for this.  She is able to complete most activities of daily living; does notice that she is getting a little bit slower than she was before.  Overall doing well.  Recommended continuing airway clearance techniques and exercise.  Follow-up in 1 year or as needed.  06/21/2022: Today - surgical clearance Patient presents today with her daughter for follow up and surgical clearance prior to hip replacement surgery scheduled for 8/8. She reports that she has been stable since the last time we saw her. She still has a daily,  productive cough, which is unchanged from her baseline. She gets winded walking to the mailbox but is able to do things around the house, which she reports has been her normal for quite some time. She did have a sinus infection back in April which she was treated with abx for, but she has not required any prednisone or abx for breathing or flare in her cough. She denies any fevers, night sweats, hemoptysis, leg swelling, orthopnea, PND. She uses Stiolto daily, feels like it helps. Rare use of albuterol. She takes mucinex 1200 mg once a day. She uses her flutter and vest occasionally. She is nervous for her upcoming surgery given her age, lung condition, and heart.   Allergies  Allergen Reactions   Prednisone     REACTION: Increase eye pressure with gloucoma. Broke out in rash after injection per Dr. Cherlynn Perches [Tape]     Causes blisters/irritates skin   Penicillins     REACTION: Rash   Sulfonamide Derivatives     REACTION: Rash - not sure    Immunization History  Administered Date(s) Administered   Fluad Quad(high Dose 65+) 09/19/2020   Influenza Split 09/05/2013, 08/18/2014, 08/31/2015   Influenza Whole 09/23/2006, 10/03/2007, 08/25/2008, 08/03/2010, 09/02/2012   Influenza, High Dose Seasonal PF 09/20/2017, 09/19/2018, 09/01/2019   Influenza-Unspecified 08/03/2014, 09/02/2018   Moderna Sars-Covid-2 Vaccination 01/28/2020, 02/26/2020   Pneumococcal Conjugate-13 08/18/2014   Pneumococcal Polysaccharide-23 05/04/2003    Past Medical History:  Diagnosis Date   Adenomatous polyp of colon 03/2007   Allergic rhinitis  Allergy    has a lot congestion   Anxiety    Blood transfusion 1974   Bronchiectasis ([place])    Cancer ([place])    left arm   Depression    Diabetes mellitus without complication ([place])    no meds   Diastolic dysfunction    a. 01/2014 Echo: EF 60-65%, no rwma, Gr1 DD, Ao sclerosis w/o stenosis. Mild TR.   Diverticulosis of colon (without mention of hemorrhage)     DOE (dyspnea on exertion)    Fatigue    Fecal incontinence    GERD (gastroesophageal reflux disease)    Glaucoma    Goiter    right side   Heart murmur    History of stress test    a. 01/2014 Lexi MV: no ischemia/infarct.   Hyperlipidemia    Hypertension    a. 11/2018 Renal artery duplex: No RAS.    Hypothyroidism    IBS (irritable bowel syndrome)    Migraine    Mitral valve prolapse    a. 01/2014 Echo: no MR/MVP noted.   Neck mass    right side/ no airway problems/ per pt has had since 2005 (goiter)   Nonspecific abnormal electrocardiogram (ECG) (EKG)    Palpitations    Palpitations    a. 01/2014 CardioNet: wore for 9 days (rash). Sinus tach - 100's. No SVT/AF.   Polyp, sigmoid colon    Post-operative nausea and vomiting    Pulmonary nodules    Recurrent UTI    Tremor     Tobacco History: Social History   Tobacco Use  Smoking Status Never   Passive exposure: Past  Smokeless Tobacco Never   Counseling given: Not Answered   Outpatient Medications Prior to Visit  Medication Sig Dispense Refill   acetaminophen (TYLENOL) 500 MG tablet Take 500 mg by mouth as needed.     albuterol (PROVENTIL) (2.5 MG/3ML) 0.083% nebulizer solution Take 3 mLs (2.5 mg total) by nebulization every 6 (six) hours as needed for wheezing or shortness of breath. 75 mL 12   amLODipine (NORVASC) 5 MG tablet Take one tablet (5 mg) by mouth daily at dinner time 90 tablet 3   brimonidine (ALPHAGAN) 0.2 % ophthalmic solution Place 1 drop into both eyes 2 (two) times daily. Place 1 drop into both eyes 2 (two) times daily.     carvedilol (COREG) 12.5 MG tablet Take HALF tablet (6.25 mg) in MORNING, take WHOLE tablet (12.5 mg) in EVENING. 180 tablet 2   cetirizine (ZYRTEC) 10 MG tablet Take 10 mg by mouth daily.      cyclobenzaprine (FLEXERIL) 10 MG tablet Take 10 mg by mouth 3 (three) times daily as needed for muscle spasms.     dorzolamide (TRUSOPT) 2 % ophthalmic solution Place 1 drop into both eyes 2  (two) times daily. Place 1 drop into both eyes 2 (two) times daily.     hydrALAZINE (APRESOLINE) 25 MG tablet Take 2 tablets (50 mg total) by mouth 3 (three) times daily as needed (Take as needed for systolic blood pressure > 170.). 60 tablet 3   levothyroxine (SYNTHROID) 50 MCG tablet Take 1 tablet by mouth daily.     levothyroxine (SYNTHROID) 75 MCG tablet Take 75 mcg by mouth daily.     levothyroxine (SYNTHROID, LEVOTHROID) 50 MCG tablet Take 50 mcg by mouth daily before breakfast.     LORazepam (ATIVAN) 0.5 MG tablet Take 0.5 mg by mouth 2 (two) times daily as needed (tremors).     LORazepam (  ATIVAN) 0.5 MG tablet Take by mouth.     losartan (COZAAR) 100 MG tablet TAKE ONE TABLET BY MOUTH ONCE DAILY. 90 tablet 2   losartan (COZAAR) 100 MG tablet Take 1 tablet (100 mg total) by mouth daily. 90 tablet 3   Multiple Vitamins-Minerals (MULTIVITAMIN WITH MINERALS) tablet Take 1 tablet by mouth daily.     pantoprazole (PROTONIX) 40 MG tablet Take 1 tablet (40 mg total) by mouth daily before breakfast. 90 tablet 3   pantoprazole (PROTONIX) 40 MG tablet Take 1 tablet by mouth daily.     Polyethyl Glycol-Propyl Glycol (SYSTANE OP) Apply to eye daily.     pravastatin (PRAVACHOL) 40 MG tablet Take 1 tablet (40 mg total) by mouth at bedtime.     sertraline (ZOLOFT) 100 MG tablet Take 100 mg by mouth at bedtime. 2 at bedtime     sertraline (ZOLOFT) 100 MG tablet Take 1 tablet by mouth at bedtime.     sodium chloride (OCEAN) 0.65 % nasal spray Place 2 sprays into both nostrils at bedtime. Use as needed     Tiotropium Bromide-Olodaterol (STIOLTO RESPIMAT) 2.5-2.5 MCG/ACT AERS Inhale 2 puffs into the lungs daily. 4 g 5   traMADol (ULTRAM) 50 MG tablet Take 50 mg by mouth 2 (two) times daily as needed (pain).      traMADol (ULTRAM) 50 MG tablet Take 1 tablet by mouth 2 (two) times daily.     traZODone (DESYREL) 50 MG tablet Take 50 mg by mouth at bedtime.     triamcinolone (NASACORT) 55 MCG/ACT AERO nasal  inhaler Place 1 spray into the nose daily.     No facility-administered medications prior to visit.     Review of Systems:   Constitutional: No weight loss or gain, night sweats, fevers, chills, fatigue, or lassitude. HEENT: No headaches, difficulty swallowing, tooth/dental problems, or sore throat. No sneezing, itching, ear ache, nasal congestion, or post nasal drip CV:  No chest pain, orthopnea, PND, swelling in lower extremities, anasarca, dizziness, palpitations, syncope Resp: +shortness of breath with exertion (baseline); chronic cough (baseline). No excess mucus or change in color of mucus. No hemoptysis. No wheezing.  No chest wall deformity GI:  No heartburn, indigestion, abdominal pain, nausea, vomiting, diarrhea, change in bowel habits, loss of appetite, bloody stools.  Skin: No rash, lesions, ulcerations MSK:  +chronic hip pain. No joint pain or swelling.  No decreased range of motion.  No back pain. Neuro: No dizziness or lightheadedness.  Psych: No depression or anxiety. Mood stable.     Physical Exam:  BP 116/70 (BP Location: Right Arm, Patient Position: Sitting, Cuff Size: Normal)   Pulse 75   Temp 97.6 F (36.4 C) (Oral)   Ht '5\' 8"'$  (1.727 m)   Wt 149 lb 12.8 oz (67.9 kg)   SpO2 94%   BMI 22.78 kg/m   GEN: Pleasant, interactive, well-appearing; elderly; in no acute distress. HEENT:  Normocephalic and atraumatic. PERRLA. Sclera white. Nasal turbinates pink, moist and patent bilaterally. No rhinorrhea present. Oropharynx pink and moist, without exudate or edema. No lesions, ulcerations, or postnasal drip.  NECK:  Supple w/ fair ROM. No JVD present. Normal carotid impulses w/o bruits. Thyroid symmetrical with no goiter or nodules palpated. No lymphadenopathy.   CV: RRR, no m/r/g, no peripheral edema. Pulses intact, +2 bilaterally. No cyanosis, pallor or clubbing. PULMONARY:  Unlabored, regular breathing. Clear bilaterally A&P w/o wheezes/rales/rhonchi. No accessory  muscle use. No dullness to percussion. GI: BS present and normoactive. Soft, non-tender  to palpation.  MSK: No erythema, warmth or tenderness. Cap refil <2 sec all extrem.  Neuro: A/Ox3. No focal deficits noted.   Skin: Warm, no lesions or rashe Psych: Normal affect and behavior. Judgement and thought content appropriate.     Lab Results:  CBC    Component Value Date/Time   WBC 5.3 11/06/2018 0754   RBC 4.87 11/06/2018 0754   HGB 14.5 11/06/2018 0754   HGB CANCELED 11/04/2018 1114   HCT 43.8 11/06/2018 0754   HCT CANCELED 11/04/2018 1114   PLT 226 11/06/2018 0754   PLT CANCELED 11/04/2018 1114   MCV 89.9 11/06/2018 0754   MCH 29.8 11/06/2018 0754   MCHC 33.1 11/06/2018 0754   RDW 12.7 11/06/2018 0754   LYMPHSABS 1.1 11/06/2018 0754   LYMPHSABS CANCELED 11/04/2018 1114   MONOABS 0.3 11/06/2018 0754   EOSABS 0.2 11/06/2018 0754   EOSABS CANCELED 11/04/2018 1114   BASOSABS 0.0 11/06/2018 0754   BASOSABS CANCELED 11/04/2018 1114    BMET    Component Value Date/Time   NA 139 11/06/2018 0754   NA 140 11/04/2018 1114   K 3.9 11/06/2018 0754   CL 104 11/06/2018 0754   CO2 28 11/06/2018 0754   GLUCOSE 103 (H) 11/06/2018 0754   BUN 12 11/06/2018 0754   BUN 13 11/04/2018 1114   CREATININE 0.77 11/06/2018 0754   CREATININE 1.01 04/12/2014 1419   CALCIUM 9.3 11/06/2018 0754   GFRNONAA >60 11/06/2018 0754   GFRAA >60 11/06/2018 0754    BNP No results found for: "BNP"   Imaging:  No results found.       Latest Ref Rng & Units 02/25/2018   10:55 AM  PFT Results  FVC-Pre L 3.15   FVC-Predicted Pre % 106   Pre FEV1/FVC % % 61   FEV1-Pre L 1.92   FEV1-Predicted Pre % 86   DLCO uncorrected ml/min/mmHg 14.54   DLCO UNC% % 51   DLVA Predicted % 58   TLC L 5.53   TLC % Predicted % 100   RV % Predicted % 86     No results found for: "NITRICOXIDE"      Assessment & Plan:   Bronchiectasis without acute exacerbation (HCC) Overall respiratory status appears  stable.  She continues to have a daily productive cough, which is unchanged from her baseline.  Recommended that she use Mucinex twice a day and increase compliance with vest therapy and flutter valve.  Continue Stiolto for obstruction and as needed albuterol.  CXR today for preoperative clearance.  Patient Instructions  Continue Stiolto 2 puffs daily. Continue Albuterol inhaler 2 puffs or 3 mL neb every 6 hours as needed for shortness of breath or wheezing. Notify if symptoms persist despite rescue inhaler/neb use. Continue Nasacort 1 spray each nostril daily Continue Ocean Spray 2 sprays each nostril at bedtime as needed for nasal congestion/drainage. Continue Protonix 40 mg daily Continue Zyrtec 1 tab daily Continue Mucinex twice daily for chest congestion Continue flutter valve 2-3 times a day Continue vest therapy twice daily  Take your inhalers with you to surgery. Out of bed after surgery as soon as possible. Incentive spirometer every hour while awake. Flutter valve 3 times a day. I hope you have a speedy recovery after your surgery!   Chest x ray today  Follow up in 6 months with Dr. Valeta Harms. If symptoms do not improve or worsen, please contact office for sooner follow up or seek emergency care.     Preop respiratory  exam Moderate to high risk from pulmonary standpoint.  Factors that increase the risk for postoperative pulmonary complications are bronchiectasis with obstructive lung disease, age, CHF.   Respiratory complications generally occur in 1% of ASA Class I patients, 5% of ASA Class II and 10% of ASA Class III-IV patients These complications rarely result in mortality and include postoperative pneumonia, atelectasis, pulmonary embolism, ARDS and increased time requiring postoperative mechanical ventilation.   Overall, I recommend proceeding with the surgery if the risk for respiratory complications are outweighed by the potential benefits. This will need to be discussed  between the patient and surgeon.   To reduce risks of respiratory complications, I recommend: --Pre- and post-operative incentive spirometry performed frequently while awake --Inpatient use of currently prescribed bronchodilators  --Short duration of surgery as much as possible and avoid paralytic if possible. --OOB, encourage mobility post-op, DVT prophylaxis    1) RISK FOR PROLONGED MECHANICAL VENTILAION - > 48h  1A) Arozullah - Prolonged mech ventilation risk Arozullah Postperative Pulmonary Risk Score - for mech ventilation dependence >48h [employer], Ann Surg 2000, major non-cardiac surgery) Comment Score  Type of surgery - abd ao aneurysm (27), thoracic (21), neurosurgery / upper abdominal / vascular (21), neck (11) Total hip 0  Emergency Surgery - (11)  0  ALbumin < 3 or poor nutritional state - (9)  0  BUN > 30 -  (8)  0  Partial or completely dependent functional status - (7)  0  COPD -  (6) Obstruction (BTX) 6  Age - 60 to 25 (4), > 70  (6) 84 6  TOTAL  12  Risk Stratifcation scores  - < 10 (0.5%), 11-19 (1.8%), 20-27 (4.2%), 28-40 (10.1%), >40 (26.6%)  1.8%     I spent 28 minutes of dedicated to the care of this patient on the date of this encounter to include pre-visit review of records, face-to-face time with the patient discussing conditions above, post visit ordering of testing, clinical documentation with the electronic health record, making appropriate referrals as documented, and communicating necessary findings to members of the patients care team.  Clayton Bibles, NP 06/21/2022  Pt aware and understands NP's role.

## 2022-06-21 NOTE — Assessment & Plan Note (Addendum)
Moderate to high risk from pulmonary standpoint.  Factors that increase the risk for postoperative pulmonary complications are bronchiectasis with obstructive lung disease, age, CHF.   Respiratory complications generally occur in 1% of ASA Class I patients, 5% of ASA Class II and 10% of ASA Class III-IV patients These complications rarely result in mortality and include postoperative pneumonia, atelectasis, pulmonary embolism, ARDS and increased time requiring postoperative mechanical ventilation.   Overall, I recommend proceeding with the surgery if the risk for respiratory complications are outweighed by the potential benefits. This will need to be discussed between the patient and surgeon.   To reduce risks of respiratory complications, I recommend: --Pre- and post-operative incentive spirometry performed frequently while awake --Inpatient use of currently prescribed bronchodilators  --Short duration of surgery as much as possible and avoid paralytic if possible. --OOB, encourage mobility post-op, DVT prophylaxis    1) RISK FOR PROLONGED MECHANICAL VENTILAION - > 48h  1A) Arozullah - Prolonged mech ventilation risk Arozullah Postperative Pulmonary Risk Score - for mech ventilation dependence >48h [employer], Ann Surg 2000, major non-cardiac surgery) Comment Score  Type of surgery - abd ao aneurysm (27), thoracic (21), neurosurgery / upper abdominal / vascular (21), neck (11) Total hip 0  Emergency Surgery - (11)  0  ALbumin < 3 or poor nutritional state - (9)  0  BUN > 30 -  (8)  0  Partial or completely dependent functional status - (7)  0  COPD -  (6) Obstruction (BTX) 6  Age - 60 to 69 (4), > 70  (6) 84 6  TOTAL  12  Risk Stratifcation scores  - < 10 (0.5%), 11-19 (1.8%), 20-27 (4.2%), 28-40 (10.1%), >40 (26.6%)  1.8%

## 2022-06-21 NOTE — Assessment & Plan Note (Signed)
Overall respiratory status appears stable.  She continues to have a daily productive cough, which is unchanged from her baseline.  Recommended that she use Mucinex twice a day and increase compliance with vest therapy and flutter valve.  Continue Stiolto for obstruction and as needed albuterol.  CXR today for preoperative clearance.  Patient Instructions  Continue Stiolto 2 puffs daily. Continue Albuterol inhaler 2 puffs or 3 mL neb every 6 hours as needed for shortness of breath or wheezing. Notify if symptoms persist despite rescue inhaler/neb use. Continue Nasacort 1 spray each nostril daily Continue Ocean Spray 2 sprays each nostril at bedtime as needed for nasal congestion/drainage. Continue Protonix 40 mg daily Continue Zyrtec 1 tab daily Continue Mucinex twice daily for chest congestion Continue flutter valve 2-3 times a day Continue vest therapy twice daily  Take your inhalers with you to surgery. Out of bed after surgery as soon as possible. Incentive spirometer every hour while awake. Flutter valve 3 times a day. I hope you have a speedy recovery after your surgery!   Chest x ray today  Follow up in 6 months with Dr. Valeta Harms. If symptoms do not improve or worsen, please contact office for sooner follow up or seek emergency care.

## 2022-06-22 DIAGNOSIS — R809 Proteinuria, unspecified: Secondary | ICD-10-CM | POA: Diagnosis not present

## 2022-06-22 DIAGNOSIS — M169 Osteoarthritis of hip, unspecified: Secondary | ICD-10-CM | POA: Diagnosis not present

## 2022-06-22 DIAGNOSIS — E039 Hypothyroidism, unspecified: Secondary | ICD-10-CM | POA: Diagnosis not present

## 2022-06-22 DIAGNOSIS — I10 Essential (primary) hypertension: Secondary | ICD-10-CM | POA: Diagnosis not present

## 2022-06-22 DIAGNOSIS — R7303 Prediabetes: Secondary | ICD-10-CM | POA: Diagnosis not present

## 2022-06-22 DIAGNOSIS — N3281 Overactive bladder: Secondary | ICD-10-CM | POA: Diagnosis not present

## 2022-06-22 DIAGNOSIS — E785 Hyperlipidemia, unspecified: Secondary | ICD-10-CM | POA: Diagnosis not present

## 2022-06-22 DIAGNOSIS — F411 Generalized anxiety disorder: Secondary | ICD-10-CM | POA: Diagnosis not present

## 2022-06-22 DIAGNOSIS — J479 Bronchiectasis, uncomplicated: Secondary | ICD-10-CM | POA: Diagnosis not present

## 2022-06-22 DIAGNOSIS — N189 Chronic kidney disease, unspecified: Secondary | ICD-10-CM | POA: Diagnosis not present

## 2022-06-22 NOTE — Telephone Encounter (Signed)
OV notes and clearance form have been faxed back to [employer]. Nothing further needed at this time.

## 2022-06-26 DIAGNOSIS — M1611 Unilateral primary osteoarthritis, right hip: Secondary | ICD-10-CM | POA: Diagnosis not present

## 2022-06-29 ENCOUNTER — Encounter
Admission: RE | Admit: 2022-06-29 | Discharge: 2022-06-29 | Disposition: A | Discharge status: Home / Self Care | Payer: PPO | Source: Ambulatory Visit | Attending: Orthopedic Surgery | Admitting: Orthopedic Surgery

## 2022-06-29 ENCOUNTER — Encounter: Payer: Self-pay | Admitting: Orthopedic Surgery

## 2022-06-29 ENCOUNTER — Telehealth: Payer: Self-pay | Admitting: Nurse Practitioner

## 2022-06-29 VITALS — BP 103/65 | HR 68 | Resp 16 | Ht 68.0 in | Wt 148.8 lb

## 2022-06-29 DIAGNOSIS — Z01812 Encounter for preprocedural laboratory examination: Secondary | ICD-10-CM | POA: Insufficient documentation

## 2022-06-29 DIAGNOSIS — Z01811 Encounter for preprocedural respiratory examination: Secondary | ICD-10-CM

## 2022-06-29 DIAGNOSIS — Z01818 Encounter for other preprocedural examination: Secondary | ICD-10-CM

## 2022-06-29 DIAGNOSIS — E875 Hyperkalemia: Secondary | ICD-10-CM | POA: Insufficient documentation

## 2022-06-29 DIAGNOSIS — E119 Type 2 diabetes mellitus without complications: Secondary | ICD-10-CM | POA: Diagnosis not present

## 2022-06-29 HISTORY — DX: Pulmonary fibrosis, unspecified: J84.10

## 2022-06-29 HISTORY — DX: Hyperkalemia: E87.5

## 2022-06-29 HISTORY — DX: Pneumonia, unspecified organism: J18.9

## 2022-06-29 HISTORY — DX: Unspecified osteoarthritis, unspecified site: M19.90

## 2022-06-29 HISTORY — DX: Dyspnea, unspecified: R06.00

## 2022-06-29 HISTORY — DX: Tachycardia, unspecified: R00.0

## 2022-06-29 HISTORY — DX: Unspecified hearing loss, unspecified ear: H91.90

## 2022-06-29 LAB — URINALYSIS, ROUTINE W REFLEX MICROSCOPIC
Bacteria, UA: NONE SEEN
Bilirubin Urine: NEGATIVE
Glucose, UA: NEGATIVE mg/dL
Hgb urine dipstick: NEGATIVE
Ketones, ur: NEGATIVE mg/dL
Nitrite: NEGATIVE
Protein, ur: NEGATIVE mg/dL
Specific Gravity, Urine: 1.014 (ref 1.005–1.030)
pH: 6 (ref 5.0–8.0)

## 2022-06-29 LAB — SURGICAL PCR SCREEN
MRSA, PCR: NEGATIVE
Staphylococcus aureus: NEGATIVE

## 2022-06-29 LAB — TYPE AND SCREEN
ABO/RH(D): O POS
Antibody Screen: NEGATIVE

## 2022-06-29 LAB — POTASSIUM: Potassium: 4.8 mmol/L (ref 3.5–5.1)

## 2022-06-29 MED ORDER — ALBUTEROL SULFATE HFA 108 (90 BASE) MCG/ACT IN AERS: 2.0000 | INHALATION_SPRAY | Freq: Four times a day (QID) | RESPIRATORY_TRACT | 6 refills | Status: DC | PRN

## 2022-06-29 NOTE — Telephone Encounter (Signed)
Called patient back and advised her that I was sending in a rescue inhaler for her to have for her hip surgery just in case it was needed. Nothing further needed

## 2022-06-29 NOTE — Telephone Encounter (Signed)
Yes ok for albuterol HFA 2 puff every 4 hours as needed

## 2022-06-29 NOTE — Patient Instructions (Addendum)
Your procedure is scheduled on:07-10-22 Tuesday Report to the Registration Desk on the 1st floor of the [facility].Then proceed to the 2nd floor Surgery Desk To find out your arrival time, please call [phone] between 1PM - 3PM on:07-09-22 Monday If your arrival time is 6:00 am, do not arrive prior to that time as the [facility] entrance doors do not open until 6:00 am.  REMEMBER: Instructions that are not followed completely may result in serious medical risk, up to and including death; or upon the discretion of your surgeon and anesthesiologist your surgery may need to be rescheduled.  Do not eat food after midnight the night before surgery.  No gum chewing, lozengers or hard candies.  You may however, drink Water to 2 hours before you are scheduled to arrive for your surgery. Do not drink anything within 2 hours of your scheduled arrival time.  In addition, your doctor has ordered for you to drink the provided Gatorade G2 Drinking this carbohydrate drink up to two hours before surgery helps to reduce insulin resistance and improve patient outcomes. Please complete drinking 2 hours prior to scheduled arrival time.  TAKE THESE MEDICATIONS THE MORNING OF SURGERY WITH A SIP OF WATER: -carvedilol (COREG) -cetirizine (ZYRTEC)  -guaifenesin (MUCUS RELIEF) -levothyroxine (SYNTHROID) -sertraline (ZOLOFT)  -pantoprazole (PROTONIX)-take one the night before and one on the morning of surgery - helps to prevent nausea after surgery.) -You may take your LORazepam (ATIVAN) if needed for anxiety  Use your Albuterol Nebulizer at home the day of surgery and also your STIOLTO Inhaler the day of surgery  One week prior to surgery:Last dose on 07-02-22 Stop Anti-inflammatories (NSAIDS) such as Advil, Aleve, Ibuprofen, Motrin, Naproxen, Naprosyn and Aspirin based products such as Excedrin, Goodys Powder, BC Powder.You may however, take Tylenol/Tramadol if needed for pain up until the day of  surgery.  Stop ANY OVER THE COUNTER supplements/vitamins 7 days prior to surgery (Multivitamin, Vitamins D3 and Probiotic)  No Alcohol for 24 hours before or after surgery.  No Smoking including e-cigarettes for 24 hours prior to surgery.  No chewable tobacco products for at least 6 hours prior to surgery.  No nicotine patches on the day of surgery.  Do not use any "recreational" drugs for at least a week prior to your surgery.  Please be advised that the combination of cocaine and anesthesia may have negative outcomes, up to and including death. If you test positive for cocaine, your surgery will be cancelled.  On the morning of surgery brush your teeth with toothpaste and water, you may rinse your mouth with mouthwash if you wish. Do not swallow any toothpaste or mouthwash.  Use CHG Soap directed on instruction sheet.  Do not wear jewelry, make-up, hairpins, clips or nail polish.  Do not wear lotions, powders, or perfumes.   Do not shave body from the neck down 48 hours prior to surgery just in case you cut yourself which could leave a site for infection.  Also, freshly shaved skin may become irritated if using the CHG soap.  Contact lenses, hearing aids and dentures may not be worn into surgery.  Do not bring valuables to the hospital. [facility] is not responsible for any missing/lost belongings or valuables.   Notify your doctor if there is any change in your medical condition (cold, fever, infection).  Wear comfortable clothing (specific to your surgery type) to the hospital.  After surgery, you can help prevent lung complications by doing breathing exercises.  Take deep  breaths and cough every 1-2 hours. Your doctor may order a device called an Incentive Spirometer to help you take deep breaths. When coughing or sneezing, hold a pillow firmly against your incision with both hands. This is called "splinting." Doing this helps protect your incision. It also decreases belly  discomfort.  If you are being admitted to the hospital overnight, leave your suitcase in the car. After surgery it may be brought to your room.  If you are being discharged the day of surgery, you will not be allowed to drive home. You will need a responsible adult (18 years or older) to drive you home and stay with you that night.   If you are taking public transportation, you will need to have a responsible adult (18 years or older) with you. Please confirm with your physician that it is acceptable to use public transportation.   Please call the [facility] at [phone] if you have any questions about these instructions.  Surgery Visitation Policy:  Patients undergoing a surgery or procedure may have two family members or support persons with them as long as the person is not COVID-19 positive or experiencing its symptoms.   Inpatient Visitation:    Visiting hours are 7 a.m. to 8 p.m. Up to four visitors are allowed at one time in a patient room, including children. The visitors may rotate out with other people during the day. One designated support person (adult) may remain overnight.    How to Use an Incentive Spirometer An incentive spirometer is a tool that measures how well you are filling your lungs with each breath. Learning to take long, deep breaths using this tool can help you keep your lungs clear and active. This may help to reverse or lessen your chance of developing breathing (pulmonary) problems, especially infection. You may be asked to use a spirometer: After a surgery. If you have a lung problem or a history of smoking. After a long period of time when you have been unable to move or be active. If the spirometer includes an indicator to show the highest number that you have reached, your health care provider or respiratory therapist will help you set a goal. Keep a log of your progress as told by your health care provider. What are the  risks? Breathing too quickly may cause dizziness or cause you to pass out. Take your time so you do not get dizzy or light-headed. If you are in pain, you may need to take pain medicine before doing incentive spirometry. It is harder to take a deep breath if you are having pain. How to use your incentive spirometer  Sit up on the edge of your bed or on a chair. Hold the incentive spirometer so that it is in an upright position. Before you use the spirometer, breathe out normally. Place the mouthpiece in your mouth. Make sure your lips are closed tightly around it. Breathe in slowly and as deeply as you can through your mouth, causing the piston or the ball to rise toward the top of the chamber. Hold your breath for 3-5 seconds, or for as long as possible. If the spirometer includes a coach indicator, use this to guide you in breathing. Slow down your breathing if the indicator goes above the marked areas. Remove the mouthpiece from your mouth and breathe out normally. The piston or ball will return to the bottom of the chamber. Rest for a few seconds, then repeat the steps 10 or  more times. Take your time and take a few normal breaths between deep breaths so that you do not get dizzy or light-headed. Do this every 1-2 hours when you are awake. If the spirometer includes a goal marker to show the highest number you have reached (best effort), use this as a goal to work toward during each repetition. After each set of 10 deep breaths, cough a few times. This will help to make sure that your lungs are clear. If you have an incision on your chest or abdomen from surgery, place a pillow or a rolled-up towel firmly against the incision when you cough. This can help to reduce pain while taking deep breaths and coughing. General tips When you are able to get out of bed: Walk around often. Continue to take deep breaths and cough in order to clear your lungs. Keep using the incentive spirometer until  your health care provider says it is okay to stop using it. If you have been in the hospital, you may be told to keep using the spirometer at home. Contact a health care provider if: You are having difficulty using the spirometer. You have trouble using the spirometer as often as instructed. Your pain medicine is not giving enough relief for you to use the spirometer as told. You have a fever. Get help right away if: You develop shortness of breath. You develop a cough with bloody mucus from the lungs. You have fluid or blood coming from an incision site after you cough. Summary An incentive spirometer is a tool that can help you learn to take long, deep breaths to keep your lungs clear and active. You may be asked to use a spirometer after a surgery, if you have a lung problem or a history of smoking, or if you have been inactive for a long period of time. Use your incentive spirometer as instructed every 1-2 hours while you are awake. If you have an incision on your chest or abdomen, place a pillow or a rolled-up towel firmly against your incision when you cough. This will help to reduce pain. Get help right away if you have shortness of breath, you cough up bloody mucus, or blood comes from your incision when you cough. This information is not intended to replace advice given to you by your health care provider. Make sure you discuss any questions you have with your health care provider. Document Revised: 02/08/2020 Document Reviewed: 02/08/2020 Elsevier Patient Education  [place].

## 2022-06-29 NOTE — Telephone Encounter (Signed)
Called patient and she is wanting a rescue inahler called into her pharmacy. I see on her chart she has Albuterol nebs.   Sir are you ok if I order Albuterol HFA for patient?  Please advise since Joellen Jersey is out of office.   This is not a sickie sir!!!

## 2022-07-10 ENCOUNTER — Ambulatory Visit: Payer: PPO | Admitting: Urgent Care

## 2022-07-10 ENCOUNTER — Other Ambulatory Visit: Payer: Self-pay

## 2022-07-10 ENCOUNTER — Inpatient Hospital Stay
Admission: RE | Admit: 2022-07-10 | Discharge: 2022-07-13 | DRG: 470 | Disposition: A | Discharge status: Skilled Nursing Facility | Payer: PPO | Attending: Orthopedic Surgery | Admitting: Orthopedic Surgery

## 2022-07-10 ENCOUNTER — Ambulatory Visit: Payer: PPO

## 2022-07-10 ENCOUNTER — Encounter
Admission: RE | Disposition: A | Discharge status: Skilled Nursing Facility | Payer: Self-pay | Source: Home / Self Care | Attending: Orthopedic Surgery

## 2022-07-10 ENCOUNTER — Encounter: Payer: Self-pay | Admitting: Orthopedic Surgery

## 2022-07-10 DIAGNOSIS — E119 Type 2 diabetes mellitus without complications: Secondary | ICD-10-CM | POA: Diagnosis present

## 2022-07-10 DIAGNOSIS — Z87440 Personal history of urinary (tract) infections: Secondary | ICD-10-CM

## 2022-07-10 DIAGNOSIS — I341 Nonrheumatic mitral (valve) prolapse: Secondary | ICD-10-CM | POA: Diagnosis present

## 2022-07-10 DIAGNOSIS — E785 Hyperlipidemia, unspecified: Secondary | ICD-10-CM | POA: Diagnosis present

## 2022-07-10 DIAGNOSIS — Z79890 Hormone replacement therapy (postmenopausal): Secondary | ICD-10-CM

## 2022-07-10 DIAGNOSIS — Z01818 Encounter for other preprocedural examination: Secondary | ICD-10-CM

## 2022-07-10 DIAGNOSIS — F419 Anxiety disorder, unspecified: Secondary | ICD-10-CM | POA: Diagnosis present

## 2022-07-10 DIAGNOSIS — Z833 Family history of diabetes mellitus: Secondary | ICD-10-CM | POA: Diagnosis not present

## 2022-07-10 DIAGNOSIS — Z888 Allergy status to other drugs, medicaments and biological substances status: Secondary | ICD-10-CM

## 2022-07-10 DIAGNOSIS — J479 Bronchiectasis, uncomplicated: Secondary | ICD-10-CM | POA: Diagnosis present

## 2022-07-10 DIAGNOSIS — M169 Osteoarthritis of hip, unspecified: Secondary | ICD-10-CM | POA: Diagnosis not present

## 2022-07-10 DIAGNOSIS — R2681 Unsteadiness on feet: Secondary | ICD-10-CM | POA: Diagnosis not present

## 2022-07-10 DIAGNOSIS — Z86010 Personal history of colonic polyps: Secondary | ICD-10-CM | POA: Diagnosis not present

## 2022-07-10 DIAGNOSIS — H9191 Unspecified hearing loss, right ear: Secondary | ICD-10-CM | POA: Diagnosis present

## 2022-07-10 DIAGNOSIS — Z90710 Acquired absence of both cervix and uterus: Secondary | ICD-10-CM | POA: Diagnosis not present

## 2022-07-10 DIAGNOSIS — Z880 Allergy status to penicillin: Secondary | ICD-10-CM | POA: Diagnosis not present

## 2022-07-10 DIAGNOSIS — Z801 Family history of malignant neoplasm of trachea, bronchus and lung: Secondary | ICD-10-CM | POA: Diagnosis not present

## 2022-07-10 DIAGNOSIS — K219 Gastro-esophageal reflux disease without esophagitis: Secondary | ICD-10-CM | POA: Diagnosis present

## 2022-07-10 DIAGNOSIS — I10 Essential (primary) hypertension: Secondary | ICD-10-CM | POA: Diagnosis present

## 2022-07-10 DIAGNOSIS — M6281 Muscle weakness (generalized): Secondary | ICD-10-CM | POA: Diagnosis not present

## 2022-07-10 DIAGNOSIS — Z96641 Presence of right artificial hip joint: Principal | ICD-10-CM

## 2022-07-10 DIAGNOSIS — M1611 Unilateral primary osteoarthritis, right hip: Secondary | ICD-10-CM | POA: Diagnosis present

## 2022-07-10 DIAGNOSIS — E039 Hypothyroidism, unspecified: Secondary | ICD-10-CM | POA: Diagnosis present

## 2022-07-10 DIAGNOSIS — Z8249 Family history of ischemic heart disease and other diseases of the circulatory system: Secondary | ICD-10-CM

## 2022-07-10 DIAGNOSIS — M6259 Muscle wasting and atrophy, not elsewhere classified, multiple sites: Secondary | ICD-10-CM | POA: Diagnosis not present

## 2022-07-10 DIAGNOSIS — Z7401 Bed confinement status: Secondary | ICD-10-CM | POA: Diagnosis not present

## 2022-07-10 DIAGNOSIS — Z79899 Other long term (current) drug therapy: Secondary | ICD-10-CM

## 2022-07-10 DIAGNOSIS — Z471 Aftercare following joint replacement surgery: Secondary | ICD-10-CM | POA: Diagnosis not present

## 2022-07-10 DIAGNOSIS — R262 Difficulty in walking, not elsewhere classified: Secondary | ICD-10-CM | POA: Diagnosis not present

## 2022-07-10 DIAGNOSIS — Z974 Presence of external hearing-aid: Secondary | ICD-10-CM

## 2022-07-10 DIAGNOSIS — I959 Hypotension, unspecified: Secondary | ICD-10-CM | POA: Diagnosis not present

## 2022-07-10 HISTORY — PX: TOTAL HIP ARTHROPLASTY: SHX124

## 2022-07-10 HISTORY — DX: Type 2 diabetes mellitus without complications: E11.9

## 2022-07-10 LAB — CBC
HCT: 40.1 % (ref 36.0–46.0)
Hemoglobin: 13.1 g/dL (ref 12.0–15.0)
MCH: 28.9 pg (ref 26.0–34.0)
MCHC: 32.7 g/dL (ref 30.0–36.0)
MCV: 88.5 fL (ref 80.0–100.0)
Platelets: 176 10*3/uL (ref 150–400)
RBC: 4.53 MIL/uL (ref 3.87–5.11)
RDW: 12.8 % (ref 11.5–15.5)
WBC: 6 10*3/uL (ref 4.0–10.5)
nRBC: 0 % (ref 0.0–0.2)

## 2022-07-10 LAB — GLUCOSE, CAPILLARY
Glucose-Capillary: 121 mg/dL — ABNORMAL HIGH (ref 70–99)
Glucose-Capillary: 107 mg/dL — ABNORMAL HIGH (ref 70–99)

## 2022-07-10 LAB — CREATININE, SERUM
Creatinine, Ser: 0.93 mg/dL (ref 0.44–1.00)
GFR, Estimated: >60 mL/min (ref >60)

## 2022-07-10 SURGERY — ARTHROPLASTY, HIP, TOTAL, ANTERIOR APPROACH
Anesthesia: Spinal | Site: Hip | Laterality: Right

## 2022-07-10 MED ORDER — SALINE SPRAY 0.65 % NA SOLN: 2.0000 | Freq: Every day | NASAL | Status: DC | PRN

## 2022-07-10 MED ORDER — HYDRALAZINE HCL 50 MG PO TABS: 50.0000 mg | ORAL_TABLET | Freq: Three times a day (TID) | ORAL | Status: DC | PRN

## 2022-07-10 MED ORDER — EPHEDRINE 5 MG/ML INJ
INTRAVENOUS | Status: AC
  Filled 2022-07-10: qty 5

## 2022-07-10 MED ORDER — ONDANSETRON HCL 4 MG/2ML IJ SOLN: 4.0000 mg | Freq: Four times a day (QID) | INTRAMUSCULAR | Status: DC | PRN

## 2022-07-10 MED ORDER — ACETAMINOPHEN 325 MG PO TABS
325.0000 mg | ORAL_TABLET | Freq: Four times a day (QID) | ORAL | Status: DC | PRN
  Administered 2022-07-11 (×2): 650 mg via ORAL
  Filled 2022-07-10 (×2): qty 2

## 2022-07-10 MED ORDER — SEVOFLURANE IN SOLN
RESPIRATORY_TRACT | Status: AC
  Filled 2022-07-10: qty 250

## 2022-07-10 MED ORDER — BRIMONIDINE TARTRATE 0.2 % OP SOLN
1.0000 [drp] | Freq: Two times a day (BID) | OPHTHALMIC | Status: DC
  Administered 2022-07-10 – 2022-07-13 (×6): 1 [drp] via OPHTHALMIC
  Filled 2022-07-10: qty 5

## 2022-07-10 MED ORDER — ALBUTEROL SULFATE (2.5 MG/3ML) 0.083% IN NEBU
2.5000 mg | INHALATION_SOLUTION | Freq: Four times a day (QID) | RESPIRATORY_TRACT | Status: DC | PRN
  Administered 2022-07-13: 2.5 mg via RESPIRATORY_TRACT
  Filled 2022-07-10: qty 3

## 2022-07-10 MED ORDER — MENTHOL 3 MG MT LOZG: 1.0000 | LOZENGE | OROMUCOSAL | Status: DC | PRN

## 2022-07-10 MED ORDER — SERTRALINE HCL 50 MG PO TABS
200.0000 mg | ORAL_TABLET | Freq: Every day | ORAL | Status: DC
  Administered 2022-07-11 – 2022-07-13 (×3): 200 mg via ORAL
  Filled 2022-07-10 (×3): qty 4

## 2022-07-10 MED ORDER — METOCLOPRAMIDE HCL 5 MG PO TABS: 5.0000 mg | ORAL_TABLET | Freq: Three times a day (TID) | ORAL | Status: DC | PRN

## 2022-07-10 MED ORDER — BISACODYL 5 MG PO TBEC
5.0000 mg | DELAYED_RELEASE_TABLET | Freq: Every day | ORAL | Status: DC | PRN
  Administered 2022-07-11: 5 mg via ORAL
  Filled 2022-07-10: qty 1

## 2022-07-10 MED ORDER — TRAZODONE HCL 50 MG PO TABS
50.0000 mg | ORAL_TABLET | Freq: Every day | ORAL | Status: DC
Start: 2022-07-10 — End: 2022-07-14
  Administered 2022-07-10 – 2022-07-11 (×2): 50 mg via ORAL
  Filled 2022-07-10 (×3): qty 1

## 2022-07-10 MED ORDER — PHENYLEPHRINE HCL-NACL 20-0.9 MG/250ML-% IV SOLN
INTRAVENOUS | Status: DC | PRN
  Administered 2022-07-10: 30 ug/min via INTRAVENOUS

## 2022-07-10 MED ORDER — HYDROCODONE-ACETAMINOPHEN 7.5-325 MG PO TABS
1.0000 | ORAL_TABLET | ORAL | Status: DC | PRN
  Administered 2022-07-13: 2 via ORAL
  Filled 2022-07-10: qty 2

## 2022-07-10 MED ORDER — GUAIFENESIN ER 600 MG PO TB12
600.0000 mg | ORAL_TABLET | Freq: Two times a day (BID) | ORAL | Status: DC
  Administered 2022-07-11 – 2022-07-13 (×5): 600 mg via ORAL
  Filled 2022-07-10 (×5): qty 1

## 2022-07-10 MED ORDER — DORZOLAMIDE HCL 2 % OP SOLN
1.0000 [drp] | Freq: Two times a day (BID) | OPHTHALMIC | Status: DC
Start: 2022-07-10 — End: 2022-07-14
  Administered 2022-07-10 – 2022-07-13 (×6): 1 [drp] via OPHTHALMIC
  Filled 2022-07-10: qty 10

## 2022-07-10 MED ORDER — CHLORHEXIDINE GLUCONATE 0.12 % MT SOLN: 15.0000 mL | Freq: Once | OROMUCOSAL | Status: AC

## 2022-07-10 MED ORDER — BUPIVACAINE LIPOSOME 1.3 % IJ SUSP
INTRAMUSCULAR | Status: AC
  Filled 2022-07-10: qty 20

## 2022-07-10 MED ORDER — PRAVASTATIN SODIUM 20 MG PO TABS
40.0000 mg | ORAL_TABLET | Freq: Every day | ORAL | Status: DC
  Administered 2022-07-10 – 2022-07-12 (×3): 40 mg via ORAL
  Filled 2022-07-10 (×3): qty 2

## 2022-07-10 MED ORDER — LORAZEPAM 0.5 MG PO TABS
0.5000 mg | ORAL_TABLET | Freq: Two times a day (BID) | ORAL | Status: DC | PRN
Start: 2022-07-10 — End: 2022-07-14
  Administered 2022-07-11 – 2022-07-13 (×4): 0.5 mg via ORAL
  Filled 2022-07-10 (×4): qty 1

## 2022-07-10 MED ORDER — PHENYLEPHRINE HCL (PRESSORS) 10 MG/ML IV SOLN
INTRAVENOUS | Status: DC | PRN
  Administered 2022-07-10: 160 ug via INTRAVENOUS

## 2022-07-10 MED ORDER — EPHEDRINE SULFATE (PRESSORS) 50 MG/ML IJ SOLN
INTRAMUSCULAR | Status: DC | PRN
  Administered 2022-07-10: 10 mg via INTRAVENOUS
  Administered 2022-07-10: 5 mg via INTRAVENOUS

## 2022-07-10 MED ORDER — POLYVINYL ALCOHOL 1.4 % OP SOLN: 1.0000 [drp] | OPHTHALMIC | Status: DC | PRN

## 2022-07-10 MED ORDER — ACETAMINOPHEN 10 MG/ML IV SOLN
INTRAVENOUS | Status: DC | PRN
  Administered 2022-07-10: 1000 mg via INTRAVENOUS

## 2022-07-10 MED ORDER — BUPIVACAINE-EPINEPHRINE (PF) 0.25% -1:200000 IJ SOLN
INTRAMUSCULAR | Status: AC
  Filled 2022-07-10: qty 30

## 2022-07-10 MED ORDER — FENTANYL CITRATE (PF) 100 MCG/2ML IJ SOLN: 25.0000 ug | INTRAMUSCULAR | Status: DC | PRN

## 2022-07-10 MED ORDER — SENNOSIDES-DOCUSATE SODIUM 8.6-50 MG PO TABS: 1.0000 | ORAL_TABLET | Freq: Every evening | ORAL | Status: DC | PRN

## 2022-07-10 MED ORDER — PROPOFOL 10 MG/ML IV BOLUS
INTRAVENOUS | Status: DC | PRN
  Administered 2022-07-10: 30 mg via INTRAVENOUS

## 2022-07-10 MED ORDER — ACETAMINOPHEN 10 MG/ML IV SOLN
INTRAVENOUS | Status: AC
  Filled 2022-07-10: qty 100

## 2022-07-10 MED ORDER — ARFORMOTEROL TARTRATE 15 MCG/2ML IN NEBU
15.0000 ug | INHALATION_SOLUTION | Freq: Two times a day (BID) | RESPIRATORY_TRACT | Status: DC
  Administered 2022-07-10 – 2022-07-13 (×6): 15 ug via RESPIRATORY_TRACT
  Filled 2022-07-10 (×7): qty 2

## 2022-07-10 MED ORDER — MORPHINE SULFATE (PF) 2 MG/ML IV SOLN
0.5000 mg | INTRAVENOUS | Status: DC | PRN
  Administered 2022-07-10: 1 mg via INTRAVENOUS
  Filled 2022-07-10: qty 1

## 2022-07-10 MED ORDER — SODIUM CHLORIDE 0.9 % IV SOLN: INTRAVENOUS | Status: DC

## 2022-07-10 MED ORDER — ONDANSETRON HCL 4 MG PO TABS: 4.0000 mg | ORAL_TABLET | Freq: Four times a day (QID) | ORAL | Status: DC | PRN

## 2022-07-10 MED ORDER — PROPOFOL 500 MG/50ML IV EMUL
INTRAVENOUS | Status: DC | PRN
  Administered 2022-07-10: 150 ug/kg/min via INTRAVENOUS

## 2022-07-10 MED ORDER — HYDROCODONE-ACETAMINOPHEN 5-325 MG PO TABS
1.0000 | ORAL_TABLET | ORAL | Status: DC | PRN
  Administered 2022-07-10: 1 via ORAL
  Administered 2022-07-10: 2 via ORAL
  Administered 2022-07-11 – 2022-07-12 (×3): 1 via ORAL
  Filled 2022-07-10 (×4): qty 1
  Filled 2022-07-10: qty 2
  Filled 2022-07-10: qty 1
  Filled 2022-07-10: qty 2

## 2022-07-10 MED ORDER — AMLODIPINE BESYLATE 5 MG PO TABS
5.0000 mg | ORAL_TABLET | Freq: Every evening | ORAL | Status: DC
  Administered 2022-07-10: 5 mg via ORAL
  Filled 2022-07-10: qty 1

## 2022-07-10 MED ORDER — LOSARTAN POTASSIUM 50 MG PO TABS
100.0000 mg | ORAL_TABLET | Freq: Every evening | ORAL | Status: DC
  Administered 2022-07-10: 100 mg via ORAL
  Filled 2022-07-10: qty 2

## 2022-07-10 MED ORDER — CEFAZOLIN SODIUM-DEXTROSE 2-4 GM/100ML-% IV SOLN
INTRAVENOUS | Status: AC
  Filled 2022-07-10: qty 100

## 2022-07-10 MED ORDER — OXYCODONE HCL 5 MG PO TABS: 5.0000 mg | ORAL_TABLET | Freq: Once | ORAL | Status: DC | PRN

## 2022-07-10 MED ORDER — LORATADINE 10 MG PO TABS
10.0000 mg | ORAL_TABLET | Freq: Every day | ORAL | Status: DC
  Administered 2022-07-11 – 2022-07-13 (×3): 10 mg via ORAL
  Filled 2022-07-10 (×3): qty 1

## 2022-07-10 MED ORDER — UMECLIDINIUM BROMIDE 62.5 MCG/ACT IN AEPB
1.0000 | INHALATION_SPRAY | Freq: Every day | RESPIRATORY_TRACT | Status: DC
  Administered 2022-07-11 – 2022-07-12 (×2): 1 via RESPIRATORY_TRACT
  Filled 2022-07-10: qty 7

## 2022-07-10 MED ORDER — PROPOFOL 10 MG/ML IV BOLUS
INTRAVENOUS | Status: AC
  Filled 2022-07-10: qty 20

## 2022-07-10 MED ORDER — ZOLPIDEM TARTRATE 5 MG PO TABS: 5.0000 mg | ORAL_TABLET | Freq: Every evening | ORAL | Status: DC | PRN

## 2022-07-10 MED ORDER — CEFAZOLIN SODIUM-DEXTROSE 2-4 GM/100ML-% IV SOLN
2.0000 g | INTRAVENOUS | Status: AC
  Administered 2022-07-10: 2 g via INTRAVENOUS

## 2022-07-10 MED ORDER — 0.9 % SODIUM CHLORIDE (POUR BTL) OPTIME
TOPICAL | Status: DC | PRN
  Administered 2022-07-10: 1000 mL

## 2022-07-10 MED ORDER — HEMOSTATIC AGENTS (NO CHARGE) OPTIME
TOPICAL | Status: DC | PRN
  Administered 2022-07-10: 2 via TOPICAL

## 2022-07-10 MED ORDER — ALBUTEROL SULFATE HFA 108 (90 BASE) MCG/ACT IN AERS
2.0000 | INHALATION_SPRAY | Freq: Four times a day (QID) | RESPIRATORY_TRACT | Status: DC | PRN
Start: 2022-07-10 — End: 2022-07-10

## 2022-07-10 MED ORDER — SODIUM CHLORIDE FLUSH 0.9 % IV SOLN
INTRAVENOUS | Status: AC
  Filled 2022-07-10: qty 40

## 2022-07-10 MED ORDER — LACTATED RINGERS IV SOLN: INTRAVENOUS | Status: DC | PRN

## 2022-07-10 MED ORDER — PHENOL 1.4 % MT LIQD: 1.0000 | OROMUCOSAL | Status: DC | PRN

## 2022-07-10 MED ORDER — TRAMADOL HCL 50 MG PO TABS
50.0000 mg | ORAL_TABLET | Freq: Four times a day (QID) | ORAL | Status: DC
  Administered 2022-07-10 – 2022-07-12 (×2): 50 mg via ORAL
  Filled 2022-07-10 (×5): qty 1

## 2022-07-10 MED ORDER — DEXMEDETOMIDINE HCL IN NACL 200 MCG/50ML IV SOLN
INTRAVENOUS | Status: DC | PRN
  Administered 2022-07-10 (×2): 4 ug via INTRAVENOUS

## 2022-07-10 MED ORDER — DIPHENHYDRAMINE HCL 12.5 MG/5ML PO ELIX: 12.5000 mg | ORAL_SOLUTION | ORAL | Status: DC | PRN

## 2022-07-10 MED ORDER — PANTOPRAZOLE SODIUM 40 MG PO TBEC
40.0000 mg | DELAYED_RELEASE_TABLET | Freq: Every day | ORAL | Status: DC
  Administered 2022-07-11 – 2022-07-13 (×3): 40 mg via ORAL
  Filled 2022-07-10 (×3): qty 1

## 2022-07-10 MED ORDER — CARVEDILOL 3.125 MG PO TABS
6.2500 mg | ORAL_TABLET | Freq: Two times a day (BID) | ORAL | Status: DC
  Filled 2022-07-10 (×4): qty 2

## 2022-07-10 MED ORDER — CEFAZOLIN SODIUM-DEXTROSE 1-4 GM/50ML-% IV SOLN
1.0000 g | Freq: Four times a day (QID) | INTRAVENOUS | Status: AC
  Administered 2022-07-10 (×2): 1 g via INTRAVENOUS
  Filled 2022-07-10 (×2): qty 50

## 2022-07-10 MED ORDER — CHLORHEXIDINE GLUCONATE 0.12 % MT SOLN
OROMUCOSAL | Status: AC
  Administered 2022-07-10: 15 mL via OROMUCOSAL
  Filled 2022-07-10: qty 15

## 2022-07-10 MED ORDER — ENOXAPARIN SODIUM 40 MG/0.4ML IJ SOSY
40.0000 mg | PREFILLED_SYRINGE | INTRAMUSCULAR | Status: DC
  Administered 2022-07-11 – 2022-07-13 (×3): 40 mg via SUBCUTANEOUS
  Filled 2022-07-10 (×3): qty 0.4

## 2022-07-10 MED ORDER — ADULT MULTIVITAMIN W/MINERALS CH
1.0000 | ORAL_TABLET | ORAL | Status: DC
  Administered 2022-07-11 – 2022-07-13 (×2): 1 via ORAL
  Filled 2022-07-10 (×2): qty 1

## 2022-07-10 MED ORDER — ORAL CARE MOUTH RINSE: 15.0000 mL | Freq: Once | OROMUCOSAL | Status: AC

## 2022-07-10 MED ORDER — PANTOPRAZOLE SODIUM 40 MG PO TBEC: 40.0000 mg | DELAYED_RELEASE_TABLET | ORAL | Status: DC

## 2022-07-10 MED ORDER — TRIAMCINOLONE ACETONIDE 55 MCG/ACT NA AERO
1.0000 | INHALATION_SPRAY | Freq: Every day | NASAL | Status: DC
  Administered 2022-07-13: 1 via NASAL
  Filled 2022-07-10: qty 10.8

## 2022-07-10 MED ORDER — LEVOTHYROXINE SODIUM 50 MCG PO TABS
75.0000 ug | ORAL_TABLET | Freq: Every day | ORAL | Status: DC
  Administered 2022-07-11 – 2022-07-13 (×3): 75 ug via ORAL
  Filled 2022-07-10 (×3): qty 1

## 2022-07-10 MED ORDER — CYCLOBENZAPRINE HCL 10 MG PO TABS
10.0000 mg | ORAL_TABLET | Freq: Three times a day (TID) | ORAL | Status: DC | PRN
  Administered 2022-07-10 – 2022-07-12 (×2): 10 mg via ORAL
  Filled 2022-07-10 (×2): qty 1

## 2022-07-10 MED ORDER — OXYCODONE HCL 5 MG/5ML PO SOLN: 5.0000 mg | Freq: Once | ORAL | Status: DC | PRN

## 2022-07-10 MED ORDER — DOCUSATE SODIUM 100 MG PO CAPS
100.0000 mg | ORAL_CAPSULE | Freq: Two times a day (BID) | ORAL | Status: DC
  Administered 2022-07-10 – 2022-07-13 (×7): 100 mg via ORAL
  Filled 2022-07-10 (×7): qty 1

## 2022-07-10 MED ORDER — METOCLOPRAMIDE HCL 5 MG/ML IJ SOLN: 5.0000 mg | Freq: Three times a day (TID) | INTRAMUSCULAR | Status: DC | PRN

## 2022-07-10 MED ORDER — ALUM & MAG HYDROXIDE-SIMETH 200-200-20 MG/5ML PO SUSP: 30.0000 mL | ORAL | Status: DC | PRN

## 2022-07-10 MED ORDER — BUPIVACAINE HCL (PF) 0.5 % IJ SOLN
INTRAMUSCULAR | Status: DC | PRN
  Administered 2022-07-10: 2.5 mL

## 2022-07-10 MED ORDER — SURGIPHOR WOUND IRRIGATION SYSTEM - OPTIME: TOPICAL | Status: DC | PRN

## 2022-07-10 MED ORDER — FLEET ENEMA 7-19 GM/118ML RE ENEM: 1.0000 | ENEMA | Freq: Once | RECTAL | Status: DC | PRN

## 2022-07-10 MED ORDER — SODIUM CHLORIDE (PF) 0.9 % IJ SOLN
INTRAMUSCULAR | Status: DC | PRN
  Administered 2022-07-10: 90 mL via INTRAMUSCULAR

## 2022-07-10 SURGICAL SUPPLY — 61 items
APL PRP STRL LF DISP 70% ISPRP (MISCELLANEOUS) ×1
BLADE SAGITTAL AGGR TOOTH XLG (BLADE) ×2 IMPLANT
BNDG CMPR 5X6 CHSV STRCH STRL (GAUZE/BANDAGES/DRESSINGS) ×3
BNDG COHESIVE 6X5 TAN ST LF (GAUZE/BANDAGES/DRESSINGS) ×6 IMPLANT
BOWL CEMENT MIXING ADV NOZZLE (MISCELLANEOUS) ×1 IMPLANT
CANISTER WOUND CARE 500ML ATS (WOUND CARE) ×2 IMPLANT
CEMENT BONE 40GM (Cement) ×2 IMPLANT
CEMENT RESTRICTOR DEPUY SZ 3 (Cement) ×1 IMPLANT
CHLORAPREP W/TINT 26 (MISCELLANEOUS) ×2 IMPLANT
COVER BACK TABLE REUSABLE LG (DRAPES) ×2 IMPLANT
CUP ACETAB VERSA DBL 28X58 DMI (Orthopedic Implant) ×1 IMPLANT
DRAPE 3/4 80X56 (DRAPES) ×6 IMPLANT
DRAPE C-ARM XRAY 36X54 (DRAPES) ×2 IMPLANT
DRAPE INCISE IOBAN 66X60 STRL (DRAPES) IMPLANT
DRAPE POUCH INSTRU U-SHP 10X18 (DRAPES) ×2 IMPLANT
DRESSING SURGICEL FIBRLLR 1X2 (HEMOSTASIS) ×2 IMPLANT
DRSG MEPILEX SACRM 8.7X9.8 (GAUZE/BANDAGES/DRESSINGS) ×2 IMPLANT
DRSG SURGICEL FIBRILLAR 1X2 (HEMOSTASIS) ×8
ELECT BLADE 6.5 EXT (BLADE) ×2 IMPLANT
ELECT REM PT RETURN 9FT ADLT (ELECTROSURGICAL) ×2
ELECTRODE REM PT RTRN 9FT ADLT (ELECTROSURGICAL) ×1 IMPLANT
GLOVE BIOGEL PI IND STRL 9 (GLOVE) ×1 IMPLANT
GLOVE BIOGEL PI INDICATOR 9 (GLOVE) ×1
GLOVE SURG SYN 9.0  PF PI (GLOVE) ×4
GLOVE SURG SYN 9.0 PF PI (GLOVE) ×2 IMPLANT
GOWN SRG 2XL LVL 4 RGLN SLV (GOWNS) ×1 IMPLANT
GOWN STRL NON-REIN 2XL LVL4 (GOWNS) ×2
GOWN STRL REUS W/ TWL LRG LVL3 (GOWN DISPOSABLE) ×1 IMPLANT
GOWN STRL REUS W/TWL LRG LVL3 (GOWN DISPOSABLE) ×2
HIP FEM HD S 28 (Head) ×1 IMPLANT
HIP STEM FEM 2 STD (Stem) ×1 IMPLANT
HOLDER FOLEY CATH W/STRAP (MISCELLANEOUS) ×2 IMPLANT
HOOD PEEL AWAY FLYTE STAYCOOL (MISCELLANEOUS) ×2 IMPLANT
KIT PREVENA INCISION MGT 13 (CANNISTER) ×2 IMPLANT
KNIFE SCULPS 14X20 (INSTRUMENTS) ×1 IMPLANT
MANIFOLD NEPTUNE II (INSTRUMENTS) ×2 IMPLANT
MAT ABSORB  FLUID 56X50 GRAY (MISCELLANEOUS) ×2
MAT ABSORB FLUID 56X50 GRAY (MISCELLANEOUS) ×1 IMPLANT
NDL SPNL 20GX3.5 QUINCKE YW (NEEDLE) ×2 IMPLANT
NEEDLE SPNL 20GX3.5 QUINCKE YW (NEEDLE) ×4 IMPLANT
NOZZLE FLEX THIN (MISCELLANEOUS) ×1 IMPLANT
NS IRRIG 1000ML POUR BTL (IV SOLUTION) ×2 IMPLANT
PACK HIP COMPR (MISCELLANEOUS) ×2 IMPLANT
PRESSURIZER CEMENT PROX FEM SM (MISCELLANEOUS) ×1 IMPLANT
SCALPEL PROTECTED #10 DISP (BLADE) ×4 IMPLANT
SHELL ACETABULAR SZ0 58MM (Shell) ×1 IMPLANT
SOLUTION IRRIG SURGIPHOR (IV SOLUTION) ×2 IMPLANT
STAPLER SKIN PROX 35W (STAPLE) ×2 IMPLANT
STRAP SAFETY 5IN WIDE (MISCELLANEOUS) ×2 IMPLANT
SUT DVC 2 QUILL PDO  T11 36X36 (SUTURE) ×2
SUT DVC 2 QUILL PDO T11 36X36 (SUTURE) ×1 IMPLANT
SUT SILK 0 (SUTURE) ×2
SUT SILK 0 30XBRD TIE 6 (SUTURE) ×1 IMPLANT
SUT V-LOC 90 ABS DVC 3-0 CL (SUTURE) ×2 IMPLANT
SUT VIC AB 1 CT1 36 (SUTURE) ×2 IMPLANT
SYR 50ML LL SCALE MARK (SYRINGE) ×4 IMPLANT
SYR BULB IRRIG 60ML STRL (SYRINGE) ×2 IMPLANT
TAPE MICROFOAM 4IN (TAPE) IMPLANT
TOWEL OR 17X26 4PK STRL BLUE (TOWEL DISPOSABLE) IMPLANT
TRAY FOLEY MTR SLVR 16FR STAT (SET/KITS/TRAYS/PACK) ×2 IMPLANT
WATER STERILE IRR 1000ML POUR (IV SOLUTION) ×2 IMPLANT

## 2022-07-10 NOTE — H&P (Signed)
Chief Complaint  Patient presents with  Pre-op Exam  Scheduled for Right THA 07/10/22 with Dr. Rudene Christians    History of the Present Illness: Christy Richardson is a 87 y.o. female here today for history and physical for right total hip arthroplasty with Dr. Hessie Knows on 07/10/2022. Patient has had at least 10 years of right hip pain that has increased over the last year. She describes groin, buttocks and lower back pain on the right side along with pain in her thigh down to the knee. Her x-rays show severe advanced right hip osteoarthritis, her pain is severe and debilitating and interfering with her ability to walk and perform activities of daily living. She has a hard time getting in and out of her car as well as going to the grocery store to get groceries. She has been using a cane recently with some relief. Patient has underwent a successful left total hip arthroplasty.  The patient states she has bronchiectasis, and she is on an inhaler and nebulizer. She is not on any oxygen yet. Her pulmonologist is Dr. Valeta Harms in [place]. She has seen Dr. Viona Gilmore and Dr. Leonides Schanz.  I have reviewed past medical, surgical, social and family history, and allergies as documented in the EMR.  Past Medical History: Past Medical History:  Diagnosis Date  Anxiety  Bronchiectasis (CMS-HCC)  Hyperlipidemia  Hypertension  Hypothyroidism   Past Surgical History: Past Surgical History:  Procedure Laterality Date  HYSTERECTOMY  MASTOIDECTOMY   Past Family History: Family History  Problem Relation Age of Onset  High blood pressure (Hypertension) Mother  Heart disease Mother  Lung cancer Mother  Diabetes type II Father   Medications: Current Outpatient Medications Ordered in Epic  Medication Sig Dispense Refill  acetaminophen (TYLENOL) 500 MG tablet Take 500 mg by mouth as needed  albuterol (PROVENTIL) 2.5 mg /3 mL (0.083 %) nebulizer solution Inhale 2.5 mg into the lungs every 6 (six) hours as needed  amLODIPine  (NORVASC) 5 MG tablet Take 1 tablet by mouth once daily  brimonidine (ALPHAGAN) 0.2 % ophthalmic solution Place 1 drop into both eyes 2 (two) times daily  carvediloL (COREG) 12.5 MG tablet Take by mouth as directed  cyclobenzaprine (FLEXERIL) 10 MG tablet Take 10 mg by mouth 3 (three) times daily as needed  dorzolamide (TRUSOPT) 2 % ophthalmic solution Place 1 drop into both eyes 2 (two) times daily  levothyroxine (SYNTHROID) 50 MCG tablet Take 1 tablet by mouth once daily  LORazepam (ATIVAN) 0.5 MG tablet Take 0.5 tablets by mouth 2 (two) times daily  losartan (COZAAR) 100 MG tablet Take 1 tablet by mouth once daily  multivitamin with minerals tablet Take 1 tablet by mouth once daily  pantoprazole (PROTONIX) 40 MG DR tablet Take 1 tablet by mouth once daily  pravastatin (PRAVACHOL) 40 MG tablet Take 1 tablet by mouth once daily  sertraline (ZOLOFT) 100 MG tablet Take 1 tablet by mouth at bedtime  tiotropium-olodateroL (STIOLTO RESPIMAT) 2.5-2.5 mcg/actuation inhaler Inhale 2 inhalations into the lungs once daily  traMADoL (ULTRAM) 50 mg tablet Take 1 tablet by mouth 2 (two) times daily  traZODone (DESYREL) 50 MG tablet Take 1 tablet by mouth at bedtime  triamcinolone (NASACORT AQ) 55 mcg nasal spray Place 1 spray into one nostril once daily   No current Epic-ordered facility-administered medications on file.   Allergies: Allergies  Allergen Reactions  Prednisone Rash  REACTION: Increase eye pressure with gloucoma. Broke out in rash after injection per Dr. Percell Miller  Penicillins Rash  REACTION: Rash    Body mass index is 23.08 kg/m.  Review of Systems: A comprehensive 14 point ROS was performed, reviewed, and the pertinent orthopaedic findings are documented in the HPI.  Vitals:  06/26/22 1504  BP: 122/78    General Physical Examination:  General:  Well developed, well nourished, no apparent distress, normal affect, normal gait with antalgic gait  HEENT: Head normocephalic,  atraumatic, PERRL.   Abdomen: Soft, non tender, non distended, Bowel sounds present.  Heart: Examination of the heart reveals regular, rate, and rhythm. There is no murmur noted on ascultation. There is a normal apical pulse.  Lungs: Lungs are clear to auscultation. There is no wheeze, rhonchi, or crackles. There is normal expansion of bilateral chest walls.   Right lower extremity: On the exam, right hip has 5 degrees internal rotation and 20 degrees external with pain in the groin. Patient ambulates with a cane. No swelling warmth erythema or edema leg. Knee is stable to valgus and varus stress test  Radiographs: AP pelvis and lateral x-rays of the right hip reviewed by me today from 06/06/2022. These show fairly significant degenerative arthritis with marked central hip arthritis, osteophytes all around the femoral head. Lateral view shows large osteophytes on the inferior head consistent with severe central hip arthritis. She also has a prior left total hip. It appears to be a [employer]ry[employer] implant that was recalled with a modular neck. It appears to be stable.  X-ray Impression Severe right hip central osteoarthritis.  Assessment: ICD-10-CM  1. Arthritis of right hip M16.34   Plan: 87 year old female with advanced right hip osteoarthritis with severe debilitating pain. Pain is interfering with her quality of life and activities of daily living. Risks, benefits, complications of a right total hip arthroplasty have been discussed with the patient. Patient has agreed and consented procedure Dr. Hessie Knows on 07/10/2022.  Surgical Risks:  The nature of the condition and the proposed procedure has been reviewed in detail with the patient. Surgical versus non-surgical options and prognosis for recovery have been reviewed and the inherent risks and benefits of each have been discussed including the risks of infection, bleeding, injury to nerves/blood vessels/tendons, incomplete relief of symptoms,  persisting pain and/or stiffness, loss of function, complex regional pain syndrome, failure of the procedure, as appropriate.   Electronically signed by Feliberto Gottron, Margaret at 06/26/2022 3:42 PM EDT   Reviewed  H+P. No changes noted.

## 2022-07-10 NOTE — Anesthesia Postprocedure Evaluation (Signed)
Anesthesia Post Note  Patient: JIMESHA RISING  Procedure(s) Performed: TOTAL HIP ARTHROPLASTY ANTERIOR APPROACH (Right: Hip)  Patient location during evaluation: PACU Anesthesia Type: Combined General/Spinal Level of consciousness: awake and alert Pain management: pain level controlled Vital Signs Assessment: post-procedure vital signs reviewed and stable Respiratory status: spontaneous breathing, nonlabored ventilation, respiratory function stable and patient connected to nasal cannula oxygen Cardiovascular status: blood pressure returned to baseline and stable Postop Assessment: no apparent nausea or vomiting Anesthetic complications: no   No notable events documented.   Last Vitals:  Vitals:   07/10/22 1316 07/10/22 1330  BP: 130/71 135/77  Pulse: 69 (!) 49  Resp: 15 16  Temp:    SpO2: 99% 96%    Last Pain:  Vitals:   07/10/22 1300  TempSrc:   PainSc: 0-No pain                 Dimas Millin

## 2022-07-10 NOTE — Evaluation (Signed)
Physical Therapy Evaluation Patient Details Name: Christy Richardson MRN: [id number] DOB: 28-Aug-1935 Today's Date: 07/10/2022  History of Present Illness  Patient is a 87 year old female with arthritis of right hip s/p right total hip arthroplasty anterior approach. History of bronchiectasis, dyspnea on exertion, IBS, previous left THA.  Clinical Impression  Patient is agreeable to PT. Supportive daughters at bedside. The patient reports she was independent with ambulation without assistive device at baseline, however had antalgic gait. The patient is hopeful to discharge home tomorrow.  Therapeutic exercises initiated today which patient was able to perform independently with cues for technique. Exercises handout provided. The patient sat up on the edge of bed with mild dizziness reported. Sensation has not fully returned in BLE and standing was deferred for safety at this time. Anticipate patient will be able to get out of bed and ambulate when sensation fully returns. PT will follow up in the morning to maximize functional independence in preparation for home discharge.      Recommendations for follow up therapy are one component of a multi-disciplinary discharge planning process, led by the attending physician.  Recommendations may be updated based on patient status, additional functional criteria and insurance authorization.  Follow Up Recommendations Home health PT      Assistance Recommended at Discharge Set up Supervision/Assistance  Patient can return home with the following  Help with stairs or ramp for entrance;Assist for transportation    Equipment Recommendations None recommended by PT  Recommendations for Other Services       Functional Status Assessment Patient has had a recent decline in their functional status and demonstrates the ability to make significant improvements in function in a reasonable and predictable amount of time.     Precautions / Restrictions  Precautions Precautions: Fall Restrictions Weight Bearing Restrictions: No      Mobility  Bed Mobility Overal bed mobility: Needs Assistance Bed Mobility: Supine to Sit, Sit to Supine     Supine to sit: Min guard Sit to supine: Min assist   General bed mobility comments: verbal cues for technique. intermittent assistance for RLE support to return to bed    Transfers                   General transfer comment: not assessed for safety due to ongoing leg numbness in bilateral LE    Ambulation/Gait                  Stairs            Wheelchair Mobility    Modified Rankin (Stroke Patients Only)       Balance Overall balance assessment: Needs assistance Sitting-balance support: Feet supported Sitting balance-Leahy Scale: Fair Sitting balance - Comments: no external support required                                     Pertinent Vitals/Pain Pain Assessment Pain Assessment: 0-10 Pain Score: 5  Pain Location: R hip Pain Descriptors / Indicators: Discomfort Pain Intervention(s): Limited activity within patient's tolerance, Premedicated before session, Monitored during session, Repositioned, Ice applied    Home Living Family/patient expects to be discharged to:: Private residence Living Arrangements: Alone Available Help at Discharge: Family Type of Home: House Home Access: Stairs to enter Entrance Stairs-Rails:  (one rail in the middle) Technical brewer of Steps: 2   Home Layout: One level Home Equipment: Conservation officer, nature (  2 wheels);BSC/3in1      Prior Function Prior Level of Function : Independent/Modified Independent             Mobility Comments: independent but with antalgic gait ADLs Comments: independent     Hand Dominance        Extremity/Trunk Assessment   Upper Extremity Assessment Upper Extremity Assessment: Overall WFL for tasks assessed    Lower Extremity Assessment Lower Extremity  Assessment: LLE deficits/detail;RLE deficits/detail RLE Deficits / Details: patient able to SLR independently. she can activate hip/knee/ankle movement in gravity eliminated position RLE Sensation: decreased light touch (impaired but improving since surgery) LLE Deficits / Details: patient able to SLR independently. she can activate hip/knee/ankle movement in gravity eliminated position LLE Sensation: decreased light touch (impaired but improving since surgery)       Communication   Communication: No difficulties  Cognition Arousal/Alertness: Awake/alert Behavior During Therapy: WFL for tasks assessed/performed Overall Cognitive Status: Within Functional Limits for tasks assessed                                          General Comments      Exercises Total Joint Exercises Ankle Circles/Pumps: AROM, Strengthening, Both, 10 reps, Supine Quad Sets: AROM, Strengthening, Right, 10 reps, Supine Heel Slides: Strengthening, Right, 10 reps, Supine, AROM Hip ABduction/ADduction: Strengthening, Both, 10 reps, Supine, AROM Straight Leg Raises: AROM, Strengthening, Right, 5 reps, Supine Long Arc Quad: AROM, Strengthening, Right, 10 reps, Supine Other Exercises Other Exercises: written HEP provided. verbal cues for exercise technique   Assessment/Plan    PT Assessment Patient needs continued PT services  PT Problem List Decreased strength;Decreased range of motion;Decreased activity tolerance;Decreased mobility;Decreased balance;Pain;Decreased safety awareness;Decreased knowledge of precautions       PT Treatment Interventions DME instruction;Gait training;Stair training;Functional mobility training;Therapeutic activities;Therapeutic exercise;Balance training;Neuromuscular re-education;Patient/family education    PT Goals (Current goals can be found in the Care Plan section)  Acute Rehab PT Goals Patient Stated Goal: to return home tomorrow PT Goal Formulation: With  patient Time For Goal Achievement: 07/24/22 Potential to Achieve Goals: Good    Frequency BID     Co-evaluation               AM-PAC PT "6 Clicks" Mobility  Outcome Measure Help needed turning from your back to your side while in a flat bed without using bedrails?: A Little Help needed moving from lying on your back to sitting on the side of a flat bed without using bedrails?: A Little Help needed moving to and from a bed to a chair (including a wheelchair)?: A Little Help needed standing up from a chair using your arms (e.g., wheelchair or bedside chair)?: A Little Help needed to walk in hospital room?: A Little Help needed climbing 3-5 steps with a railing? : A Little 6 Click Score: 18    End of Session   Activity Tolerance: Patient tolerated treatment well Patient left: in bed;with call bell/phone within reach;with bed alarm set;with SCD's reapplied;with family/visitor present (ice pack applied R hip) Nurse Communication: Mobility status PT Visit Diagnosis: Unsteadiness on feet (R26.81);Muscle weakness (generalized) (M62.81)    Time: 0454-0981 PT Time Calculation (min) (ACUTE ONLY): 30 min   Charges:   PT Evaluation $PT Eval Low Complexity: 1 Low PT Treatments $Therapeutic Exercise: 8-22 mins        Minna Merritts, PT, MPT   Orson Gear  Quentin Cornwall 07/10/2022, 3:45 PM

## 2022-07-10 NOTE — Op Note (Signed)
07/10/2022  12:12 PM  PATIENT:  Christy Richardson  87 y.o. female  PRE-OPERATIVE DIAGNOSIS:  Arthritis of right hip  M16.11  POST-OPERATIVE DIAGNOSIS:  Arthritis of right hip  M16.11  PROCEDURE:  Procedure(s): TOTAL HIP ARTHROPLASTY ANTERIOR APPROACH (Right)  SURGEON: Laurene Footman, MD  ASSISTANTS: None  ANESTHESIA:   spinal  EBL:  Total I/O In: 1100 [I.V.:1000; IV Piggyback:100] Out: 250 [Urine:250]  BLOOD ADMINISTERED:none  DRAINS:  Incisional wound VAC    LOCAL MEDICATIONS USED:  MARCAINE    and OTHER Exparel  SPECIMEN:  Source of Specimen:    Right femoral head  DISPOSITION OF SPECIMEN:  PATHOLOGY  COUNTS:  YES  TOURNIQUET:  * No tourniquets in log *  IMPLANTS: Medacta cemented AMIS 2 standard stem with 58 mm Mpact DM cup and liner with metal S 20 mm head  DICTATION: .Dragon Dictation   The patient was brought to the operating room and after spinal anesthesia was obtained patient was placed on the operative table with the ipsilateral foot into the Medacta attachment, contralateral leg on a well-padded table. C-arm was brought in and preop template x-ray taken. After prepping and draping in usual sterile fashion appropriate patient identification and timeout procedures were completed. Anterior approach to the hip was obtained and centered over the greater trochanter and TFL muscle. The subcutaneous tissue was incised hemostasis being achieved by electrocautery. TFL fascia was incised and the muscle retracted laterally deep retractor placed. The lateral femoral circumflex vessels were identified and ligated. The anterior capsule was exposed and a capsulotomy performed. The neck was identified and a femoral neck cut carried out with a saw. The head was removed without difficulty and showed sclerotic femoral head and acetabulum. Reaming was carried out to 57 mm and a 58 mm cup trial gave appropriate tightness to the acetabular component a 58 DM cup was impacted into position. The  leg was then externally rotated and ischiofemoral and pubofemoral releases carried out. The femur was sequentially broached to a size 3, size 3 standard with S head trials were placed and the final components chosen.  Canal was prepared by placing cement restrictor irrigating and drying the canal with sponges to protect the acetabulum.  Cement was placed with pressurization and then the 2 standard stem inserted with excess cement removed.  After the cemented set the sponges were removed from the acetabulum with excess cement or have been removed.  S head with 58 mm liner attached was impacted. . The hip was reduced and was stable the wound was thoroughly irrigated with fibrillar placed along the posterior capsule and medial neck. The deep fascia ws closed using a heavy Quill after infiltration of 30 cc of quarter percent Sensorcaine with epinephrine diluted with Exparel throughout the case .3-0 V-loc to close the skin with skin staples.  Incisional wound VAC applied and patient was sent to recovery in stable condition.   PLAN OF CARE: Admit for overnight observation

## 2022-07-10 NOTE — Anesthesia Preprocedure Evaluation (Signed)
Anesthesia Evaluation  Patient identified by MRN, date of birth, ID band Patient awake    Reviewed: Allergy & Precautions, NPO status , Patient's Chart, lab work & pertinent test results  History of Anesthesia Complications (+) PONV and history of anesthetic complications  Airway Mallampati: III  TM Distance: >3 FB Neck ROM: full    Dental  (+) Poor Dentition   Pulmonary shortness of breath and with exertion, neg sleep apnea, neg pneumonia , neg COPD, neg recent URI,    Pulmonary exam normal        Cardiovascular Exercise Tolerance: Good hypertension, (-) angina+ DOE  (-) CAD, (-) Past MI and (-) CABG Normal cardiovascular exam(-) dysrhythmias (-) Valvular Problems/Murmurs     Neuro/Psych PSYCHIATRIC DISORDERS negative neurological ROS     GI/Hepatic negative GI ROS, Neg liver ROS,   Endo/Other  diabetesHypothyroidism   Renal/GU   negative genitourinary   Musculoskeletal   Abdominal   Peds  Hematology negative hematology ROS (+)   Anesthesia Other Findings Past Medical History: 03/2007: Adenomatous polyp of colon No date: Allergic rhinitis No date: Anxiety     Comment:  a.) on BZO (lorazepam) PRN No date: Arthritis 1974: Blood transfusion No date: Bronchiectasis ([place]) No date: Cancer [facility])     Comment:  left arm No date: 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 Mild TR; b.) TTE 07/16/2019: EF 60-65%,               triv AR, mild MR/TR, G1DD. No date: Diet-controlled type 2 diabetes mellitus ([place]) No date: Diverticulosis of colon (without mention of hemorrhage) No date: DOE (dyspnea on exertion) No date: Dyspnea No date: Fatigue No date: Fecal incontinence No date: GERD (gastroesophageal reflux disease) No date: Glaucoma No date: Goiter      Comment:  right side No date: Heart murmur No date: History of stress test     Comment:  a. 01/2014 Lexi MV: no ischemia/infarct. No date: Hyperkalemia No date: Hyperlipidemia No date: Hypertension     Comment:  a. 11/2018 Renal artery duplex: No RAS.  No date: Hypothyroidism No date: IBS (irritable bowel syndrome) No date: Migraine No date: Mitral valve prolapse     Comment:  a. 01/2014 Echo: no MR/MVP noted. No date: Neck mass     Comment:  right side/ no airway problems/ per pt has had since               2005 (goiter) No date: Nonspecific abnormal electrocardiogram (ECG) (EKG) No date: Palpitations     Comment:  a. 01/2014 CardioNet: wore for 9 days (rash). Sinus tach               - 100's. No SVT/AF. No date: Pneumonia No date: Polyp, sigmoid colon No date: Post-operative nausea and vomiting No date: Pulmonary fibrosis ([facility]) No date: Pulmonary nodules No date: Recurrent UTI No date: Tachycardia No date: Tremor  Past Surgical History: No date: ABDOMINAL HYSTERECTOMY 09/19/2011: CARDIOVASCULAR STRESS TEST     Comment:  No scintigraphic evidence of inducible myocardial               ischemia. Pharmacological stress test without chest pain  or EKG changes for ischemia. No date: COLONOSCOPY No date: ESOPHAGOGASTRODUODENOSCOPY No date: MASTOIDECTOMY     Comment:  rt ear/ as a teenager No date: OOPHORECTOMY No date: SKIN CANCER EXCISION; Left     Comment:  arm 5/10: TOTAL HIP ARTHROPLASTY     Comment:  left 09/19/2011: TRANSTHORACIC ECHOCARDIOGRAM     Comment:  EF >81%, stage 1 diastolic dysfunction, mild tricuspid               valve regurg     Reproductive/Obstetrics negative OB ROS (+) Pregnancy                             Anesthesia Physical Anesthesia Plan  ASA: 3  Anesthesia Plan: General/Spinal   Post-op Pain Management:    Induction:   PONV Risk Score and Plan: 3 and Ondansetron and Propofol infusion  Airway  Management Planned: Natural Airway and Nasal Cannula  Additional Equipment:   Intra-op Plan:   Post-operative Plan:   Informed Consent: I have reviewed the patients History and Physical, chart, labs and discussed the procedure including the risks, benefits and alternatives for the proposed anesthesia with the patient or authorized representative who has indicated his/her understanding and acceptance.     Dental Advisory Given  Plan Discussed with: Anesthesiologist, CRNA and Surgeon  Anesthesia Plan Comments: (Patient reports no bleeding problems and no anticoagulant use.  Plan for spinal with backup GA  Patient consented for risks of anesthesia including but not limited to:  - adverse reactions to medications - damage to eyes, teeth, lips or other oral mucosa - nerve damage due to positioning  - risk of bleeding, infection and or nerve damage from spinal that could lead to paralysis - risk of headache or failed spinal - damage to teeth, lips or other oral mucosa - sore throat or hoarseness - damage to heart, brain, nerves, lungs, other parts of body or loss of life  Patient voiced understanding.)        Anesthesia Quick Evaluation

## 2022-07-10 NOTE — Transfer of Care (Signed)
Immediate Anesthesia Transfer of Care Note  Patient: Christy Richardson  Procedure(s) Performed: TOTAL HIP ARTHROPLASTY ANTERIOR APPROACH (Right: Hip)  Patient Location: PACU  Anesthesia Type:Spinal  Level of Consciousness: awake, alert  and oriented  Airway & Oxygen Therapy: Patient Spontanous Breathing and Patient connected to face mask oxygen  Post-op Assessment: Report given to RN and Post -op Vital signs reviewed and stable  Post vital signs: Reviewed and stable  Last Vitals:  Vitals Value Taken Time  BP 151/128 07/10/22 1213  Temp    Pulse 76 07/10/22 1215  Resp 23 07/10/22 1215  SpO2 99 % 07/10/22 1215  Vitals shown include unvalidated device data.  Last Pain:  Vitals:   07/10/22 0738  TempSrc: Temporal  PainSc: 0-No pain         Complications: No notable events documented.

## 2022-07-10 NOTE — Anesthesia Procedure Notes (Signed)
Spinal  Patient location during procedure: OR Start time: 07/10/2022 10:20 AM End time: 07/10/2022 10:27 AM Reason for block: surgical anesthesia Staffing Performed: resident/CRNA  Resident/CRNA: Nelda Marseille, CRNA Performed by: Nelda Marseille, CRNA Authorized by: Dimas Millin, MD   Preanesthetic Checklist Completed: patient identified, IV checked, site marked, risks and benefits discussed, surgical consent, monitors and equipment checked, pre-op evaluation and timeout performed Spinal Block Patient position: sitting Prep: ChloraPrep Patient monitoring: heart rate, continuous pulse ox, blood pressure and cardiac monitor Approach: midline Location: L3-4 Injection technique: single-shot Needle Needle type: Whitacre and Introducer  Needle gauge: 25 G Needle length: 9 cm Assessment Sensory level: T10 Events: CSF return Additional Notes Sterile aseptic technique used throughout the procedure.  Negative paresthesia. Negative blood return. Positive free-flowing CSF. Expiration date of kit checked and confirmed. Patient tolerated procedure well, without complications.

## 2022-07-10 NOTE — Anesthesia Procedure Notes (Signed)
Date/Time: 07/10/2022 10:30 AM  Performed by: Nelda Marseille, CRNAPre-anesthesia Checklist: Patient identified, Emergency Drugs available, Suction available, Patient being monitored and Timeout performed Oxygen Delivery Method: Simple face mask

## 2022-07-11 ENCOUNTER — Encounter: Payer: Self-pay | Admitting: Orthopedic Surgery

## 2022-07-11 DIAGNOSIS — Z96641 Presence of right artificial hip joint: Secondary | ICD-10-CM | POA: Diagnosis present

## 2022-07-11 LAB — CBC
HCT: 32.4 % — ABNORMAL LOW (ref 36.0–46.0)
Hemoglobin: 10.7 g/dL — ABNORMAL LOW (ref 12.0–15.0)
MCH: 29 pg (ref 26.0–34.0)
MCHC: 33 g/dL (ref 30.0–36.0)
MCV: 87.8 fL (ref 80.0–100.0)
Platelets: 165 10*3/uL (ref 150–400)
RBC: 3.69 MIL/uL — ABNORMAL LOW (ref 3.87–5.11)
RDW: 12.8 % (ref 11.5–15.5)
WBC: 7.8 10*3/uL (ref 4.0–10.5)
nRBC: 0 % (ref 0.0–0.2)

## 2022-07-11 LAB — BASIC METABOLIC PANEL
Anion gap: 2 — ABNORMAL LOW (ref 5–15)
BUN: 13 mg/dL (ref 8–23)
CO2: 24 mmol/L (ref 22–32)
Calcium: 8.2 mg/dL — ABNORMAL LOW (ref 8.9–10.3)
Chloride: 104 mmol/L (ref 98–111)
Creatinine, Ser: 0.87 mg/dL (ref 0.44–1.00)
GFR, Estimated: >60 mL/min (ref >60)
Glucose, Bld: 148 mg/dL — ABNORMAL HIGH (ref 70–99)
Potassium: 4.2 mmol/L (ref 3.5–5.1)
Sodium: 130 mmol/L — ABNORMAL LOW (ref 135–145)

## 2022-07-11 MED ORDER — SODIUM CHLORIDE 0.9 % IV BOLUS
1000.0000 mL | Freq: Once | INTRAVENOUS | Status: AC
  Administered 2022-07-11: 1000 mL via INTRAVENOUS

## 2022-07-11 MED ORDER — SODIUM CHLORIDE 0.9 % IV BOLUS
500.0000 mL | Freq: Once | INTRAVENOUS | Status: AC
  Administered 2022-07-11: 500 mL via INTRAVENOUS

## 2022-07-11 MED ORDER — FE FUMARATE-B12-VIT C-FA-IFC PO CAPS
1.0000 | ORAL_CAPSULE | Freq: Two times a day (BID) | ORAL | Status: DC
  Administered 2022-07-11 – 2022-07-13 (×6): 1 via ORAL
  Filled 2022-07-11 (×6): qty 1

## 2022-07-11 MED ORDER — SODIUM CHLORIDE 0.9 % IV SOLN: INTRAVENOUS | Status: DC

## 2022-07-11 MED ORDER — LOSARTAN POTASSIUM 50 MG PO TABS: 100.0000 mg | ORAL_TABLET | Freq: Every evening | ORAL | Status: DC

## 2022-07-11 MED ORDER — AMLODIPINE BESYLATE 5 MG PO TABS: 5.0000 mg | ORAL_TABLET | Freq: Every evening | ORAL | Status: DC

## 2022-07-11 NOTE — Progress Notes (Signed)
Physical Therapy Treatment Patient Details Name: CHESNIE CAPELL MRN: [id number] DOB: [birth year] Today's Date: 07/11/2022   History of Present Illness Patient is a 87 year old female with arthritis of right hip s/p right total hip arthroplasty anterior approach. History of bronchiectasis, dyspnea on exertion, IBS, previous left THA.    PT Comments    Patient is alert but groggy. No family at the bedside. She reports continued dizziness just at rest and feeling tired. Blood pressure 113/48. The patient has not had pain medication recently secondary to blood pressure issues. She was agreeable to in bed exercises for strengthening of RLE performed with intermittent assistance and cues for technique. Out of bed mobility deferred secondary to dizziness at rest, low blood pressure, and pain. Anticipate patient will require some assistance now with getting out of bed as she has not been able to progress mobility as anticipated secondary medical issues. PT will continue to follow to maximize independence and decrease caregiver burden.    Recommendations for follow up therapy are one component of a multi-disciplinary discharge planning process, led by the attending physician.  Recommendations may be updated based on patient status, additional functional criteria and insurance authorization.  Follow Up Recommendations  Home health PT (ongoing assessment based on progress)     Assistance Recommended at Discharge Intermittent Supervision/Assistance  Patient can return home with the following A little help with walking and/or transfers;A little help with bathing/dressing/bathroom;Assist for transportation;Help with stairs or ramp for entrance   Equipment Recommendations  None recommended by PT    Recommendations for Other Services       Precautions / Restrictions Precautions Precautions: Fall Restrictions Weight Bearing Restrictions: Yes RLE Weight Bearing: Weight bearing as tolerated      Mobility  Bed Mobility               General bed mobility comments: not assessed due to ongoing blood pressure issues and pain control    Transfers Overall transfer level: Needs assistance Equipment used: Rolling walker (2 wheels) Transfers: Bed to chair/wheelchair/BSC   Stand pivot transfers: Min assist (second person close for equipment management)         General transfer comment: patient reports feeling dizziness while seated on bed side commode. blood pressure 89/51. lifting assistance required for standing from bed side commode with cues for technique, safe hand placement, and use of rolling walker to pivot to the recliner chair. patient continues to report mild dizziness after transfer to the recliner with a second blood pressure taken at 79/50. nurse in the room and aware    Ambulation/Gait               General Gait Details: ambulation deferred for safety concerns due to low blood pressure and dizziness with upright activity   Stairs             Wheelchair Mobility    Modified Rankin (Stroke Patients Only)       Balance Overall balance assessment: Needs assistance Sitting-balance support: Feet supported, Bilateral upper extremity supported Sitting balance-Leahy Scale: Fair     Standing balance support: Reliant on assistive device for balance, Bilateral upper extremity supported Standing balance-Leahy Scale: Poor Standing balance comment: external support required for standing balance with use of rolling walker. limited by dizziness and fatigue with minimal activity                            Cognition Arousal/Alertness: Awake/alert (awake but  groggy) Behavior During Therapy: WFL for tasks assessed/performed Overall Cognitive Status: Within Functional Limits for tasks assessed                                 General Comments: patient able to follow single step commands. she was mildly letahrgic but reports  minimal sleep last night with pain control issues        Exercises Total Joint Exercises Ankle Circles/Pumps: AROM, Strengthening, Both, 10 reps, Supine Quad Sets: AROM, Strengthening, Right, 10 reps, Supine Short Arc Quad: AAROM, Strengthening, Right, 10 reps, Supine Heel Slides: AAROM, Strengthening, Right, 10 reps, Supine Hip ABduction/ADduction: AAROM, Strengthening, Right, 10 reps, Supine Other Exercises Other Exercises: verbal cues for exercise technique for strengthening. pain reported with AROM of right hip. RLE repositioned with ice pack in place at end of session    General Comments General comments (skin integrity, edema, etc.): patient has dizziness at rest in the bed. blood pressure 113/48- nurse alerted      Pertinent Vitals/Pain Pain Assessment Pain Assessment: Faces Faces Pain Scale: Hurts whole lot Pain Location: R hip with movement Pain Descriptors / Indicators: Discomfort, Grimacing Pain Intervention(s): Limited activity within patient's tolerance, Monitored during session, Repositioned, Ice applied    Home Living                          Prior Function            PT Goals (current goals can now be found in the care plan section) Acute Rehab PT Goals Patient Stated Goal: to feel better PT Goal Formulation: With patient Time For Goal Achievement: 07/24/22 Potential to Achieve Goals: Good Progress towards PT goals: Not progressing toward goals - comment    Frequency    BID      PT Plan Current plan remains appropriate    Co-evaluation              AM-PAC PT "6 Clicks" Mobility   Outcome Measure  Help needed turning from your back to your side while in a flat bed without using bedrails?: A Little Help needed moving from lying on your back to sitting on the side of a flat bed without using bedrails?: A Little Help needed moving to and from a bed to a chair (including a wheelchair)?: A Little Help needed standing up from a chair  using your arms (e.g., wheelchair or bedside chair)?: A Little Help needed to walk in hospital room?: A Little Help needed climbing 3-5 steps with a railing? : A Lot 6 Click Score: 17    End of Session   Activity Tolerance: Treatment limited secondary to medical complications (Comment) (blood pressure) Patient left: in bed;with call bell/phone within reach;with bed alarm set;with SCD's reapplied (ice pack R hip) Nurse Communication: Mobility status (blood pressure) PT Visit Diagnosis: Unsteadiness on feet (R26.81);Muscle weakness (generalized) (M62.81)     Time: 0737-1062 PT Time Calculation (min) (ACUTE ONLY): 18 min  Charges:  $Therapeutic Exercise: 8-22 mins                    Minna Merritts, PT, MPT    Percell Locus 07/11/2022, 3:18 PM

## 2022-07-11 NOTE — Progress Notes (Signed)
Physical Therapy Treatment Patient Details Name: KERSTIN CRUSOE MRN: [id number] DOB: May 06, 1935 Today's Date: 07/11/2022   History of Present Illness Patient is a 87 year old female with arthritis of right hip s/p right total hip arthroplasty anterior approach. History of bronchiectasis, dyspnea on exertion, IBS, previous left THA.    PT Comments    Patient was limited by low blood pressure and dizziness with upright activity. She required minimal assistance for stand pivot transfer from bed side commode to recliner chair. Ambulation deferred for safety concerns with blood pressure of 89/51 and 79/50. Patient anticipated to get fluids and PT will follow up in the afternoon for progression of activity as able. Anticipate patient will not be ready for discharge home today from a mobility perspective. Will continue to assess most appropriate discharge recommendation based on progress with activity tolerance and independence.    Recommendations for follow up therapy are one component of a multi-disciplinary discharge planning process, led by the attending physician.  Recommendations may be updated based on patient status, additional functional criteria and insurance authorization.  Follow Up Recommendations  Home health PT (ongoing assessment based on progress)     Assistance Recommended at Discharge Set up Supervision/Assistance  Patient can return home with the following A little help with walking and/or transfers;A little help with bathing/dressing/bathroom;Assist for transportation;Help with stairs or ramp for entrance   Equipment Recommendations  None recommended by PT    Recommendations for Other Services       Precautions / Restrictions Precautions Precautions: Fall Restrictions Weight Bearing Restrictions: No     Mobility  Bed Mobility               General bed mobility comments: not assessed as patient sitting up on arrival to room on the bed side commode with OT and  family present    Transfers Overall transfer level: Needs assistance Equipment used: Rolling walker (2 wheels) Transfers: Bed to chair/wheelchair/BSC   Stand pivot transfers: Min assist (second person close for equipment management)         General transfer comment: patient reports feeling dizziness while seated on bed side commode. blood pressure 89/51. lifting assistance required for standing from bed side commode with cues for technique, safe hand placement, and use of rolling walker to pivot to the recliner chair. patient continues to report mild dizziness after transfer to the recliner with a second blood pressure taken at 79/50. nurse in the room and aware    Ambulation/Gait               General Gait Details: ambulation deferred for safety concerns due to low blood pressure and dizziness with upright activity   Stairs             Wheelchair Mobility    Modified Rankin (Stroke Patients Only)       Balance Overall balance assessment: Needs assistance Sitting-balance support: Feet supported, Bilateral upper extremity supported Sitting balance-Leahy Scale: Fair     Standing balance support: Reliant on assistive device for balance, Bilateral upper extremity supported Standing balance-Leahy Scale: Poor Standing balance comment: external support required for standing balance with use of rolling walker. limited by dizziness and fatigue with minimal activity                            Cognition Arousal/Alertness: Awake/alert Behavior During Therapy: WFL for tasks assessed/performed Overall Cognitive Status: Within Functional Limits for tasks assessed  General Comments: patient able to follow single step commands. she was mildly letahrgic but reports minimal sleep last night with pain control issues        Exercises Total Joint Exercises Ankle Circles/Pumps: AROM, Strengthening, Both, 10 reps,  Seated Other Exercises Other Exercises: encouraged ankle pumps while in the chair. further exercises deferred due to low blood pressure    General Comments General comments (skin integrity, edema, etc.): Vitals in sitting: BP 79/50 (58). Mild dizziness throughout evaluation, RN present at end of session to administer fluids.      Pertinent Vitals/Pain Pain Assessment Pain Assessment: Faces Faces Pain Scale: Hurts whole lot Pain Location: R hip with movement Pain Descriptors / Indicators: Discomfort, Grimacing Pain Intervention(s): Limited activity within patient's tolerance    Home Living Family/patient expects to be discharged to:: Private residence Living Arrangements: Alone Available Help at Discharge: Family;Available 24 hours/day (Pt has 2 daughters nearby, one daughter is planning to stay at pt's house upon D/C.) Type of Home: House Home Access: Stairs to enter Entrance Stairs-Rails:  (one rail in the middle) Technical brewer of Steps: 2   Home Layout: One level Home Equipment: Conservation officer, nature (2 wheels);BSC/3in1;Adaptive equipment      Prior Function            PT Goals (current goals can now be found in the care plan section) Acute Rehab PT Goals PT Goal Formulation: With patient Time For Goal Achievement: 07/24/22 Potential to Achieve Goals: Good Progress towards PT goals: Progressing toward goals    Frequency    BID      PT Plan Current plan remains appropriate    Co-evaluation PT/OT/SLP Co-Evaluation/Treatment: Yes (partial co-treament completed) Reason for Co-Treatment: Complexity of the patient's impairments (multi-system involvement) PT goals addressed during session: Mobility/safety with mobility        AM-PAC PT "6 Clicks" Mobility   Outcome Measure  Help needed turning from your back to your side while in a flat bed without using bedrails?: A Little Help needed moving from lying on your back to sitting on the side of a flat bed  without using bedrails?: A Little Help needed moving to and from a bed to a chair (including a wheelchair)?: A Little Help needed standing up from a chair using your arms (e.g., wheelchair or bedside chair)?: A Little Help needed to walk in hospital room?: A Little Help needed climbing 3-5 steps with a railing? : A Lot 6 Click Score: 17    End of Session Equipment Utilized During Treatment: Gait belt Activity Tolerance: Patient limited by fatigue (blood pressure) Patient left: in chair;with call bell/phone within reach;with family/visitor present;with nursing/sitter in room;with SCD's reapplied Nurse Communication: Mobility status (blood pressure) PT Visit Diagnosis: Unsteadiness on feet (R26.81);Muscle weakness (generalized) (M62.81)     Time: 0263-7858 PT Time Calculation (min) (ACUTE ONLY): 25 min  Charges:  $Therapeutic Activity: 23-37 mins                     Minna Merritts, PT, MPT    Percell Locus 07/11/2022, 11:36 AM

## 2022-07-11 NOTE — Plan of Care (Signed)
  Problem: Education: Goal: Knowledge of General Education information will improve Description: Including pain rating scale, medication(s)/side effects and non-pharmacologic comfort measures Outcome: Progressing   Problem: Health Behavior/Discharge Planning: Goal: Ability to manage health-related needs will improve Outcome: Progressing   Problem: Clinical Measurements: Goal: Ability to maintain clinical measurements within normal limits will improve Outcome: Progressing Goal: Will remain free from infection Outcome: Progressing Goal: Diagnostic test results will improve Outcome: Progressing   Problem: Activity: Goal: Risk for activity intolerance will decrease Outcome: Progressing   Problem: Nutrition: Goal: Adequate nutrition will be maintained Outcome: Progressing   Problem: Coping: Goal: Level of anxiety will decrease Outcome: Progressing   Problem: Pain Managment: Goal: General experience of comfort will improve Outcome: Progressing   Problem: Safety: Goal: Ability to remain free from injury will improve Outcome: Progressing   

## 2022-07-11 NOTE — Progress Notes (Signed)
Subjective: 1 Day Post-Op Procedure(s) (LRB): TOTAL HIP ARTHROPLASTY ANTERIOR APPROACH (Right) Patient reports pain as mild.   Patient is well, and has had no acute complaints or problems Denies any CP, SOB, ABD pain. We will continue therapy today.  Plan is to go Home after hospital stay.  Objective: Vital signs in last 24 hours: Temp:  [96.8 F (36 C)-99.5 F (37.5 C)] 98.2 F (36.8 C) (08/09 0541) Pulse Rate:  [49-96] 92 (08/09 0541) Resp:  [13-23] 18 (08/09 0541) BP: (92-135)/(50-91) 113/50 (08/09 0541) SpO2:  [93 %-100 %] 95 % (08/09 0748)  Intake/Output from previous day: 08/08 0701 - 08/09 0700 In: 1300 [I.V.:1100; IV Piggyback:200] Out: 1600 [Urine:1300; Blood:300] Intake/Output this shift: No intake/output data recorded.  Recent Labs    07/10/22 0810 07/11/22 0629  HGB 13.1 10.7*   Recent Labs    07/10/22 0810 07/11/22 0629  WBC 6.0 7.8  RBC 4.53 3.69*  HCT 40.1 32.4*  PLT 176 165   Recent Labs    07/10/22 1407 07/11/22 0629  NA  --  130*  K  --  4.2  CL  --  104  CO2  --  24  BUN  --  13  CREATININE 0.93 0.87  GLUCOSE  --  148*  CALCIUM  --  8.2*   No results for input(s): "LABPT", "INR" in the last 72 hours.  EXAM General - Patient is Alert, Appropriate, and Oriented Extremity - Neurovascular intact Sensation intact distally Intact pulses distally Dorsiflexion/Plantar flexion intact No cellulitis present Compartment soft Dressing - dressing C/D/I and no drainage, provena intact with out drainage Motor Function - intact, moving foot and toes well on exam.   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 Mild TR; b.) TTE 07/16/2019: EF 60-65%, triv AR, mild MR/TR, G1DD.   Diet-controlled type 2 diabetes mellitus ([place])    Diverticulosis of colon (without mention of hemorrhage)    DOE (dyspnea on exertion)    Dyspnea    Fatigue    Fecal incontinence    GERD (gastroesophageal reflux disease)    Glaucoma    Goiter    right side   Heart murmur    History of stress test    a. 01/2014 Lexi MV: no ischemia/infarct.   Hyperkalemia    Hyperlipidemia    Hypertension    a. 11/2018 Renal artery duplex: No RAS.    Hypothyroidism    IBS (irritable bowel syndrome)    Migraine    Mitral valve prolapse    a. 01/2014 Echo: no MR/MVP noted.   Neck mass    right side/ no airway problems/ per pt has had since 2005 (goiter)   Nonspecific abnormal electrocardiogram (ECG) (EKG)    Palpitations    a. 01/2014 CardioNet: wore for 9 days (rash). Sinus tach - 100's. No SVT/AF.   Pneumonia    Polyp, sigmoid colon    Post-operative nausea and vomiting    Pulmonary fibrosis (HCC)    Pulmonary nodules    Recurrent UTI  Tachycardia    Tremor     Assessment/Plan:   1 Day Post-Op Procedure(s) (LRB): TOTAL HIP ARTHROPLASTY ANTERIOR APPROACH (Right) Principal Problem:   Status post total hip replacement, right  Estimated body mass index is 22.63 kg/m as calculated from the following:   Height as of 06/29/22: '5\' 8"'$  (1.727 m).   Weight as of 06/29/22: 67.5 kg. Advance diet Up with therapy Pain controlled BP soft - hold BP meds today, 1 Bolus IV fluids this am Labs stable CM to assist with discharge to home with HHPT pending completion of PT goals   DVT Prophylaxis - Lovenox, TED hose, and SCDs Weight-Bearing as tolerated to right leg   T. Rachelle Hora, PA-C [place] 07/11/2022, 8:19 AM

## 2022-07-11 NOTE — Progress Notes (Signed)
The patient lives alone but has family to help Her daughters will provide transportation The patient is having some low Blood pressure and feels that she would qualify as Inpatient I explained that the Observation is a medicare guideline but I would send to utilization to review to see if it can be changed if it meets criteria for inpatient She has a rolling walker and a 3 in 1 at home, enhabit is set up for hh services

## 2022-07-11 NOTE — Progress Notes (Signed)
At bedside report, patient refused to get out of bed w/ nursing staff stating she feels more comfortable getting up w/ PT despite encouragement from staff.

## 2022-07-11 NOTE — Evaluation (Signed)
Occupational Therapy Evaluation Patient Details Name: Christy Richardson MRN: [id number] DOB: [birth year] Today's Date: 07/11/2022   History of Present Illness Patient is a 87 year old female with arthritis of right hip s/p right total hip arthroplasty anterior approach. History of bronchiectasis, dyspnea on exertion, IBS, previous left THA.   Clinical Impression   Patient seen for OT evaluation this date. Pt presenting with R hip pain, decreased strength, and decreased endurance impacting safety and independence in ADLs. At baseline, pt was independent with ADLs, IADLs, and functional mobility without an AD. Pt's performance was limited during evaluation 2/2 R hip pain and orthostatics (low BP). Pt is currently requiring physical assistance to complete bed mobility, functional transfers, and toileting. Pt will benefit from acute skilled OT services while in hospital to increase overall independence in the areas of ADLs and functional mobility. At this time OT recommends STR at discharge, however, could improve to home health OT pending progress while in this hospital and caregiver support at home.       Recommendations for follow up therapy are one component of a multi-disciplinary discharge planning process, led by the attending physician.  Recommendations may be updated based on patient status, additional functional criteria and insurance authorization.   Follow Up Recommendations  Skilled nursing-short term rehab (<3 hours/day) (could improve to [facility])    Assistance Recommended at Discharge Intermittent Supervision/Assistance  Patient can return home with the following A lot of help with bathing/dressing/bathroom;A lot of help with walking and/or transfers;Assistance with cooking/housework;Assist for transportation    Functional Status Assessment  Patient has had a recent decline in their functional status and demonstrates the ability to make significant improvements in function in a reasonable and  predictable amount of time.  Equipment Recommendations  Other (comment) (defer to next venue of care)           Precautions / Restrictions Precautions Precautions: Fall Restrictions Weight Bearing Restrictions: No      Mobility Bed Mobility Overal bed mobility: Needs Assistance Bed Mobility: Supine to Sit     Supine to sit: Max assist, HOB elevated     General bed mobility comments: VC for sequencing. Max A to manage RLE 2/2 pain, required UE support using bed rail and therapist hand. Max A to scoot hips forward at EOB Patient Response: Cooperative, Anxious  Transfers Overall transfer level: Needs assistance Equipment used: Rolling walker (2 wheels) Transfers: Sit to/from Stand, Bed to chair/wheelchair/BSC Sit to Stand: Mod assist Stand pivot transfers: +2 safety/equipment, Min assist         General transfer comment: STS from EOB with Mod A + RW. STS from [facility] with Min A + RW, lateral steps to recliner with Min guard. Pt required Min A for RW management 2/2 tight transfer space.      Balance Overall balance assessment: Needs assistance Sitting-balance support: Feet supported, Bilateral upper extremity supported Sitting balance-Leahy Scale: Poor Sitting balance - Comments: Min A to maintain sitting balance at EOB Postural control: Posterior lean Standing balance support: Reliant on assistive device for balance, Bilateral upper extremity supported Standing balance-Leahy Scale: Poor                             ADL either performed or assessed with clinical judgement   ADL Overall ADL's : Needs assistance/impaired  Toilet Transfer: Minimal assistance;Rolling walker (2 wheels);BSC/3in1 Toilet Transfer Details (indicate cue type and reason): VC for hand placement                 Vision Baseline Vision/History: 1 Wears glasses Patient Visual Report: No change from baseline                  Pertinent  Vitals/Pain Pain Assessment Pain Assessment: 0-10 Pain Score: 8  Pain Location: R hip Pain Descriptors / Indicators: Discomfort, Grimacing Pain Intervention(s): Limited activity within patient's tolerance, Monitored during session, Repositioned (Pt reports unable to receive meds 2/2 low BP)     Hand Dominance     Extremity/Trunk Assessment Upper Extremity Assessment Upper Extremity Assessment: Generalized weakness   Lower Extremity Assessment Lower Extremity Assessment: RLE deficits/detail;Generalized weakness;LLE deficits/detail RLE Sensation: decreased light touch (impaired but improving since surgery) LLE Sensation: decreased light touch (impaired but improving since surgery)       Communication Communication Communication: No difficulties   Cognition Arousal/Alertness: Awake/alert Behavior During Therapy: WFL for tasks assessed/performed Overall Cognitive Status: Within Functional Limits for tasks assessed                                       General Comments  Vitals in sitting: BP 79/50 (58). Mild dizziness throughout evaluation, RN present at end of session to administer fluids.               Home Living Family/patient expects to be discharged to:: Private residence Living Arrangements: Alone Available Help at Discharge: Family;Available 24 hours/day (Pt has 2 daughters nearby, one daughter is planning to stay at pt's house upon D/C.) Type of Home: House Home Access: Stairs to enter [employer] of Steps: 2 Entrance Stairs-Rails:  (one rail in the middle) Home Layout: One level     Bathroom Shower/Tub: Teacher, early years/pre: Handicapped height     Home Equipment: Conservation officer, nature (2 wheels);BSC/3in1;Adaptive equipment Adaptive Equipment: Reacher        Prior Functioning/Environment Prior Level of Function : Independent/Modified Independent             Mobility Comments: independent but with antalgic  gait ADLs Comments: Pt reports being independent with ADLs/IADLs. Pt normally stands to shower, does grocery shopping, still drives. 2 falls in past 6 months.        OT Problem List: Decreased strength;Impaired vision/perception;Decreased knowledge of use of DME or AE;Decreased activity tolerance;Impaired balance (sitting and/or standing);Pain;Impaired sensation      OT Treatment/Interventions: Self-care/ADL training;Therapeutic exercise;Patient/family education;Balance training;Energy conservation;Therapeutic activities;DME and/or AE instruction    OT Goals(Current goals can be found in the care plan section) Acute Rehab OT Goals Patient Stated Goal: get stronger, reduce pain OT Goal Formulation: With patient/family Time For Goal Achievement: 07/25/22 Potential to Achieve Goals: Good ADL Goals Pt Will Perform Grooming: Independently;standing Pt Will Perform Upper Body Dressing: Independently Pt Will Perform Lower Body Dressing: sitting/lateral leans;with adaptive equipment;Independently Pt Will Transfer to Toilet: Independently;regular height toilet;grab bars Pt Will Perform Toileting - Clothing Manipulation and hygiene: sit to/from stand;Independently  OT Frequency: Min 2X/week                  AM-PAC OT "6 Clicks" Daily Activity     Outcome Measure Help from another person eating meals?: None Help from another person taking care of personal grooming?: A Little Help from another person  toileting, which includes using toliet, bedpan, or urinal?: A Lot Help from another person bathing (including washing, rinsing, drying)?: A Lot Help from another person to put on and taking off regular upper body clothing?: A Little Help from another person to put on and taking off regular lower body clothing?: A Lot 6 Click Score: 16   End of Session Equipment Utilized During Treatment: Gait belt;Rolling walker (2 wheels) Nurse Communication: Mobility status  Activity Tolerance: Patient  limited by pain Patient left: in chair;with call bell/phone within reach;with family/visitor present;with nursing/sitter in room (handoff to PT)  OT Visit Diagnosis: Other abnormalities of gait and mobility (R26.89);History of falling (Z91.81);Pain;Muscle weakness (generalized) (M62.81) Pain - Right/Left: Right Pain - part of body: Hip                Time: 0355-9741 OT Time Calculation (min): 31 min Charges:  OT General Charges $OT Visit: 1 Visit OT Evaluation $OT Eval Low Complexity: 1 Low  [employer] MS, OTR/L ascom [phone]  07/11/22, 10:24 AM

## 2022-07-11 NOTE — Plan of Care (Signed)

## 2022-07-12 DIAGNOSIS — I341 Nonrheumatic mitral (valve) prolapse: Secondary | ICD-10-CM | POA: Diagnosis present

## 2022-07-12 DIAGNOSIS — K219 Gastro-esophageal reflux disease without esophagitis: Secondary | ICD-10-CM | POA: Diagnosis present

## 2022-07-12 DIAGNOSIS — Z888 Allergy status to other drugs, medicaments and biological substances status: Secondary | ICD-10-CM | POA: Diagnosis not present

## 2022-07-12 DIAGNOSIS — F419 Anxiety disorder, unspecified: Secondary | ICD-10-CM | POA: Diagnosis present

## 2022-07-12 DIAGNOSIS — Z90710 Acquired absence of both cervix and uterus: Secondary | ICD-10-CM | POA: Diagnosis not present

## 2022-07-12 DIAGNOSIS — E119 Type 2 diabetes mellitus without complications: Secondary | ICD-10-CM | POA: Diagnosis present

## 2022-07-12 DIAGNOSIS — E039 Hypothyroidism, unspecified: Secondary | ICD-10-CM | POA: Diagnosis present

## 2022-07-12 DIAGNOSIS — Z86010 Personal history of colonic polyps: Secondary | ICD-10-CM | POA: Diagnosis not present

## 2022-07-12 DIAGNOSIS — Z974 Presence of external hearing-aid: Secondary | ICD-10-CM | POA: Diagnosis not present

## 2022-07-12 DIAGNOSIS — Z8249 Family history of ischemic heart disease and other diseases of the circulatory system: Secondary | ICD-10-CM | POA: Diagnosis not present

## 2022-07-12 DIAGNOSIS — J479 Bronchiectasis, uncomplicated: Secondary | ICD-10-CM | POA: Diagnosis present

## 2022-07-12 DIAGNOSIS — Z880 Allergy status to penicillin: Secondary | ICD-10-CM | POA: Diagnosis not present

## 2022-07-12 DIAGNOSIS — Z79899 Other long term (current) drug therapy: Secondary | ICD-10-CM | POA: Diagnosis not present

## 2022-07-12 DIAGNOSIS — Z801 Family history of malignant neoplasm of trachea, bronchus and lung: Secondary | ICD-10-CM | POA: Diagnosis not present

## 2022-07-12 DIAGNOSIS — E785 Hyperlipidemia, unspecified: Secondary | ICD-10-CM | POA: Diagnosis present

## 2022-07-12 DIAGNOSIS — Z87440 Personal history of urinary (tract) infections: Secondary | ICD-10-CM | POA: Diagnosis not present

## 2022-07-12 DIAGNOSIS — I10 Essential (primary) hypertension: Secondary | ICD-10-CM | POA: Diagnosis present

## 2022-07-12 DIAGNOSIS — Z79890 Hormone replacement therapy (postmenopausal): Secondary | ICD-10-CM | POA: Diagnosis not present

## 2022-07-12 DIAGNOSIS — Z833 Family history of diabetes mellitus: Secondary | ICD-10-CM | POA: Diagnosis not present

## 2022-07-12 DIAGNOSIS — H9191 Unspecified hearing loss, right ear: Secondary | ICD-10-CM | POA: Diagnosis present

## 2022-07-12 DIAGNOSIS — M1611 Unilateral primary osteoarthritis, right hip: Secondary | ICD-10-CM | POA: Diagnosis present

## 2022-07-12 LAB — CBC
HCT: 29.5 % — ABNORMAL LOW (ref 36.0–46.0)
Hemoglobin: 9.9 g/dL — ABNORMAL LOW (ref 12.0–15.0)
MCH: 29.5 pg (ref 26.0–34.0)
MCHC: 33.6 g/dL (ref 30.0–36.0)
MCV: 87.8 fL (ref 80.0–100.0)
Platelets: 152 10*3/uL (ref 150–400)
RBC: 3.36 MIL/uL — ABNORMAL LOW (ref 3.87–5.11)
RDW: 12.9 % (ref 11.5–15.5)
WBC: 7.9 10*3/uL (ref 4.0–10.5)
nRBC: 0 % (ref 0.0–0.2)

## 2022-07-12 LAB — SURGICAL PATHOLOGY

## 2022-07-12 MED ORDER — ENOXAPARIN SODIUM 40 MG/0.4ML IJ SOSY: 40.0000 mg | PREFILLED_SYRINGE | INTRAMUSCULAR | 0 refills | Status: DC

## 2022-07-12 MED ORDER — AMLODIPINE BESYLATE 5 MG PO TABS: 5.0000 mg | ORAL_TABLET | Freq: Every evening | ORAL | Status: DC

## 2022-07-12 MED ORDER — DOCUSATE SODIUM 100 MG PO CAPS: 100.0000 mg | ORAL_CAPSULE | Freq: Two times a day (BID) | ORAL | 0 refills | Status: DC

## 2022-07-12 MED ORDER — HYDROCODONE-ACETAMINOPHEN 5-325 MG PO TABS: 1.0000 | ORAL_TABLET | ORAL | 0 refills | Status: DC | PRN

## 2022-07-12 MED ORDER — SODIUM CHLORIDE 0.9 % IV BOLUS
500.0000 mL | Freq: Once | INTRAVENOUS | Status: AC
  Administered 2022-07-12: 500 mL via INTRAVENOUS

## 2022-07-12 MED ORDER — TRAMADOL HCL 50 MG PO TABS: 50.0000 mg | ORAL_TABLET | Freq: Four times a day (QID) | ORAL | 0 refills | Status: DC | PRN

## 2022-07-12 MED ORDER — LOSARTAN POTASSIUM 50 MG PO TABS: 100.0000 mg | ORAL_TABLET | Freq: Every evening | ORAL | Status: DC

## 2022-07-12 NOTE — Plan of Care (Signed)

## 2022-07-12 NOTE — Progress Notes (Signed)
Physical Therapy Treatment Patient Details Name: Christy Richardson MRN: [id number] DOB: December 26, 1934 Today's Date: 07/12/2022   History of Present Illness Patient is a 87 year old female with arthritis of right hip s/p right total hip arthroplasty anterior approach. History of bronchiectasis, dyspnea on exertion, IBS, previous left THA.    PT Comments    Patient reports moderate pain in the R hip today. Activity tolerance has improved today compared to yesterday. Orthostatic vitals taken with mild dizziness with upright activity. Patient progressed to hallway ambulation today, 39f with rolling walker and chair follow for safety. Limited activity tolerance in standing for progression of further ambulation. She continues to require physical assistance with all activity. The patient and family feel they will be unable to manage at home given current functional status and are requesting SNF at this time. I agree that SNF would be the most appropriate discharge recommendation at this time. PT will continue to follow.   Orthostatic vitals during PT session:  BP- Lying Pulse- Lying BP- Sitting Pulse- Sitting BP- Standing at 0 minutes Pulse- Standing at 0 minutes  07/12/22 1000 97/43 92 101/47 97 109/63 104     Recommendations for follow up therapy are one component of a multi-disciplinary discharge planning process, led by the attending physician.  Recommendations may be updated based on patient status, additional functional criteria and insurance authorization.  Follow Up Recommendations  Skilled nursing-short term rehab (<3 hours/day) Can patient physically be transported by private vehicle: Yes   Assistance Recommended at Discharge Intermittent Supervision/Assistance  Patient can return home with the following A little help with walking and/or transfers;A little help with bathing/dressing/bathroom;Assist for transportation;Help with stairs or ramp for entrance;Assistance with cooking/housework    Equipment Recommendations  None recommended by PT    Recommendations for Other Services       Precautions / Restrictions Precautions Precautions: Fall Restrictions Weight Bearing Restrictions: No RLE Weight Bearing: Weight bearing as tolerated     Mobility  Bed Mobility Overal bed mobility: Needs Assistance Bed Mobility: Supine to Sit     Supine to sit: Mod assist     General bed mobility comments: physical assistance required for RLE and trunk support. increased time and effort required. cues for technique and sequencing    Transfers Overall transfer level: Needs assistance Equipment used: Rolling walker (2 wheels) Transfers: Sit to/from Stand Sit to Stand: Mod assist           General transfer comment: verbal cues for hand placement for safety. physical assistance of lifting and lowering is required for transfers. 2 standing bouts performed    Ambulation/Gait Ambulation/Gait assistance: Mod assist, Min assist (chair follow for safety) Gait Distance (Feet): 45 Feet Assistive device: Rolling walker (2 wheels) Gait Pattern/deviations: Decreased stance time - right, Decreased stride length Gait velocity: decreased     General Gait Details: patient required weight shifting assistance for advancement of RLE. Mod A initially progressing to Min A with increased ambulation distance. chair follow for safety. patient reports mild dizziness with standing  with limited overall activity tolerance   Stairs             Wheelchair Mobility    Modified Rankin (Stroke Patients Only)       Balance   Sitting-balance support: Feet supported Sitting balance-Leahy Scale: Fair     Standing balance support: Reliant on assistive device for balance, Bilateral upper extremity supported Standing balance-Leahy Scale: Poor Standing balance comment: external support required  Cognition Arousal/Alertness: Awake/alert Behavior During  Therapy: WFL for tasks assessed/performed Overall Cognitive Status: Within Functional Limits for tasks assessed                                 General Comments: much less groggy today. patient able to folow all commands with increased time        Exercises      General Comments General comments (skin integrity, edema, etc.): orthostatic vitals taken during session.      Pertinent Vitals/Pain Pain Assessment Pain Assessment: Faces Faces Pain Scale: Hurts even more Pain Location: R hip with movement Pain Descriptors / Indicators: Discomfort, Grimacing Pain Intervention(s): Limited activity within patient's tolerance, Premedicated before session, Repositioned, Ice applied    Home Living                          Prior Function            PT Goals (current goals can now be found in the care plan section) Acute Rehab PT Goals Patient Stated Goal: to walk more PT Goal Formulation: With patient/family Time For Goal Achievement: 07/24/22 Potential to Achieve Goals: Good Progress towards PT goals: Progressing toward goals    Frequency    BID      PT Plan Discharge plan needs to be updated    Co-evaluation              AM-PAC PT "6 Clicks" Mobility   Outcome Measure  Help needed turning from your back to your side while in a flat bed without using bedrails?: A Little Help needed moving from lying on your back to sitting on the side of a flat bed without using bedrails?: A Lot Help needed moving to and from a bed to a chair (including a wheelchair)?: A Lot Help needed standing up from a chair using your arms (e.g., wheelchair or bedside chair)?: A Lot Help needed to walk in hospital room?: A Lot Help needed climbing 3-5 steps with a railing? : Total 6 Click Score: 12    End of Session Equipment Utilized During Treatment: Gait belt Activity Tolerance: Patient tolerated treatment well Patient left: in chair;with call bell/phone within  reach;with SCD's reapplied;with family/visitor present (ice pack applied R hip) Nurse Communication: Mobility status PT Visit Diagnosis: Unsteadiness on feet (R26.81);Muscle weakness (generalized) (M62.81)     Time: 7494-4967 PT Time Calculation (min) (ACUTE ONLY): 41 min  Charges:  $Gait Training: 23-37 mins $Therapeutic Activity: 8-22 mins                     Minna Merritts, PT, MPT    Percell Locus 07/12/2022, 11:33 AM

## 2022-07-12 NOTE — TOC Progression Note (Signed)
Transition of Care [facility]) - Progression Note    Patient Details  Name: Christy Richardson MRN: [id number] Date of Birth: 27-Oct-1935  Transition of Care [facility]) CM/SW [place], RN Phone Number: 07/12/2022, 2:08 PM  Clinical Narrative:   Called the [facility] and left a secure VM, Per the VoiceMail noone is available to accept the referral at this time, [facility] bed offer, I let the patient know and she said that she does not want to go outside of [facility], I explained that the only other option is [facility] and she refuses that as well, I explained that her hospital stay is as Observation status and she stated that she knows that    Expected Discharge Plan: [place] Barriers to Discharge: Barriers Resolved  Expected Discharge Plan and Services Expected Discharge Plan: [place]   Discharge Planning Services: CM Consult   Living arrangements for the past 2 months: Single Family Home                 DME Arranged: N/A DME Agency: NA       HH Arranged: PT HH Agency: [place] Date [facility] Agency Contacted: 07/11/22 Time HH Agency Contacted: [phone] Representative spoke with at [place]: [place] (SDOH) Interventions    Readmission Risk Interventions     No data to display

## 2022-07-12 NOTE — NC FL2 (Signed)
[place] LEVEL OF CARE SCREENING TOOL     IDENTIFICATION  Patient Name: Christy Richardson Birthdate: 08/07/1935 Sex: female Admission Date (Current Location): 07/10/2022  [facility] and [place] Number:  Engineering geologist and Address:  [facility], [address], [place], [place] [address]      Provider Number: [id number]  Attending Physician Name and Address:  Hessie Knows, MD  Relative Name and Phone Number:  Joelene Millin, daughter [phone]    Current Level of Care: Hospital Recommended Level of Care: [place] Prior Approval Number:    Date Approved/Denied:   PASRR Number: [id number]  Discharge Plan: SNF    Current Diagnoses: Patient Active Problem List   Diagnosis Date Noted   S/P total right hip arthroplasty 07/11/2022   Status post total hip replacement, right 07/10/2022   Preop respiratory exam 06/21/2022   Sinus tachycardia 02/04/2018   Hyperlipidemia LDL goal <70 08/16/2015   Left ventricular diastolic dysfunction, NYHA class 1 03/13/2014   Chest pain 01/30/2014   Tremor, essential 05/07/2013   Fecal incontinence 09/30/2012   Pulmonary nodules 05/15/2012   Dyspnea 05/27/2011   Bronchiectasis without acute exacerbation ([place]) 09/26/2009   OSTEOPENIA 03/17/2009   COLONIC POLYPS, ADENOMATOUS, HX OF 09/27/2008   BACK PAIN 09/01/2008   LIPOMA 06/01/2008   UTI'S, RECURRENT 06/10/2007   Hypothyroidism 01/07/2007   Hyperlipidemia 01/07/2007   Anxiety 01/07/2007   DEPRESSION 01/07/2007   MIGRAINE HEADACHE 01/07/2007   GLAUCOMA NEC 01/07/2007   DECREASED HEARING 01/07/2007   Essential hypertension 01/07/2007   Allergic rhinitis 01/07/2007   GERD 01/07/2007   IBS 01/07/2007   OSTEOARTHRITIS 01/07/2007    Orientation RESPIRATION BLADDER Height & Weight     Self, Time, Situation, Place  Normal Continent, External catheter Weight: 67.5 kg Height:  '5\' 8"'$  (172.7 cm)  BEHAVIORAL SYMPTOMS/MOOD NEUROLOGICAL  BOWEL NUTRITION STATUS      Continent Diet (see dc summary)  AMBULATORY STATUS COMMUNICATION OF NEEDS Skin   Extensive Assist Verbally Normal, Surgical wounds                       Personal Care Assistance Level of Assistance  Bathing, Feeding, Dressing Bathing Assistance: Limited assistance Feeding assistance: Independent Dressing Assistance: Limited assistance     Functional Limitations Info             SPECIAL CARE FACTORS FREQUENCY  PT (By licensed PT), OT (By licensed OT)     PT Frequency: 5 times per week OT Frequency: 5 times per week            Contractures Contractures Info: Not present    Additional Factors Info  Code Status, Allergies Code Status Info: Full code Allergies Info: Prednisone, Tramadol, Adhesive (Tape), Penicillins, Sulfonamide Derivatives           Current Medications (07/12/2022):  This is the current hospital active medication list Current Facility-Administered Medications  Medication Dose Route Frequency Provider Last Rate Last Admin   0.9 %  sodium chloride infusion   Intravenous Continuous Hessie Knows, MD 100 mL/hr at 07/11/22 2227 New Bag at 07/11/22 2227   acetaminophen (TYLENOL) tablet 325-650 mg  325-650 mg Oral Q6H PRN Hessie Knows, MD   650 mg at 07/11/22 1531   albuterol (PROVENTIL) (2.5 MG/3ML) 0.083% nebulizer solution 2.5 mg  2.5 mg Nebulization Q6H PRN Hessie Knows, MD       alum & mag hydroxide-simeth (MAALOX/MYLANTA) 200-200-20 MG/5ML suspension 30 mL  30 mL  Oral Q4H PRN Hessie Knows, MD       amLODipine (NORVASC) tablet 5 mg  5 mg Oral QPM Duanne Guess, PA-C       arformoterol (BROVANA) nebulizer solution 15 mcg  15 mcg Nebulization BID Hessie Knows, MD   15 mcg at 07/12/22 0736   And   umeclidinium bromide (INCRUSE ELLIPTA) 62.5 MCG/ACT 1 puff  1 puff Inhalation Daily Hessie Knows, MD   1 puff at 07/12/22 0844   bisacodyl (DULCOLAX) EC tablet 5 mg  5 mg Oral Daily PRN Hessie Knows, MD   5 mg at 07/11/22  0546   brimonidine (ALPHAGAN) 0.2 % ophthalmic solution 1 drop  1 drop Both Eyes BID Hessie Knows, MD   1 drop at 07/12/22 0847   carvedilol (COREG) tablet 6.25 mg  6.25 mg Oral BID WC Hessie Knows, MD       cyclobenzaprine (FLEXERIL) tablet 10 mg  10 mg Oral TID PRN Hessie Knows, MD   10 mg at 07/10/22 1711   diphenhydrAMINE (BENADRYL) 12.5 MG/5ML elixir 12.5-25 mg  12.5-25 mg Oral Q4H PRN Hessie Knows, MD       docusate sodium (COLACE) capsule 100 mg  100 mg Oral BID Hessie Knows, MD   100 mg at 07/12/22 0843   dorzolamide (TRUSOPT) 2 % ophthalmic solution 1 drop  1 drop Both Eyes BID Hessie Knows, MD   1 drop at 07/12/22 0847   enoxaparin (LOVENOX) injection 40 mg  40 mg Subcutaneous Q24H Hessie Knows, MD   40 mg at 07/12/22 0841   ferrous [id number] C-folic acid (TRINSICON / FOLTRIN) capsule 1 capsule  1 capsule Oral BID PC Hessie Knows, MD   1 capsule at 07/12/22 0844   guaiFENesin (MUCINEX) 12 hr tablet 600 mg  600 mg Oral Q12H Hessie Knows, MD   600 mg at 07/12/22 2440   hydrALAZINE (APRESOLINE) tablet 50 mg  50 mg Oral TID PRN Hessie Knows, MD       HYDROcodone-acetaminophen (NORCO) 7.5-325 MG per tablet 1-2 tablet  1-2 tablet Oral Q4H PRN Hessie Knows, MD       HYDROcodone-acetaminophen (NORCO/VICODIN) 5-325 MG per tablet 1-2 tablet  1-2 tablet Oral Q4H PRN Hessie Knows, MD   1 tablet at 07/12/22 1027   levothyroxine (SYNTHROID) tablet 75 mcg  75 mcg Oral QAC breakfast Hessie Knows, MD   75 mcg at 07/12/22 0557   loratadine (CLARITIN) tablet 10 mg  10 mg Oral Daily Hessie Knows, MD   10 mg at 07/12/22 2536   LORazepam (ATIVAN) tablet 0.5 mg  0.5 mg Oral BID PRN Hessie Knows, MD   0.5 mg at 07/12/22 6440   losartan (COZAAR) tablet 100 mg  100 mg Oral QPM Duanne Guess, PA-C       menthol-cetylpyridinium (CEPACOL) lozenge 3 mg  1 lozenge Oral PRN Hessie Knows, MD       Or   phenol (CHLORASEPTIC) mouth spray 1 spray  1 spray Mouth/Throat PRN Hessie Knows, MD        metoCLOPramide (REGLAN) tablet 5-10 mg  5-10 mg Oral Q8H PRN Hessie Knows, MD       Or   metoCLOPramide (REGLAN) injection 5-10 mg  5-10 mg Intravenous Q8H PRN Hessie Knows, MD       morphine (PF) 2 MG/ML injection 0.5-1 mg  0.5-1 mg Intravenous Q2H PRN Hessie Knows, MD   1 mg at 07/10/22 1811   multivitamin with minerals tablet 1 tablet  1 tablet Oral  Once per day on Mon Wed Fri Menz, Michael, MD   1 tablet at 07/11/22 0940   ondansetron (ZOFRAN) tablet 4 mg  4 mg Oral Q6H PRN Hessie Knows, MD       Or   ondansetron [facility]) injection 4 mg  4 mg Intravenous Q6H PRN Hessie Knows, MD       pantoprazole (PROTONIX) EC tablet 40 mg  40 mg Oral QAC breakfast Hessie Knows, MD   40 mg at 07/12/22 0844   polyvinyl alcohol (LIQUIFILM TEARS) 1.4 % ophthalmic solution 1 drop  1 drop Both Eyes PRN Hessie Knows, MD       pravastatin (PRAVACHOL) tablet 40 mg  40 mg Oral QHS Hessie Knows, MD   40 mg at 07/11/22 2125   senna-docusate (Senokot-S) tablet 1 tablet  1 tablet Oral QHS PRN Hessie Knows, MD       sertraline (ZOLOFT) tablet 200 mg  200 mg Oral Daily Hessie Knows, MD   200 mg at 07/12/22 0841   sodium chloride (OCEAN) 0.65 % nasal spray 2 spray  2 spray Each Nare Daily PRN Hessie Knows, MD       sodium phosphate (FLEET) 7-19 GM/118ML enema 1 enema  1 enema Rectal Once PRN Hessie Knows, MD       traMADol Veatrice Bourbon) tablet 50 mg  50 mg Oral Q6H Hessie Knows, MD   50 mg at 07/12/22 0841   traZODone (DESYREL) tablet 50 mg  50 mg Oral QHS Hessie Knows, MD   50 mg at 07/11/22 2125   triamcinolone (NASACORT) nasal inhaler 1 spray  1 spray Nasal Daily Hessie Knows, MD       zolpidem Lorrin Mais) tablet 5 mg  5 mg Oral QHS PRN Hessie Knows, MD         Discharge Medications: Please see discharge summary for a list of discharge medications.  Relevant Imaging Results:  Relevant Lab Results:   Additional Information SS# [id number]  Conception Oms, RN

## 2022-07-12 NOTE — TOC Progression Note (Signed)
Transition of Care [facility]) - Progression Note    Patient Details  Name: Christy Richardson MRN: [id number] Date of Birth: 31-Oct-1935  Transition [facility] CM/SW [place], RN Phone Number: 07/12/2022, 1:36 PM  Clinical Narrative:    Met with the patient and her daughter in the room at the bedside to dicsuss DC plan and options, I reiterated the Observation status and MOON to her, they both stated understanding She wants to go to the [facility] in [place] but is agreeable to other bed options but want to stay as close to [place] as possible, She mention she may be agreeable to going to [facility] as well but wants to speak to her other daughter about it, Estate manager/land agent sent, Fl2 completed, PASSR obtained   Expected Discharge Plan: [place] Barriers to Discharge: Barriers Resolved  Expected Discharge Plan and Services Expected Discharge Plan: [place]   Discharge Planning Services: CM Consult   Living arrangements for the past 2 months: Single Family Home                 DME Arranged: N/A DME Agency: NA       HH Arranged: PT HH Agency: [place] Date [facility] Agency Contacted: 07/11/22 Time HH Agency Contacted: [phone] Representative spoke with at [place]: meg   Social Determinants of Health (SDOH) Interventions    Readmission Risk Interventions     No data to display

## 2022-07-12 NOTE — Discharge Summary (Signed)
Physician Discharge Summary  Patient ID: Christy Richardson MRN: [id number] DOB/AGE: May 21, 1935 87 y.o.  Admit date: 07/10/2022 Discharge date: 07/13/2022  Admission Diagnoses:  Status post total hip replacement, right [Z96.641] S/P total right hip arthroplasty [Z96.641]   Discharge Diagnoses: Patient Active Problem List   Diagnosis Date Noted   S/P total right hip arthroplasty 07/11/2022   Status post total hip replacement, right 07/10/2022   Preop respiratory exam 06/21/2022   Sinus tachycardia 02/04/2018   Hyperlipidemia LDL goal <70 08/16/2015   Left ventricular diastolic dysfunction, NYHA class 1 03/13/2014   Chest pain 01/30/2014   Tremor, essential 05/07/2013   Fecal incontinence 09/30/2012   Pulmonary nodules 05/15/2012   Dyspnea 05/27/2011   Bronchiectasis without acute exacerbation ([place]) 09/26/2009   OSTEOPENIA 03/17/2009   COLONIC POLYPS, ADENOMATOUS, HX OF 09/27/2008   BACK PAIN 09/01/2008   LIPOMA 06/01/2008   UTI'S, RECURRENT 06/10/2007   Hypothyroidism 01/07/2007   Hyperlipidemia 01/07/2007   Anxiety 01/07/2007   DEPRESSION 01/07/2007   MIGRAINE HEADACHE 01/07/2007   GLAUCOMA NEC 01/07/2007   DECREASED HEARING 01/07/2007   Essential hypertension 01/07/2007   Allergic rhinitis 01/07/2007   GERD 01/07/2007   IBS 01/07/2007   OSTEOARTHRITIS 01/07/2007    Past Medical History:  Diagnosis Date   Adenomatous polyp of colon 03/2007   Allergic rhinitis    Anxiety    a.) on BZO (lorazepam) PRN   Arthritis    Blood transfusion 1974   Bronchiectasis ([place])    Cancer ([place])    left arm   Deaf    in right 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 Mild TR; b.) TTE 07/16/2019: EF 60-65%, triv AR, mild MR/TR, G1DD.   Diet-controlled type 2 diabetes mellitus ([place])    Diverticulosis of colon (without mention of hemorrhage)    DOE (dyspnea on exertion)    Dyspnea     Fatigue    Fecal incontinence    GERD (gastroesophageal reflux disease)    Glaucoma    Goiter    right side   Heart murmur    History of stress test    a. 01/2014 Lexi MV: no ischemia/infarct.   Hyperkalemia    Hyperlipidemia    Hypertension    a. 11/2018 Renal artery duplex: No RAS.    Hypothyroidism    IBS (irritable bowel syndrome)    Migraine    Mitral valve prolapse    a. 01/2014 Echo: no MR/MVP noted.   Neck mass    right side/ no airway problems/ per pt has had since 2005 (goiter)   Nonspecific abnormal electrocardiogram (ECG) (EKG)    Palpitations    a. 01/2014 CardioNet: wore for 9 days (rash). Sinus tach - 100's. No SVT/AF.   Pneumonia    Polyp, sigmoid colon    Post-operative nausea and vomiting    Pulmonary fibrosis (HCC)    Pulmonary nodules    Recurrent UTI    Tachycardia    Tremor      Transfusion: none   Consultants (if any):   Discharged Condition: Improved  Hospital Course: KAULA KLENKE is an 87 y.o. female who was admitted 07/10/2022 with a diagnosis of Status post total hip replacement, right and went to the operating room on 07/10/2022 and underwent the above named procedures.    Surgeries: Procedure(s): TOTAL HIP ARTHROPLASTY ANTERIOR APPROACH on 07/10/2022 Patient tolerated the surgery  well. Taken to PACU where she was stabilized and then transferred to the orthopedic floor.  Started on Lovenox 40 mg q 24 hrs. Foot pumps applied bilaterally at 80 mm. Heels elevated on bed with rolled towels. No evidence of DVT. Negative [place]. Physical therapy started on day #1 for gait training and transfer. OT started day #1 for ADL and assisted devices.  Patient's foley was d/c on day #1. Patient's IV was d/c on day #2.  On post op day #3 patient was stable and ready for discharge to skilled nursing facility.    She was given perioperative antibiotics:  Anti-infectives (From admission, onward)    Start     Dose/Rate Route Frequency Ordered Stop   07/10/22  1630  ceFAZolin (ANCEF) IVPB 1 g/50 mL premix        1 g 100 mL/hr over 30 Minutes Intravenous Every 6 hours 07/10/22 1331 07/10/22 2207   07/10/22 0721  ceFAZolin (ANCEF) 2-4 GM/100ML-% IVPB       Note to Pharmacy: Trudie Reed S: cabinet override      07/10/22 0721 07/10/22 1217   07/10/22 0600  ceFAZolin (ANCEF) IVPB 2g/100 mL premix        2 g 200 mL/hr over 30 Minutes Intravenous On call to O.R. 07/10/22 0158 07/10/22 1044     .  She was given sequential compression devices, early ambulation, and Lovenox TEDs for DVT prophylaxis.  She benefited maximally from the hospital stay and there were no complications.    Recent vital signs:  Vitals:   07/12/22 0738 07/12/22 0749  BP:  133/61  Pulse:  (!) 102  Resp:    Temp:  99.5 F (37.5 C)  SpO2: 97% 93%    Recent laboratory studies:  Lab Results  Component Value Date   HGB 9.9 (L) 07/12/2022   HGB 10.7 (L) 07/11/2022   HGB 13.1 07/10/2022   Lab Results  Component Value Date   WBC 7.9 07/12/2022   PLT 152 07/12/2022   Lab Results  Component Value Date   INR 2.2 (H) 04/22/2009   Lab Results  Component Value Date   NA 130 (L) 07/11/2022   K 4.2 07/11/2022   CL 104 07/11/2022   CO2 24 07/11/2022   BUN 13 07/11/2022   CREATININE 0.87 07/11/2022   GLUCOSE 148 (H) 07/11/2022    Discharge Medications:   Allergies as of 07/12/2022       Reactions   Prednisone    REACTION: Increase eye pressure with gloucoma. Broke out in rash after injection per Dr. Percell Miller   Tramadol Itching   Adhesive [tape]    Causes blisters/irritates skin-surgical tape   Penicillins    REACTION: Rash   Sulfonamide Derivatives    REACTION: Rash - not sure        Medication List     TAKE these medications    albuterol (2.5 MG/3ML) 0.083% nebulizer solution Commonly known as: PROVENTIL Take 3 mLs (2.5 mg total) by nebulization every 6 (six) hours as needed for wheezing or shortness of breath. What changed: additional instructions    albuterol 108 (90 Base) MCG/ACT inhaler Commonly known as: VENTOLIN HFA Inhale 2 puffs into the lungs every 6 (six) hours as needed for wheezing or shortness of breath. What changed: Another medication with the same name was changed. Make sure you understand how and when to take each.   amLODipine 5 MG tablet Commonly known as: NORVASC Take one tablet (5 mg) by mouth daily at  dinner time What changed:  how much to take how to take this when to take this additional instructions   brimonidine 0.2 % ophthalmic solution Commonly known as: ALPHAGAN Place 1 drop into both eyes 2 (two) times daily. Place 1 drop into both eyes 2 (two) times daily.   carvedilol 12.5 MG tablet Commonly known as: COREG Take HALF tablet (6.25 mg) in MORNING, take WHOLE tablet (12.5 mg) in EVENING.   cetirizine 10 MG tablet Commonly known as: ZYRTEC Take 10 mg by mouth every morning.   cyclobenzaprine 10 MG tablet Commonly known as: FLEXERIL Take 10 mg by mouth 3 (three) times daily as needed for muscle spasms.   docusate sodium 100 MG capsule Commonly known as: COLACE Take 1 capsule (100 mg total) by mouth 2 (two) times daily.   dorzolamide 2 % ophthalmic solution Commonly known as: TRUSOPT Place 1 drop into both eyes 2 (two) times daily. Place 1 drop into both eyes 2 (two) times daily.   enoxaparin 40 MG/0.4ML injection Commonly known as: LOVENOX Inject 0.4 mLs (40 mg total) into the skin daily for 14 days. Start taking on: July 13, 2022   hydrALAZINE 25 MG tablet Commonly known as: APRESOLINE Take 2 tablets (50 mg total) by mouth 3 (three) times daily as needed (Take as needed for systolic blood pressure > 170.).   HYDROcodone-acetaminophen 5-325 MG tablet Commonly known as: NORCO/VICODIN Take 1-2 tablets by mouth every 4 (four) hours as needed for moderate pain (pain score 4-6).   levothyroxine 75 MCG tablet Commonly known as: SYNTHROID Take 75 mcg by mouth daily before breakfast.    LORazepam 0.5 MG tablet Commonly known as: ATIVAN Take 0.5 mg by mouth 2 (two) times daily as needed for anxiety (tremors).   losartan 100 MG tablet Commonly known as: COZAAR TAKE ONE TABLET BY MOUTH ONCE DAILY. What changed:  how much to take how to take this when to take this   Mucus Relief 400 MG Tabs tablet Generic drug: guaifenesin Take 1,200 mg by mouth every morning.   multivitamin with minerals tablet Take 1 tablet by mouth 3 (three) times a week.   pantoprazole 40 MG tablet Commonly known as: PROTONIX Take 1 tablet (40 mg total) by mouth daily before breakfast.   pantoprazole 40 MG tablet Commonly known as: PROTONIX Take 1 tablet by mouth every morning.   pravastatin 40 MG tablet Commonly known as: PRAVACHOL Take 1 tablet (40 mg total) by mouth at bedtime.   PROBIOTIC PO Take 1 tablet by mouth daily at 6 (six) AM.   sertraline 100 MG tablet Commonly known as: ZOLOFT Take 2 tablets by mouth every morning.   sodium chloride 0.65 % nasal spray Commonly known as: OCEAN Place 2 sprays into both nostrils daily at 6 (six) AM. Use as needed   Stiolto Respimat 2.5-2.5 MCG/ACT Aers Generic drug: Tiotropium Bromide-Olodaterol Inhale 2 puffs into the lungs daily. What changed: when to take this   SYSTANE OP Apply 1 drop to eye as needed.   traMADol 50 MG tablet Commonly known as: ULTRAM Take 1 tablet (50 mg total) by mouth every 6 (six) hours as needed. What changed:  when to take this reasons to take this   traZODone 50 MG tablet Commonly known as: DESYREL Take 50 mg by mouth at bedtime.   triamcinolone 55 MCG/ACT Aero nasal inhaler Commonly known as: NASACORT Place 1 spray into the nose at bedtime.   Tylenol 8 Hour Arthritis Pain 650 MG CR tablet  Generic drug: acetaminophen Take 650 mg by mouth every 8 (eight) hours as needed for pain. What changed: Another medication with the same name was removed. Continue taking this medication, and follow the  directions you see here.   VITAMIN D3 PO Take 1 tablet by mouth daily at 6 (six) AM.               Durable Medical Equipment  (From admission, onward)           Start     Ordered   07/10/22 1332  DME Walker rolling  Once       Question Answer Comment  Walker: With 5 Inch Wheels   Patient needs a walker to treat with the following condition Status post total hip replacement, right      07/10/22 1331   07/10/22 1332  DME 3 n 1  Once        07/10/22 1331   07/10/22 1332  DME Bedside commode  Once       Question:  Patient needs a bedside commode to treat with the following condition  Answer:  Status post total hip replacement, right   07/10/22 1331            Diagnostic Studies: DG HIP UNILAT W OR W/O PELVIS 2-3 VIEWS RIGHT  Result Date: 07/10/2022 CLINICAL DATA:  Right hip replacement. EXAM: DG HIP (WITH OR WITHOUT PELVIS) 2-3V RIGHT COMPARISON:  Intraoperative image 07/10/2022 FINDINGS: Right hip arthroplasty is located. Lucency in the soft tissues compatible with recent surgery. Skin staples are present. No evidence for a periprosthetic fracture. Evidence for a surgical drain. IMPRESSION: Right hip replacement without complicating features. Electronically Signed   By: Markus Daft M.D.   On: 07/10/2022 13:09   DG HIP UNILAT WITH PELVIS 1V RIGHT  Result Date: 07/10/2022 CLINICAL DATA:  Hip replacement EXAM: DG HIP (WITH OR WITHOUT PELVIS) 1V RIGHT COMPARISON:  None Available. FINDINGS: Fluoroscopic images were obtained intraoperatively and submitted for post operative interpretation. Right total hip arthroplasty with hardware in expected position, 2 images were obtained with 7 seconds of fluoroscopy time and 0.8 mGy. Please see the performing provider's procedural report for further detail. IMPRESSION: Fluoroscopic images demonstrate right total hip replacement. Electronically Signed   By: Yetta Glassman M.D.   On: 07/10/2022 12:28   DG C-Arm 1-60 Min-No Report  Result  Date: 07/10/2022 Fluoroscopy was utilized by the requesting physician.  No radiographic interpretation.   DG C-Arm 1-60 Min-No Report  Result Date: 07/10/2022 Fluoroscopy was utilized by the requesting physician.  No radiographic interpretation.   DG Chest 2 View  Result Date: 06/22/2022 CLINICAL DATA:  Preop exam. EXAM: CHEST - 2 VIEW COMPARISON:  07/11/2021 and earlier FINDINGS: Stable elevation of the RIGHT hemidiaphragm. Heart size is normal. Prominent interstitial markings appear stable, consistent with chronic process. No focal consolidations or pleural effusions. IMPRESSION: No evidence for acute  abnormality. Stable appearance of interstitial prominence. Electronically Signed   By: Nolon Nations M.D.   On: 06/22/2022 16:34    Disposition:      Follow-up Information     Duanne Guess, PA-C Follow up in 2 week(s).   Specialties: Orthopedic Surgery, Emergency Medicine Contact information: [place] [address] [phone]                  Signed: Feliberto Gottron 07/12/2022, 11:34 AM

## 2022-07-12 NOTE — Progress Notes (Signed)
Physical Therapy Treatment Patient Details Name: Christy Richardson MRN: [id number] DOB: 06-08-1935 Today's Date: 07/12/2022   History of Present Illness Patient is a 87 year old female with arthritis of right hip s/p right total hip arthroplasty anterior approach. History of bronchiectasis, dyspnea on exertion, IBS, previous left THA.    PT Comments    Patient continues to require physical assistance with mobility. She was seated on the toilet on arrival to room. She was able to ambulate back to the bed with assistance and was fatigued with minimal activity. She was shaky/trembling with standing activity and had mild wheezing noted after ambulation which she reports is due to her underlying lung issues. Sp02 94% on room air. Encouraged patient to use incentive spirometer often. Lower extremity strengthening exercises performed from the bed. Continue to recommend SNF at discharge due to continued physical assistance required with mobility.    Recommendations for follow up therapy are one component of a multi-disciplinary discharge planning process, led by the attending physician.  Recommendations may be updated based on patient status, additional functional criteria and insurance authorization.  Follow Up Recommendations  Skilled nursing-short term rehab (<3 hours/day) Can patient physically be transported by private vehicle: Yes   Assistance Recommended at Discharge Intermittent Supervision/Assistance  Patient can return home with the following A little help with walking and/or transfers;A little help with bathing/dressing/bathroom;Assist for transportation;Help with stairs or ramp for entrance;Assistance with cooking/housework   Equipment Recommendations  None recommended by PT    Recommendations for Other Services       Precautions / Restrictions Precautions Precautions: Fall Restrictions Weight Bearing Restrictions: Yes RLE Weight Bearing: Weight bearing as tolerated     Mobility   Bed Mobility Overal bed mobility: Needs Assistance Bed Mobility: Sit to Supine     Supine to sit: Mod assist Sit to supine: Mod assist   General bed mobility comments: assistance for BLE support. verbal cues for technique. increased time and effort required    Transfers Overall transfer level: Needs assistance Equipment used: Rolling walker (2 wheels) Transfers: Sit to/from Stand Sit to Stand: Min assist, From elevated surface           General transfer comment: steadying assistance and faciliation for anterior weight shifting required with standing from elevated toilet seat. cues for technique    Ambulation/Gait Ambulation/Gait assistance: Min assist Gait Distance (Feet): 12 Feet Assistive device: Rolling walker (2 wheels) Gait Pattern/deviations: Step-to pattern, Decreased stride length, Decreased stance time - right Gait velocity: decreased     General Gait Details: steadying assistance required with walking with occasional assistance required for negotiation of rolling walker. verbal cues for technique. she is fatigued with minimal activity and is shaky/trembling with standing activity. wheezing noted after ambulation with Sp02 94% on room air, heart rate 114bpm.   Stairs             Wheelchair Mobility    Modified Rankin (Stroke Patients Only)       Balance   Sitting-balance support: Feet supported Sitting balance-Leahy Scale: Fair     Standing balance support: Reliant on assistive device for balance, Bilateral upper extremity supported Standing balance-Leahy Scale: Poor Standing balance comment: external support required                            Cognition Arousal/Alertness: Awake/alert Behavior During Therapy: WFL for tasks assessed/performed Overall Cognitive Status: Within Functional Limits for tasks assessed  General Comments: patient is able to follow all commands without  difficulty        Exercises Total Joint Exercises Ankle Circles/Pumps: AROM, Strengthening, Both, 10 reps, Supine Short Arc Quad: AAROM, Strengthening, Right, 10 reps, Supine Heel Slides: AAROM, Strengthening, 10 reps, Right, Supine Hip ABduction/ADduction: AROM, Strengthening, Right, 10 reps, Supine Straight Leg Raises: AROM, Strengthening, Right, 10 reps, Supine Other Exercises Other Exercises: verbal cues for exercise technique    General Comments        Pertinent Vitals/Pain Pain Assessment Pain Assessment: Faces Faces Pain Scale: Hurts even more Pain Location: R hip with movement Pain Descriptors / Indicators: Discomfort, Grimacing Pain Intervention(s): Limited activity within patient's tolerance, Monitored during session, Ice applied    Home Living                          Prior Function            PT Goals (current goals can now be found in the care plan section) Acute Rehab PT Goals Patient Stated Goal: to walk more PT Goal Formulation: With patient/family Time For Goal Achievement: 07/24/22 Potential to Achieve Goals: Good Progress towards PT goals: Progressing toward goals    Frequency    BID      PT Plan Current plan remains appropriate    Co-evaluation              AM-PAC PT "6 Clicks" Mobility   Outcome Measure  Help needed turning from your back to your side while in a flat bed without using bedrails?: A Little Help needed moving from lying on your back to sitting on the side of a flat bed without using bedrails?: A Lot Help needed moving to and from a bed to a chair (including a wheelchair)?: A Lot Help needed standing up from a chair using your arms (e.g., wheelchair or bedside chair)?: A Lot Help needed to walk in hospital room?: A Lot Help needed climbing 3-5 steps with a railing? : Total 6 Click Score: 12    End of Session   Activity Tolerance: Patient limited by fatigue Patient left: in bed;with call bell/phone  within reach;with bed alarm set;with family/visitor present;with SCD's reapplied (ice pack applied R hip) Nurse Communication: Mobility status PT Visit Diagnosis: Unsteadiness on feet (R26.81);Muscle weakness (generalized) (M62.81)     Time: 9702-6378 PT Time Calculation (min) (ACUTE ONLY): 22 min  Charges:  $Therapeutic Exercise: 8-22 mins                     Minna Merritts, PT, MPT    Percell Locus 07/12/2022, 2:53 PM

## 2022-07-12 NOTE — Discharge Instructions (Signed)

## 2022-07-12 NOTE — Progress Notes (Addendum)
Subjective: 2 Days Post-Op Procedure(s) (LRB): TOTAL HIP ARTHROPLASTY ANTERIOR APPROACH (Right) Patient reports pain as mild.   Patient is well, and has had no acute complaints or problems Denies any CP, SOB, ABD pain. Mild dizziness with PT this am We will continue therapy today.    Objective: Vital signs in last 24 hours: Temp:  [98.2 F (36.8 C)-99.5 F (37.5 C)] 99.5 F (37.5 C) (08/10 0749) Pulse Rate:  [88-106] 102 (08/10 0749) Resp:  [16-18] 18 (08/10 0555) BP: (113-151)/(48-77) 133/61 (08/10 0749) SpO2:  [92 %-100 %] 93 % (08/10 0749) Weight:  [67.5 kg] 67.5 kg (08/10 0738)  Intake/Output from previous day: 08/09 0701 - 08/10 0700 In: 365.8 [P.O.:240; I.V.:125.8] Out: 600 [Urine:600] Intake/Output this shift: Total I/O In: 360 [P.O.:360] Out: -   Recent Labs    07/10/22 0810 07/11/22 0629 07/12/22 0548  HGB 13.1 10.7* 9.9*   Recent Labs    07/11/22 0629 07/12/22 0548  WBC 7.8 7.9  RBC 3.69* 3.36*  HCT 32.4* 29.5*  PLT 165 152   Recent Labs    07/10/22 1407 07/11/22 0629  NA  --  130*  K  --  4.2  CL  --  104  CO2  --  24  BUN  --  13  CREATININE 0.93 0.87  GLUCOSE  --  148*  CALCIUM  --  8.2*   No results for input(s): "LABPT", "INR" in the last 72 hours.  EXAM General - Patient is Alert, Appropriate, and Oriented Extremity - Neurovascular intact Sensation intact distally Intact pulses distally Dorsiflexion/Plantar flexion intact No cellulitis present Compartment soft Dressing - dressing C/D/I and no drainage, provena intact with out drainage Motor Function - intact, moving foot and toes well on exam.   Past Medical History:  Diagnosis Date   Adenomatous polyp of colon 03/2007   Allergic rhinitis    Anxiety    a.) on BZO (lorazepam) PRN   Arthritis    Blood transfusion 1974   Bronchiectasis ([place])    Cancer ([place])    left arm   Deaf    in right ear-hearing aid in left   Depression    Diastolic dysfunction    a.) TTE  01/2014: EF 60-65%, no rwma, Gr1 DD, Ao sclerosis w/o stenosis. Mild TR; b.) TTE 07/16/2019: EF 60-65%, triv AR, mild MR/TR, G1DD.   Diet-controlled type 2 diabetes mellitus ([place])    Diverticulosis of colon (without mention of hemorrhage)    DOE (dyspnea on exertion)    Dyspnea    Fatigue    Fecal incontinence    GERD (gastroesophageal reflux disease)    Glaucoma    Goiter    right side   Heart murmur    History of stress test    a. 01/2014 Lexi MV: no ischemia/infarct.   Hyperkalemia    Hyperlipidemia    Hypertension    a. 11/2018 Renal artery duplex: No RAS.    Hypothyroidism    IBS (irritable bowel syndrome)    Migraine    Mitral valve prolapse    a. 01/2014 Echo: no MR/MVP noted.   Neck mass    right side/ no airway problems/ per pt has had since 2005 (goiter)   Nonspecific abnormal electrocardiogram (ECG) (EKG)    Palpitations    a. 01/2014 CardioNet: wore for 9 days (rash). Sinus tach - 100's. No SVT/AF.   Pneumonia    Polyp, sigmoid colon    Post-operative nausea and vomiting    Pulmonary  fibrosis ([place])    Pulmonary nodules    Recurrent UTI    Tachycardia    Tremor     Assessment/Plan:   2 Days Post-Op Procedure(s) (LRB): TOTAL HIP ARTHROPLASTY ANTERIOR APPROACH (Right) Principal Problem:   Status post total hip replacement, right Active Problems:   S/P total right hip arthroplasty  Estimated body mass index is 22.63 kg/m as calculated from the following:   Height as of this encounter: '5\' 8"'$  (1.727 m).   Weight as of this encounter: 67.5 kg. Advance diet Up with therapy Pain controlled VSS  Labs stable Mild dizziness - will give 500 cc bolus of IV fluids CM to assist with discharge.  DVT Prophylaxis - Lovenox, TED hose, and SCDs Weight-Bearing as tolerated to right leg   T. Rachelle Hora, PA-C [place] 07/12/2022, 11:26 AM

## 2022-07-13 DIAGNOSIS — R2681 Unsteadiness on feet: Secondary | ICD-10-CM | POA: Diagnosis not present

## 2022-07-13 DIAGNOSIS — M169 Osteoarthritis of hip, unspecified: Secondary | ICD-10-CM | POA: Diagnosis not present

## 2022-07-13 DIAGNOSIS — I10 Essential (primary) hypertension: Secondary | ICD-10-CM | POA: Diagnosis not present

## 2022-07-13 DIAGNOSIS — J479 Bronchiectasis, uncomplicated: Secondary | ICD-10-CM | POA: Diagnosis not present

## 2022-07-13 DIAGNOSIS — I2510 Atherosclerotic heart disease of native coronary artery without angina pectoris: Secondary | ICD-10-CM | POA: Diagnosis not present

## 2022-07-13 DIAGNOSIS — Z7401 Bed confinement status: Secondary | ICD-10-CM | POA: Diagnosis not present

## 2022-07-13 DIAGNOSIS — F418 Other specified anxiety disorders: Secondary | ICD-10-CM | POA: Diagnosis not present

## 2022-07-13 DIAGNOSIS — M6281 Muscle weakness (generalized): Secondary | ICD-10-CM | POA: Diagnosis not present

## 2022-07-13 DIAGNOSIS — I959 Hypotension, unspecified: Secondary | ICD-10-CM | POA: Diagnosis not present

## 2022-07-13 DIAGNOSIS — G250 Essential tremor: Secondary | ICD-10-CM | POA: Diagnosis not present

## 2022-07-13 DIAGNOSIS — K219 Gastro-esophageal reflux disease without esophagitis: Secondary | ICD-10-CM | POA: Diagnosis not present

## 2022-07-13 DIAGNOSIS — K588 Other irritable bowel syndrome: Secondary | ICD-10-CM | POA: Diagnosis not present

## 2022-07-13 DIAGNOSIS — E782 Mixed hyperlipidemia: Secondary | ICD-10-CM | POA: Diagnosis not present

## 2022-07-13 DIAGNOSIS — E039 Hypothyroidism, unspecified: Secondary | ICD-10-CM | POA: Diagnosis not present

## 2022-07-13 DIAGNOSIS — E038 Other specified hypothyroidism: Secondary | ICD-10-CM | POA: Diagnosis not present

## 2022-07-13 DIAGNOSIS — R262 Difficulty in walking, not elsewhere classified: Secondary | ICD-10-CM | POA: Diagnosis not present

## 2022-07-13 DIAGNOSIS — E785 Hyperlipidemia, unspecified: Secondary | ICD-10-CM | POA: Diagnosis not present

## 2022-07-13 DIAGNOSIS — Z96641 Presence of right artificial hip joint: Secondary | ICD-10-CM | POA: Diagnosis not present

## 2022-07-13 DIAGNOSIS — M6259 Muscle wasting and atrophy, not elsewhere classified, multiple sites: Secondary | ICD-10-CM | POA: Diagnosis not present

## 2022-07-13 DIAGNOSIS — Z4789 Encounter for other orthopedic aftercare: Secondary | ICD-10-CM | POA: Diagnosis not present

## 2022-07-13 DIAGNOSIS — M150 Primary generalized (osteo)arthritis: Secondary | ICD-10-CM | POA: Diagnosis not present

## 2022-07-13 LAB — CBC
HCT: 27.8 % — ABNORMAL LOW (ref 36.0–46.0)
Hemoglobin: 9.2 g/dL — ABNORMAL LOW (ref 12.0–15.0)
MCH: 29.2 pg (ref 26.0–34.0)
MCHC: 33.1 g/dL (ref 30.0–36.0)
MCV: 88.3 fL (ref 80.0–100.0)
Platelets: 158 10*3/uL (ref 150–400)
RBC: 3.15 MIL/uL — ABNORMAL LOW (ref 3.87–5.11)
RDW: 13.1 % (ref 11.5–15.5)
WBC: 8.6 10*3/uL (ref 4.0–10.5)
nRBC: 0 % (ref 0.0–0.2)

## 2022-07-13 MED ORDER — HYDROCODONE-ACETAMINOPHEN 5-325 MG PO TABS: 1.0000 | ORAL_TABLET | ORAL | 0 refills | Status: DC | PRN

## 2022-07-13 MED ORDER — TRAMADOL HCL 50 MG PO TABS: 50.0000 mg | ORAL_TABLET | Freq: Four times a day (QID) | ORAL | 0 refills | Status: AC | PRN

## 2022-07-13 NOTE — Progress Notes (Signed)
Vitals entered manually ° °

## 2022-07-13 NOTE — TOC Progression Note (Signed)
Transition of Care [facility]) - Progression Note    Patient Details  Name: Christy Richardson MRN: [id number] Date of Birth: 06/15/1935  [facility]) CM/SW [place], RN Phone Number: 07/13/2022, 2:30 PM  Clinical Narrative:     [place] and inquired about the auth, it is still pending at this time, The facility has everything for her admittance and she can go anytime today if we get ins approval  Expected Discharge Plan: [place] Barriers to Discharge: Barriers Resolved  Expected Discharge Plan and Services Expected Discharge Plan: [place]   Discharge Planning Services: CM Consult   Living arrangements for the past 2 months: Single Family Home Expected Discharge Date: 07/13/22               DME Arranged: N/A DME Agency: NA       HH Arranged: PT HH Agency: [place] Date HH Agency Contacted: 07/11/22 Time HH Agency Contacted: [phone] Representative spoke with at [place]: [place] Determinants of Health ([place] [facility] [place]) Interventions    Readmission Risk Interventions     No data to display

## 2022-07-13 NOTE — Progress Notes (Signed)
Pt placed on preveena wound vac, report called to [place] at receiving facility. Awaiting transport. Patient medicated w/ norco and lorazepam for increased anxiety regarding her transport.

## 2022-07-13 NOTE — Progress Notes (Signed)
Physical Therapy Treatment Patient Details Name: Christy Richardson MRN: [id number] DOB: 06-06-1935 Today's Date: 07/13/2022   History of Present Illness Patient is a 87 year old female with arthritis of right hip s/p right total hip arthroplasty anterior approach. History of bronchiectasis, dyspnea on exertion, IBS, previous left THA.    PT Comments    Patient agreeable to second PT session today. She is fatigued, sitting up in the chair with a daughter present at bedside. She continues to require physical assistance for standing. She increased her ambulation distance in hallway today with pain reported with weight bearing. Limited standing endurance for progression of further activity. Recommend to continue PT to maximize independence and facilitate return to prior level of function.    Recommendations for follow up therapy are one component of a multi-disciplinary discharge planning process, led by the attending physician.  Recommendations may be updated based on patient status, additional functional criteria and insurance authorization.  Follow Up Recommendations  Skilled nursing-short term rehab (<3 hours/day) Can patient physically be transported by private vehicle: Yes   Assistance Recommended at Discharge Intermittent Supervision/Assistance  Patient can return home with the following A little help with walking and/or transfers;A little help with bathing/dressing/bathroom;Assist for transportation;Help with stairs or ramp for entrance;Assistance with cooking/housework   Equipment Recommendations  None recommended by PT    Recommendations for Other Services       Precautions / Restrictions Precautions Precautions: Fall Restrictions Weight Bearing Restrictions: Yes RLE Weight Bearing: Weight bearing as tolerated     Mobility  Bed Mobility               General bed mobility comments: not assessed as patient sitting up on arrival and post session    Transfers Overall  transfer level: Needs assistance Equipment used: Rolling walker (2 wheels) Transfers: Sit to/from Stand Sit to Stand: Min assist           General transfer comment: lifting assistance required for standing. verbal cues for hand placement for safety    Ambulation/Gait Ambulation/Gait assistance: Min guard Gait Distance (Feet): 128 Feet Assistive device: Rolling walker (2 wheels) Gait Pattern/deviations: Step-to pattern, Decreased step length - right, Decreased stride length Gait velocity: decreased     General Gait Details: increasing step length on RLE with increased ambulation distance. verbal cues for rolling walker placement   Stairs             Wheelchair Mobility    Modified Rankin (Stroke Patients Only)       Balance Overall balance assessment: Needs assistance Sitting-balance support: Feet supported Sitting balance-Leahy Scale: Good     Standing balance support: Reliant on assistive device for balance, Bilateral upper extremity supported Standing balance-Leahy Scale: Fair Standing balance comment: using rolling walker for UE support                            Cognition Arousal/Alertness: Awake/alert Behavior During Therapy: WFL for tasks assessed/performed Overall Cognitive Status: Within Functional Limits for tasks assessed                                 General Comments: patient able to follow all commands today        Exercises      General Comments General comments (skin integrity, edema, etc.): gait belt provided to the patient per daugther request.      Pertinent  Vitals/Pain Pain Assessment Pain Assessment: 0-10 Faces Pain Scale: Hurts even more Pain Location: R hip Pain Descriptors / Indicators: Discomfort, Grimacing Pain Intervention(s): Limited activity within patient's tolerance, Monitored during session    Home Living                          Prior Function            PT Goals  (current goals can now be found in the care plan section) Acute Rehab PT Goals Patient Stated Goal: to walk more PT Goal Formulation: With patient Time For Goal Achievement: 07/24/22 Potential to Achieve Goals: Good Progress towards PT goals: Progressing toward goals    Frequency    BID      PT Plan Current plan remains appropriate    Co-evaluation              AM-PAC PT "6 Clicks" Mobility   Outcome Measure  Help needed turning from your back to your side while in a flat bed without using bedrails?: A Little Help needed moving from lying on your back to sitting on the side of a flat bed without using bedrails?: A Lot Help needed moving to and from a bed to a chair (including a wheelchair)?: A Little Help needed standing up from a chair using your arms (e.g., wheelchair or bedside chair)?: A Little Help needed to walk in hospital room?: A Little Help needed climbing 3-5 steps with a railing? : Total 6 Click Score: 15    End of Session Equipment Utilized During Treatment: Gait belt Activity Tolerance: Patient tolerated treatment well;Patient limited by fatigue Patient left: in chair;with call bell/phone within reach;with family/visitor present (ice pack R hip) Nurse Communication: Mobility status PT Visit Diagnosis: Unsteadiness on feet (R26.81);Muscle weakness (generalized) (M62.81)     Time: 6294-7654 PT Time Calculation (min) (ACUTE ONLY): 20 min  Charges:  $Gait Training: 8-22 mins                     Minna Merritts, PT, MPT    Percell Locus 07/13/2022, 3:09 PM

## 2022-07-13 NOTE — Progress Notes (Signed)
Subjective: 3 Days Post-Op Procedure(s) (LRB): TOTAL HIP ARTHROPLASTY ANTERIOR APPROACH (Right) Patient reports pain as mild.   Patient is well, and has had no acute complaints or problems Denies any CP, SOB, ABD pain.  We will continue therapy today.  + BM   Objective: Vital signs in last 24 hours: Temp:  [97.8 F (36.6 C)-99.9 F (37.7 C)] 98.1 F (36.7 C) (08/11 0404) Pulse Rate:  [87-108] 108 (08/11 0404) Resp:  [16-18] 16 (08/11 0404) BP: (99-152)/(42-75) 152/75 (08/11 0404) SpO2:  [90 %-95 %] 90 % (08/11 0432)  Intake/Output from previous day: 08/10 0701 - 08/11 0700 In: 5098.4 [P.O.:1440; I.V.:3158.2; IV Piggyback:500.1] Out: 0  Intake/Output this shift: No intake/output data recorded.  Recent Labs    07/10/22 0810 07/11/22 0629 07/12/22 0548 07/13/22 0543  HGB 13.1 10.7* 9.9* 9.2*   Recent Labs    07/12/22 0548 07/13/22 0543  WBC 7.9 8.6  RBC 3.36* 3.15*  HCT 29.5* 27.8*  PLT 152 158   Recent Labs    07/10/22 1407 07/11/22 0629  NA  --  130*  K  --  4.2  CL  --  104  CO2  --  24  BUN  --  13  CREATININE 0.93 0.87  GLUCOSE  --  148*  CALCIUM  --  8.2*   No results for input(s): "LABPT", "INR" in the last 72 hours.  EXAM General - Patient is Alert, Appropriate, and Oriented Extremity - Neurovascular intact Sensation intact distally Intact pulses distally Dorsiflexion/Plantar flexion intact No cellulitis present Compartment soft Dressing - dressing C/D/I and no drainage, provena intact with out drainage Motor Function - intact, moving foot and toes well on exam.   Past Medical History:  Diagnosis Date   Adenomatous polyp of colon 03/2007   Allergic rhinitis    Anxiety    a.) on BZO (lorazepam) PRN   Arthritis    Blood transfusion 1974   Bronchiectasis ([place])    Cancer ([place])    left arm   Deaf    in right ear-hearing aid in left   Depression    Diastolic dysfunction    a.) TTE 01/2014: EF 60-65%, no rwma, Gr1 DD, Ao sclerosis  w/o stenosis. Mild TR; b.) TTE 07/16/2019: EF 60-65%, triv AR, mild MR/TR, G1DD.   Diet-controlled type 2 diabetes mellitus ([place])    Diverticulosis of colon (without mention of hemorrhage)    DOE (dyspnea on exertion)    Dyspnea    Fatigue    Fecal incontinence    GERD (gastroesophageal reflux disease)    Glaucoma    Goiter    right side   Heart murmur    History of stress test    a. 01/2014 Lexi MV: no ischemia/infarct.   Hyperkalemia    Hyperlipidemia    Hypertension    a. 11/2018 Renal artery duplex: No RAS.    Hypothyroidism    IBS (irritable bowel syndrome)    Migraine    Mitral valve prolapse    a. 01/2014 Echo: no MR/MVP noted.   Neck mass    right side/ no airway problems/ per pt has had since 2005 (goiter)   Nonspecific abnormal electrocardiogram (ECG) (EKG)    Palpitations    a. 01/2014 CardioNet: wore for 9 days (rash). Sinus tach - 100's. No SVT/AF.   Pneumonia    Polyp, sigmoid colon    Post-operative nausea and vomiting    Pulmonary fibrosis ([facility])    Pulmonary nodules  Recurrent UTI    Tachycardia    Tremor     Assessment/Plan:   3 Days Post-Op Procedure(s) (LRB): TOTAL HIP ARTHROPLASTY ANTERIOR APPROACH (Right) Principal Problem:   Status post total hip replacement, right Active Problems:   S/P total right hip arthroplasty  Estimated body mass index is 22.63 kg/m as calculated from the following:   Height as of this encounter: '5\' 8"'$  (1.727 m).   Weight as of this encounter: 67.5 kg. Advance diet Up with therapy Pain controlled VSS  Labs stable BP improved. Continue to monitor CM to assist with discharge to SNF  DVT Prophylaxis - Lovenox, TED hose, and SCDs Weight-Bearing as tolerated to right leg   T. Rachelle Hora, PA-C [place] 07/13/2022, 8:08 AM

## 2022-07-13 NOTE — TOC Progression Note (Signed)
Transition of Care [facility]) - Progression Note    Patient Details  Name: Christy Richardson MRN: [id number] Date of Birth: July 19, 1935  Transition of Care [facility]) CM/SW [place], RN Phone Number: 07/13/2022, 3:21 PM  Clinical Narrative:    EMS Approved by Ins auth number [id number], I called EMS to arrange transport, her nurse will call report to [facility]   Expected Discharge Plan: [place] Barriers to Discharge: Barriers Resolved  Expected Discharge Plan and Services Expected Discharge Plan: [place]   Discharge Planning Services: CM Consult   Living arrangements for the past 2 months: Single Family Home Expected Discharge Date: 07/13/22               DME Arranged: N/A DME Agency: NA       HH Arranged: PT HH Agency: [place] Date HH Agency Contacted: 07/11/22 Time HH Agency Contacted: 1024 Representative spoke with at [place]: [place] Determinants of Health ([place]) Interventions    Readmission Risk Interventions     No data to display

## 2022-07-13 NOTE — TOC Progression Note (Signed)
Transition of Care [facility]) - Progression Note    Patient Details  Name: Christy Richardson MRN: [id number] Date of Birth: Jul 26, 1935  Transition of Care [facility]) CM/SW [place], RN Phone Number: 07/13/2022, 3:08 PM  Clinical Narrative:   Received ins auth for the patient to go to [facility], She will go via EMS transport  I notified the patient and her daughter    Expected Discharge Plan: [place] Barriers to Discharge: Barriers Resolved  Expected Discharge Plan and Services Expected Discharge Plan: [place]   Discharge Planning Services: CM Consult   Living arrangements for the past 2 months: Single Family Home Expected Discharge Date: 07/13/22               DME Arranged: N/A DME Agency: NA       HH Arranged: PT HH Agency: [place] Date HH Agency Contacted: 07/11/22 Time HH Agency Contacted: 1024 Representative spoke with at [place]: [place] Determinants of Health ([place]) Interventions    Readmission Risk Interventions     No data to display

## 2022-07-13 NOTE — TOC Progression Note (Signed)
Transition of Care [facility]) - Progression Note    Patient Details  Name: Christy Richardson MRN: [id number] Date of Birth: 03-06-1935  Transition of Care [facility]) CM/SW [place], RN Phone Number: 07/13/2022, 10:52 AM  Clinical Narrative:    Spoke with the patient and her daughter in the room, reviewed that [facility] made a bed offer, they are accepting the bed offer, I called [facility] and requested ins auth as well as EMS auth, awaiting approval to go to [place], she is still going to call her other daughter to discuss and will let me know the outcome but the plan is to go to [facility],    Expected Discharge Plan: [place] Barriers to Discharge: Barriers Resolved  Expected Discharge Plan and Services Expected Discharge Plan: [place]   Discharge Planning Services: CM Consult   Living arrangements for the past 2 months: Single Family Home                 DME Arranged: N/A DME Agency: NA       HH Arranged: PT HH Agency: [place] Date [facility] Agency Contacted: 07/11/22 Time HH Agency Contacted: 1024 Representative spoke with at [place]: [place]   Social Determinants of Health (SDOH) Interventions    Readmission Risk Interventions     No data to display

## 2022-07-13 NOTE — Plan of Care (Signed)
  Problem: Education: Goal: Knowledge of General Education information will improve Description: Including pain rating scale, medication(s)/side effects and non-pharmacologic comfort measures Outcome: Adequate for Discharge   Problem: Health Behavior/Discharge Planning: Goal: Ability to manage health-related needs will improve Outcome: Adequate for Discharge   Problem: Clinical Measurements: Goal: Ability to maintain clinical measurements within normal limits will improve Outcome: Adequate for Discharge Goal: Will remain free from infection Outcome: Adequate for Discharge Goal: Diagnostic test results will improve Outcome: Adequate for Discharge Goal: Respiratory complications will improve Outcome: Adequate for Discharge Goal: Cardiovascular complication will be avoided Outcome: Adequate for Discharge   Problem: Activity: Goal: Risk for activity intolerance will decrease Outcome: Adequate for Discharge   Problem: Nutrition: Goal: Adequate nutrition will be maintained Outcome: Adequate for Discharge   Problem: Coping: Goal: Level of anxiety will decrease Outcome: Adequate for Discharge   Problem: Elimination: Goal: Will not experience complications related to bowel motility Outcome: Adequate for Discharge Goal: Will not experience complications related to urinary retention Outcome: Adequate for Discharge   Problem: Pain Managment: Goal: General experience of comfort will improve Outcome: Adequate for Discharge   Problem: Safety: Goal: Ability to remain free from injury will improve Outcome: Adequate for Discharge   Problem: Skin Integrity: Goal: Risk for impaired skin integrity will decrease Outcome: Adequate for Discharge   Problem: Acute Rehab PT Goals(only PT should resolve) Goal: Patient Will Transfer Sit To/From Stand Outcome: Adequate for Discharge Goal: Pt Will Ambulate Outcome: Adequate for Discharge Goal: Pt Will Go Up/Down Stairs Outcome: Adequate for  Discharge   Problem: Acute Rehab OT Goals (only OT should resolve) Goal: Pt. Will Perform Grooming Outcome: Adequate for Discharge Goal: Pt. Will Perform Upper Body Dressing Outcome: Adequate for Discharge Goal: Pt. Will Perform Lower Body Dressing Outcome: Adequate for Discharge Goal: Pt. Will Transfer To Toilet Outcome: Adequate for Discharge Goal: Pt. Will Perform Toileting-Clothing Manipulation Outcome: Adequate for Discharge

## 2022-07-13 NOTE — Progress Notes (Signed)
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 History of bronchiectasis, dyspnea on exertion, IBS, previous left THA.    PT Comments    Patient is making progress with functional independence and activity tolerance, however she still requires physical assistance for bed mobility and transfers. She ambulated 152f with rolling walker today in hallway with occasional cues for rolling walker and bilateral LE sequencing. No dizziness is reported with ambulation today. No wheezing or significant shortness of breath was noted with Sp02 93% on room air after walking. SNF is still appropriate as patient is concerned about the level of assistance required for mobility at this time. PT will continue to follow to maximize independence and decrease caregiver burden.    Recommendations for follow up therapy are one component of a multi-disciplinary discharge planning process, led by the attending physician.  Recommendations may be updated based on patient status, additional functional criteria and insurance authorization.  Follow Up Recommendations  Skilled nursing-short term rehab (<3 hours/day) Can patient physically be transported by private vehicle: Yes   Assistance Recommended at Discharge Intermittent Supervision/Assistance  Patient can return home with the following A little help with walking and/or transfers;A little help with bathing/dressing/bathroom;Assist for transportation;Help with stairs or ramp for entrance;Assistance with cooking/housework   Equipment Recommendations  None recommended by PT    Recommendations for Other Services       Precautions / Restrictions Precautions Precautions: Fall Restrictions Weight Bearing Restrictions: Yes RLE Weight Bearing: Weight bearing  as tolerated     Mobility  Bed Mobility               General bed mobility comments: not assessed as patient sitting up on arrival and post session    Transfers Overall transfer level: Needs assistance Equipment used: Rolling walker (2 wheels) Transfers: Sit to/from Stand Sit to Stand: Mod assist           General transfer comment: lifting and lowering assistance required for standing from recliner chair. verbal cues for safe hand placement. increased time and effort required    Ambulation/Gait Ambulation/Gait assistance: Min guard Gait Distance (Feet): 100 Feet Assistive device: Rolling walker (2 wheels) Gait Pattern/deviations: Step-to pattern, Step-through pattern, Decreased stance time - right, Decreased stride length Gait velocity: decreased     General Gait Details: occasional verbal cues for sequencing of BLE and rolling walker. cues for upright posture and placement of rolling walker to avoid trunk flexion. no wheezing or shortness of breath is noted today during session. she was fatigued after walking and mildly gittery (she reports having a breathing treatment earlier). Sp02 93% on room air after walking, heart rate 113bpm   Stairs             Wheelchair Mobility    Modified Rankin (Stroke Patients Only)       Balance   Sitting-balance support: Feet supported Sitting balance-Leahy Scale: Fair     Standing balance support: Reliant on assistive device for balance, Bilateral upper extremity supported Standing balance-Leahy Scale: Poor Standing balance comment: Min A initially required to facilitate anterior weight shifting, then Min guard for safety. rolling walker used for UE support                            Cognition  Arousal/Alertness: Awake/alert Behavior During Therapy: WFL for tasks assessed/performed Overall Cognitive Status: Within Functional Limits for tasks assessed                                  General Comments: patient able to follow all commands today        Exercises      General Comments        Pertinent Vitals/Pain Pain Assessment Pain Assessment: Faces Faces Pain Scale: Hurts even more Pain Location: R hip with movement Pain Descriptors / Indicators: Discomfort, Grimacing Pain Intervention(s): Limited activity within patient's tolerance, Monitored during session, Ice applied    Home Living                          Prior Function            PT Goals (current goals can now be found in the care plan section) Acute Rehab PT Goals Patient Stated Goal: to walk more PT Goal Formulation: With patient Time For Goal Achievement: 07/24/22 Potential to Achieve Goals: Good Progress towards PT goals: Progressing toward goals    Frequency    BID      PT Plan Current plan remains appropriate    Co-evaluation              AM-PAC PT "6 Clicks" Mobility   Outcome Measure  Help needed turning from your back to your side while in a flat bed without using bedrails?: A Little Help needed moving from lying on your back to sitting on the side of a flat bed without using bedrails?: A Lot Help needed moving to and from a bed to a chair (including a wheelchair)?: A Lot Help needed standing up from a chair using your arms (e.g., wheelchair or bedside chair)?: A Lot Help needed to walk in hospital room?: A Little Help needed climbing 3-5 steps with a railing? : A Lot 6 Click Score: 14    End of Session Equipment Utilized During Treatment: Gait belt Activity Tolerance: Patient tolerated treatment well;Patient limited by fatigue Patient left: in chair;with call bell/phone within reach;with SCD's reapplied (ice pack appled R groin. daughter present at the end of session) Nurse Communication: Mobility status PT Visit Diagnosis: Unsteadiness on feet (R26.81);Muscle weakness (generalized) (M62.81)     Time: 6754-4920 PT Time Calculation (min) (ACUTE  ONLY): 20 min  Charges:  $Gait Training: 8-22 mins                    Christy Richardson, PT, MPT    Christy Richardson 07/13/2022, 9:03 AM

## 2022-07-13 NOTE — Progress Notes (Signed)
Occupational Therapy Treatment Patient Details Name: Christy Richardson MRN: [id number] DOB: Oct 12, 1935 Today's Date: 07/13/2022   History of present illness Patient is a 87 year old female with arthritis of right hip s/p right total hip arthroplasty anterior approach. History of bronchiectasis, dyspnea on exertion, IBS, previous left THA.   OT comments  Upon entering session, pt in the process of transferring from recliner > chair with daughter. Pt still attached to SCD in standing, OT intervening and removing wraps around BLEs before proceeding with transfer to bed with Min guard + RW. Pt deferring further OOB mobility, toileting, or self-care tasks. Pt requesting to return to bed and required Max A for sit to supine 2/2 RLE pain. Pt with significant pain in R hip throughout session (8/10), Mod A to reposition in bed, ice pack applied to R hip for pain management at end of session. Continue OT POC.   Recommendations for follow up therapy are one component of a multi-disciplinary discharge planning process, led by the attending physician.  Recommendations may be updated based on patient status, additional functional criteria and insurance authorization.    Follow Up Recommendations  Skilled nursing-short term rehab (<3 hours/day)    Assistance Recommended at Discharge Intermittent Supervision/Assistance  Patient can return home with the following  A lot of help with bathing/dressing/bathroom;A lot of help with walking and/or transfers;Assistance with cooking/housework;Assist for transportation   Equipment Recommendations  Other (comment) (defer to next venue of care)          Precautions / Restrictions Precautions Precautions: Fall Restrictions Weight Bearing Restrictions: Yes RLE Weight Bearing: Weight bearing as tolerated       Mobility Bed Mobility Overal bed mobility: Needs Assistance Bed Mobility: Sit to Supine       Sit to supine: Max assist   General bed mobility  comments: Max A for sit to supine 2/2 R hip pain Patient Response: Anxious, Cooperative  Transfers Overall transfer level: Needs assistance Equipment used: Rolling walker (2 wheels) Transfers: Bed to chair/wheelchair/BSC   Stand pivot transfers: Min guard         General transfer comment: Pt received in the process of going from recliner to bed, required assistance for RW management in tight transfer space, VC for hand placement.     Balance Overall balance assessment: Needs assistance Sitting-balance support: Feet supported Sitting balance-Leahy Scale: Good Sitting balance - Comments: good static sitting balance at EOB   Standing balance support: Reliant on assistive device for balance, Bilateral upper extremity supported Standing balance-Leahy Scale: Fair Standing balance comment: RW used for BUE support, Min guard for safety                           ADL either performed or assessed with clinical judgement   ADL Overall ADL's : Needs assistance/impaired                                     Functional mobility during ADLs: Min guard;Rolling walker (2 wheels)   General ADL comments: Pt deferring further OOB mobility and ADL tasks, pt reporting "I got washed up already this morning". Pt denied needing to complete toileting while OT was present in room.     Extremity/Trunk Assessment Upper Extremity Assessment Upper Extremity Assessment: Generalized weakness   Lower Extremity Assessment Lower Extremity Assessment: Generalized weakness   Cervical / Trunk Assessment Cervical /  Trunk Assessment: Normal    Vision Baseline Vision/History: 1 Wears glasses Patient Visual Report: No change from baseline                Cognition Arousal/Alertness: Awake/alert Behavior During Therapy: WFL for tasks assessed/performed Overall Cognitive Status: Within Functional Limits for tasks assessed                                                              Pertinent Vitals/ Pain       Pain Assessment Pain Assessment: 0-10 Pain Score: 8  Pain Location: R hip Pain Descriptors / Indicators: Discomfort, Grimacing Pain Intervention(s): Limited activity within patient's tolerance, Monitored during session, Repositioned, Ice applied                                                          Frequency  Min 2X/week        Progress Toward Goals  OT Goals(current goals can now be found in the care plan section)  Progress towards OT goals: Progressing toward goals  Acute Rehab OT Goals Patient Stated Goal: get stronger, reduce pain OT Goal Formulation: With patient/family Time For Goal Achievement: 07/25/22 Potential to Achieve Goals: Good  Plan Discharge plan remains appropriate;Frequency remains appropriate                     AM-PAC OT "6 Clicks" Daily Activity     Outcome Measure   Help from another person eating meals?: None Help from another person taking care of personal grooming?: A Little Help from another person toileting, which includes using toliet, bedpan, or urinal?: A Lot Help from another person bathing (including washing, rinsing, drying)?: A Lot Help from another person to put on and taking off regular upper body clothing?: A Little Help from another person to put on and taking off regular lower body clothing?: A Lot 6 Click Score: 16    End of Session Equipment Utilized During Treatment: Gait belt;Rolling walker (2 wheels)  OT Visit Diagnosis: Other abnormalities of gait and mobility (R26.89);History of falling (Z91.81);Pain;Muscle weakness (generalized) (M62.81) Pain - Right/Left: Right Pain - part of body: Hip   Activity Tolerance Patient limited by pain;Patient limited by fatigue   Patient Left in bed;with SCD's reapplied;with call bell/phone within reach;with family/visitor present   Nurse Communication Mobility status        Time:  8786-7672 OT Time Calculation (min): 17 min  Charges: OT General Charges $OT Visit: 1 Visit OT Treatments $Self Care/Home Management : 8-22 mins  [facility] MS, OTR/L ascom [phone]  07/13/22, 12:53 PM

## 2022-07-13 NOTE — Plan of Care (Signed)

## 2022-07-16 DIAGNOSIS — E038 Other specified hypothyroidism: Secondary | ICD-10-CM | POA: Diagnosis not present

## 2022-07-16 DIAGNOSIS — Z96641 Presence of right artificial hip joint: Secondary | ICD-10-CM | POA: Diagnosis not present

## 2022-07-16 DIAGNOSIS — I10 Essential (primary) hypertension: Secondary | ICD-10-CM | POA: Diagnosis not present

## 2022-07-16 DIAGNOSIS — M6281 Muscle weakness (generalized): Secondary | ICD-10-CM | POA: Diagnosis not present

## 2022-07-17 DIAGNOSIS — K219 Gastro-esophageal reflux disease without esophagitis: Secondary | ICD-10-CM | POA: Diagnosis not present

## 2022-07-17 DIAGNOSIS — Z4789 Encounter for other orthopedic aftercare: Secondary | ICD-10-CM | POA: Diagnosis not present

## 2022-07-17 DIAGNOSIS — K588 Other irritable bowel syndrome: Secondary | ICD-10-CM | POA: Diagnosis not present

## 2022-07-17 DIAGNOSIS — G250 Essential tremor: Secondary | ICD-10-CM | POA: Diagnosis not present

## 2022-07-17 DIAGNOSIS — I2510 Atherosclerotic heart disease of native coronary artery without angina pectoris: Secondary | ICD-10-CM | POA: Diagnosis not present

## 2022-07-17 DIAGNOSIS — M6281 Muscle weakness (generalized): Secondary | ICD-10-CM | POA: Diagnosis not present

## 2022-07-17 DIAGNOSIS — J479 Bronchiectasis, uncomplicated: Secondary | ICD-10-CM | POA: Diagnosis not present

## 2022-07-17 DIAGNOSIS — E782 Mixed hyperlipidemia: Secondary | ICD-10-CM | POA: Diagnosis not present

## 2022-07-17 DIAGNOSIS — M150 Primary generalized (osteo)arthritis: Secondary | ICD-10-CM | POA: Diagnosis not present

## 2022-07-17 DIAGNOSIS — M169 Osteoarthritis of hip, unspecified: Secondary | ICD-10-CM | POA: Diagnosis not present

## 2022-07-17 DIAGNOSIS — Z96641 Presence of right artificial hip joint: Secondary | ICD-10-CM | POA: Diagnosis not present

## 2022-07-17 DIAGNOSIS — E039 Hypothyroidism, unspecified: Secondary | ICD-10-CM | POA: Diagnosis not present

## 2022-07-17 DIAGNOSIS — E785 Hyperlipidemia, unspecified: Secondary | ICD-10-CM | POA: Diagnosis not present

## 2022-07-17 DIAGNOSIS — E038 Other specified hypothyroidism: Secondary | ICD-10-CM | POA: Diagnosis not present

## 2022-07-17 DIAGNOSIS — F418 Other specified anxiety disorders: Secondary | ICD-10-CM | POA: Diagnosis not present

## 2022-07-17 DIAGNOSIS — I10 Essential (primary) hypertension: Secondary | ICD-10-CM | POA: Diagnosis not present

## 2022-07-18 ENCOUNTER — Other Ambulatory Visit: Payer: Self-pay

## 2022-07-18 ENCOUNTER — Encounter (HOSPITAL_COMMUNITY): Payer: Self-pay | Admitting: Emergency Medicine

## 2022-07-18 ENCOUNTER — Emergency Department (HOSPITAL_COMMUNITY)
Admission: EM | Admit: 2022-07-18 | Discharge: 2022-07-19 | Disposition: A | Discharge status: Home / Self Care | Payer: PPO | Attending: Emergency Medicine | Admitting: Emergency Medicine

## 2022-07-18 DIAGNOSIS — I82451 Acute embolism and thrombosis of right peroneal vein: Secondary | ICD-10-CM

## 2022-07-18 DIAGNOSIS — Z96641 Presence of right artificial hip joint: Secondary | ICD-10-CM | POA: Insufficient documentation

## 2022-07-18 DIAGNOSIS — R918 Other nonspecific abnormal finding of lung field: Secondary | ICD-10-CM | POA: Diagnosis not present

## 2022-07-18 DIAGNOSIS — E039 Hypothyroidism, unspecified: Secondary | ICD-10-CM | POA: Diagnosis not present

## 2022-07-18 DIAGNOSIS — I341 Nonrheumatic mitral (valve) prolapse: Secondary | ICD-10-CM | POA: Diagnosis not present

## 2022-07-18 DIAGNOSIS — M79604 Pain in right leg: Secondary | ICD-10-CM | POA: Diagnosis not present

## 2022-07-18 DIAGNOSIS — Z4789 Encounter for other orthopedic aftercare: Secondary | ICD-10-CM | POA: Diagnosis not present

## 2022-07-18 DIAGNOSIS — M7989 Other specified soft tissue disorders: Secondary | ICD-10-CM | POA: Insufficient documentation

## 2022-07-18 DIAGNOSIS — H9191 Unspecified hearing loss, right ear: Secondary | ICD-10-CM | POA: Diagnosis not present

## 2022-07-18 DIAGNOSIS — E119 Type 2 diabetes mellitus without complications: Secondary | ICD-10-CM | POA: Diagnosis not present

## 2022-07-18 DIAGNOSIS — I110 Hypertensive heart disease with heart failure: Secondary | ICD-10-CM | POA: Diagnosis not present

## 2022-07-18 DIAGNOSIS — I5030 Unspecified diastolic (congestive) heart failure: Secondary | ICD-10-CM | POA: Diagnosis not present

## 2022-07-18 DIAGNOSIS — J8410 Pulmonary fibrosis, unspecified: Secondary | ICD-10-CM | POA: Diagnosis not present

## 2022-07-18 NOTE — ED Triage Notes (Signed)
Pt c/o right foot swelling with redness and pain since yesterday. Pt is currently taking lovenox.

## 2022-07-19 ENCOUNTER — Telehealth (HOSPITAL_COMMUNITY): Payer: Self-pay | Admitting: Emergency Medicine

## 2022-07-19 ENCOUNTER — Ambulatory Visit (HOSPITAL_COMMUNITY)
Admission: RE | Admit: 2022-07-19 | Discharge: 2022-07-19 | Disposition: A | Discharge status: Home / Self Care | Payer: PPO | Source: Ambulatory Visit | Attending: Emergency Medicine | Admitting: Emergency Medicine

## 2022-07-19 DIAGNOSIS — M79604 Pain in right leg: Secondary | ICD-10-CM

## 2022-07-19 DIAGNOSIS — I82551 Chronic embolism and thrombosis of right peroneal vein: Secondary | ICD-10-CM | POA: Diagnosis not present

## 2022-07-19 LAB — BASIC METABOLIC PANEL
Anion gap: 6 (ref 5–15)
BUN: 8 mg/dL (ref 8–23)
CO2: 27 mmol/L (ref 22–32)
Calcium: 9 mg/dL (ref 8.9–10.3)
Chloride: 104 mmol/L (ref 98–111)
Creatinine, Ser: 0.75 mg/dL (ref 0.44–1.00)
GFR, Estimated: >60 mL/min (ref >60)
Glucose, Bld: 110 mg/dL — ABNORMAL HIGH (ref 70–99)
Potassium: 3.1 mmol/L — ABNORMAL LOW (ref 3.5–5.1)
Sodium: 137 mmol/L (ref 135–145)

## 2022-07-19 LAB — CBC
HCT: 29.5 % — ABNORMAL LOW (ref 36.0–46.0)
Hemoglobin: 9.6 g/dL — ABNORMAL LOW (ref 12.0–15.0)
MCH: 30 pg (ref 26.0–34.0)
MCHC: 32.5 g/dL (ref 30.0–36.0)
MCV: 92.2 fL (ref 80.0–100.0)
Platelets: 377 10*3/uL (ref 150–400)
RBC: 3.2 MIL/uL — ABNORMAL LOW (ref 3.87–5.11)
RDW: 13.8 % (ref 11.5–15.5)
WBC: 7.2 10*3/uL (ref 4.0–10.5)
nRBC: 0 % (ref 0.0–0.2)

## 2022-07-19 MED ORDER — APIXABAN (ELIQUIS) VTE STARTER PACK (10MG AND 5MG): ORAL_TABLET | ORAL | 0 refills | Status: DC

## 2022-07-19 MED ORDER — ENOXAPARIN SODIUM 40 MG/0.4ML IJ SOSY
40.0000 mg | PREFILLED_SYRINGE | INTRAMUSCULAR | Status: AC
  Administered 2022-07-19: 40 mg via SUBCUTANEOUS
  Filled 2022-07-19: qty 0.4

## 2022-07-19 NOTE — ED Provider Notes (Signed)
Christy Richardson is a 87 y.o. female patient who was presents to the emergency department for ultrasound that was ordered last night.  He presented last night for swelling and pain in the right leg.  She had an ultrasound today which revealed a nonocclusive DVT in the right peroneal vein.  Physical exam of the right leg does reveal swelling.  No significant calf tenderness.  2+ dorsalis pedis pulse felt on the right.  I notified the patient of the imaging results.  She is currently on Lovenox nightly.  I will have her stop that and place her on oral anticoagulants.  She will follow-up with her primary care doctor or general surgeon who performed a hip replacement.  All questions or concerns addressed.  I discussed red flag symptoms with her and family at the bedside.  Advised him to pick up a compression stocking to help with some of the swelling.  She is safe for discharge at this time.  Narrative & Impression  CLINICAL DATA:  Right lower extremity pain and swelling.   EXAM: Right LOWER EXTREMITY VENOUS DOPPLER ULTRASOUND   TECHNIQUE: Gray-scale sonography with graded compression, as well as color Doppler and duplex ultrasound were performed to evaluate the lower extremity deep venous systems from the level of the common femoral vein and including the common femoral, femoral, profunda femoral, popliteal and calf veins including the posterior tibial, peroneal and gastrocnemius veins when visible. The superficial great saphenous vein was also interrogated. Spectral Doppler was utilized to evaluate flow at rest and with distal augmentation maneuvers in the common femoral, femoral and popliteal veins.   COMPARISON:  Apr 15, 2018.   FINDINGS: Contralateral Common Femoral Vein: Respiratory phasicity is normal and symmetric with the symptomatic side. No evidence of thrombus. Normal compressibility.   Common Femoral Vein: No evidence of thrombus. Normal compressibility, respiratory phasicity and  response to augmentation.   Saphenofemoral Junction: No evidence of thrombus. Normal compressibility and flow on color Doppler imaging.   Profunda Femoral Vein: No evidence of thrombus. Normal compressibility and flow on color Doppler imaging.   Femoral Vein: No evidence of thrombus. Normal compressibility, respiratory phasicity and response to augmentation.   Popliteal Vein: No evidence of thrombus. Normal compressibility, respiratory phasicity and response to augmentation.   Calf Veins: Normal compressibility and flow seen within the right posterior tibial vein. Nonocclusive thrombus is noted in the right peroneal vein.   Superficial Great Saphenous Vein: No evidence of thrombus. Normal compressibility.   Venous Reflux:  None.   Other Findings:  None.   IMPRESSION: Nonocclusive deep venous thrombosis seen in right peroneal vein. These results will be called to the ordering clinician or representative by the Radiologist Assistant, and communication documented in the PACS or zVision Dashboard.     Electronically Signed   By: Marijo Conception M.D.   On: 07/19/2022 13:30      Hendricks Limes, PA-C 07/19/22 1414    Wyvonnia Dusky, MD 07/19/22 1432

## 2022-07-19 NOTE — ED Provider Notes (Signed)
[facility] EMERGENCY DEPARTMENT Provider Note   CSN: [id number] Arrival date & time: 07/18/22  2233     History  Chief Complaint  Patient presents with   Foot Pain    Christy Richardson is a 87 y.o. female.  Patient presents to the emergency department with complaints of pain and swelling of her right leg.  Patient first noticed the swelling yesterday but tonight the swelling got much worse.  Patient recently had right hip replacement.  She was in rehab for a few days and then went home several days ago.  She is currently taking 40 mg of Lovenox a day.  She took her dose tonight at around 1030.  She is not experiencing any chest pain or shortness of breath.       Home Medications Prior to Admission medications   Medication Sig Start Date End Date Taking? Authorizing Provider  acetaminophen (TYLENOL 8 HOUR ARTHRITIS PAIN) 650 MG CR tablet Take 650 mg by mouth every 8 (eight) hours as needed for pain.    [provider]  albuterol (PROVENTIL) (2.5 MG/3ML) 0.083% nebulizer solution Take 3 mLs (2.5 mg total) by nebulization every 6 (six) hours as needed for wheezing or shortness of breath. Patient taking differently: Take 2.5 mg by nebulization every 6 (six) hours as needed for wheezing or shortness of breath. Normally will use 2-3 times daily 03/22/22   Icard, Leory Plowman L, DO  albuterol (VENTOLIN HFA) 108 (90 Base) MCG/ACT inhaler Inhale 2 puffs into the lungs every 6 (six) hours as needed for wheezing or shortness of breath. 06/29/22   Hunsucker, Bonna Gains, MD  amLODipine (NORVASC) 5 MG tablet Take one tablet (5 mg) by mouth daily at dinner time Patient taking differently: Take 5 mg by mouth every evening. 04/25/22   Minna Merritts, MD  brimonidine (ALPHAGAN) 0.2 % ophthalmic solution Place 1 drop into both eyes 2 (two) times daily. Place 1 drop into both eyes 2 (two) times daily. 12/21/15   [provider]  carvedilol (COREG) 12.5 MG tablet Take HALF tablet (6.25 mg) in  MORNING, take WHOLE tablet (12.5 mg) in EVENING. 01/31/22   Gollan, Kathlene November, MD  cetirizine (ZYRTEC) 10 MG tablet Take 10 mg by mouth every morning. 03/30/11   Parrett, Fonnie Mu, NP  Cholecalciferol (VITAMIN D3 PO) Take 1 tablet by mouth daily at 6 (six) AM.    [provider]  cyclobenzaprine (FLEXERIL) 10 MG tablet Take 10 mg by mouth 3 (three) times daily as needed for muscle spasms.    [provider]  docusate sodium (COLACE) 100 MG capsule Take 1 capsule (100 mg total) by mouth 2 (two) times daily. 07/12/22   Duanne Guess, PA-C  dorzolamide (TRUSOPT) 2 % ophthalmic solution Place 1 drop into both eyes 2 (two) times daily. Place 1 drop into both eyes 2 (two) times daily. 11/03/15   [provider]  enoxaparin (LOVENOX) 40 MG/0.4ML injection Inject 0.4 mLs (40 mg total) into the skin daily for 14 days. 07/13/22 07/27/22  Duanne Guess, PA-C  guaifenesin (MUCUS RELIEF) 400 MG TABS tablet Take 1,200 mg by mouth every morning.    [provider]  hydrALAZINE (APRESOLINE) 25 MG tablet Take 2 tablets (50 mg total) by mouth 3 (three) times daily as needed (Take as needed for systolic blood pressure > 170.). 06/24/19   Theora Gianotti, NP  HYDROcodone-acetaminophen (NORCO/VICODIN) 5-325 MG tablet Take 1-2 tablets by mouth every 4 (four) hours as needed for  moderate pain (pain score 4-6). 07/13/22   Duanne Guess, PA-C  levothyroxine (SYNTHROID) 75 MCG tablet Take 75 mcg by mouth daily before breakfast. 03/19/22   [provider]  LORazepam (ATIVAN) 0.5 MG tablet Take 0.5 mg by mouth 2 (two) times daily as needed for anxiety (tremors).    [provider]  losartan (COZAAR) 100 MG tablet TAKE ONE TABLET BY MOUTH ONCE DAILY. Patient taking differently: Take 100 mg by mouth every evening. TAKE ONE TABLET BY MOUTH ONCE DAILY. 10/10/21   Minna Merritts, MD  Multiple Vitamins-Minerals (MULTIVITAMIN WITH MINERALS) tablet Take 1 tablet by mouth 3  (three) times a week.    [provider]  pantoprazole (PROTONIX) 40 MG tablet Take 1 tablet (40 mg total) by mouth daily before breakfast. 06/20/18   Gatha Mayer, MD  pantoprazole (PROTONIX) 40 MG tablet Take 1 tablet by mouth every morning. 11/15/21   [provider]  Polyethyl Glycol-Propyl Glycol (SYSTANE OP) Apply 1 drop to eye as needed.    [provider]  pravastatin (PRAVACHOL) 40 MG tablet Take 1 tablet (40 mg total) by mouth at bedtime. 03/30/11   Parrett, Fonnie Mu, NP  Probiotic Product (PROBIOTIC PO) Take 1 tablet by mouth daily at 6 (six) AM.    [provider]  sertraline (ZOLOFT) 100 MG tablet Take 2 tablets by mouth every morning. 12/19/21   [provider]  sodium chloride (OCEAN) 0.65 % nasal spray Place 2 sprays into both nostrils daily at 6 (six) AM. Use as needed 03/30/11   Parrett, Tammy S, NP  Tiotropium Bromide-Olodaterol (STIOLTO RESPIMAT) 2.5-2.5 MCG/ACT AERS Inhale 2 puffs into the lungs daily. Patient taking differently: Inhale 2 puffs into the lungs every morning. 03/05/22   Icard, Octavio Graves, DO  traMADol (ULTRAM) 50 MG tablet Take 1 tablet (50 mg total) by mouth every 6 (six) hours as needed. 07/13/22   Duanne Guess, PA-C  traZODone (DESYREL) 50 MG tablet Take 50 mg by mouth at bedtime.    [provider]  triamcinolone (NASACORT) 55 MCG/ACT AERO nasal inhaler Place 1 spray into the nose at bedtime.    [provider]      Allergies    Prednisone, Tramadol, Adhesive [tape], Penicillins, and Sulfonamide derivatives    Review of Systems   Review of Systems  Physical Exam Updated Vital Signs BP 125/68   Pulse 67   Temp 98.7 F (37.1 C) (Oral)   Resp 16   Ht '5\' 8"'$  (1.727 m)   Wt 72.6 kg   SpO2 91%   BMI 24.33 kg/m  Physical Exam Vitals and nursing note reviewed.  Constitutional:      General: She is not in acute distress.    Appearance: She is well-developed.  HENT:     Head: Normocephalic  and atraumatic.     Mouth/Throat:     Mouth: Mucous membranes are moist.  Eyes:     General: Vision grossly intact. Gaze aligned appropriately.     Extraocular Movements: Extraocular movements intact.     Conjunctiva/sclera: Conjunctivae normal.  Cardiovascular:     Rate and Rhythm: Normal rate and regular rhythm.     Pulses:          Dorsalis pedis pulses are detected w/ Doppler on the right side.     Heart sounds: Normal heart sounds, S1 normal and S2 normal. No murmur heard.    No friction rub. No gallop.  Pulmonary:  Effort: Pulmonary effort is normal. No respiratory distress.     Breath sounds: Normal breath sounds.  Abdominal:     General: Bowel sounds are normal.     Palpations: Abdomen is soft.     Tenderness: There is no abdominal tenderness. There is no guarding or rebound.     Hernia: No hernia is present.  Musculoskeletal:        General: No swelling.     Cervical back: Full passive range of motion without pain, normal range of motion and neck supple. No spinous process tenderness or muscular tenderness. Normal range of motion.     Right lower leg: Swelling present. No edema.     Left lower leg: No edema.     Right foot: Swelling present.  Skin:    General: Skin is warm and dry.     Capillary Refill: Capillary refill takes less than 2 seconds.     Findings: No ecchymosis, erythema, rash or wound.     Comments: No discoloration of the leg, surgical site appears to be healing well, no signs of infection.  Neurological:     General: No focal deficit present.     Mental Status: She is alert and oriented to person, place, and time.     GCS: GCS eye subscore is 4. GCS verbal subscore is 5. GCS motor subscore is 6.     Cranial Nerves: Cranial nerves 2-12 are intact.     Sensory: Sensation is intact.     Motor: Motor function is intact.     Coordination: Coordination is intact.  Psychiatric:        Attention and Perception: Attention normal.        Mood and Affect:  Mood normal.        Speech: Speech normal.        Behavior: Behavior normal.     ED Results / Procedures / Treatments   Labs (all labs ordered are listed, but only abnormal results are displayed) Labs Reviewed  CBC - Abnormal; Notable for the following components:      Result Value   RBC 3.20 (*)    Hemoglobin 9.6 (*)    HCT 29.5 (*)    All other components within normal limits  BASIC METABOLIC PANEL - Abnormal; Notable for the following components:   Potassium 3.1 (*)    Glucose, Bld 110 (*)    All other components within normal limits    EKG None  Radiology No results found.  Procedures Procedures    Medications Ordered in ED Medications  enoxaparin (LOVENOX) injection 40 mg (has no administration in time range)    ED Course/ Medical Decision Making/ A&P                           Medical Decision Making Amount and/or Complexity of Data Reviewed Labs: ordered.  Risk Prescription drug management.   Patient has a significant amount of swelling in the right leg, status post right hip replacement. Suspect post-operative DVT. No signs of infection. Pulses present. Patient is on prophylactic Lovenox, received '40mg'$  [place] at 10:30PM. Given an additional '40mg'$ , will discharge to return in AM for venous duplex (not available at night). No signs/symptoms of PE.        Final Clinical Impression(s) / ED Diagnoses Final diagnoses:  Right leg pain    Rx / DC Orders ED Discharge Orders          Ordered  US Venous Img Lower Unilateral Right        07/19/22 0143              Orpah Greek, MD 07/19/22 0151

## 2022-07-19 NOTE — Discharge Instructions (Addendum)
Please stop taking Lovenox and start taking Eliquis as prescribed.  Please follow-up with your primary care doctor or general surgeon.  Return to the emerged part for any worsening symptoms.  Please pick up a compression stocking.

## 2022-07-19 NOTE — Telephone Encounter (Signed)
Patient was discharged longer than 2 hours ago and unable to send as an ED visit.  I will send her Eliquis via telephone visit.

## 2022-08-02 DIAGNOSIS — I82409 Acute embolism and thrombosis of unspecified deep veins of unspecified lower extremity: Secondary | ICD-10-CM | POA: Diagnosis not present

## 2022-08-02 DIAGNOSIS — Z6822 Body mass index (BMI) 22.0-22.9, adult: Secondary | ICD-10-CM | POA: Diagnosis not present

## 2022-08-02 DIAGNOSIS — J479 Bronchiectasis, uncomplicated: Secondary | ICD-10-CM | POA: Diagnosis not present

## 2022-08-02 DIAGNOSIS — I10 Essential (primary) hypertension: Secondary | ICD-10-CM | POA: Diagnosis not present

## 2022-08-02 DIAGNOSIS — M169 Osteoarthritis of hip, unspecified: Secondary | ICD-10-CM | POA: Diagnosis not present

## 2022-08-07 DIAGNOSIS — E119 Type 2 diabetes mellitus without complications: Secondary | ICD-10-CM | POA: Diagnosis not present

## 2022-08-07 DIAGNOSIS — Z96641 Presence of right artificial hip joint: Secondary | ICD-10-CM | POA: Diagnosis not present

## 2022-08-07 DIAGNOSIS — I5030 Unspecified diastolic (congestive) heart failure: Secondary | ICD-10-CM | POA: Diagnosis not present

## 2022-08-07 DIAGNOSIS — I110 Hypertensive heart disease with heart failure: Secondary | ICD-10-CM | POA: Diagnosis not present

## 2022-08-07 DIAGNOSIS — Z4789 Encounter for other orthopedic aftercare: Secondary | ICD-10-CM | POA: Diagnosis not present

## 2022-08-07 DIAGNOSIS — J8410 Pulmonary fibrosis, unspecified: Secondary | ICD-10-CM | POA: Diagnosis not present

## 2022-08-07 DIAGNOSIS — H9191 Unspecified hearing loss, right ear: Secondary | ICD-10-CM | POA: Diagnosis not present

## 2022-08-07 DIAGNOSIS — R918 Other nonspecific abnormal finding of lung field: Secondary | ICD-10-CM | POA: Diagnosis not present

## 2022-08-07 DIAGNOSIS — I341 Nonrheumatic mitral (valve) prolapse: Secondary | ICD-10-CM | POA: Diagnosis not present

## 2022-08-07 DIAGNOSIS — E039 Hypothyroidism, unspecified: Secondary | ICD-10-CM | POA: Diagnosis not present

## 2022-08-10 DIAGNOSIS — N189 Chronic kidney disease, unspecified: Secondary | ICD-10-CM | POA: Diagnosis not present

## 2022-08-10 DIAGNOSIS — R5383 Other fatigue: Secondary | ICD-10-CM | POA: Diagnosis not present

## 2022-08-15 DIAGNOSIS — M1611 Unilateral primary osteoarthritis, right hip: Secondary | ICD-10-CM | POA: Diagnosis not present

## 2022-08-15 DIAGNOSIS — Z96641 Presence of right artificial hip joint: Secondary | ICD-10-CM | POA: Diagnosis not present

## 2022-08-29 DIAGNOSIS — R093 Abnormal sputum: Secondary | ICD-10-CM | POA: Diagnosis not present

## 2022-08-29 DIAGNOSIS — R0600 Dyspnea, unspecified: Secondary | ICD-10-CM | POA: Diagnosis not present

## 2022-08-29 DIAGNOSIS — R0981 Nasal congestion: Secondary | ICD-10-CM | POA: Diagnosis not present

## 2022-08-29 DIAGNOSIS — J479 Bronchiectasis, uncomplicated: Secondary | ICD-10-CM | POA: Diagnosis not present

## 2022-09-05 DIAGNOSIS — J479 Bronchiectasis, uncomplicated: Secondary | ICD-10-CM | POA: Diagnosis not present

## 2022-09-05 DIAGNOSIS — R093 Abnormal sputum: Secondary | ICD-10-CM | POA: Diagnosis not present

## 2022-09-05 DIAGNOSIS — G2581 Restless legs syndrome: Secondary | ICD-10-CM | POA: Diagnosis not present

## 2022-09-05 DIAGNOSIS — R0600 Dyspnea, unspecified: Secondary | ICD-10-CM | POA: Diagnosis not present

## 2022-09-05 DIAGNOSIS — R0981 Nasal congestion: Secondary | ICD-10-CM | POA: Diagnosis not present

## 2022-09-11 ENCOUNTER — Other Ambulatory Visit (HOSPITAL_COMMUNITY): Payer: Self-pay | Admitting: Orthopedic Surgery

## 2022-09-11 DIAGNOSIS — M4807 Spinal stenosis, lumbosacral region: Secondary | ICD-10-CM

## 2022-09-14 ENCOUNTER — Telehealth: Payer: Self-pay | Admitting: Cardiovascular Disease

## 2022-09-14 MED ORDER — AMLODIPINE BESYLATE 5 MG PO TABS: 5.0000 mg | ORAL_TABLET | Freq: Two times a day (BID) | ORAL | 3 refills | Status: DC

## 2022-09-14 NOTE — Telephone Encounter (Signed)
Minna Merritts, MD  Sent: Fri September 14, 2022  3:20 PM  To: Emily Filbert, RN          Message  Would recommend she increase amlodipine  Continue 5 mg at 6 PM, take additional 5 mg nightly/for bed  Continue the half dose Coreg 6.2 5 in the morning, whole 12.5 mg at 6 PM  Thx  TGollan

## 2022-09-14 NOTE — Telephone Encounter (Signed)
Pt c/o BP issue: STAT if pt c/o blurred vision, one-sided weakness or slurred speech  1. What are your last 5 BP readings?  10/13: 206/101  2. Are you having any other symptoms (ex. Dizziness, headache, blurred vision, passed out)?  No   3. What is your BP issue?   Patient's daughter states the patient's BP has been elevated. Systolic is remaining in the 200's. She states the patient has been taking an additional 1/2 tablet of carvedilol and BP remains elevated.

## 2022-09-14 NOTE — Telephone Encounter (Signed)
Patient's daughter Joelene Millin informed and verbalized understanding of plan.

## 2022-09-14 NOTE — Addendum Note (Signed)
Addended by: Merlene Laughter on: 09/14/2022 03:53 PM   Modules accepted: Orders

## 2022-09-14 NOTE — Telephone Encounter (Signed)
Reports elevated blood pressure in the mornings. Medications reviewed. Per daughter, patient reports having a bad reaction to hydralazine with dizziness and now refuses to take hydralazine. Denies dizziness, chest pain, sob, headache or blurred vision. Reports right total hip replacement in August and continues to have a lot of pain. Reports does not rest well at night due to pain. Checks blood pressures 3 times per day. Morning BP check is prior to medications and is always higher, afternoon check is after medications, and takes last BP at bedtime. BP this morning 206/101 and yesterday 200/98 Reports afternoon and bedtime BP's are much better Advised to retry hydralazine as needed per instructions and start at 25 mg dose. Offered appointment at [facility] for next week but declined Gave 1st available appointment to see Sharolyn Douglas 09/27/22 '@2'$ :20 pm [place] office Verbalized understanding of plan

## 2022-09-17 ENCOUNTER — Other Ambulatory Visit (HOSPITAL_COMMUNITY): Payer: Self-pay | Admitting: Orthopedic Surgery

## 2022-09-17 ENCOUNTER — Other Ambulatory Visit: Payer: Self-pay | Admitting: Orthopedic Surgery

## 2022-09-17 DIAGNOSIS — Z96641 Presence of right artificial hip joint: Secondary | ICD-10-CM

## 2022-09-19 ENCOUNTER — Encounter: Payer: Self-pay | Admitting: *Deleted

## 2022-09-27 ENCOUNTER — Encounter: Payer: Self-pay | Admitting: Nurse Practitioner

## 2022-09-27 ENCOUNTER — Ambulatory Visit: Payer: PPO | Attending: Nurse Practitioner | Admitting: Nurse Practitioner

## 2022-09-27 VITALS — BP 130/78 | HR 83 | Ht 68.0 in | Wt 148.0 lb

## 2022-09-27 DIAGNOSIS — I10 Essential (primary) hypertension: Secondary | ICD-10-CM

## 2022-09-27 MED ORDER — HYDRALAZINE HCL 50 MG PO TABS: 25.0000 mg | ORAL_TABLET | Freq: Every day | ORAL | 0 refills | Status: AC | PRN

## 2022-09-27 MED ORDER — CARVEDILOL 12.5 MG PO TABS: 12.5000 mg | ORAL_TABLET | Freq: Two times a day (BID) | ORAL | 0 refills | Status: DC

## 2022-09-27 NOTE — Progress Notes (Signed)
Office Visit    Patient Name: Christy Richardson Date of Encounter: 09/27/2022  Primary Care Provider:  Celene Squibb, MD Primary Cardiologist:  Ida Rogue, MD  Chief Complaint    87 y/o ? w/ a h/o labile HTN, anxiety, bronchiectasis, chronic dyspnea, GERD, goiter, HL, and diast dysfxn, who presents for f/u related to HTN w/ recently elevated BPs.  Past Medical History    Past Medical History:  Diagnosis Date   Adenomatous polyp of colon 03/2007   Allergic rhinitis    Anxiety    a.) on BZO (lorazepam) PRN   Arthritis    Blood transfusion 1974   Bronchiectasis ([place])    Cancer ([place])    left arm   Deaf    in right 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 Mild TR; b.) TTE 07/16/2019: EF 60-65%, triv AR, mild MR/TR, G1DD.   Diet-controlled type 2 diabetes mellitus ([place])    Diverticulosis of colon (without mention of hemorrhage)    DOE (dyspnea on exertion)    Dyspnea    Fatigue    Fecal incontinence    GERD (gastroesophageal reflux disease)    Glaucoma    Goiter    right side   Heart murmur    History of stress test    a. 01/2014 Lexi MV: no ischemia/infarct.   Hyperkalemia    Hyperlipidemia    Hypertension    a. 11/2018 Renal artery duplex: No RAS.    Hypothyroidism    IBS (irritable bowel syndrome)    Migraine    Mitral valve prolapse    a. 01/2014 Echo: no MR/MVP noted.   Neck mass    right side/ no airway problems/ per pt has had since 2005 (goiter)   Nonspecific abnormal electrocardiogram (ECG) (EKG)    Palpitations    a. 01/2014 CardioNet: wore for 9 days (rash). Sinus tach - 100's. No SVT/AF.   Pneumonia    Polyp, sigmoid colon    Post-operative nausea and vomiting    Pulmonary fibrosis (H[facility])    Pulmonary nodules    Recurrent UTI    Tachycardia    Tremor    Past Surgical History:  Procedure Laterality Date   ABDOMINAL HYSTERECTOMY     CARDIOVASCULAR  STRESS TEST  09/19/2011   No scintigraphic evidence of inducible myocardial ischemia. Pharmacological stress test without chest pain or EKG changes for ischemia.   COLONOSCOPY     ESOPHAGOGASTRODUODENOSCOPY     MASTOIDECTOMY     rt ear/ as a teenager   OOPHORECTOMY     SKIN CANCER EXCISION Left    arm   TOTAL HIP ARTHROPLASTY  5/10   left   TOTAL HIP ARTHROPLASTY Right 07/10/2022   Procedure: TOTAL HIP ARTHROPLASTY ANTERIOR APPROACH;  Surgeon: Hessie Knows, MD;  Location: [facility] ORS;  Service: Orthopedics;  Laterality: Right;   TRANSTHORACIC ECHOCARDIOGRAM  09/19/2011   EF >87%, stage 1 diastolic dysfunction, mild tricuspid valve regurg    Allergies  Allergies  Allergen Reactions   Prednisone     REACTION: Increase eye pressure with gloucoma. Broke out in rash after injection per Dr. Percell Miller   Tramadol Itching   Adhesive [Tape]     Causes blisters/irritates skin-surgical tape   Penicillins     REACTION: Rash   Sulfonamide Derivatives     REACTION: Rash - not sure  History of Present Illness    87 year old female with a history of labile hypertension, mitral valve prolapse, anxiety, bronchiectasis, chronic dyspnea, GERD, goiter, hyperlipidemia, diastolic dysfunction.  She previously underwent evaluation for dyspnea with stress testing (nonischemic) and echo (normal EF, grade 1 diastolic dysfunction) in March 2015.  In the setting of bronchiectasis and chronic dyspnea, she has been followed closely by pulmonology.  Hypertension has been somewhat difficult to manage in the setting of variable blood pressure recordings at home along with orthostatic symptoms.  She reported the blood pressures are running higher in the mornings at her May 2023 visit and also reported frequent awakening at night up to 4-5 times but declined sleep apnea work-up.  In August of this year, she underwent total right hip arthroplasty.  She required reevaluation in the emergency department within a week of  discharge secondary to right lower extremity swelling and discomfort.  She was found to have a nonocclusive right peroneal deep venous thrombosis and she was placed on Eliquis.  She just recently completed a course of therapy.  Ms. Strahm contacted our office on October 13, secondary to hypertension with a blood pressure of 206/101.  She was advised to increase her amlodipine to 5 mg twice daily and continue 6.25 mg of carvedilol in the morning with 12.5 mg in the evening.  Today, she indicates that blood pressures worsening in the morning have been running in the 180-200 range for several weeks.  She typically checks her blood pressure first thing in the morning, prior to taking any of her morning medications.  When her blood pressure is high (greater than 170 mmHg), instead of taking her morning carvedilol, she will take hydralazine 12.5 mg.  Blood pressures normalized throughout the day.  At upon 20 5 PM, she takes losartan 100 mg and amlodipine 5 mg.  Prior to bedtime, blood pressures are typically around 031 systolic.  At that point, she takes carvedilol 12.5 mg and another amlodipine 5 mg.  We discussed her medications and timing of medications at length, along with her use of as needed hydralazine and its potential creation of rebound hypertension.  Her daughter is present with her today.  Fortunately, Ms. Serda has not been having any chest pain or shortness of breath.  She denies dyspnea, PND, orthopnea, palpitations, dizziness, syncope, edema, or early satiety.  Home Medications    Current Outpatient Medications  Medication Sig Dispense Refill   acetaminophen (TYLENOL 8 HOUR ARTHRITIS PAIN) 650 MG CR tablet Take 650 mg by mouth every 8 (eight) hours as needed for pain.     albuterol (PROVENTIL) (2.5 MG/3ML) 0.083% nebulizer solution Take 3 mLs (2.5 mg total) by nebulization every 6 (six) hours as needed for wheezing or shortness of breath. 75 mL 12   albuterol (VENTOLIN HFA) 108 (90 Base)  MCG/ACT inhaler Inhale 2 puffs into the lungs every 6 (six) hours as needed for wheezing or shortness of breath. 8 g 6   amLODipine (NORVASC) 5 MG tablet Take 1 tablet (5 mg total) by mouth 2 (two) times daily. 6:00 pm and at bedtime 180 tablet 3   brimonidine (ALPHAGAN) 0.2 % ophthalmic solution Place 1 drop into both eyes 2 (two) times daily. Place 1 drop into both eyes 2 (two) times daily.     carvedilol (COREG) 12.5 MG tablet Take 12.5 mg by mouth every evening.     cetirizine (ZYRTEC) 10 MG tablet Take 10 mg by mouth every morning.     Cholecalciferol (VITAMIN D3  PO) Take 1 tablet by mouth daily at 6 (six) AM.     cyclobenzaprine (FLEXERIL) 10 MG tablet Take 10 mg by mouth 3 (three) times daily as needed for muscle spasms.     docusate sodium (COLACE) 100 MG capsule Take 1 capsule (100 mg total) by mouth 2 (two) times daily. 10 capsule 0   dorzolamide (TRUSOPT) 2 % ophthalmic solution Place 1 drop into both eyes 2 (two) times daily. Place 1 drop into both eyes 2 (two) times daily.     guaifenesin (MUCUS RELIEF) 400 MG TABS tablet Take 1,200 mg by mouth every morning.     hydrALAZINE (APRESOLINE) 50 MG tablet Take 25 mg by mouth daily as needed. As needed when BP is over 170     HYDROcodone-acetaminophen (NORCO/VICODIN) 5-325 MG tablet Take 1-2 tablets by mouth every 4 (four) hours as needed for moderate pain (pain score 4-6). 30 tablet 0   levothyroxine (SYNTHROID) 75 MCG tablet Take 75 mcg by mouth daily before breakfast.     LORazepam (ATIVAN) 0.5 MG tablet Take 0.5 mg by mouth 2 (two) times daily as needed for anxiety (tremors).     losartan (COZAAR) 100 MG tablet TAKE ONE TABLET BY MOUTH ONCE DAILY. 90 tablet 2   Multiple Vitamins-Minerals (MULTIVITAMIN WITH MINERALS) tablet Take 1 tablet by mouth 3 (three) times a week.     pantoprazole (PROTONIX) 40 MG tablet Take 1 tablet (40 mg total) by mouth daily before breakfast. 90 tablet 3   Polyethyl Glycol-Propyl Glycol (SYSTANE OP) Apply 1 drop  to eye as needed.     pravastatin (PRAVACHOL) 40 MG tablet Take 1 tablet (40 mg total) by mouth at bedtime.     Probiotic Product (PROBIOTIC PO) Take 1 tablet by mouth daily at 6 (six) AM.     rOPINIRole (REQUIP) 0.5 MG tablet Take 0.5 mg by mouth at bedtime.     sertraline (ZOLOFT) 100 MG tablet Take 2 tablets by mouth every morning.     sodium chloride (OCEAN) 0.65 % nasal spray Place 2 sprays into both nostrils daily at 6 (six) AM. Use as needed     Tiotropium Bromide-Olodaterol (STIOLTO RESPIMAT) 2.5-2.5 MCG/ACT AERS Inhale 2 puffs into the lungs daily. (Patient taking differently: Inhale 2 puffs into the lungs every morning.) 4 g 5   traMADol (ULTRAM) 50 MG tablet Take 1 tablet (50 mg total) by mouth every 6 (six) hours as needed. 30 tablet 0   traZODone (DESYREL) 50 MG tablet Take 50 mg by mouth at bedtime.     triamcinolone (NASACORT) 55 MCG/ACT AERO nasal inhaler Place 1 spray into the nose at bedtime.     No current facility-administered medications for this visit.     Review of Systems    Elevated blood pressure as outlined above.  She denies chest pain, palpitations, dyspnea, PND, orthopnea, dizziness, syncope, edema, or early satiety.  All other systems reviewed and are otherwise negative except as noted above.    Physical Exam    VS:  BP 130/78 (BP Location: Left Arm, Patient Position: Sitting, Cuff Size: Normal)   Pulse 83   Ht '5\' 8"'$  (1.727 m)   Wt 148 lb (67.1 kg)   SpO2 95%   BMI 22.50 kg/m  , BMI Body mass index is 22.5 kg/m.     GEN: Well nourished, well developed, in no acute distress. HEENT: normal. Neck: Supple, no JVD, carotid bruits, or masses. Cardiac: RRR, no murmurs, rubs, or gallops. No clubbing, cyanosis,  edema.  Radials/PT 2+ and equal bilaterally.  Respiratory:  Respirations regular and unlabored, clear to auscultation bilaterally. GI: Soft, nontender, nondistended, BS + x 4. MS: no deformity or atrophy. Skin: warm and dry, no rash. Neuro:  Strength  and sensation are intact. Psych: Normal affect.  Accessory Clinical Findings    ECG personally reviewed by me today -regular sinus rhythm, 83- no acute changes.  Lab Results  Component Value Date   WBC 7.2 07/19/2022   HGB 9.6 (L) 07/19/2022   HCT 29.5 (L) 07/19/2022   MCV 92.2 07/19/2022   PLT 377 07/19/2022   Lab Results  Component Value Date   CREATININE 0.75 07/19/2022   BUN 8 07/19/2022   NA 137 07/19/2022   K 3.1 (L) 07/19/2022   CL 104 07/19/2022   CO2 27 07/19/2022   Lab Results  Component Value Date   ALT 17 03/20/2018   AST 24 03/20/2018   ALKPHOS 55 03/20/2018   BILITOT 1.3 (H) 03/20/2018   Lab Results  Component Value Date   CHOL 201 (H) 03/01/2009   HDL 47 03/01/2009   LDLCALC 116 (H) 03/01/2009   TRIG 189 (H) 03/01/2009   CHOLHDL 4.3 Ratio 03/01/2009     Assessment & Plan    1.  Essential hypertension: Patient with a long history of difficult to control and labile hypertension.  She has recently been experiencing blood pressures in the 190s first thing in the morning, prior to taking morning medications.  When this occurs, instead of taking her prescribed morning dose of carvedilol, she instead takes 12.5 mg of hydralazine with improvement in blood pressure throughout the day with and subsequent rebound hypertension prior to bedtime.  I have recommended that she hold off on taking her blood pressure until after she is taking her morning medications and have increased her morning carvedilol to 12.5 mg twice daily.  She will also take amlodipine 5 mg at that time.  She prefers to take losartan 100 mg at approximately 5 PM, and then will take carvedilol 12.5 mg and amlodipine 5 mg at bedtime.  I advised that if systolic pressures are greater than 170 mmHg within 90-120 minutes after her morning medications, she may take hydralazine 12.5 mg as needed.  We will likely have room to titrate carvedilol further if necessary with a goal to avoid use of prn hydralazine as  much as possible.  Long discussion with patient and daughter today.  Her daughter, who arranges her medications, verbalized understanding.  2.  Disposition: Patient to continue to follow blood pressures at home over the next few weeks and we will see her back in clinic in 1 month or sooner if necessary.   Murray Hodgkins, NP 09/27/2022, 3:15 PM

## 2022-09-27 NOTE — Patient Instructions (Signed)
Medication Instructions:   -Take Carvedilol 12.5 mg tablet twice daily by mouth. -Continue Amlodipine 5 mg tablet by mouth twice daily. -Continue Hydralazine 50 mg 1/2 (25 mg) tablet by mouth daily as needed.  *If you need a refill on your cardiac medications before your next appointment, please call your pharmacy*   Lab Work:  NONE  If you have labs (blood work) drawn today and your tests are completely normal, you will receive your results only by: [place] (if you have MyChart) OR A paper copy in the mail If you have any lab test that is abnormal or we need to change your treatment, we will call you to review the results.   Testing/Procedures:  NONE   Follow-Up: At [facility], you and your health needs are our priority.  As part of our continuing mission to provide you with exceptional heart care, we have created designated Provider Care Teams.  These Care Teams include your primary Cardiologist (physician) and Advanced Practice Providers (APPs -  Physician Assistants and Nurse Practitioners) who all work together to provide you with the care you need, when you need it.  We recommend signing up for the patient portal called "MyChart".  Sign up information is provided on this After Visit Summary.  MyChart is used to connect with patients for Virtual Visits (Telemedicine).  Patients are able to view lab/test results, encounter notes, upcoming appointments, etc.  Non-urgent messages can be sent to your provider as well.   To learn more about what you can do with MyChart, go to [email or web address].    Your next appointment:   1 month(s)  The format for your next appointment:   In Person  Provider:   Ida Rogue, MD  Important Information About Sugar

## 2022-10-08 ENCOUNTER — Telehealth: Payer: Self-pay | Admitting: Pulmonary Disease

## 2022-10-08 NOTE — Telephone Encounter (Signed)
Pt called office to make an appt for follow up but before she wanted to schedule the appt, she wanted to know if she could switch from Dr. Valeta Harms to Dr. Lake Bells.  Pt is a former pt of Dr. Lake Bells and wants to go back to him for care.  Dr. Valeta Harms, please advise if you are okay with pt switching and Dr. Lake Bells, please advise if you are okay taking back pt as one of yours.

## 2022-10-11 NOTE — Telephone Encounter (Signed)
Called and spoke with pt and have scheduled her a new pt appt with BQ. Nothing further needed.

## 2022-10-17 ENCOUNTER — Telehealth: Payer: Self-pay | Admitting: Cardiovascular Disease

## 2022-10-17 DIAGNOSIS — H401132 Primary open-angle glaucoma, bilateral, moderate stage: Secondary | ICD-10-CM | POA: Diagnosis not present

## 2022-10-17 MED ORDER — AMLODIPINE BESYLATE 5 MG PO TABS: 5.0000 mg | ORAL_TABLET | Freq: Two times a day (BID) | ORAL | 2 refills | Status: DC

## 2022-10-17 NOTE — Telephone Encounter (Signed)
*  STAT* If patient is at the pharmacy, call can be transferred to refill team.   1. Which medications need to be refilled? (please list name of each medication and dose if known)   amLODipine (NORVASC) 5 MG tablet   2. Which pharmacy/location (including street and city if local pharmacy) is medication to be sent to?  [place]   3. Do they need a 30 day or 90 day supply? 90 day  Daughter stated the patient is almost out of this medication and noted patient's dosage was increased and the pharmacy will need the new prescription.

## 2022-10-23 DIAGNOSIS — H903 Sensorineural hearing loss, bilateral: Secondary | ICD-10-CM | POA: Diagnosis not present

## 2022-10-23 DIAGNOSIS — H7011 Chronic mastoiditis, right ear: Secondary | ICD-10-CM | POA: Diagnosis not present

## 2022-10-23 DIAGNOSIS — R300 Dysuria: Secondary | ICD-10-CM | POA: Diagnosis not present

## 2022-10-23 DIAGNOSIS — R1030 Lower abdominal pain, unspecified: Secondary | ICD-10-CM | POA: Diagnosis not present

## 2022-10-23 DIAGNOSIS — H6980 Other specified disorders of Eustachian tube, unspecified ear: Secondary | ICD-10-CM | POA: Diagnosis not present

## 2022-10-23 DIAGNOSIS — N390 Urinary tract infection, site not specified: Secondary | ICD-10-CM | POA: Diagnosis not present

## 2022-10-30 ENCOUNTER — Encounter: Payer: Self-pay | Admitting: Pulmonary Disease

## 2022-10-30 ENCOUNTER — Ambulatory Visit: Payer: PPO | Admitting: Pulmonary Disease

## 2022-10-30 ENCOUNTER — Telehealth: Payer: Self-pay

## 2022-10-30 VITALS — BP 122/70 | HR 78 | Ht 68.0 in | Wt 152.0 lb

## 2022-10-30 DIAGNOSIS — R0609 Other forms of dyspnea: Secondary | ICD-10-CM | POA: Diagnosis not present

## 2022-10-30 DIAGNOSIS — J479 Bronchiectasis, uncomplicated: Secondary | ICD-10-CM | POA: Diagnosis not present

## 2022-10-30 NOTE — Progress Notes (Signed)
Subjective:    Patient ID: KARLEY PHO, female    DOB: September 09, 1935, 87 y.o.   MRN: [id number]  Synopsis: This is a very pleasant female with bronchiectasis presumably from childhood pertussis.  She switched care from [facility] to [facility] (Pulmonary) in 05/2012.  Her simple spirometry in 2013 was 78% predicted.  HPI Chief Complaint  Patient presents with   Follow-up   Tajana had a hip replacement on the right. She is going some rehab at home, but she is struggling a little with mobility.  She has some rehab people coming out to the house some, but she hasn't had an easy time with the rehab. She says that her breathing is still a problem for her.  She says that her throat is sore, her voice is "groggy" and she has hoarseness.  She's noticed that for several years.  She continue so tuse her albuterol nebulizer and the Stiolto. She has been on the [employer] for 3 years.  She's had some flares of her bronchiectasis and in October she required antibiotic administration.  She still coughs up mucus nearly every day.  She has to clear her throat a lot, nearly every day.   She hasn't had any unexplained weight loss, no fever or chills except for the flare in September.  She has night sweats from time to time.   She has heartburn/acid reflux and takes protonix and a probiotic.    Past Medical History:  Diagnosis Date   Adenomatous polyp of colon 03/2007   Allergic rhinitis    Anxiety    a.) on BZO (lorazepam) PRN   Arthritis    Blood transfusion 1974   Bronchiectasis ([place])    Cancer ([place])    left arm   Deaf    in right 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 Mild TR; b.) TTE 07/16/2019: EF 60-65%, triv AR, mild MR/TR, G1DD.   Diet-controlled type 2 diabetes mellitus ([place])    Diverticulosis of colon (without mention of hemorrhage)    DOE (dyspnea on exertion)    Dyspnea    Fatigue     Fecal incontinence    GERD (gastroesophageal reflux disease)    Glaucoma    Goiter    right side   Heart murmur    History of stress test    a. 01/2014 Lexi MV: no ischemia/infarct.   Hyperkalemia    Hyperlipidemia    Hypertension    a. 11/2018 Renal artery duplex: No RAS.    Hypothyroidism    IBS (irritable bowel syndrome)    Migraine    Mitral valve prolapse    a. 01/2014 Echo: no MR/MVP noted.   Neck mass    right side/ no airway problems/ per pt has had since 2005 (goiter)   Nonspecific abnormal electrocardiogram (ECG) (EKG)    Palpitations    a. 01/2014 CardioNet: wore for 9 days (rash). Sinus tach - 100's. No SVT/AF.   Pneumonia    Polyp, sigmoid colon    Post-operative nausea and vomiting    Pulmonary fibrosis (HCC)    Pulmonary nodules    Recurrent UTI    Tachycardia    Tremor      Review of Systems  Constitutional:  Negative for chills, fatigue and fever.  HENT:  Negative for postnasal drip, rhinorrhea and sinus pressure.   Respiratory:  Positive for shortness  of breath. Negative for cough and wheezing.   Cardiovascular:  Negative for chest pain, palpitations and leg swelling.      Objective:   Physical Exam  Vitals:   10/30/22 1337  BP: 122/70  Pulse: 78  SpO2: 97%  Weight: 152 lb (68.9 kg)  Height: '5\' 8"'$  (1.727 m)   RA  Gen: well appearing HENT: OP clear, neck supple PULM: CTA B, normal effort  CV: RRR, no mgr GI: BS+, soft, nontender Derm: no cyanosis or rash Psyche: normal mood and affect    PFT: 05/2012 simple spirometry> > Ratio 63%, FEV1 1.98L (78% pred) 05/05/2015 simple spirometry> ratio 60%, FEV1 1.79 L (77% predicted). March 2019 ratio 61%, FEV1 1.92 L 86% predicted, FVC 3.2 L 106% predicted, total lung capacity 5.5 L 100% predicted, DLCO 14.5 mL 51% predicted  Chest imaging: 11/2012 CT chest [facility] >> bilateral bronchiectasis, scattered nodules no greater than 2m in size March 2015 simple spirometry > ratio 62%, FEV1 1.87 L (79%  predicted) February 2015 chest x-ray images reviewed: Elevated right hemidiaphragm, some chronic bronchitic changes, otherwise clear March 2019 chest x-ray images independently reviewed, hyperinflation noted, small pulmonary nodule stable. 08/2021 CT lungs> stable nodule RUL and bronchiectasis     Assessment & Plan:    [place]  Dyspnea on exertion  Discussion: DArlettapresents today with a chief complaint of shortness of breath which I believe has been her chief complaint for nearly every visit to the pulmonary clinic.  Difficult to say at this time that there is clear evidence of progression of her bronchiectasis, I believe that her shortness of breath is due to physical deconditioning.  It sounds as if she has had a rough year this year as she had some difficulty after a hip replacement.  I think it is worthwhile to repeat lung function testing and sample her mucus.  Given the frequency of exacerbations of her bronchiectasis it is possible that her bronchiectasis has progressed and she now has worsening lung function.  I also think she may benefit from daily azithromycin.  However, I like to see results of the sputum culture testing first.  Plan: Bronchiectasis with recurrent exacerbation: The key to bronchiectasis management is taking a balanced approach to your health: exercise, nutrition, mucociliary clearance, and attention to airway infection.  Exercise: Stay physically active: exercise multiple days per week with endurance and resistance activities  Nutrition: Drink at least 64oz of water a day unless directed otherwise by a physician Your diet should be well balanced with a heavy focus on unprocessed fruits and vegetables Make it a goal to eat 1.2g/kg body weight of lean protein a day.  For example, if you weigh 50 kg then try to eat 60 grams of fish, egg white protein, lentils in a day  Mucociliary clearance: This term refers to the physical  effort you make routinely to help clear mucus out of your lungs Use a flutter valve 4-5 breaths, 4-5 times per day As a reminder, most patients with bronchiectasis produce mucus every day Keep taking Stiolto for now as you are doing and use albuterol as you are doing  Attention to airway infection: Please provide [place] a sample of your mucus: We will test for bacterial, fungal, AFB culture If you experience increasing chest congestion, mucus production, change in mucus color, shortness of breath, or fever I need to know right away  We will check a lung function test  We will see you back in 2  months to go over the results of the sputum microbiology testing and consider daily azithromycin at that point.     Current Outpatient Medications:    acetaminophen (TYLENOL 8 HOUR ARTHRITIS PAIN) 650 MG CR tablet, Take 650 mg by mouth every 8 (eight) hours as needed for pain., Disp: , Rfl:    albuterol (PROVENTIL) (2.5 MG/3ML) 0.083% nebulizer solution, Take 3 mLs (2.5 mg total) by nebulization every 6 (six) hours as needed for wheezing or shortness of breath., Disp: 75 mL, Rfl: 12   albuterol (VENTOLIN HFA) 108 (90 Base) MCG/ACT inhaler, Inhale 2 puffs into the lungs every 6 (six) hours as needed for wheezing or shortness of breath., Disp: 8 g, Rfl: 6   amLODipine (NORVASC) 5 MG tablet, Take 1 tablet (5 mg total) by mouth 2 (two) times daily., Disp: 180 tablet, Rfl: 2   brimonidine (ALPHAGAN) 0.2 % ophthalmic solution, Place 1 drop into both eyes 2 (two) times daily. Place 1 drop into both eyes 2 (two) times daily., Disp: , Rfl:    carvedilol (COREG) 12.5 MG tablet, Take 1 tablet (12.5 mg total) by mouth 2 (two) times daily with a meal., Disp: 60 tablet, Rfl: 0   cetirizine (ZYRTEC) 10 MG tablet, Take 10 mg by mouth every morning., Disp: , Rfl:    Cholecalciferol (VITAMIN D3 PO), Take 1 tablet by mouth daily at 6 (six) AM., Disp: , Rfl:    cyclobenzaprine (FLEXERIL) 10 MG tablet, Take 10 mg by mouth  3 (three) times daily as needed for muscle spasms., Disp: , Rfl:    docusate sodium (COLACE) 100 MG capsule, Take 1 capsule (100 mg total) by mouth 2 (two) times daily., Disp: 10 capsule, Rfl: 0   dorzolamide (TRUSOPT) 2 % ophthalmic solution, Place 1 drop into both eyes 2 (two) times daily. Place 1 drop into both eyes 2 (two) times daily., Disp: , Rfl:    guaifenesin (MUCUS RELIEF) 400 MG TABS tablet, Take 1,200 mg by mouth every morning., Disp: , Rfl:    hydrALAZINE (APRESOLINE) 50 MG tablet, Take 0.5 tablets (25 mg total) by mouth daily as needed., Disp: 15 tablet, Rfl: 0   HYDROcodone-acetaminophen (NORCO/VICODIN) 5-325 MG tablet, Take 1-2 tablets by mouth every 4 (four) hours as needed for moderate pain (pain score 4-6)., Disp: 30 tablet, Rfl: 0   levothyroxine (SYNTHROID) 75 MCG tablet, Take 75 mcg by mouth daily before breakfast., Disp: , Rfl:    LORazepam (ATIVAN) 0.5 MG tablet, Take 0.5 mg by mouth 2 (two) times daily as needed for anxiety (tremors)., Disp: , Rfl:    losartan (COZAAR) 100 MG tablet, TAKE ONE TABLET BY MOUTH ONCE DAILY., Disp: 90 tablet, Rfl: 2   Multiple Vitamins-Minerals (MULTIVITAMIN WITH MINERALS) tablet, Take 1 tablet by mouth 3 (three) times a week., Disp: , Rfl:    pantoprazole (PROTONIX) 40 MG tablet, Take 1 tablet (40 mg total) by mouth daily before breakfast., Disp: 90 tablet, Rfl: 3   Polyethyl Glycol-Propyl Glycol (SYSTANE OP), Apply 1 drop to eye as needed., Disp: , Rfl:    pravastatin (PRAVACHOL) 40 MG tablet, Take 1 tablet (40 mg total) by mouth at bedtime., Disp: , Rfl:    Probiotic Product (PROBIOTIC PO), Take 1 tablet by mouth daily at 6 (six) AM., Disp: , Rfl:    rOPINIRole (REQUIP) 0.5 MG tablet, Take 0.5 mg by mouth at bedtime., Disp: , Rfl:    sertraline (ZOLOFT) 100 MG tablet, Take 2 tablets by mouth every morning., Disp: , Rfl:  sodium chloride (OCEAN) 0.65 % nasal spray, Place 2 sprays into both nostrils daily at 6 (six) AM. Use as needed, Disp: ,  Rfl:    Tiotropium Bromide-Olodaterol (STIOLTO RESPIMAT) 2.5-2.5 MCG/ACT AERS, Inhale 2 puffs into the lungs daily. (Patient taking differently: Inhale 2 puffs into the lungs every morning.), Disp: 4 g, Rfl: 5   traMADol (ULTRAM) 50 MG tablet, Take 1 tablet (50 mg total) by mouth every 6 (six) hours as needed., Disp: 30 tablet, Rfl: 0   traZODone (DESYREL) 50 MG tablet, Take 50 mg by mouth at bedtime., Disp: , Rfl:    triamcinolone (NASACORT) 55 MCG/ACT AERO nasal inhaler, Place 1 spray into the nose at bedtime., Disp: , Rfl:

## 2022-10-30 NOTE — Telephone Encounter (Signed)
Patient brought in [employer] patient assistance forms, we filled them out and faxed them on 10/30/22 BR, LPN

## 2022-10-30 NOTE — Patient Instructions (Signed)
Bronchiectasis with recurrent exacerbation: The key to bronchiectasis management is taking a balanced approach to your health: exercise, nutrition, mucociliary clearance, and attention to airway infection.  Exercise: Stay physically active: exercise multiple days per week with endurance and resistance activities  Nutrition: Drink at least 64oz of water a day unless directed otherwise by a physician Your diet should be well balanced with a heavy focus on unprocessed fruits and vegetables Make it a goal to eat 1.2g/kg body weight of lean protein a day.  For example, if you weigh 50 kg then try to eat 60 grams of fish, egg white protein, lentils in a day  Mucociliary clearance: This term refers to the physical effort you make routinely to help clear mucus out of your lungs Use a flutter valve 4-5 breaths, 4-5 times per day As a reminder, most patients with bronchiectasis produce mucus every day Keep taking Stiolto for now as you are doing and use albuterol as you are doing  Attention to airway infection: Please provide [place] with a sample of your mucus: We will test for bacterial, fungal, AFB culture If you experience increasing chest congestion, mucus production, change in mucus color, shortness of breath, or fever I need to know right away  We will check a lung function test  We will see you back in 2 months to go over the results of the sputum microbiology testing and consider daily azithromycin at that point.

## 2022-11-01 NOTE — Telephone Encounter (Signed)
Received confirmation fax. Will send a copy to scan and the original back to patient via mail.

## 2022-11-06 ENCOUNTER — Other Ambulatory Visit: Payer: Self-pay | Admitting: Nurse Practitioner

## 2022-11-06 NOTE — Telephone Encounter (Signed)
Rx refill sent to pharmacy. 

## 2022-11-19 ENCOUNTER — Ambulatory Visit: Payer: PPO | Admitting: Cardiovascular Disease

## 2022-11-19 ENCOUNTER — Telehealth: Payer: Self-pay | Admitting: Pulmonary Disease

## 2022-11-20 NOTE — Telephone Encounter (Signed)
Called and left voicemail of Dr Lake Bells recommendations told her to call office back with any questions. Nothing further needed

## 2022-11-20 NOTE — Telephone Encounter (Signed)
Called patient and she states that she is having a hard time getting up as much sputum that is needed. She is asking for more instructions.  Mucinex '600mg'$  2 times a day sir?  And I did tell her that she is still needing to do the PFTs as soon as she can for you to look over  Please advise sir

## 2022-11-29 ENCOUNTER — Ambulatory Visit (HOSPITAL_COMMUNITY)
Admission: RE | Admit: 2022-11-29 | Discharge: 2022-11-29 | Disposition: A | Discharge status: Home / Self Care | Payer: PPO | Source: Ambulatory Visit | Attending: Orthopedic Surgery | Admitting: Orthopedic Surgery

## 2022-11-29 ENCOUNTER — Ambulatory Visit (HOSPITAL_COMMUNITY): Payer: PPO

## 2022-11-29 DIAGNOSIS — Z96641 Presence of right artificial hip joint: Secondary | ICD-10-CM

## 2022-11-29 DIAGNOSIS — M25552 Pain in left hip: Secondary | ICD-10-CM | POA: Diagnosis not present

## 2022-11-29 DIAGNOSIS — M4807 Spinal stenosis, lumbosacral region: Secondary | ICD-10-CM | POA: Insufficient documentation

## 2022-11-29 DIAGNOSIS — M5117 Intervertebral disc disorders with radiculopathy, lumbosacral region: Secondary | ICD-10-CM | POA: Diagnosis not present

## 2022-11-29 DIAGNOSIS — M48061 Spinal stenosis, lumbar region without neurogenic claudication: Secondary | ICD-10-CM | POA: Diagnosis not present

## 2022-11-29 DIAGNOSIS — M545 Low back pain, unspecified: Secondary | ICD-10-CM | POA: Diagnosis not present

## 2022-11-29 DIAGNOSIS — M79605 Pain in left leg: Secondary | ICD-10-CM | POA: Diagnosis not present

## 2022-12-04 ENCOUNTER — Ambulatory Visit (HOSPITAL_COMMUNITY): Payer: PPO

## 2022-12-08 ENCOUNTER — Ambulatory Visit
Admission: EM | Admit: 2022-12-08 | Discharge: 2022-12-08 | Disposition: A | Discharge status: Home / Self Care | Payer: PPO | Attending: Nurse Practitioner | Admitting: Nurse Practitioner

## 2022-12-08 ENCOUNTER — Encounter: Payer: Self-pay | Admitting: Emergency Medicine

## 2022-12-08 DIAGNOSIS — R399 Unspecified symptoms and signs involving the genitourinary system: Secondary | ICD-10-CM | POA: Insufficient documentation

## 2022-12-08 LAB — POCT URINALYSIS DIP (MANUAL ENTRY)
Bilirubin, UA: NEGATIVE
Glucose, UA: NEGATIVE mg/dL
Ketones, POC UA: NEGATIVE mg/dL
Nitrite, UA: NEGATIVE
Protein Ur, POC: 100 mg/dL — AB
Spec Grav, UA: 1.02 (ref 1.010–1.025)
Urobilinogen, UA: 0.2 E.U./dL
pH, UA: 6.5 (ref 5.0–8.0)

## 2022-12-08 MED ORDER — CEPHALEXIN 500 MG PO CAPS: 500.0000 mg | ORAL_CAPSULE | Freq: Two times a day (BID) | ORAL | 0 refills | Status: DC

## 2022-12-08 MED ORDER — CEPHALEXIN 500 MG PO CAPS
500.0000 mg | ORAL_CAPSULE | Freq: Two times a day (BID) | ORAL | 0 refills | Status: DC
Start: 2022-12-08 — End: 2022-12-08

## 2022-12-08 NOTE — ED Provider Notes (Signed)
[facility]    CSN: [id number] Arrival date & time: 12/08/22  1244      History   Chief Complaint No chief complaint on file.   HPI Christy Richardson is a 88 y.o. female.   The history is provided by the patient.   The patient presents for complaints of urinary frequency, hesitancy, and dysuria that started earlier today.  She denies fever, chills, low back pain, flank pain, hematuria, decreased urine stream, or vaginal symptoms.  Patient reports that she was diagnosed with a urinary tract infection approximately 6 weeks ago.  She states when she did follow-up with her pulmonologist, he made her stop the antibiotic, Macrobid, that she was currently taken, and told her that she did not need the medication.  Patient states that she took 1 Macrobid earlier this morning when her symptoms started again.  Past Medical History:  Diagnosis Date   Adenomatous polyp of colon 03/2007   Allergic rhinitis    Anxiety    a.) on BZO (lorazepam) PRN   Arthritis    Blood transfusion 1974   Bronchiectasis ([place])    Cancer ([place])    left arm   Deaf    in right 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 Mild TR; b.) TTE 07/16/2019: EF 60-65%, triv AR, mild MR/TR, G1DD.   Diet-controlled type 2 diabetes mellitus ([place])    Diverticulosis of colon (without mention of hemorrhage)    DOE (dyspnea on exertion)    Dyspnea    Fatigue    Fecal incontinence    GERD (gastroesophageal reflux disease)    Glaucoma    Goiter    right side   Heart murmur    History of stress test    a. 01/2014 Lexi MV: no ischemia/infarct.   Hyperkalemia    Hyperlipidemia    Hypertension    a. 11/2018 Renal artery duplex: No RAS.    Hypothyroidism    IBS (irritable bowel syndrome)    Migraine    Mitral valve prolapse    a. 01/2014 Echo: no MR/MVP noted.   Neck mass    right side/ no airway problems/ per pt has had  since 2005 (goiter)   Nonspecific abnormal electrocardiogram (ECG) (EKG)    Palpitations    a. 01/2014 CardioNet: wore for 9 days (rash). Sinus tach - 100's. No SVT/AF.   Pneumonia    Polyp, sigmoid colon    Post-operative nausea and vomiting    Pulmonary fibrosis (H[facility])    Pulmonary nodules    Recurrent UTI    Tachycardia    Tremor     Patient Active Problem List   Diagnosis Date Noted   S/P total right hip arthroplasty 07/11/2022   Status post total hip replacement, right 07/10/2022   Preop respiratory exam 06/21/2022   Sinus tachycardia 02/04/2018   Hyperlipidemia LDL goal <70 08/16/2015   Left ventricular diastolic dysfunction, NYHA class 1 03/13/2014   Chest pain 01/30/2014   Tremor, essential 05/07/2013   Fecal incontinence 09/30/2012   Pulmonary nodules 05/15/2012   Dyspnea 05/27/2011   Bronchiectasis without acute exacerbation ([place]) 09/26/2009   OSTEOPENIA 03/17/2009   COLONIC POLYPS, ADENOMATOUS, HX OF 09/27/2008   BACK PAIN 09/01/2008   LIPOMA 06/01/2008   UTI'S, RECURRENT 06/10/2007   Hypothyroidism 01/07/2007   Hyperlipidemia 01/07/2007   Anxiety 01/07/2007   DEPRESSION 01/07/2007  MIGRAINE HEADACHE 01/07/2007   GLAUCOMA NEC 01/07/2007   DECREASED HEARING 01/07/2007   Essential hypertension 01/07/2007   Allergic rhinitis 01/07/2007   GERD 01/07/2007   IBS 01/07/2007   OSTEOARTHRITIS 01/07/2007    Past Surgical History:  Procedure Laterality Date   ABDOMINAL HYSTERECTOMY     CARDIOVASCULAR STRESS TEST  09/19/2011   No scintigraphic evidence of inducible myocardial ischemia. Pharmacological stress test without chest pain or EKG changes for ischemia.   COLONOSCOPY     ESOPHAGOGASTRODUODENOSCOPY     MASTOIDECTOMY     rt ear/ as a teenager   OOPHORECTOMY     SKIN CANCER EXCISION Left    arm   TOTAL HIP ARTHROPLASTY  5/10   left   TOTAL HIP ARTHROPLASTY Right 07/10/2022   Procedure: TOTAL HIP ARTHROPLASTY ANTERIOR APPROACH;  Surgeon: Hessie Knows, MD;   Location: [facility] ORS;  Service: Orthopedics;  Laterality: Right;   TRANSTHORACIC ECHOCARDIOGRAM  09/19/2011   EF >91%, stage 1 diastolic dysfunction, mild tricuspid valve regurg    OB History   No obstetric history on file.      Home Medications    Prior to Admission medications   Medication Sig Start Date End Date Taking? Authorizing Provider  acetaminophen (TYLENOL 8 HOUR ARTHRITIS PAIN) 650 MG CR tablet Take 650 mg by mouth every 8 (eight) hours as needed for pain.    [provider]  albuterol (PROVENTIL) (2.5 MG/3ML) 0.083% nebulizer solution Take 3 mLs (2.5 mg total) by nebulization every 6 (six) hours as needed for wheezing or shortness of breath. 03/22/22   Icard, Octavio Graves, DO  albuterol (VENTOLIN HFA) 108 (90 Base) MCG/ACT inhaler Inhale 2 puffs into the lungs every 6 (six) hours as needed for wheezing or shortness of breath. 06/29/22   Hunsucker, Bonna Gains, MD  amLODipine (NORVASC) 5 MG tablet Take 1 tablet (5 mg total) by mouth 2 (two) times daily. 10/17/22   Minna Merritts, MD  brimonidine (ALPHAGAN) 0.2 % ophthalmic solution Place 1 drop into both eyes 2 (two) times daily. Place 1 drop into both eyes 2 (two) times daily. 12/21/15   [provider]  carvedilol (COREG) 12.5 MG tablet Take 1 tablet (12.5 mg total) by mouth 2 (two) times daily with a meal. 11/06/22   Theora Gianotti, NP  cephALEXin (KEFLEX) 500 MG capsule Take 1 capsule (500 mg total) by mouth 2 (two) times daily. 12/08/22   Jeremi Losito-Warren, Alda Lea, NP  cetirizine (ZYRTEC) 10 MG tablet Take 10 mg by mouth every morning. 03/30/11   Parrett, Fonnie Mu, NP  Cholecalciferol (VITAMIN D3 PO) Take 1 tablet by mouth daily at 6 (six) AM.    [provider]  cyclobenzaprine (FLEXERIL) 10 MG tablet Take 10 mg by mouth 3 (three) times daily as needed for muscle spasms.    [provider]  docusate sodium (COLACE) 100 MG capsule Take 1 capsule (100 mg total) by mouth 2 (two) times daily.  07/12/22   Duanne Guess, PA-C  dorzolamide (TRUSOPT) 2 % ophthalmic solution Place 1 drop into both eyes 2 (two) times daily. Place 1 drop into both eyes 2 (two) times daily. 11/03/15   [provider]  guaifenesin (MUCUS RELIEF) 400 MG TABS tablet Take 1,200 mg by mouth every morning.    [provider]  hydrALAZINE (APRESOLINE) 50 MG tablet Take 0.5 tablets (25 mg total) by mouth daily as needed. 09/27/22   Theora Gianotti, NP  HYDROcodone-acetaminophen (NORCO/VICODIN) 5-325 MG tablet Take  1-2 tablets by mouth every 4 (four) hours as needed for moderate pain (pain score 4-6). 07/13/22   Duanne Guess, PA-C  levothyroxine (SYNTHROID) 75 MCG tablet Take 75 mcg by mouth daily before breakfast. 03/19/22   [provider]  LORazepam (ATIVAN) 0.5 MG tablet Take 0.5 mg by mouth 2 (two) times daily as needed for anxiety (tremors).    [provider]  losartan (COZAAR) 100 MG tablet TAKE ONE TABLET BY MOUTH ONCE DAILY. 10/10/21   Minna Merritts, MD  Multiple Vitamins-Minerals (MULTIVITAMIN WITH MINERALS) tablet Take 1 tablet by mouth 3 (three) times a week.    [provider]  pantoprazole (PROTONIX) 40 MG tablet Take 1 tablet (40 mg total) by mouth daily before breakfast. 06/20/18   Gatha Mayer, MD  Polyethyl Glycol-Propyl Glycol (SYSTANE OP) Apply 1 drop to eye as needed.    [provider]  pravastatin (PRAVACHOL) 40 MG tablet Take 1 tablet (40 mg total) by mouth at bedtime. 03/30/11   Parrett, Fonnie Mu, NP  Probiotic Product (PROBIOTIC PO) Take 1 tablet by mouth daily at 6 (six) AM.    [provider]  rOPINIRole (REQUIP) 0.5 MG tablet Take 0.5 mg by mouth at bedtime. 09/05/22   [provider]  sertraline (ZOLOFT) 100 MG tablet Take 2 tablets by mouth every morning. 12/19/21   [provider]  sodium chloride (OCEAN) 0.65 % nasal spray Place 2 sprays into both nostrils daily at 6 (six) AM. Use as needed  03/30/11   Parrett, Tammy S, NP  Tiotropium Bromide-Olodaterol (STIOLTO RESPIMAT) 2.5-2.5 MCG/ACT AERS Inhale 2 puffs into the lungs daily. Patient taking differently: Inhale 2 puffs into the lungs every morning. 03/05/22   Icard, Octavio Graves, DO  traMADol (ULTRAM) 50 MG tablet Take 1 tablet (50 mg total) by mouth every 6 (six) hours as needed. 07/13/22   Duanne Guess, PA-C  traZODone (DESYREL) 50 MG tablet Take 50 mg by mouth at bedtime.    [provider]  triamcinolone (NASACORT) 55 MCG/ACT AERO nasal inhaler Place 1 spray into the nose at bedtime.    [provider]    Family History Family History  Problem Relation Age of Onset   Diabetes Father    Throat cancer Mother    Coronary artery disease Mother    Prostate cancer Brother    Asthma Daughter    Scoliosis Daughter    Colon cancer Paternal Grandfather    Esophageal cancer Neg Hx    Rectal cancer Neg Hx    Stomach cancer Neg Hx     Social History Social History   Tobacco Use   Smoking status: Never    Passive exposure: Past   Smokeless tobacco: Never  Vaping Use   Vaping Use: Never used  Substance Use Topics   Alcohol use: Yes    Comment: rare   Drug use: No     Allergies   Prednisone, Tramadol, Adhesive [tape], Penicillins, and Sulfonamide derivatives   Review of Systems Review of Systems Per HPI  Physical Exam Triage Vital Signs ED Triage Vitals  Enc Vitals Group     BP 12/08/22 1307 118/70     Pulse Rate 12/08/22 1307 91     Resp 12/08/22 1307 18     Temp 12/08/22 1307 98.5 F (36.9 C)     Temp Source 12/08/22 1307 Oral     SpO2 12/08/22 1307 97 %     Weight --  Height --      Head Circumference --      Peak Flow --      Pain Score 12/08/22 1308 0     Pain Loc --      Pain Edu? --      Excl. in [place]? --    No data found.  Updated Vital Signs BP 118/70 (BP Location: Right Arm)   Pulse 91   Temp 98.5 F (36.9 C) (Oral)   Resp 18   SpO2 97%   Visual Acuity Right  Eye Distance:   Left Eye Distance:   Bilateral Distance:    Right Eye Near:   Left Eye Near:    Bilateral Near:     Physical Exam Vitals and nursing note reviewed.  Constitutional:      General: She is not in acute distress.    Appearance: Normal appearance.  HENT:     Head: Normocephalic.  Eyes:     Extraocular Movements: Extraocular movements intact.     Pupils: Pupils are equal, round, and reactive to light.  Cardiovascular:     Rate and Rhythm: Normal rate and regular rhythm.     Pulses: Normal pulses.     Heart sounds: Normal heart sounds.  Abdominal:     General: Bowel sounds are normal.     Palpations: Abdomen is soft.     Tenderness: There is no abdominal tenderness. There is no right CVA tenderness or left CVA tenderness.  Musculoskeletal:     Cervical back: Normal range of motion.  Lymphadenopathy:     Cervical: No cervical adenopathy.  Skin:    General: Skin is warm and dry.  Neurological:     General: No focal deficit present.     Mental Status: She is alert and oriented to person, place, and time.  Psychiatric:        Mood and Affect: Mood normal.        Behavior: Behavior normal.      UC Treatments / Results  Labs (all labs ordered are listed, but only abnormal results are displayed) Labs Reviewed  POCT URINALYSIS DIP (MANUAL ENTRY) - Abnormal; Notable for the following components:      Result Value   Clarity, UA cloudy (*)    Blood, UA large (*)    Protein Ur, POC =100 (*)    Leukocytes, UA Small (1+) (*)    All other components within normal limits  URINE CULTURE    EKG   Radiology No results found.  Procedures Procedures (including critical care time)  Medications Ordered in UC Medications - No data to display  Initial Impression / Assessment and Plan / UC Course  I have reviewed the triage vital signs and the nursing notes.  Pertinent labs & imaging results that were available during my care of the patient were reviewed by me  and considered in my medical decision making (see chart for details).  The patient is well-appearing, she is in no acute distress, vital signs are stable.  Urinalysis is positive for leukocytes, protein, and blood.  Urinalysis consistent with urinary tract infection, urine culture is pending.  Will treat patient in the interim with Keflex.  Patient reports a penicillin allergy but states that she can take the medication without side effects.  Supportive care recommendations were provided to the patient along with strict return precautions.  Patient verbalizes understanding.  All questions were answered.  Patient stable for discharge.   Final Clinical Impressions(s) / UC Diagnoses  Final diagnoses:  Urinary tract infection symptoms     Discharge Instructions      The urinalysis does show that you have a possible urinary tract infection.  A urine culture has been ordered to confirm.  If the urine culture is negative, you will be contacted and asked to stop the antibiotic. Take medication as prescribed. Increase fluids.  Try to drink at least 5-6 8 ounce glasses of water daily while symptoms persist. Recommend developing a toileting schedule to void at least every 2 hours.  May take over-the-counter Tylenol as needed for pain or discomfort. If you continue to experience urinary symptoms, and develop new symptoms such as fever, chills, severe kidney pain or flank pain, or other concerns, please go to the emergency department for further issue. As discussed, if your urine culture is negative and you are continuing to experience symptoms, please follow-up with your primary care physician for further evaluation. Follow-up as needed.      ED Prescriptions     Medication Sig Dispense Auth. Provider   cephALEXin (KEFLEX) 500 MG capsule  (Status: Discontinued) Take 1 capsule (500 mg total) by mouth 2 (two) times daily. 14 capsule Donis Pinder-Warren, Alda Lea, NP   cephALEXin (KEFLEX) 500 MG  capsule Take 1 capsule (500 mg total) by mouth 2 (two) times daily. 14 capsule Braylin Formby-Warren, Alda Lea, NP      PDMP not reviewed this encounter.   Tish Men, NP 12/08/22 1348

## 2022-12-08 NOTE — Discharge Instructions (Addendum)
The urinalysis does show that you have a possible urinary tract infection.  A urine culture has been ordered to confirm.  If the urine culture is negative, you will be contacted and asked to stop the antibiotic. Take medication as prescribed. Increase fluids.  Try to drink at least 5-6 8 ounce glasses of water daily while symptoms persist. Recommend developing a toileting schedule to void at least every 2 hours.  May take over-the-counter Tylenol as needed for pain or discomfort. If you continue to experience urinary symptoms, and develop new symptoms such as fever, chills, severe kidney pain or flank pain, or other concerns, please go to the emergency department for further issue. As discussed, if your urine culture is negative and you are continuing to experience symptoms, please follow-up with your primary care physician for further evaluation. Follow-up as needed.

## 2022-12-08 NOTE — ED Triage Notes (Signed)
Burning on urination that started this morning.  Urinary urgency and lower ABD pain.  Hx of UTI 6 weeks ago.

## 2022-12-10 LAB — URINE CULTURE: Culture: >=100000 — AB

## 2022-12-12 ENCOUNTER — Telehealth (HOSPITAL_COMMUNITY): Payer: Self-pay | Admitting: Emergency Medicine

## 2022-12-12 DIAGNOSIS — M1611 Unilateral primary osteoarthritis, right hip: Secondary | ICD-10-CM | POA: Diagnosis not present

## 2022-12-12 DIAGNOSIS — I824Z1 Acute embolism and thrombosis of unspecified deep veins of right distal lower extremity: Secondary | ICD-10-CM | POA: Diagnosis not present

## 2022-12-12 DIAGNOSIS — E119 Type 2 diabetes mellitus without complications: Secondary | ICD-10-CM | POA: Diagnosis not present

## 2022-12-12 DIAGNOSIS — M545 Low back pain, unspecified: Secondary | ICD-10-CM | POA: Diagnosis not present

## 2022-12-12 DIAGNOSIS — G8929 Other chronic pain: Secondary | ICD-10-CM | POA: Diagnosis not present

## 2022-12-12 DIAGNOSIS — Z96641 Presence of right artificial hip joint: Secondary | ICD-10-CM | POA: Diagnosis not present

## 2022-12-12 MED ORDER — FOSFOMYCIN TROMETHAMINE 3 G PO PACK: 3.0000 g | PACK | Freq: Once | ORAL | 0 refills | Status: AC

## 2022-12-18 DIAGNOSIS — M1611 Unilateral primary osteoarthritis, right hip: Secondary | ICD-10-CM | POA: Diagnosis not present

## 2022-12-18 DIAGNOSIS — R7303 Prediabetes: Secondary | ICD-10-CM | POA: Diagnosis not present

## 2022-12-18 DIAGNOSIS — E039 Hypothyroidism, unspecified: Secondary | ICD-10-CM | POA: Diagnosis not present

## 2022-12-18 DIAGNOSIS — N390 Urinary tract infection, site not specified: Secondary | ICD-10-CM | POA: Diagnosis not present

## 2022-12-18 DIAGNOSIS — M169 Osteoarthritis of hip, unspecified: Secondary | ICD-10-CM | POA: Diagnosis not present

## 2022-12-18 DIAGNOSIS — E785 Hyperlipidemia, unspecified: Secondary | ICD-10-CM | POA: Diagnosis not present

## 2022-12-18 DIAGNOSIS — R809 Proteinuria, unspecified: Secondary | ICD-10-CM | POA: Diagnosis not present

## 2022-12-24 DIAGNOSIS — R809 Proteinuria, unspecified: Secondary | ICD-10-CM | POA: Diagnosis not present

## 2022-12-24 DIAGNOSIS — M1611 Unilateral primary osteoarthritis, right hip: Secondary | ICD-10-CM | POA: Diagnosis not present

## 2022-12-24 DIAGNOSIS — E785 Hyperlipidemia, unspecified: Secondary | ICD-10-CM | POA: Diagnosis not present

## 2022-12-24 DIAGNOSIS — I10 Essential (primary) hypertension: Secondary | ICD-10-CM | POA: Diagnosis not present

## 2022-12-24 DIAGNOSIS — I129 Hypertensive chronic kidney disease with stage 1 through stage 4 chronic kidney disease, or unspecified chronic kidney disease: Secondary | ICD-10-CM | POA: Diagnosis not present

## 2022-12-24 DIAGNOSIS — Z0001 Encounter for general adult medical examination with abnormal findings: Secondary | ICD-10-CM | POA: Diagnosis not present

## 2022-12-24 DIAGNOSIS — K580 Irritable bowel syndrome with diarrhea: Secondary | ICD-10-CM | POA: Diagnosis not present

## 2022-12-24 DIAGNOSIS — R7303 Prediabetes: Secondary | ICD-10-CM | POA: Diagnosis not present

## 2022-12-24 DIAGNOSIS — K219 Gastro-esophageal reflux disease without esophagitis: Secondary | ICD-10-CM | POA: Diagnosis not present

## 2022-12-24 DIAGNOSIS — N189 Chronic kidney disease, unspecified: Secondary | ICD-10-CM | POA: Diagnosis not present

## 2022-12-24 DIAGNOSIS — F411 Generalized anxiety disorder: Secondary | ICD-10-CM | POA: Diagnosis not present

## 2022-12-24 DIAGNOSIS — J479 Bronchiectasis, uncomplicated: Secondary | ICD-10-CM | POA: Diagnosis not present

## 2023-01-08 NOTE — Therapy (Signed)
OUTPATIENT PHYSICAL THERAPY THORACOLUMBAR EVALUATION   Patient Name: Christy Richardson MRN: [id number] DOB:[birth year], 88 y.o., female Today's Date: 01/09/2023  END OF SESSION:  PT End of Session - 01/09/23 1426     Visit Number 1    Number of Visits 8    Date for PT Re-Evaluation 02/06/23    Authorization Type [employer]    Authorization Time Period no auth, no VL    PT Start Time 0230    PT Stop Time 0310    PT Time Calculation (min) 40 min    Activity Tolerance Patient tolerated treatment well    Behavior During Therapy Christy Richardson for tasks assessed/performed             Past Medical History:  Diagnosis Date   Adenomatous polyp of colon 03/2007   Allergic rhinitis    Anxiety    a.) on BZO (lorazepam) PRN   Arthritis    Blood transfusion 1974   Bronchiectasis ([place])    Cancer ([place])    left arm   Deaf    in right 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 Mild TR; b.) TTE 07/16/2019: EF 60-65%, triv AR, mild MR/TR, G1DD.   Diet-controlled type 2 diabetes mellitus ([place])    Diverticulosis of colon (without mention of hemorrhage)    DOE (dyspnea on exertion)    Dyspnea    Fatigue    Fecal incontinence    GERD (gastroesophageal reflux disease)    Glaucoma    Goiter    right side   Heart murmur    History of stress test    a. 01/2014 Lexi MV: no ischemia/infarct.   Hyperkalemia    Hyperlipidemia    Hypertension    a. 11/2018 Renal artery duplex: No RAS.    Hypothyroidism    IBS (irritable bowel syndrome)    Migraine    Mitral valve prolapse    a. 01/2014 Echo: no MR/MVP noted.   Neck mass    right side/ no airway problems/ per pt has had since 2005 (goiter)   Nonspecific abnormal electrocardiogram (ECG) (EKG)    Palpitations    a. 01/2014 CardioNet: wore for 9 days (rash). Sinus tach - 100's. No SVT/AF.   Pneumonia    Polyp, sigmoid colon    Post-operative nausea  and vomiting    Pulmonary fibrosis (H[facility])    Pulmonary nodules    Recurrent UTI    Tachycardia    Tremor    Past Surgical History:  Procedure Laterality Date   ABDOMINAL HYSTERECTOMY     CARDIOVASCULAR STRESS TEST  09/19/2011   No scintigraphic evidence of inducible myocardial ischemia. Pharmacological stress test without chest pain or EKG changes for ischemia.   COLONOSCOPY     ESOPHAGOGASTRODUODENOSCOPY     MASTOIDECTOMY     rt ear/ as a teenager   OOPHORECTOMY     SKIN CANCER EXCISION Left    arm   TOTAL HIP ARTHROPLASTY  5/10   left   TOTAL HIP ARTHROPLASTY Right 07/10/2022   Procedure: TOTAL HIP ARTHROPLASTY ANTERIOR APPROACH;  Surgeon: Hessie Knows, MD;  Location: [facility] ORS;  Service: Orthopedics;  Laterality: Right;   TRANSTHORACIC ECHOCARDIOGRAM  09/19/2011   EF >14%, stage 1 diastolic dysfunction, mild tricuspid valve regurg   Patient Active Problem List   Diagnosis Date Noted   S/P total right hip arthroplasty 07/11/2022  Status post total hip replacement, right 07/10/2022   Preop respiratory exam 06/21/2022   Sinus tachycardia 02/04/2018   Hyperlipidemia LDL goal <70 08/16/2015   Left ventricular diastolic dysfunction, NYHA class 1 03/13/2014   Chest pain 01/30/2014   Tremor, essential 05/07/2013   Fecal incontinence 09/30/2012   Pulmonary nodules 05/15/2012   Dyspnea 05/27/2011   Bronchiectasis without acute exacerbation ([place]) 09/26/2009   OSTEOPENIA 03/17/2009   COLONIC POLYPS, ADENOMATOUS, HX OF 09/27/2008   BACK PAIN 09/01/2008   LIPOMA 06/01/2008   UTI'S, RECURRENT 06/10/2007   Hypothyroidism 01/07/2007   Hyperlipidemia 01/07/2007   Anxiety 01/07/2007   DEPRESSION 01/07/2007   MIGRAINE HEADACHE 01/07/2007   GLAUCOMA NEC 01/07/2007   DECREASED HEARING 01/07/2007   Essential hypertension 01/07/2007   Allergic rhinitis 01/07/2007   GERD 01/07/2007   IBS 01/07/2007   OSTEOARTHRITIS 01/07/2007    PCP: Celene Squibb, MD  REFERRING PROVIDER: [place]; Christy Richardson  REFERRING DIAG: status post toatl replacment of right hip arthritis of right hip chronic bilateral low back pain without sciatica  Rationale for Evaluation and Treatment: Rehabilitation  THERAPY DIAG:  Low back pain, unspecified back pain laterality, unspecified chronicity, unspecified whether sciatica present  Pain in left leg  Difficulty in walking, not elsewhere classified  Pain in right hip  ONSET DATE: 07/10/2022   SUBJECTIVE:                                                                                                                                                                                           SUBJECTIVE STATEMENT: Had total hip right back in August.  After surgery BP dropped a lot so was unable to take much therapy or pain medication.  Went to rehab for 3 days; "It was a hell hole".  Came home; developed a blood clot behind her right knee.  Home health came out for about 2 weeks.   " Maybe I did not do as much as I should have".  Has had muscle spasms since surgery and MD gave her Gabapentin that has helped some but " I am in worse shape than I was before I had the surgery".  Reports difficulty with getting up out of a chair; getting in and out of a car.  Using a walker a little; mostly just to get up; sometimes "I almost fall"; also reports some swelling in her legs.   PERTINENT HISTORY:  S/P right THA 07/10/22 Left THA 2010 2 hernias scoliosis  PAIN:  Are you having pain? Yes: NPRS scale: 7/10 Pain location: right hip and leg to knee Pain description: cramping pulling, weak Aggravating  factors: getting up, standing and walking Relieving factors: rest  PRECAUTIONS: Fall  WEIGHT BEARING RESTRICTIONS: No  FALLS:  Has patient fallen in last 6 months? Yes. Number of falls 0  LIVING ENVIRONMENT: Lives with: lives alone Lives in: House/apartment Stairs: Yes: External: 2 steps; on right going up, on left going up, and can reach both Has  following equipment at home: Single point cane, Walker - 2 wheeled, shower chair, bed side commode, Grab bars, and None removeable shower head  OCCUPATION: not working, retired  PLOF: [place]: be able to get up and move around without pain; go to grocery and do my housework  NEXT MD VISIT: 4 weeks or after therapy  OBJECTIVE:   DIAGNOSTIC FINDINGS:  CLINICAL DATA:  Mid to lower back pain radiates to the left hip. Left buttock and left leg pain.   EXAM: MRI PELVIS WITHOUT CONTRAST   TECHNIQUE: Multiplanar multisequence MR imaging of the pelvis was performed. No intravenous contrast was administered.   COMPARISON:  CT examination dated March 20, 2018   FINDINGS: Urinary Tract:  No abnormality visualized.   Bowel:  Unremarkable visualized pelvic bowel loops.   Vascular/Lymphatic: No pathologically enlarged lymph nodes. No significant vascular abnormality seen.   Reproductive:  No mass or other significant abnormality   Other: Trace amount of fluid in the left inguinal region suspicious for small left femoral hernia.   Musculoskeletal: Status post bilateral hip arthroplasty. No appreciable joint effusion. No perihardware loosening or edema to suggest acute fracture. Susceptibility artifacts limits evaluation.   Bilateral hamstring origin tendinopathy. Generalized muscle atrophy. No intramuscular hematoma or fluid collection. No evidence of bursitis.   IMPRESSION: 1. Trace amount of fluid in the left inguinal region suspicious for small left femoral hernia. 2. Status post bilateral hip arthroplasty. No appreciable joint effusion. No perihardware loosening or edema to suggest acute fracture. 3. Bilateral hamstring origin tendinopathy.     Electronically Signed   By: Keane Police D.O.   On: 11/30/2022 23:27  CLINICAL DATA:  Mid to low back pain radiating to the left hip, buttock and leg.   EXAM: MRI LUMBAR SPINE WITHOUT CONTRAST    TECHNIQUE: Multiplanar, multisequence MR imaging of the lumbar spine was performed. No intravenous contrast was administered.   COMPARISON:  None Available.   FINDINGS: Segmentation:  5 lumbar type vertebral bodies.   Alignment: Scoliotic curvature convex to the right with the apex at L3.   Vertebrae: No fracture or focal bone lesion. No edematous marrow changes or edematous facet arthritis.   Conus medullaris and cauda equina: Conus extends to the L1 level. Conus and cauda equina appear normal.   Paraspinal and other soft tissues: Negative   Disc levels:   T12-L1: Chronic disc degeneration with endplate osteophytes and bulging of the disc more towards the right. Mild right foraminal narrowing, not likely compressive.   L1-2: Disc degeneration with loss of disc height. Endplate osteophytes and minimal bulging of the disc. Mild facet osteoarthritis. No compressive stenosis.   L2-3: Disc degeneration with desiccation and loss of height. Endplate osteophytes and bulging of the disc slightly more towards the left. Mild facet and ligamentous hypertrophy on the left. Mild narrowing of the left lateral recess but no likely neural compression.   L3-4: Endplate osteophytes and bulging of the disc slightly more prominent towards the left. Mild facet and ligamentous hypertrophy. Mild narrowing of the left lateral recess but no likely neural compression.   L4-5: Mild bulging of the disc  more prominent towards the right. Mild facet and ligamentous hypertrophy. Mild narrowing of the right lateral recess but no likely neural compression.   L5-S1: Minimal bulging of the disc more towards the right. No compressive canal or foraminal narrowing.   IMPRESSION: 1. Scoliotic curvature convex to the right with the apex at L3. 2. Chronic degenerative disc disease from T12-L1 through L5-S1. No apparent compressive stenosis of the canal or foramina. Mild lateral recess narrowing on the  left at L2-3 and L3-4 and on the right at L4-5. I do not see an abnormality to clearly explain the patient's left-sided radicular presentation.     Electronically Signed   By: Nelson Chimes M.D.   On: 12/01/2022 09:29    PATIENT SURVEYS:  LEFS 32/80   COGNITION: Overall cognitive status: Within functional limits for tasks assessed     SENSATION: WFL   POSTURE: rounded shoulders, forward head, and flexed trunk   PALPATION: Tenderness along spine; noted left hip elevation versus right; right trunk shift; concave spinal curve to the right lumbar spine  LUMBAR ROM:   AROM eval  Flexion 50% available  Extension 10% available  Right lateral flexion   Left lateral flexion   Right rotation   Left rotation    (Blank rows = not tested)  LOWER EXTREMITY ROM:     Active  Right eval Left eval  Hip flexion 62 80  Hip extension    Hip abduction    Hip adduction    Hip internal rotation    Hip external rotation    Knee flexion    Knee extension  -12 *  Ankle dorsiflexion    Ankle plantarflexion    Ankle inversion    Ankle eversion     (Blank rows = not tested)  LOWER EXTREMITY MMT:    MMT Right eval Left eval  Hip flexion 3+ 4  Hip extension    Hip abduction    Hip adduction    Hip internal rotation    Hip external rotation    Knee flexion    Knee extension 4+ 5  Ankle dorsiflexion 4+ 5  Ankle plantarflexion    Ankle inversion    Ankle eversion     (Blank rows = not tested)   FUNCTIONAL TESTS:  5 times sit to stand: 44.65 sec 2 minute walk test: 267 ft  GAIT: Distance walked: 267 Assistive device utilized: None Level of assistance: Modified independence Comments: left foot internal rotation; right trunk shift  TODAY'S TREATMENT:                                                                                                                              DATE: 01/08/2023 physical therapy evaluation and HEP instruction    PATIENT EDUCATION:   Education details: Patient educated on exam findings, POC, scope of PT, HEP, and what to expect next visit. Person educated: Patient Education method: Explanation, Demonstration, and Handouts Education comprehension: verbalized  understanding, returned demonstration, verbal cues required, and tactile cues required   HOME EXERCISE PROGRAM: Access Code: [email or web address] URL: [email or web address] Date: 01/09/2023 Prepared by: [facility] - Rehab  Exercises - Sit to Stand  - 2 x daily - 7 x weekly - 2 sets - 5 reps - right arm and reach overhead to the left  - 2 x daily - 7 x weekly - 1 sets - 10 reps - 2 sec hold - Supine Quad Set  - 2 x daily - 7 x weekly - 1 sets - 10 reps - 5 sec hold - Supine Bridge  - 2 x daily - 7 x weekly - 1 sets - 10 reps  ASSESSMENT:  CLINICAL IMPRESSION: Patient is a 88 y.o. female who was seen today for physical therapy evaluation and treatment for back pain and bilateral hip pain. Patient demonstrates muscle weakness, reduced ROM, and fascial restrictions which are likely contributing to symptoms of pain and are negatively impacting patient ability to perform ADLs and functional mobility tasks. Patient will benefit from skilled physical therapy services to address these deficits to reduce pain and improve level of function with ADLs and functional mobility tasks.   OBJECTIVE IMPAIRMENTS: Abnormal gait, decreased activity tolerance, decreased balance, decreased endurance, decreased mobility, difficulty walking, decreased ROM, decreased strength, hypomobility, increased edema, increased fascial restrictions, impaired perceived functional ability, increased muscle spasms, impaired flexibility, and pain.   ACTIVITY LIMITATIONS: carrying, lifting, bending, sitting, standing, squatting, sleeping, stairs, transfers, bed mobility, bathing, toileting, hygiene/grooming, and caring for others  PARTICIPATION LIMITATIONS: meal prep, cleaning, laundry, driving, shopping,  community activity, and yard work  PERSONAL FACTORS: Age and Time since onset of injury/illness/exacerbation are also affecting patient's functional outcome.   REHAB POTENTIAL: Good  CLINICAL DECISION MAKING: Stable/uncomplicated  EVALUATION COMPLEXITY: Low   GOALS: Goals reviewed with patient? No  SHORT TERM GOALS: Target date: 01/23/2023  patient will be independent with initial HEP Baseline: Goal status: INITIAL  2.   Patient will self report 30% improvement to improve tolerance for functional activity  Baseline:  Goal status: INITIAL   LONG TERM GOALS: Target date: 02/06/2023  Patient will be independent in self management strategies to improve quality of life and functional outcomes.   Baseline:  Goal status: INITIAL  2.   Patient will self report 50% improvement to improve tolerance for functional activity  Baseline:  Goal status: INITIAL  3.  Patient will improve LEFS score to 40/80 to demonstrate improved functional mobility  Baseline: 32/80 Goal status: INITIAL  4.  Patient will improve 5 times sit to stand score from 44.65  sec to 30 sec to demonstrate improved functional mobility and increased lower extremity strength Baseline: 44.65 sec Goal status: INITIAL  5.  Patient will increase distance on 2MWT to  325 ft  to demonstrate improved functional mobility walking household and community distances. Baseline: 267 ft Goal status: INITIAL   PLAN:  PT FREQUENCY: 2x/week  PT DURATION: 4 weeks  PLANNED INTERVENTIONS: Therapeutic exercises, Therapeutic activity, Neuromuscular re-education, Balance training, Gait training, Patient/Family education, Joint manipulation, Joint mobilization, Stair training, Orthotic/Fit training, DME instructions, Aquatic Therapy, Dry Needling, Electrical stimulation, Spinal manipulation, Spinal mobilization, Cryotherapy, Moist heat, Compression bandaging, scar mobilization, Splintting, Taping, Traction, Ultrasound,  Ionotophoresis '4mg'$ /ml Dexamethasone, and Manual therapy   PLAN FOR NEXT SESSION: Review HEP and goals, progress hip and core strength as able; address scoliosis, balance; gait; posture   3:25 PM, 01/09/23 Chyna Kneece [place] MPT [place] [facility] [place] [phone] Ph:[phone]

## 2023-01-09 ENCOUNTER — Ambulatory Visit (HOSPITAL_COMMUNITY): Payer: PPO | Attending: Orthopedic Surgery

## 2023-01-09 ENCOUNTER — Other Ambulatory Visit: Payer: Self-pay

## 2023-01-09 DIAGNOSIS — R262 Difficulty in walking, not elsewhere classified: Secondary | ICD-10-CM | POA: Diagnosis not present

## 2023-01-09 DIAGNOSIS — M25551 Pain in right hip: Secondary | ICD-10-CM | POA: Diagnosis not present

## 2023-01-09 DIAGNOSIS — M79605 Pain in left leg: Secondary | ICD-10-CM | POA: Insufficient documentation

## 2023-01-09 DIAGNOSIS — M545 Low back pain, unspecified: Secondary | ICD-10-CM | POA: Diagnosis not present

## 2023-01-22 ENCOUNTER — Ambulatory Visit (INDEPENDENT_AMBULATORY_CARE_PROVIDER_SITE_OTHER): Payer: PPO | Admitting: Pulmonary Disease

## 2023-01-22 ENCOUNTER — Encounter: Payer: Self-pay | Admitting: Pulmonary Disease

## 2023-01-22 ENCOUNTER — Ambulatory Visit: Payer: PPO | Admitting: Pulmonary Disease

## 2023-01-22 VITALS — BP 120/80 | HR 65 | Ht 68.0 in | Wt 155.6 lb

## 2023-01-22 DIAGNOSIS — J471 Bronchiectasis with (acute) exacerbation: Secondary | ICD-10-CM

## 2023-01-22 DIAGNOSIS — R0609 Other forms of dyspnea: Secondary | ICD-10-CM

## 2023-01-22 DIAGNOSIS — J479 Bronchiectasis, uncomplicated: Secondary | ICD-10-CM | POA: Diagnosis not present

## 2023-01-22 MED ORDER — DOXYCYCLINE HYCLATE 100 MG PO TABS: 100.0000 mg | ORAL_TABLET | Freq: Two times a day (BID) | ORAL | 0 refills | Status: AC

## 2023-01-22 NOTE — Progress Notes (Signed)
Subjective:    Patient ID: Christy Richardson, female    DOB: 1935-04-28, 88 y.o.   MRN: [id number]  Synopsis: This is a very pleasant female with bronchiectasis presumably from childhood pertussis.  She switched care from [facility] to [facility] (P[facility]) in 05/2012.  Her simple spirometry in 2013 was 78% predicted.  HPI No chief complaint on file.  Since the last visit Christy Richardson continues to struggle with shortness of breath.  She says that her hip is still bothering her.  She is about to start therapy.  She has not had fevers chills or weight loss.  However, in the last few weeks she has noticed increasing wheeze and drainage down the back of her throat as well as cough productive of mucus.  She uses her flutter valve regularly.  She says that when she coughs she does not typically cough up much mucus.  Past Medical History:  Diagnosis Date   Adenomatous polyp of colon 03/2007   Allergic rhinitis    Anxiety    a.) on BZO (lorazepam) PRN   Arthritis    Blood transfusion 1974   Bronchiectasis ([place])    Cancer ([place])    left arm   Deaf    in right 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 Mild TR; b.) TTE 07/16/2019: EF 60-65%, triv AR, mild MR/TR, G1DD.   Diet-controlled type 2 diabetes mellitus ([place])    Diverticulosis of colon (without mention of hemorrhage)    DOE (dyspnea on exertion)    Dyspnea    Fatigue    Fecal incontinence    GERD (gastroesophageal reflux disease)    Glaucoma    Goiter    right side   Heart murmur    History of stress test    a. 01/2014 Lexi MV: no ischemia/infarct.   Hyperkalemia    Hyperlipidemia    Hypertension    a. 11/2018 Renal artery duplex: No RAS.    Hypothyroidism    IBS (irritable bowel syndrome)    Migraine    Mitral valve prolapse    a. 01/2014 Echo: no MR/MVP noted.   Neck mass    right side/ no airway problems/ per pt has had since 2005  (goiter)   Nonspecific abnormal electrocardiogram (ECG) (EKG)    Palpitations    a. 01/2014 CardioNet: wore for 9 days (rash). Sinus tach - 100's. No SVT/AF.   Pneumonia    Polyp, sigmoid colon    Post-operative nausea and vomiting    Pulmonary fibrosis ([facility])    Pulmonary nodules    Recurrent UTI    Tachycardia    Tremor      Review of Systems  Constitutional:  Negative for chills, fatigue and fever.  HENT:  Negative for postnasal drip, rhinorrhea and sinus pressure.   Respiratory:  Positive for cough and shortness of breath. Negative for wheezing.   Cardiovascular:  Negative for chest pain, palpitations and leg swelling.      Objective:   Physical Exam  Vitals:   01/22/23 1258  BP: 120/80  Pulse: 65  SpO2: 97%  Weight: 155 lb 10.1 oz (70.6 kg)  Height: 5' 8"$  (1.727 m)   RA  Gen: chronically ill appearing HENT: OP clear, neck supple PULM: CTA B, normal effort  CV: RRR, no mgr GI: BS+, soft, nontender Derm: no cyanosis or rash Psyche: normal mood and  affect     PFT: 05/2012 simple spirometry> > Ratio 63%, FEV1 1.98L (78% pred) 05/05/2015 simple spirometry> ratio 60%, FEV1 1.79 L (77% predicted). March 2019 ratio 61%, FEV1 1.92 L 86% predicted, FVC 3.2 L 106% predicted, total lung capacity 5.5 L 100% predicted, DLCO 14.5 mL 51% predicted 01/2023 PFT> Ratio 64%, FEV1 1.72 L 83% predicted, total lung capacity 5.50 L 98% predicted, DLCO 15.1 58% predicted  Chest imaging: 11/2012 CT chest [facility] >> bilateral bronchiectasis, scattered nodules no greater than 30m in size March 2015 simple spirometry > ratio 62%, FEV1 1.87 L (79% predicted) February 2015 chest x-ray images reviewed: Elevated right hemidiaphragm, some chronic bronchitic changes, otherwise clear March 2019 chest x-ray images independently reviewed, hyperinflation noted, small pulmonary nodule stable. 08/2021 CT lungs> stable nodule RUL and bronchiectasis     Assessment & Plan:    No diagnosis  found.  Discussion: I am pleased to see that Christy Richardson's lung function has not really changed since the last test 5 years ago, only mild decline which is in keeping with her age.  She has not had a recent exacerbation but she is now suffering from an upper respiratory infection which tends to lead to a bronchiectasis flareup.  Plan: Bronchiectasis, with upper respiratory infection: Take doxycycline 100 mg twice daily for 5 days Practice good hand hygiene Stay physically active Let [place] if chest tightness wheezing or shortness of breath worsens Keep using the flutter valve as needed to help produce mucus  We will plan on seeing you back in 6 months, sooner if needed     Current Outpatient Medications:    acetaminophen (TYLENOL 8 HOUR ARTHRITIS PAIN) 650 MG CR tablet, Take 650 mg by mouth every 8 (eight) hours as needed for pain., Disp: , Rfl:    albuterol (PROVENTIL) (2.5 MG/3ML) 0.083% nebulizer solution, Take 3 mLs (2.5 mg total) by nebulization every 6 (six) hours as needed for wheezing or shortness of breath., Disp: 75 mL, Rfl: 12   albuterol (VENTOLIN HFA) 108 (90 Base) MCG/ACT inhaler, Inhale 2 puffs into the lungs every 6 (six) hours as needed for wheezing or shortness of breath., Disp: 8 g, Rfl: 6   amLODipine (NORVASC) 5 MG tablet, Take 1 tablet (5 mg total) by mouth 2 (two) times daily., Disp: 180 tablet, Rfl: 2   brimonidine (ALPHAGAN) 0.2 % ophthalmic solution, Place 1 drop into both eyes 2 (two) times daily. Place 1 drop into both eyes 2 (two) times daily., Disp: , Rfl:    carvedilol (COREG) 12.5 MG tablet, Take 1 tablet (12.5 mg total) by mouth 2 (two) times daily with a meal., Disp: 180 tablet, Rfl: 0   cephALEXin (KEFLEX) 500 MG capsule, Take 1 capsule (500 mg total) by mouth 2 (two) times daily., Disp: 14 capsule, Rfl: 0   cetirizine (ZYRTEC) 10 MG tablet, Take 10 mg by mouth every morning., Disp: , Rfl:    Cholecalciferol (VITAMIN D3 PO), Take 1 tablet by mouth daily at 6  (six) AM., Disp: , Rfl:    cyclobenzaprine (FLEXERIL) 10 MG tablet, Take 10 mg by mouth 3 (three) times daily as needed for muscle spasms., Disp: , Rfl:    docusate sodium (COLACE) 100 MG capsule, Take 1 capsule (100 mg total) by mouth 2 (two) times daily., Disp: 10 capsule, Rfl: 0   dorzolamide (TRUSOPT) 2 % ophthalmic solution, Place 1 drop into both eyes 2 (two) times daily. Place 1 drop into both eyes 2 (two) times daily., Disp: ,  Rfl:    gabapentin (NEURONTIN) 300 MG capsule, Take 300 mg by mouth at bedtime., Disp: , Rfl:    guaifenesin (MUCUS RELIEF) 400 MG TABS tablet, Take 1,200 mg by mouth every morning., Disp: , Rfl:    hydrALAZINE (APRESOLINE) 50 MG tablet, Take 0.5 tablets (25 mg total) by mouth daily as needed., Disp: 15 tablet, Rfl: 0   HYDROcodone-acetaminophen (NORCO/VICODIN) 5-325 MG tablet, Take 1-2 tablets by mouth every 4 (four) hours as needed for moderate pain (pain score 4-6)., Disp: 30 tablet, Rfl: 0   levothyroxine (SYNTHROID) 75 MCG tablet, Take 75 mcg by mouth daily before breakfast., Disp: , Rfl:    LORazepam (ATIVAN) 0.5 MG tablet, Take 0.5 mg by mouth 2 (two) times daily as needed for anxiety (tremors)., Disp: , Rfl:    losartan (COZAAR) 100 MG tablet, TAKE ONE TABLET BY MOUTH ONCE DAILY., Disp: 90 tablet, Rfl: 2   Multiple Vitamins-Minerals (MULTIVITAMIN WITH MINERALS) tablet, Take 1 tablet by mouth 3 (three) times a week., Disp: , Rfl:    pantoprazole (PROTONIX) 40 MG tablet, Take 1 tablet (40 mg total) by mouth daily before breakfast., Disp: 90 tablet, Rfl: 3   Polyethyl Glycol-Propyl Glycol (SYSTANE OP), Apply 1 drop to eye as needed., Disp: , Rfl:    pravastatin (PRAVACHOL) 40 MG tablet, Take 1 tablet (40 mg total) by mouth at bedtime., Disp: , Rfl:    Probiotic Product (PROBIOTIC PO), Take 1 tablet by mouth daily at 6 (six) AM., Disp: , Rfl:    rOPINIRole (REQUIP) 0.5 MG tablet, Take 0.5 mg by mouth at bedtime., Disp: , Rfl:    sertraline (ZOLOFT) 100 MG tablet,  Take 2 tablets by mouth every morning., Disp: , Rfl:    sodium chloride (OCEAN) 0.65 % nasal spray, Place 2 sprays into both nostrils daily at 6 (six) AM. Use as needed, Disp: , Rfl:    Tiotropium Bromide-Olodaterol (STIOLTO RESPIMAT) 2.5-2.5 MCG/ACT AERS, Inhale 2 puffs into the lungs daily. (Patient taking differently: Inhale 2 puffs into the lungs every morning.), Disp: 4 g, Rfl: 5   traMADol (ULTRAM) 50 MG tablet, Take 1 tablet (50 mg total) by mouth every 6 (six) hours as needed., Disp: 30 tablet, Rfl: 0   traZODone (DESYREL) 50 MG tablet, Take 50 mg by mouth at bedtime., Disp: , Rfl:    triamcinolone (NASACORT) 55 MCG/ACT AERO nasal inhaler, Place 1 spray into the nose at bedtime., Disp: , Rfl:

## 2023-01-22 NOTE — Progress Notes (Signed)
Pre Spirometry, DLCO and pleth completed today

## 2023-01-22 NOTE — Patient Instructions (Signed)
Bronchiectasis, with upper respiratory infection: Take doxycycline 100 mg twice daily for 5 days Practice good hand hygiene Stay physically active Let us know if chest tightness wheezing or shortness of breath worsens Keep using the flutter valve as needed to help produce mucus  We will plan on seeing you back in 6 months, sooner if needed

## 2023-01-23 ENCOUNTER — Ambulatory Visit (HOSPITAL_COMMUNITY): Payer: PPO | Admitting: Physical Therapy

## 2023-01-23 DIAGNOSIS — M79605 Pain in left leg: Secondary | ICD-10-CM

## 2023-01-23 DIAGNOSIS — M25551 Pain in right hip: Secondary | ICD-10-CM

## 2023-01-23 DIAGNOSIS — M545 Low back pain, unspecified: Secondary | ICD-10-CM

## 2023-01-23 DIAGNOSIS — R262 Difficulty in walking, not elsewhere classified: Secondary | ICD-10-CM

## 2023-01-23 LAB — PULMONARY FUNCTION TEST
DL/VA % pred: 64 %
DL/VA: 2.54 ml/min/mmHg/L
DLCO cor % pred: 58 %
DLCO cor: 11.88 ml/min/mmHg
DLCO unc % pred: 58 %
DLCO unc: 11.88 ml/min/mmHg
FEF 25-75 Pre: 0.94 L/sec
FEF2575-%Pred-Pre: 71 %
FEV1-%Pred-Pre: 83 %
FEV1-Pre: 1.72 L
FEV1FVC-%Pred-Pre: 87 %
FEV6-%Pred-Pre: 102 %
FEV6-Pre: 2.67 L
FEV6FVC-%Pred-Pre: 104 %
FVC-%Pred-Pre: 98 %
Pre FEV1/FVC ratio: 64 %
Pre FEV6/FVC Ratio: 99 %
RV % pred: 97 %
RV: 2.58 L
TLC % pred: 98 %
TLC: 5.43 L

## 2023-01-23 NOTE — Therapy (Signed)
OUTPATIENT PHYSICAL THERAPY TREATMENT   Patient Name: Christy Richardson MRN: [id number] DOB:[birth year], 88 y.o., female Today's Date: 01/23/2023  END OF SESSION:  PT End of Session - 01/23/23 1503     Visit Number 2    Number of Visits 8    Date for PT Re-Evaluation 02/06/23    Authorization Type [employer]    Authorization Time Period no auth, no VL    PT Start Time 1305    PT Stop Time 1345    PT Time Calculation (min) 40 min    Activity Tolerance Patient tolerated treatment well    Behavior During Therapy [facility] for tasks assessed/performed              Past Medical History:  Diagnosis Date   Adenomatous polyp of colon 03/2007   Allergic rhinitis    Anxiety    a.) on BZO (lorazepam) PRN   Arthritis    Blood transfusion 1974   Bronchiectasis ([place])    Cancer ([place])    left arm   Deaf    in right 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 Mild TR; b.) TTE 07/16/2019: EF 60-65%, triv AR, mild MR/TR, G1DD.   Diet-controlled type 2 diabetes mellitus ([place])    Diverticulosis of colon (without mention of hemorrhage)    DOE (dyspnea on exertion)    Dyspnea    Fatigue    Fecal incontinence    GERD (gastroesophageal reflux disease)    Glaucoma    Goiter    right side   Heart murmur    History of stress test    a. 01/2014 Lexi MV: no ischemia/infarct.   Hyperkalemia    Hyperlipidemia    Hypertension    a. 11/2018 Renal artery duplex: No RAS.    Hypothyroidism    IBS (irritable bowel syndrome)    Migraine    Mitral valve prolapse    a. 01/2014 Echo: no MR/MVP noted.   Neck mass    right side/ no airway problems/ per pt has had since 2005 (goiter)   Nonspecific abnormal electrocardiogram (ECG) (EKG)    Palpitations    a. 01/2014 CardioNet: wore for 9 days (rash). Sinus tach - 100's. No SVT/AF.   Pneumonia    Polyp, sigmoid colon    Post-operative nausea and  vomiting    Pulmonary fibrosis (HCC)    Pulmonary nodules    Recurrent UTI    Tachycardia    Tremor    Past Surgical History:  Procedure Laterality Date   ABDOMINAL HYSTERECTOMY     CARDIOVASCULAR STRESS TEST  09/19/2011   No scintigraphic evidence of inducible myocardial ischemia. Pharmacological stress test without chest pain or EKG changes for ischemia.   COLONOSCOPY     ESOPHAGOGASTRODUODENOSCOPY     MASTOIDECTOMY     rt ear/ as a teenager   OOPHORECTOMY     SKIN CANCER EXCISION Left    arm   TOTAL HIP ARTHROPLASTY  5/10   left   TOTAL HIP ARTHROPLASTY Right 07/10/2022   Procedure: TOTAL HIP ARTHROPLASTY ANTERIOR APPROACH;  Surgeon: Hessie Knows, MD;  Location: [facility] ORS;  Service: Orthopedics;  Laterality: Right;   TRANSTHORACIC ECHOCARDIOGRAM  09/19/2011   EF 123456, stage 1 diastolic dysfunction, mild tricuspid valve regurg   Patient Active Problem List   Diagnosis Date Noted   S/P total right hip arthroplasty 07/11/2022  Status post total hip replacement, right 07/10/2022   Preop respiratory exam 06/21/2022   Sinus tachycardia 02/04/2018   Hyperlipidemia LDL goal <70 08/16/2015   Left ventricular diastolic dysfunction, NYHA class 1 03/13/2014   Chest pain 01/30/2014   Tremor, essential 05/07/2013   Fecal incontinence 09/30/2012   Pulmonary nodules 05/15/2012   Dyspnea 05/27/2011   Bronchiectasis without acute exacerbation ([place]) 09/26/2009   OSTEOPENIA 03/17/2009   COLONIC POLYPS, ADENOMATOUS, HX OF 09/27/2008   BACK PAIN 09/01/2008   LIPOMA 06/01/2008   UTI'S, RECURRENT 06/10/2007   Hypothyroidism 01/07/2007   Hyperlipidemia 01/07/2007   Anxiety 01/07/2007   DEPRESSION 01/07/2007   MIGRAINE HEADACHE 01/07/2007   GLAUCOMA NEC 01/07/2007   DECREASED HEARING 01/07/2007   Essential hypertension 01/07/2007   Allergic rhinitis 01/07/2007   GERD 01/07/2007   IBS 01/07/2007   OSTEOARTHRITIS 01/07/2007    PCP: Celene Squibb, MD  REFERRING PROVIDER: [place]; Dr. Rudene Christians  REFERRING DIAG: status post toatl replacment of right hip arthritis of right hip chronic bilateral low back pain without sciatica  Rationale for Evaluation and Treatment: Rehabilitation  THERAPY DIAG:  No diagnosis found.  ONSET DATE: 07/10/2022   SUBJECTIVE:                                                                                                                                                                                           SUBJECTIVE STATEMENT: Pt states the pain is horrible at 6/10, the spasms are bad sometimes in her groin and wraps around her thigh.  States she is miserable and has affected her function.  Reports she has to stand a minute when she initially gets up as her Rt LE feels it will 'give way" and her balance is horrible.  Evaluation: Had total hip right back in August.  After surgery BP dropped a lot so was unable to take much therapy or pain medication.  Went to rehab for 3 days; "It was a hell hole".  Came home; developed a blood clot behind her right knee.  Home health came out for about 2 weeks.   " Maybe I did not do as much as I should have".  Has had muscle spasms since surgery and MD gave her Gabapentin that has helped some but " I am in worse shape than I was before I had the surgery".  Reports difficulty with getting up out of a chair; getting in and out of a car.  Using a walker a little; mostly just to get up; sometimes "I almost fall"; also reports some swelling in her legs.   PERTINENT HISTORY:  S/P right  THA 07/10/22 Left THA 2010 2 hernias scoliosis  PAIN:  Are you having pain? Yes: NPRS scale: 7/10 Pain location: right hip and leg to knee Pain description: cramping pulling, weak Aggravating factors: getting up, standing and walking Relieving factors: rest  PRECAUTIONS: Fall  WEIGHT BEARING RESTRICTIONS: No  FALLS:  Has patient fallen in last 6 months? Yes. Number of falls 0  LIVING ENVIRONMENT: Lives with:  lives alone Lives in: House/apartment Stairs: Yes: External: 2 steps; on right going up, on left going up, and can reach both Has following equipment at home: Single point cane, Walker - 2 wheeled, shower chair, bed side commode, Grab bars, and None removeable shower head  OCCUPATION: not working, retired  PLOF: [place]: be able to get up and move around without pain; go to grocery and do my housework  NEXT MD VISIT: 4 weeks or after therapy  OBJECTIVE:   DIAGNOSTIC FINDINGS:  CLINICAL DATA:  Mid to lower back pain radiates to the left hip. Left buttock and left leg pain.   EXAM: MRI PELVIS WITHOUT CONTRAST   TECHNIQUE: Multiplanar multisequence MR imaging of the pelvis was performed. No intravenous contrast was administered.   COMPARISON:  CT examination dated March 20, 2018   FINDINGS: Urinary Tract:  No abnormality visualized.   Bowel:  Unremarkable visualized pelvic bowel loops.   Vascular/Lymphatic: No pathologically enlarged lymph nodes. No significant vascular abnormality seen.   Reproductive:  No mass or other significant abnormality   Other: Trace amount of fluid in the left inguinal region suspicious for small left femoral hernia.   Musculoskeletal: Status post bilateral hip arthroplasty. No appreciable joint effusion. No perihardware loosening or edema to suggest acute fracture. Susceptibility artifacts limits evaluation.   Bilateral hamstring origin tendinopathy. Generalized muscle atrophy. No intramuscular hematoma or fluid collection. No evidence of bursitis.   IMPRESSION: 1. Trace amount of fluid in the left inguinal region suspicious for small left femoral hernia. 2. Status post bilateral hip arthroplasty. No appreciable joint effusion. No perihardware loosening or edema to suggest acute fracture. 3. Bilateral hamstring origin tendinopathy.     Electronically Signed   By: Keane Police D.O.   On: 11/30/2022  23:27  CLINICAL DATA:  Mid to low back pain radiating to the left hip, buttock and leg.   EXAM: MRI LUMBAR SPINE WITHOUT CONTRAST   TECHNIQUE: Multiplanar, multisequence MR imaging of the lumbar spine was performed. No intravenous contrast was administered.   COMPARISON:  None Available.   FINDINGS: Segmentation:  5 lumbar type vertebral bodies.   Alignment: Scoliotic curvature convex to the right with the apex at L3.   Vertebrae: No fracture or focal bone lesion. No edematous marrow changes or edematous facet arthritis.   Conus medullaris and cauda equina: Conus extends to the L1 level. Conus and cauda equina appear normal.   Paraspinal and other soft tissues: Negative   Disc levels:   T12-L1: Chronic disc degeneration with endplate osteophytes and bulging of the disc more towards the right. Mild right foraminal narrowing, not likely compressive.   L1-2: Disc degeneration with loss of disc height. Endplate osteophytes and minimal bulging of the disc. Mild facet osteoarthritis. No compressive stenosis.   L2-3: Disc degeneration with desiccation and loss of height. Endplate osteophytes and bulging of the disc slightly more towards the left. Mild facet and ligamentous hypertrophy on the left. Mild narrowing of the left lateral recess but no likely neural compression.   L3-4: Endplate osteophytes and bulging of  the disc slightly more prominent towards the left. Mild facet and ligamentous hypertrophy. Mild narrowing of the left lateral recess but no likely neural compression.   L4-5: Mild bulging of the disc more prominent towards the right. Mild facet and ligamentous hypertrophy. Mild narrowing of the right lateral recess but no likely neural compression.   L5-S1: Minimal bulging of the disc more towards the right. No compressive canal or foraminal narrowing.   IMPRESSION: 1. Scoliotic curvature convex to the right with the apex at L3. 2. Chronic degenerative  disc disease from T12-L1 through L5-S1. No apparent compressive stenosis of the canal or foramina. Mild lateral recess narrowing on the left at L2-3 and L3-4 and on the right at L4-5. I do not see an abnormality to clearly explain the patient's left-sided radicular presentation.     Electronically Signed   By: Nelson Chimes M.D.   On: 12/01/2022 09:29    PATIENT SURVEYS:  LEFS 32/80   COGNITION: Overall cognitive status: Within functional limits for tasks assessed     SENSATION: WFL   POSTURE: rounded shoulders, forward head, and flexed trunk   PALPATION: Tenderness along spine; noted left hip elevation versus right; right trunk shift; concave spinal curve to the right lumbar spine  LUMBAR ROM:   AROM eval  Flexion 50% available  Extension 10% available  Right lateral flexion   Left lateral flexion   Right rotation   Left rotation    (Blank rows = not tested)  LOWER EXTREMITY ROM:     Active  Right eval Left eval  Hip flexion 62 80  Hip extension    Hip abduction    Hip adduction    Hip internal rotation    Hip external rotation    Knee flexion    Knee extension  -12 *  Ankle dorsiflexion    Ankle plantarflexion    Ankle inversion    Ankle eversion     (Blank rows = not tested)  LOWER EXTREMITY MMT:    MMT Right eval Left eval  Hip flexion 3+ 4  Hip extension    Hip abduction    Hip adduction    Hip internal rotation    Hip external rotation    Knee flexion    Knee extension 4+ 5  Ankle dorsiflexion 4+ 5  Ankle plantarflexion    Ankle inversion    Ankle eversion     (Blank rows = not tested)   FUNCTIONAL TESTS:  5 times sit to stand: 44.65 sec 2 minute walk test: 267 ft  GAIT: Distance walked: 267 Assistive device utilized: None Level of assistance: Modified independence Comments: left foot internal rotation; right trunk shift  TODAY'S TREATMENT:                                                                                                                               DATE:  01/23/23 Goal review, HEP review Nustep 5 minutes level  2 UE/LE seat 10 Standing hip excursions 5X each  Corner stretch 3X20"  Seated scap retractions 10X"  Postural corrections  Rt OH reach to Lt 10X  Sit to stands no UE 10X Supine bridge 15X5"  Trunk rotations 10X each  SLR 10X each  01/08/2023 physical therapy evaluation and HEP instruction    PATIENT EDUCATION:  Education details: Patient educated on exam findings, POC, scope of PT, HEP, and what to expect next visit. Person educated: Patient Education method: Explanation, Demonstration, and Handouts Education comprehension: verbalized understanding, returned demonstration, verbal cues required, and tactile cues required   HOME EXERCISE PROGRAM: Access Code: [email or web address] URL: [email or web address] Date: 01/09/2023 Prepared by: AP - Rehab  Exercises - Sit to Stand  - 2 x daily - 7 x weekly - 2 sets - 5 reps - right arm and reach overhead to the left  - 2 x daily - 7 x weekly - 1 sets - 10 reps - 2 sec hold - Supine Quad Set  - 2 x daily - 7 x weekly - 1 sets - 10 reps - 5 sec hold - Supine Bridge  - 2 x daily - 7 x weekly - 1 sets - 10 reps  ASSESSMENT:  CLINICAL IMPRESSION: Goals and POC moving forward reviewed with patient while warming up on nustep.  Educated and instructed on improving her posture, however minimal results as "can't do it, my back has been bad since I was a teenager and can't sit up tall".  Pt maintains a forward bent posture throughout session, especially in seated position.  cues to maintain forward gaze and keep head up with activities. Corner stretch was completed without complaints of pain and appears to be of benefit for tight chest/rounded shoulders.   Did not update HEP this session.   Patient will benefit from skilled physical therapy services to address these deficits to reduce pain and improve level of function with ADLs and functional  mobility tasks.   OBJECTIVE IMPAIRMENTS: Abnormal gait, decreased activity tolerance, decreased balance, decreased endurance, decreased mobility, difficulty walking, decreased ROM, decreased strength, hypomobility, increased edema, increased fascial restrictions, impaired perceived functional ability, increased muscle spasms, impaired flexibility, and pain.   ACTIVITY LIMITATIONS: carrying, lifting, bending, sitting, standing, squatting, sleeping, stairs, transfers, bed mobility, bathing, toileting, hygiene/grooming, and caring for others  PARTICIPATION LIMITATIONS: meal prep, cleaning, laundry, driving, shopping, community activity, and yard work  PERSONAL FACTORS: Age and Time since onset of injury/illness/exacerbation are also affecting patient's functional outcome.   REHAB POTENTIAL: Good  CLINICAL DECISION MAKING: Stable/uncomplicated  EVALUATION COMPLEXITY: Low   GOALS: Goals reviewed with patient? No  SHORT TERM GOALS: Target date: 01/23/2023  patient will be independent with initial HEP Baseline: Goal status: IN PROGRESS  2.   Patient will self report 30% improvement to improve tolerance for functional activity  Baseline:  Goal status: IN PROGRESS   LONG TERM GOALS: Target date: 02/06/2023  Patient will be independent in self management strategies to improve quality of life and functional outcomes.   Baseline:  Goal status: IN PROGRESS  2.   Patient will self report 50% improvement to improve tolerance for functional activity  Baseline:  Goal status: IN PROGRESS  3.  Patient will improve LEFS score to 40/80 to demonstrate improved functional mobility  Baseline: 32/80 Goal status: IN PROGRESS  4.  Patient will improve 5 times sit to stand score from 44.65  sec to 30 sec to demonstrate improved functional mobility and increased lower extremity strength Baseline: 44.65  sec Goal status: IN PROGRESS  5.  Patient will increase distance on 2MWT to  325 ft  to  demonstrate improved functional mobility walking household and community distances. Baseline: 267 ft Goal status: IN PROGRESS   PLAN:  PT FREQUENCY: 2x/week  PT DURATION: 4 weeks  PLANNED INTERVENTIONS: Therapeutic exercises, Therapeutic activity, Neuromuscular re-education, Balance training, Gait training, Patient/Family education, Joint manipulation, Joint mobilization, Stair training, Orthotic/Fit training, DME instructions, Aquatic Therapy, Dry Needling, Electrical stimulation, Spinal manipulation, Spinal mobilization, Cryotherapy, Moist heat, Compression bandaging, scar mobilization, Splintting, Taping, Traction, Ultrasound, Ionotophoresis 47m/ml Dexamethasone, and Manual therapy   PLAN FOR NEXT SESSION: Review HEP and goals, progress hip and core strength as able; address scoliosis, balance; gait; posture.  Update HEP next session.   3:04 PM, 01/23/23 ATeena Irani PTA/CLT CLa HondaPh: [phone]

## 2023-01-29 ENCOUNTER — Ambulatory Visit (HOSPITAL_COMMUNITY): Payer: PPO

## 2023-01-29 DIAGNOSIS — R262 Difficulty in walking, not elsewhere classified: Secondary | ICD-10-CM

## 2023-01-29 DIAGNOSIS — M545 Low back pain, unspecified: Secondary | ICD-10-CM | POA: Diagnosis not present

## 2023-01-29 DIAGNOSIS — M79605 Pain in left leg: Secondary | ICD-10-CM

## 2023-01-29 DIAGNOSIS — M25551 Pain in right hip: Secondary | ICD-10-CM

## 2023-01-29 NOTE — Therapy (Signed)
OUTPATIENT PHYSICAL THERAPY TREATMENT   Patient Name: Christy Richardson MRN: [id number] DOB:[birth year], 88 y.o., female Today's Date: 01/29/2023  END OF SESSION:  PT End of Session - 01/29/23 1348     Visit Number 3    Number of Visits 8    Date for PT Re-Evaluation 02/06/23    Authorization Type [employer]    Authorization Time Period no auth, no VL    PT Start Time 1350    PT Stop Time 1430    PT Time Calculation (min) 40 min    Activity Tolerance Patient tolerated treatment well    Behavior During Therapy WFL for tasks assessed/performed              Past Medical History:  Diagnosis Date   Adenomatous polyp of colon 03/2007   Allergic rhinitis    Anxiety    a.) on BZO (lorazepam) PRN   Arthritis    Blood transfusion 1974   Bronchiectasis ([place])    Cancer ([place])    left arm   Deaf    in right 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 Mild TR; b.) TTE 07/16/2019: EF 60-65%, triv AR, mild MR/TR, G1DD.   Diet-controlled type 2 diabetes mellitus ([place])    Diverticulosis of colon (without mention of hemorrhage)    DOE (dyspnea on exertion)    Dyspnea    Fatigue    Fecal incontinence    GERD (gastroesophageal reflux disease)    Glaucoma    Goiter    right side   Heart murmur    History of stress test    a. 01/2014 Lexi MV: no ischemia/infarct.   Hyperkalemia    Hyperlipidemia    Hypertension    a. 11/2018 Renal artery duplex: No RAS.    Hypothyroidism    IBS (irritable bowel syndrome)    Migraine    Mitral valve prolapse    a. 01/2014 Echo: no MR/MVP noted.   Neck mass    right side/ no airway problems/ per pt has had since 2005 (goiter)   Nonspecific abnormal electrocardiogram (ECG) (EKG)    Palpitations    a. 01/2014 CardioNet: wore for 9 days (rash). Sinus tach - 100's. No SVT/AF.   Pneumonia    Polyp, sigmoid colon    Post-operative nausea and  vomiting    Pulmonary fibrosis (HCC)    Pulmonary nodules    Recurrent UTI    Tachycardia    Tremor    Past Surgical History:  Procedure Laterality Date   ABDOMINAL HYSTERECTOMY     CARDIOVASCULAR STRESS TEST  09/19/2011   No scintigraphic evidence of inducible myocardial ischemia. Pharmacological stress test without chest pain or EKG changes for ischemia.   COLONOSCOPY     ESOPHAGOGASTRODUODENOSCOPY     MASTOIDECTOMY     rt ear/ as a teenager   OOPHORECTOMY     SKIN CANCER EXCISION Left    arm   TOTAL HIP ARTHROPLASTY  5/10   left   TOTAL HIP ARTHROPLASTY Right 07/10/2022   Procedure: TOTAL HIP ARTHROPLASTY ANTERIOR APPROACH;  Surgeon: Hessie Knows, MD;  Location: [facility] ORS;  Service: Orthopedics;  Laterality: Right;   TRANSTHORACIC ECHOCARDIOGRAM  09/19/2011   EF 123456, stage 1 diastolic dysfunction, mild tricuspid valve regurg   Patient Active Problem List   Diagnosis Date Noted   S/P total right hip arthroplasty 07/11/2022  Status post total hip replacement, right 07/10/2022   Preop respiratory exam 06/21/2022   Sinus tachycardia 02/04/2018   Hyperlipidemia LDL goal <70 08/16/2015   Left ventricular diastolic dysfunction, NYHA class 1 03/13/2014   Chest pain 01/30/2014   Tremor, essential 05/07/2013   Fecal incontinence 09/30/2012   Pulmonary nodules 05/15/2012   Dyspnea 05/27/2011   Bronchiectasis without acute exacerbation ([place]) 09/26/2009   OSTEOPENIA 03/17/2009   COLONIC POLYPS, ADENOMATOUS, HX OF 09/27/2008   BACK PAIN 09/01/2008   LIPOMA 06/01/2008   UTI'S, RECURRENT 06/10/2007   Hypothyroidism 01/07/2007   Hyperlipidemia 01/07/2007   Anxiety 01/07/2007   DEPRESSION 01/07/2007   MIGRAINE HEADACHE 01/07/2007   GLAUCOMA NEC 01/07/2007   DECREASED HEARING 01/07/2007   Essential hypertension 01/07/2007   Allergic rhinitis 01/07/2007   GERD 01/07/2007   IBS 01/07/2007   OSTEOARTHRITIS 01/07/2007    PCP: Celene Squibb, MD  REFERRING PROVIDER: [place]; Dr. Rudene Christians  REFERRING DIAG: status post toatl replacment of right hip arthritis of right hip chronic bilateral low back pain without sciatica  Rationale for Evaluation and Treatment: Rehabilitation  THERAPY DIAG:  Low back pain, unspecified back pain laterality, unspecified chronicity, unspecified whether sciatica present  Pain in left leg  Difficulty in walking, not elsewhere classified  Pain in right hip  ONSET DATE: 07/10/2022   SUBJECTIVE:                                                                                                                                                                                           SUBJECTIVE STATEMENT: Feels she is about the same; feels she is walking better but has pain with standing and walking right hip and constant pain right low back and glute; reports muscle spasms at night.  4-5/10 pain today  Evaluation: Had total hip right back in August.  After surgery BP dropped a lot so was unable to take much therapy or pain medication.  Went to rehab for 3 days; "It was a hell hole".  Came home; developed a blood clot behind her right knee.  Home health came out for about 2 weeks.   " Maybe I did not do as much as I should have".  Has had muscle spasms since surgery and MD gave her Gabapentin that has helped some but " I am in worse shape than I was before I had the surgery".  Reports difficulty with getting up out of a chair; getting in and out of a car.  Using a walker a little; mostly just to get up; sometimes "I almost fall"; also reports some swelling in her legs.  PERTINENT HISTORY:  S/P right THA 07/10/22 Left THA 2010 2 hernias scoliosis  PAIN:  Are you having pain? Yes: NPRS scale: 7/10 Pain location: right hip and leg to knee Pain description: cramping pulling, weak Aggravating factors: getting up, standing and walking Relieving factors: rest  PRECAUTIONS: Fall  WEIGHT BEARING RESTRICTIONS: No  FALLS:  Has patient  fallen in last 6 months? Yes. Number of falls 0  LIVING ENVIRONMENT: Lives with: lives alone Lives in: House/apartment Stairs: Yes: External: 2 steps; on right going up, on left going up, and can reach both Has following equipment at home: Single point cane, Walker - 2 wheeled, shower chair, bed side commode, Grab bars, and None removeable shower head  OCCUPATION: not working, retired  PLOF: [place]: be able to get up and move around without pain; go to grocery and do my housework  NEXT MD VISIT: 4 weeks or after therapy  OBJECTIVE:   DIAGNOSTIC FINDINGS:  CLINICAL DATA:  Mid to lower back pain radiates to the left hip. Left buttock and left leg pain.   EXAM: MRI PELVIS WITHOUT CONTRAST   TECHNIQUE: Multiplanar multisequence MR imaging of the pelvis was performed. No intravenous contrast was administered.   COMPARISON:  CT examination dated March 20, 2018   FINDINGS: Urinary Tract:  No abnormality visualized.   Bowel:  Unremarkable visualized pelvic bowel loops.   Vascular/Lymphatic: No pathologically enlarged lymph nodes. No significant vascular abnormality seen.   Reproductive:  No mass or other significant abnormality   Other: Trace amount of fluid in the left inguinal region suspicious for small left femoral hernia.   Musculoskeletal: Status post bilateral hip arthroplasty. No appreciable joint effusion. No perihardware loosening or edema to suggest acute fracture. Susceptibility artifacts limits evaluation.   Bilateral hamstring origin tendinopathy. Generalized muscle atrophy. No intramuscular hematoma or fluid collection. No evidence of bursitis.   IMPRESSION: 1. Trace amount of fluid in the left inguinal region suspicious for small left femoral hernia. 2. Status post bilateral hip arthroplasty. No appreciable joint effusion. No perihardware loosening or edema to suggest acute fracture. 3. Bilateral hamstring origin tendinopathy.      Electronically Signed   By: Keane Police D.O.   On: 11/30/2022 23:27  CLINICAL DATA:  Mid to low back pain radiating to the left hip, buttock and leg.   EXAM: MRI LUMBAR SPINE WITHOUT CONTRAST   TECHNIQUE: Multiplanar, multisequence MR imaging of the lumbar spine was performed. No intravenous contrast was administered.   COMPARISON:  None Available.   FINDINGS: Segmentation:  5 lumbar type vertebral bodies.   Alignment: Scoliotic curvature convex to the right with the apex at L3.   Vertebrae: No fracture or focal bone lesion. No edematous marrow changes or edematous facet arthritis.   Conus medullaris and cauda equina: Conus extends to the L1 level. Conus and cauda equina appear normal.   Paraspinal and other soft tissues: Negative   Disc levels:   T12-L1: Chronic disc degeneration with endplate osteophytes and bulging of the disc more towards the right. Mild right foraminal narrowing, not likely compressive.   L1-2: Disc degeneration with loss of disc height. Endplate osteophytes and minimal bulging of the disc. Mild facet osteoarthritis. No compressive stenosis.   L2-3: Disc degeneration with desiccation and loss of height. Endplate osteophytes and bulging of the disc slightly more towards the left. Mild facet and ligamentous hypertrophy on the left. Mild narrowing of the left lateral recess but no likely neural compression.   L3-4:  Endplate osteophytes and bulging of the disc slightly more prominent towards the left. Mild facet and ligamentous hypertrophy. Mild narrowing of the left lateral recess but no likely neural compression.   L4-5: Mild bulging of the disc more prominent towards the right. Mild facet and ligamentous hypertrophy. Mild narrowing of the right lateral recess but no likely neural compression.   L5-S1: Minimal bulging of the disc more towards the right. No compressive canal or foraminal narrowing.   IMPRESSION: 1. Scoliotic curvature  convex to the right with the apex at L3. 2. Chronic degenerative disc disease from T12-L1 through L5-S1. No apparent compressive stenosis of the canal or foramina. Mild lateral recess narrowing on the left at L2-3 and L3-4 and on the right at L4-5. I do not see an abnormality to clearly explain the patient's left-sided radicular presentation.     Electronically Signed   By: Nelson Chimes M.D.   On: 12/01/2022 09:29    PATIENT SURVEYS:  LEFS 32/80   COGNITION: Overall cognitive status: Within functional limits for tasks assessed     SENSATION: WFL   POSTURE: rounded shoulders, forward head, and flexed trunk   PALPATION: Tenderness along spine; noted left hip elevation versus right; right trunk shift; concave spinal curve to the right lumbar spine  LUMBAR ROM:   AROM eval  Flexion 50% available  Extension 10% available  Right lateral flexion   Left lateral flexion   Right rotation   Left rotation    (Blank rows = not tested)  LOWER EXTREMITY ROM:     Active  Right eval Left eval  Hip flexion 62 80  Hip extension    Hip abduction    Hip adduction    Hip internal rotation    Hip external rotation    Knee flexion    Knee extension  -12 *  Ankle dorsiflexion    Ankle plantarflexion    Ankle inversion    Ankle eversion     (Blank rows = not tested)  LOWER EXTREMITY MMT:    MMT Right eval Left eval  Hip flexion 3+ 4  Hip extension    Hip abduction    Hip adduction    Hip internal rotation    Hip external rotation    Knee flexion    Knee extension 4+ 5  Ankle dorsiflexion 4+ 5  Ankle plantarflexion    Ankle inversion    Ankle eversion     (Blank rows = not tested)   FUNCTIONAL TESTS:  5 times sit to stand: 44.65 sec 2 minute walk test: 267 ft  GAIT: Distance walked: 267 Assistive device utilized: None Level of assistance: Modified independence Comments: left foot internal rotation; right trunk shift  TODAY'S TREATMENT:                                                                                                                               DATE:  01/29/23 Supine: Moist heat  to right low back and glute while in supine LTR x 15 Leg lengtheners 5" hold x 8 Leg press 5" hold x 8  Seated: Scapular retraction x 8  Standing: Heel raises x 10 Hip abduction x 10 Hip extension x 10 Anterior hip stretch (right foot back and raise right arm) x 8   01/23/23 Goal review, HEP review Nustep 5 minutes level 2 UE/LE seat 10 Standing hip excursions 5X each  Corner stretch 3X20"  Seated scap retractions 10X"  Postural corrections  Rt OH reach to Lt 10X  Sit to stands no UE 10X Supine bridge 15X5"  Trunk rotations 10X each  SLR 10X each  01/08/2023 physical therapy evaluation and HEP instruction    PATIENT EDUCATION:  Education details: Patient educated on exam findings, POC, scope of PT, HEP, and what to expect next visit. Person educated: Patient Education method: Explanation, Demonstration, and Handouts Education comprehension: verbalized understanding, returned demonstration, verbal cues required, and tactile cues required   HOME EXERCISE PROGRAM: 01/29/23 leg press and leg lengthener Access Code: [email or web address] URL: [email or web address] Date: 01/09/2023 Prepared by: [facility] - Rehab  Exercises - Sit to Stand  - 2 x daily - 7 x weekly - 2 sets - 5 reps - right arm and reach overhead to the left  - 2 x daily - 7 x weekly - 1 sets - 10 reps - 2 sec hold - Supine Quad Set  - 2 x daily - 7 x weekly - 1 sets - 10 reps - 5 sec hold - Supine Bridge  - 2 x daily - 7 x weekly - 1 sets - 10 reps  ASSESSMENT:  CLINICAL IMPRESSION: Today's session addressed core and postural strengthening.  Patient walking with increased gait speed  but continues with shifted trunk and rounded shoulders, forward head. Added leg lengthener and leg press to treatment; updated HEP. Slight increase in pain with treatment today.    Patient  will benefit from skilled physical therapy services to address these deficits to reduce pain and improve level of function with ADLs and functional mobility tasks.   OBJECTIVE IMPAIRMENTS: Abnormal gait, decreased activity tolerance, decreased balance, decreased endurance, decreased mobility, difficulty walking, decreased ROM, decreased strength, hypomobility, increased edema, increased fascial restrictions, impaired perceived functional ability, increased muscle spasms, impaired flexibility, and pain.   ACTIVITY LIMITATIONS: carrying, lifting, bending, sitting, standing, squatting, sleeping, stairs, transfers, bed mobility, bathing, toileting, hygiene/grooming, and caring for others  PARTICIPATION LIMITATIONS: meal prep, cleaning, laundry, driving, shopping, community activity, and yard work  PERSONAL FACTORS: Age and Time since onset of injury/illness/exacerbation are also affecting patient's functional outcome.   REHAB POTENTIAL: Good  CLINICAL DECISION MAKING: Stable/uncomplicated  EVALUATION COMPLEXITY: Low   GOALS: Goals reviewed with patient? No  SHORT TERM GOALS: Target date: 01/23/2023  patient will be independent with initial HEP Baseline: Goal status: IN PROGRESS  2.   Patient will self report 30% improvement to improve tolerance for functional activity  Baseline:  Goal status: IN PROGRESS   LONG TERM GOALS: Target date: 02/06/2023  Patient will be independent in self management strategies to improve quality of life and functional outcomes.   Baseline:  Goal status: IN PROGRESS  2.   Patient will self report 50% improvement to improve tolerance for functional activity  Baseline:  Goal status: IN PROGRESS  3.  Patient will improve LEFS score to 40/80 to demonstrate improved functional mobility  Baseline: 32/80 Goal status: IN PROGRESS  4.  Patient will improve 5 times sit  to stand score from 44.65  sec to 30 sec to demonstrate improved functional mobility  and increased lower extremity strength Baseline: 44.65 sec Goal status: IN PROGRESS  5.  Patient will increase distance on 2MWT to  325 ft  to demonstrate improved functional mobility walking household and community distances. Baseline: 267 ft Goal status: IN PROGRESS   PLAN:  PT FREQUENCY: 2x/week  PT DURATION: 4 weeks  PLANNED INTERVENTIONS: Therapeutic exercises, Therapeutic activity, Neuromuscular re-education, Balance training, Gait training, Patient/Family education, Joint manipulation, Joint mobilization, Stair training, Orthotic/Fit training, DME instructions, Aquatic Therapy, Dry Needling, Electrical stimulation, Spinal manipulation, Spinal mobilization, Cryotherapy, Moist heat, Compression bandaging, scar mobilization, Splintting, Taping, Traction, Ultrasound, Ionotophoresis '4mg'$ /ml Dexamethasone, and Manual therapy   PLAN FOR NEXT SESSION:  progress hip and core strength as able; address scoliosis, balance; gait; posture.  2:28 PM, 01/29/23 Cyril Railey Small Johnice Riebe MPT [place] [facility] [place] [phone]

## 2023-02-01 ENCOUNTER — Ambulatory Visit (HOSPITAL_COMMUNITY): Payer: PPO | Attending: Orthopedic Surgery

## 2023-02-01 DIAGNOSIS — M79605 Pain in left leg: Secondary | ICD-10-CM | POA: Insufficient documentation

## 2023-02-01 DIAGNOSIS — R262 Difficulty in walking, not elsewhere classified: Secondary | ICD-10-CM | POA: Diagnosis not present

## 2023-02-01 DIAGNOSIS — M25551 Pain in right hip: Secondary | ICD-10-CM | POA: Diagnosis not present

## 2023-02-01 DIAGNOSIS — M545 Low back pain, unspecified: Secondary | ICD-10-CM | POA: Diagnosis not present

## 2023-02-01 NOTE — Therapy (Signed)
OUTPATIENT PHYSICAL THERAPY TREATMENT   Patient Name: Christy Richardson MRN: [id number] DOB:1935/03/08, 88 y.o., female Today's Date: 02/01/2023  END OF SESSION:  PT End of Session - 02/01/23 1347     Visit Number 4    Number of Visits 8    Date for PT Re-Evaluation 02/06/23    Authorization Type [employer]    Authorization Time Period no auth, no VL    PT Start Time 1350    PT Stop Time 1430    PT Time Calculation (min) 40 min    Activity Tolerance Patient tolerated treatment well    Behavior During Therapy WFL for tasks assessed/performed              Past Medical History:  Diagnosis Date   Adenomatous polyp of colon 03/2007   Allergic rhinitis    Anxiety    a.) on BZO (lorazepam) PRN   Arthritis    Blood transfusion 1974   Bronchiectasis ([place])    Cancer ([place])    left arm   Deaf    in right 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 Mild TR; b.) TTE 07/16/2019: EF 60-65%, triv AR, mild MR/TR, G1DD.   Diet-controlled type 2 diabetes mellitus ([place])    Diverticulosis of colon (without mention of hemorrhage)    DOE (dyspnea on exertion)    Dyspnea    Fatigue    Fecal incontinence    GERD (gastroesophageal reflux disease)    Glaucoma    Goiter    right side   Heart murmur    History of stress test    a. 01/2014 Lexi MV: no ischemia/infarct.   Hyperkalemia    Hyperlipidemia    Hypertension    a. 11/2018 Renal artery duplex: No RAS.    Hypothyroidism    IBS (irritable bowel syndrome)    Migraine    Mitral valve prolapse    a. 01/2014 Echo: no MR/MVP noted.   Neck mass    right side/ no airway problems/ per pt has had since 2005 (goiter)   Nonspecific abnormal electrocardiogram (ECG) (EKG)    Palpitations    a. 01/2014 CardioNet: wore for 9 days (rash). Sinus tach - 100's. No SVT/AF.   Pneumonia    Polyp, sigmoid colon    Post-operative nausea and vomiting     Pulmonary fibrosis (HCC)    Pulmonary nodules    Recurrent UTI    Tachycardia    Tremor    Past Surgical History:  Procedure Laterality Date   ABDOMINAL HYSTERECTOMY     CARDIOVASCULAR STRESS TEST  09/19/2011   No scintigraphic evidence of inducible myocardial ischemia. Pharmacological stress test without chest pain or EKG changes for ischemia.   COLONOSCOPY     ESOPHAGOGASTRODUODENOSCOPY     MASTOIDECTOMY     rt ear/ as a teenager   OOPHORECTOMY     SKIN CANCER EXCISION Left    arm   TOTAL HIP ARTHROPLASTY  5/10   left   TOTAL HIP ARTHROPLASTY Right 07/10/2022   Procedure: TOTAL HIP ARTHROPLASTY ANTERIOR APPROACH;  Surgeon: Hessie Knows, MD;  Location: [facility] ORS;  Service: Orthopedics;  Laterality: Right;   TRANSTHORACIC ECHOCARDIOGRAM  09/19/2011   EF 123456, stage 1 diastolic dysfunction, mild tricuspid valve regurg   Patient Active Problem List   Diagnosis Date Noted   S/P total right hip arthroplasty 07/11/2022  Status post total hip replacement, right 07/10/2022   Preop respiratory exam 06/21/2022   Sinus tachycardia 02/04/2018   Hyperlipidemia LDL goal <70 08/16/2015   Left ventricular diastolic dysfunction, NYHA class 1 03/13/2014   Chest pain 01/30/2014   Tremor, essential 05/07/2013   Fecal incontinence 09/30/2012   Pulmonary nodules 05/15/2012   Dyspnea 05/27/2011   Bronchiectasis without acute exacerbation ([place]) 09/26/2009   OSTEOPENIA 03/17/2009   COLONIC POLYPS, ADENOMATOUS, HX OF 09/27/2008   BACK PAIN 09/01/2008   LIPOMA 06/01/2008   UTI'S, RECURRENT 06/10/2007   Hypothyroidism 01/07/2007   Hyperlipidemia 01/07/2007   Anxiety 01/07/2007   DEPRESSION 01/07/2007   MIGRAINE HEADACHE 01/07/2007   GLAUCOMA NEC 01/07/2007   DECREASED HEARING 01/07/2007   Essential hypertension 01/07/2007   Allergic rhinitis 01/07/2007   GERD 01/07/2007   IBS 01/07/2007   OSTEOARTHRITIS 01/07/2007    PCP: Celene Squibb, MD  REFERRING PROVIDER: [place];  Dr. Rudene Christians  REFERRING DIAG: status post toatl replacment of right hip arthritis of right hip chronic bilateral low back pain without sciatica  Rationale for Evaluation and Treatment: Rehabilitation  THERAPY DIAG:  Low back pain, unspecified back pain laterality, unspecified chronicity, unspecified whether sciatica present  Pain in left leg  Difficulty in walking, not elsewhere classified  Pain in right hip  ONSET DATE: 07/10/2022   SUBJECTIVE:                                                                                                                                                                                           SUBJECTIVE STATEMENT: Overall feels about the same; states she is disgusted with her continued pain level.  Also wheezing some today.  Reports she has trouble with her breathing; a chronic condition that will not improve.  Exercises seem to make her hurt more.  Left hip and groin is also bothering her.    Evaluation: Had total hip right back in August.  After surgery BP dropped a lot so was unable to take much therapy or pain medication.  Went to rehab for 3 days; "It was a hell hole".  Came home; developed a blood clot behind her right knee.  Home health came out for about 2 weeks.   " Maybe I did not do as much as I should have".  Has had muscle spasms since surgery and MD gave her Gabapentin that has helped some but " I am in worse shape than I was before I had the surgery".  Reports difficulty with getting up out of a chair; getting in and out of a car.  Using a walker a little; mostly  just to get up; sometimes "I almost fall"; also reports some swelling in her legs.   PERTINENT HISTORY:  S/P right THA 07/10/22 Left THA 2010 2 hernias scoliosis  PAIN:  Are you having pain? Yes: NPRS scale: 7/10 Pain location: right hip and leg to knee Pain description: cramping pulling, weak Aggravating factors: getting up, standing and walking Relieving factors:  rest  PRECAUTIONS: Fall  WEIGHT BEARING RESTRICTIONS: No  FALLS:  Has patient fallen in last 6 months? Yes. Number of falls 0  LIVING ENVIRONMENT: Lives with: lives alone Lives in: House/apartment Stairs: Yes: External: 2 steps; on right going up, on left going up, and can reach both Has following equipment at home: Single point cane, Walker - 2 wheeled, shower chair, bed side commode, Grab bars, and None removeable shower head  OCCUPATION: not working, retired  PLOF: [place]: be able to get up and move around without pain; go to grocery and do my housework  NEXT MD VISIT: 4 weeks or after therapy  OBJECTIVE:   DIAGNOSTIC FINDINGS:  CLINICAL DATA:  Mid to lower back pain radiates to the left hip. Left buttock and left leg pain.   EXAM: MRI PELVIS WITHOUT CONTRAST   TECHNIQUE: Multiplanar multisequence MR imaging of the pelvis was performed. No intravenous contrast was administered.   COMPARISON:  CT examination dated March 20, 2018   FINDINGS: Urinary Tract:  No abnormality visualized.   Bowel:  Unremarkable visualized pelvic bowel loops.   Vascular/Lymphatic: No pathologically enlarged lymph nodes. No significant vascular abnormality seen.   Reproductive:  No mass or other significant abnormality   Other: Trace amount of fluid in the left inguinal region suspicious for small left femoral hernia.   Musculoskeletal: Status post bilateral hip arthroplasty. No appreciable joint effusion. No perihardware loosening or edema to suggest acute fracture. Susceptibility artifacts limits evaluation.   Bilateral hamstring origin tendinopathy. Generalized muscle atrophy. No intramuscular hematoma or fluid collection. No evidence of bursitis.   IMPRESSION: 1. Trace amount of fluid in the left inguinal region suspicious for small left femoral hernia. 2. Status post bilateral hip arthroplasty. No appreciable joint effusion. No perihardware loosening  or edema to suggest acute fracture. 3. Bilateral hamstring origin tendinopathy.     Electronically Signed   By: Keane Police D.O.   On: 11/30/2022 23:27  CLINICAL DATA:  Mid to low back pain radiating to the left hip, buttock and leg.   EXAM: MRI LUMBAR SPINE WITHOUT CONTRAST   TECHNIQUE: Multiplanar, multisequence MR imaging of the lumbar spine was performed. No intravenous contrast was administered.   COMPARISON:  None Available.   FINDINGS: Segmentation:  5 lumbar type vertebral bodies.   Alignment: Scoliotic curvature convex to the right with the apex at L3.   Vertebrae: No fracture or focal bone lesion. No edematous marrow changes or edematous facet arthritis.   Conus medullaris and cauda equina: Conus extends to the L1 level. Conus and cauda equina appear normal.   Paraspinal and other soft tissues: Negative   Disc levels:   T12-L1: Chronic disc degeneration with endplate osteophytes and bulging of the disc more towards the right. Mild right foraminal narrowing, not likely compressive.   L1-2: Disc degeneration with loss of disc height. Endplate osteophytes and minimal bulging of the disc. Mild facet osteoarthritis. No compressive stenosis.   L2-3: Disc degeneration with desiccation and loss of height. Endplate osteophytes and bulging of the disc slightly more towards the left. Mild facet and ligamentous hypertrophy on  the left. Mild narrowing of the left lateral recess but no likely neural compression.   L3-4: Endplate osteophytes and bulging of the disc slightly more prominent towards the left. Mild facet and ligamentous hypertrophy. Mild narrowing of the left lateral recess but no likely neural compression.   L4-5: Mild bulging of the disc more prominent towards the right. Mild facet and ligamentous hypertrophy. Mild narrowing of the right lateral recess but no likely neural compression.   L5-S1: Minimal bulging of the disc more towards the right.  No compressive canal or foraminal narrowing.   IMPRESSION: 1. Scoliotic curvature convex to the right with the apex at L3. 2. Chronic degenerative disc disease from T12-L1 through L5-S1. No apparent compressive stenosis of the canal or foramina. Mild lateral recess narrowing on the left at L2-3 and L3-4 and on the right at L4-5. I do not see an abnormality to clearly explain the patient's left-sided radicular presentation.     Electronically Signed   By: Nelson Chimes M.D.   On: 12/01/2022 09:29    PATIENT SURVEYS:  LEFS 32/80   COGNITION: Overall cognitive status: Within functional limits for tasks assessed     SENSATION: WFL   POSTURE: rounded shoulders, forward head, and flexed trunk   PALPATION: Tenderness along spine; noted left hip elevation versus right; right trunk shift; concave spinal curve to the right lumbar spine  LUMBAR ROM:   AROM eval  Flexion 50% available  Extension 10% available  Right lateral flexion   Left lateral flexion   Right rotation   Left rotation    (Blank rows = not tested)  LOWER EXTREMITY ROM:     Active  Right eval Left eval  Hip flexion 62 80  Hip extension    Hip abduction    Hip adduction    Hip internal rotation    Hip external rotation    Knee flexion    Knee extension  -12 *  Ankle dorsiflexion    Ankle plantarflexion    Ankle inversion    Ankle eversion     (Blank rows = not tested)  LOWER EXTREMITY MMT:    MMT Right eval Left eval  Hip flexion 3+ 4  Hip extension    Hip abduction    Hip adduction    Hip internal rotation    Hip external rotation    Knee flexion    Knee extension 4+ 5  Ankle dorsiflexion 4+ 5  Ankle plantarflexion    Ankle inversion    Ankle eversion     (Blank rows = not tested)   FUNCTIONAL TESTS:  5 times sit to stand: 44.65 sec 2 minute walk test: 267 ft  GAIT: Distance walked: 267 Assistive device utilized: None Level of assistance: Modified independence Comments:  left foot internal rotation; right trunk shift  TODAY'S TREATMENT:  DATE:  02/01/2023 Supine: Moist heat to right low back and neck x 5' in supine to decrease pain and increase soft tissue extensibility LTR x 2' Leg lengtheners 5" x 8 Leg press 5" x 8 Hip adduction with ball x 10 x 5" hold Hip abduction with belt 5" hold x 10     01/29/23 Supine: Moist heat to right low back and glute while in supine LTR x 15 Leg lengtheners 5" hold x 8 Leg press 5" hold x 8  Seated: Scapular retraction x 8  Standing: Heel raises x 10 Hip abduction x 10 Hip extension x 10 Anterior hip stretch (right foot back and raise right arm) x 8   01/23/23 Goal review, HEP review Nustep 5 minutes level 2 UE/LE seat 10 Standing hip excursions 5X each  Corner stretch 3X20"  Seated scap retractions 10X"  Postural corrections  Rt OH reach to Lt 10X  Sit to stands no UE 10X Supine bridge 15X5"  Trunk rotations 10X each  SLR 10X each  01/08/2023 physical therapy evaluation and HEP instruction    PATIENT EDUCATION:  Education details: Patient educated on exam findings, POC, scope of PT, HEP, and what to expect next visit. Person educated: Patient Education method: Explanation, Demonstration, and Handouts Education comprehension: verbalized understanding, returned demonstration, verbal cues required, and tactile cues required   HOME EXERCISE PROGRAM: 01/29/23 leg press and leg lengthener Access Code: [email or web address] URL: [email or web address] Date: 01/09/2023 Prepared by: [facility] - Rehab  Exercises - Sit to Stand  - 2 x daily - 7 x weekly - 2 sets - 5 reps - right arm and reach overhead to the left  - 2 x daily - 7 x weekly - 1 sets - 10 reps - 2 sec hold - Supine Quad Set  - 2 x daily - 7 x weekly - 1 sets - 10 reps - 5 sec hold - Supine Bridge  - 2 x daily - 7 x  weekly - 1 sets - 10 reps  ASSESSMENT:  CLINICAL IMPRESSION: Today's session addressed core and postural strengthening. Patient today with increased pain with activity; modified activity accordingly.  Patient reports left groin pain and right lateral hip pain with leg lengthener.   Added hip abduction and hip adduction isometric without issue and added to HEP.  Patient still with difficulty with sit to stand ; has to use hands to assist and initially has trouble fully extending hips.  Discussed fall prevention and home safety with patient encouraging her to consider a cell phone or life alert as she live alone.   Patient will benefit from skilled physical therapy services to address these deficits to reduce pain and improve level of function with ADLs and functional mobility tasks.   OBJECTIVE IMPAIRMENTS: Abnormal gait, decreased activity tolerance, decreased balance, decreased endurance, decreased mobility, difficulty walking, decreased ROM, decreased strength, hypomobility, increased edema, increased fascial restrictions, impaired perceived functional ability, increased muscle spasms, impaired flexibility, and pain.   ACTIVITY LIMITATIONS: carrying, lifting, bending, sitting, standing, squatting, sleeping, stairs, transfers, bed mobility, bathing, toileting, hygiene/grooming, and caring for others  PARTICIPATION LIMITATIONS: meal prep, cleaning, laundry, driving, shopping, community activity, and yard work  PERSONAL FACTORS: Age and Time since onset of injury/illness/exacerbation are also affecting patient's functional outcome.   REHAB POTENTIAL: Good  CLINICAL DECISION MAKING: Stable/uncomplicated  EVALUATION COMPLEXITY: Low   GOALS: Goals reviewed with patient? No  SHORT TERM GOALS: Target date: 01/23/2023  patient will be independent with initial HEP Baseline: Goal status: IN  PROGRESS  2.   Patient will self report 30% improvement to improve tolerance for functional  activity  Baseline:  Goal status: IN PROGRESS   LONG TERM GOALS: Target date: 02/06/2023  Patient will be independent in self management strategies to improve quality of life and functional outcomes.   Baseline:  Goal status: IN PROGRESS  2.   Patient will self report 50% improvement to improve tolerance for functional activity  Baseline:  Goal status: IN PROGRESS  3.  Patient will improve LEFS score to 40/80 to demonstrate improved functional mobility  Baseline: 32/80 Goal status: IN PROGRESS  4.  Patient will improve 5 times sit to stand score from 44.65  sec to 30 sec to demonstrate improved functional mobility and increased lower extremity strength Baseline: 44.65 sec Goal status: IN PROGRESS  5.  Patient will increase distance on 2MWT to  325 ft  to demonstrate improved functional mobility walking household and community distances. Baseline: 267 ft Goal status: IN PROGRESS   PLAN:  PT FREQUENCY: 2x/week  PT DURATION: 4 weeks  PLANNED INTERVENTIONS: Therapeutic exercises, Therapeutic activity, Neuromuscular re-education, Balance training, Gait training, Patient/Family education, Joint manipulation, Joint mobilization, Stair training, Orthotic/Fit training, DME instructions, Aquatic Therapy, Dry Needling, Electrical stimulation, Spinal manipulation, Spinal mobilization, Cryotherapy, Moist heat, Compression bandaging, scar mobilization, Splintting, Taping, Traction, Ultrasound, Ionotophoresis '4mg'$ /ml Dexamethasone, and Manual therapy   PLAN FOR NEXT SESSION:  progress hip and core strength as able; address scoliosis, balance; gait; posture.  2:42 PM, 02/01/23 Lilymarie Scroggins Small Mariaha Ellington MPT [facility] [place] [phone]

## 2023-02-04 ENCOUNTER — Other Ambulatory Visit: Payer: Self-pay | Admitting: Nurse Practitioner

## 2023-02-04 ENCOUNTER — Ambulatory Visit (HOSPITAL_COMMUNITY): Payer: PPO

## 2023-02-04 DIAGNOSIS — R262 Difficulty in walking, not elsewhere classified: Secondary | ICD-10-CM

## 2023-02-04 DIAGNOSIS — M79605 Pain in left leg: Secondary | ICD-10-CM

## 2023-02-04 DIAGNOSIS — M25551 Pain in right hip: Secondary | ICD-10-CM

## 2023-02-04 DIAGNOSIS — M545 Low back pain, unspecified: Secondary | ICD-10-CM

## 2023-02-04 NOTE — Therapy (Signed)
OUTPATIENT PHYSICAL THERAPY TREATMENT   Patient Name: Christy Richardson MRN: [id number] DOB:May 07, 1935, 88 y.o., female Today's Date: 02/04/2023  END OF SESSION:  PT End of Session - 02/04/23 1347     Visit Number 5    Number of Visits 8    Date for PT Re-Evaluation 02/06/23    Authorization Type [employer]    Authorization Time Period no auth, no VL    PT Start Time 1347    PT Stop Time 1430    PT Time Calculation (min) 43 min    Activity Tolerance Patient tolerated treatment well    Behavior During Therapy WFL for tasks assessed/performed              Past Medical History:  Diagnosis Date   Adenomatous polyp of colon 03/2007   Allergic rhinitis    Anxiety    a.) on BZO (lorazepam) PRN   Arthritis    Blood transfusion 1974   Bronchiectasis ([place])    Cancer ([place])    left arm   Deaf    in right 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 Mild TR; b.) TTE 07/16/2019: EF 60-65%, triv AR, mild MR/TR, G1DD.   Diet-controlled type 2 diabetes mellitus ([place])    Diverticulosis of colon (without mention of hemorrhage)    DOE (dyspnea on exertion)    Dyspnea    Fatigue    Fecal incontinence    GERD (gastroesophageal reflux disease)    Glaucoma    Goiter    right side   Heart murmur    History of stress test    a. 01/2014 Lexi MV: no ischemia/infarct.   Hyperkalemia    Hyperlipidemia    Hypertension    a. 11/2018 Renal artery duplex: No RAS.    Hypothyroidism    IBS (irritable bowel syndrome)    Migraine    Mitral valve prolapse    a. 01/2014 Echo: no MR/MVP noted.   Neck mass    right side/ no airway problems/ per pt has had since 2005 (goiter)   Nonspecific abnormal electrocardiogram (ECG) (EKG)    Palpitations    a. 01/2014 CardioNet: wore for 9 days (rash). Sinus tach - 100's. No SVT/AF.   Pneumonia    Polyp, sigmoid colon    Post-operative nausea and vomiting     Pulmonary fibrosis (HCC)    Pulmonary nodules    Recurrent UTI    Tachycardia    Tremor    Past Surgical History:  Procedure Laterality Date   ABDOMINAL HYSTERECTOMY     CARDIOVASCULAR STRESS TEST  09/19/2011   No scintigraphic evidence of inducible myocardial ischemia. Pharmacological stress test without chest pain or EKG changes for ischemia.   COLONOSCOPY     ESOPHAGOGASTRODUODENOSCOPY     MASTOIDECTOMY     rt ear/ as a teenager   OOPHORECTOMY     SKIN CANCER EXCISION Left    arm   TOTAL HIP ARTHROPLASTY  5/10   left   TOTAL HIP ARTHROPLASTY Right 07/10/2022   Procedure: TOTAL HIP ARTHROPLASTY ANTERIOR APPROACH;  Surgeon: Hessie Knows, MD;  Location: [facility] ORS;  Service: Orthopedics;  Laterality: Right;   TRANSTHORACIC ECHOCARDIOGRAM  09/19/2011   EF 123456, stage 1 diastolic dysfunction, mild tricuspid valve regurg   Patient Active Problem List   Diagnosis Date Noted   S/P total right hip arthroplasty 07/11/2022  Status post total hip replacement, right 07/10/2022   Preop respiratory exam 06/21/2022   Sinus tachycardia 02/04/2018   Hyperlipidemia LDL goal <70 08/16/2015   Left ventricular diastolic dysfunction, NYHA class 1 03/13/2014   Chest pain 01/30/2014   Tremor, essential 05/07/2013   Fecal incontinence 09/30/2012   Pulmonary nodules 05/15/2012   Dyspnea 05/27/2011   Bronchiectasis without acute exacerbation ([place]) 09/26/2009   OSTEOPENIA 03/17/2009   COLONIC POLYPS, ADENOMATOUS, HX OF 09/27/2008   BACK PAIN 09/01/2008   LIPOMA 06/01/2008   UTI'S, RECURRENT 06/10/2007   Hypothyroidism 01/07/2007   Hyperlipidemia 01/07/2007   Anxiety 01/07/2007   DEPRESSION 01/07/2007   MIGRAINE HEADACHE 01/07/2007   GLAUCOMA NEC 01/07/2007   DECREASED HEARING 01/07/2007   Essential hypertension 01/07/2007   Allergic rhinitis 01/07/2007   GERD 01/07/2007   IBS 01/07/2007   OSTEOARTHRITIS 01/07/2007    PCP: Celene Squibb, MD  REFERRING PROVIDER: [place];  Dr. Rudene Christians  REFERRING DIAG: status post toatl replacment of right hip arthritis of right hip chronic bilateral low back pain without sciatica  Rationale for Evaluation and Treatment: Rehabilitation  THERAPY DIAG:  Low back pain, unspecified back pain laterality, unspecified chronicity, unspecified whether sciatica present  Pain in left leg  Difficulty in walking, not elsewhere classified  Pain in right hip  ONSET DATE: 07/10/2022   SUBJECTIVE:                                                                                                                                                                                           SUBJECTIVE STATEMENT: Right hip is still bothering her quite a bit; frustrated with continued symptoms; pain she has in the right hip and thigh are present only since surgery.     Evaluation: Had total hip right back in August.  After surgery BP dropped a lot so was unable to take much therapy or pain medication.  Went to rehab for 3 days; "It was a hell hole".  Came home; developed a blood clot behind her right knee.  Home health came out for about 2 weeks.   " Maybe I did not do as much as I should have".  Has had muscle spasms since surgery and MD gave her Gabapentin that has helped some but " I am in worse shape than I was before I had the surgery".  Reports difficulty with getting up out of a chair; getting in and out of a car.  Using a walker a little; mostly just to get up; sometimes "I almost fall"; also reports some swelling in her legs.   PERTINENT HISTORY:  S/P right THA  07/10/22 Left THA 2010 2 hernias scoliosis  PAIN:  Are you having pain? Yes: NPRS scale: 7/10 Pain location: right hip and leg to knee Pain description: cramping pulling, weak Aggravating factors: getting up, standing and walking Relieving factors: rest  PRECAUTIONS: Fall  WEIGHT BEARING RESTRICTIONS: No  FALLS:  Has patient fallen in last 6 months? Yes. Number of falls  0  LIVING ENVIRONMENT: Lives with: lives alone Lives in: House/apartment Stairs: Yes: External: 2 steps; on right going up, on left going up, and can reach both Has following equipment at home: Single point cane, Walker - 2 wheeled, shower chair, bed side commode, Grab bars, and None removeable shower head  OCCUPATION: not working, retired  PLOF: [place]: be able to get up and move around without pain; go to grocery and do my housework  NEXT MD VISIT: 4 weeks or after therapy  OBJECTIVE:   DIAGNOSTIC FINDINGS:  CLINICAL DATA:  Mid to lower back pain radiates to the left hip. Left buttock and left leg pain.   EXAM: MRI PELVIS WITHOUT CONTRAST   TECHNIQUE: Multiplanar multisequence MR imaging of the pelvis was performed. No intravenous contrast was administered.   COMPARISON:  CT examination dated March 20, 2018   FINDINGS: Urinary Tract:  No abnormality visualized.   Bowel:  Unremarkable visualized pelvic bowel loops.   Vascular/Lymphatic: No pathologically enlarged lymph nodes. No significant vascular abnormality seen.   Reproductive:  No mass or other significant abnormality   Other: Trace amount of fluid in the left inguinal region suspicious for small left femoral hernia.   Musculoskeletal: Status post bilateral hip arthroplasty. No appreciable joint effusion. No perihardware loosening or edema to suggest acute fracture. Susceptibility artifacts limits evaluation.   Bilateral hamstring origin tendinopathy. Generalized muscle atrophy. No intramuscular hematoma or fluid collection. No evidence of bursitis.   IMPRESSION: 1. Trace amount of fluid in the left inguinal region suspicious for small left femoral hernia. 2. Status post bilateral hip arthroplasty. No appreciable joint effusion. No perihardware loosening or edema to suggest acute fracture. 3. Bilateral hamstring origin tendinopathy.     Electronically Signed   By: Keane Police  D.O.   On: 11/30/2022 23:27  CLINICAL DATA:  Mid to low back pain radiating to the left hip, buttock and leg.   EXAM: MRI LUMBAR SPINE WITHOUT CONTRAST   TECHNIQUE: Multiplanar, multisequence MR imaging of the lumbar spine was performed. No intravenous contrast was administered.   COMPARISON:  None Available.   FINDINGS: Segmentation:  5 lumbar type vertebral bodies.   Alignment: Scoliotic curvature convex to the right with the apex at L3.   Vertebrae: No fracture or focal bone lesion. No edematous marrow changes or edematous facet arthritis.   Conus medullaris and cauda equina: Conus extends to the L1 level. Conus and cauda equina appear normal.   Paraspinal and other soft tissues: Negative   Disc levels:   T12-L1: Chronic disc degeneration with endplate osteophytes and bulging of the disc more towards the right. Mild right foraminal narrowing, not likely compressive.   L1-2: Disc degeneration with loss of disc height. Endplate osteophytes and minimal bulging of the disc. Mild facet osteoarthritis. No compressive stenosis.   L2-3: Disc degeneration with desiccation and loss of height. Endplate osteophytes and bulging of the disc slightly more towards the left. Mild facet and ligamentous hypertrophy on the left. Mild narrowing of the left lateral recess but no likely neural compression.   L3-4: Endplate osteophytes and bulging of the  disc slightly more prominent towards the left. Mild facet and ligamentous hypertrophy. Mild narrowing of the left lateral recess but no likely neural compression.   L4-5: Mild bulging of the disc more prominent towards the right. Mild facet and ligamentous hypertrophy. Mild narrowing of the right lateral recess but no likely neural compression.   L5-S1: Minimal bulging of the disc more towards the right. No compressive canal or foraminal narrowing.   IMPRESSION: 1. Scoliotic curvature convex to the right with the apex at L3. 2.  Chronic degenerative disc disease from T12-L1 through L5-S1. No apparent compressive stenosis of the canal or foramina. Mild lateral recess narrowing on the left at L2-3 and L3-4 and on the right at L4-5. I do not see an abnormality to clearly explain the patient's left-sided radicular presentation.     Electronically Signed   By: Nelson Chimes M.D.   On: 12/01/2022 09:29    PATIENT SURVEYS:  LEFS 32/80   COGNITION: Overall cognitive status: Within functional limits for tasks assessed     SENSATION: WFL   POSTURE: rounded shoulders, forward head, and flexed trunk   PALPATION: Tenderness along spine; noted left hip elevation versus right; right trunk shift; concave spinal curve to the right lumbar spine  LUMBAR ROM:   AROM eval  Flexion 50% available  Extension 10% available  Right lateral flexion   Left lateral flexion   Right rotation   Left rotation    (Blank rows = not tested)  LOWER EXTREMITY ROM:     Active  Right eval Left eval  Hip flexion 62 80  Hip extension    Hip abduction    Hip adduction    Hip internal rotation    Hip external rotation    Knee flexion    Knee extension  -12 *  Ankle dorsiflexion    Ankle plantarflexion    Ankle inversion    Ankle eversion     (Blank rows = not tested)  LOWER EXTREMITY MMT:    MMT Right eval Left eval  Hip flexion 3+ 4  Hip extension    Hip abduction    Hip adduction    Hip internal rotation    Hip external rotation    Knee flexion    Knee extension 4+ 5  Ankle dorsiflexion 4+ 5  Ankle plantarflexion    Ankle inversion    Ankle eversion     (Blank rows = not tested)   FUNCTIONAL TESTS:  5 times sit to stand: 44.65 sec 2 minute walk test: 267 ft  GAIT: Distance walked: 267 Assistive device utilized: None Level of assistance: Modified independence Comments: left foot internal rotation; right trunk shift  TODAY'S TREATMENT:                                                                                                                               DATE:  02/04/23 Supine: Moist heat x 5' to decrease pain and  improve tissue extensibility (all exercise done while patient on heat) LTR x 15 Hip abduction with belt 5" hold x 10 Hip adduction with ball 5" hold x 10  Sitting: Scapular retractions 2 x 10 Thoracic extensions (caused some increased neck pain) Hamstring stretch with strap 10 x 10"  Standing: Hip abduction 2 x 10 Heel raises x 10 Hip extension 2 x 10    02/01/2023 Supine: Moist heat to right low back and neck x 5' in supine to decrease pain and increase soft tissue extensibility LTR x 2' Leg lengtheners 5" x 8 Leg press 5" x 8 Hip adduction with ball x 10 x 5" hold Hip abduction with belt 5" hold x 10     01/29/23 Supine: Moist heat to right low back and glute while in supine LTR x 15 Leg lengtheners 5" hold x 8 Leg press 5" hold x 8  Seated: Scapular retraction x 8  Standing: Heel raises x 10 Hip abduction x 10 Hip extension x 10 Anterior hip stretch (right foot back and raise right arm) x 8   01/23/23 Goal review, HEP review Nustep 5 minutes level 2 UE/LE seat 10 Standing hip excursions 5X each  Corner stretch 3X20"  Seated scap retractions 10X"  Postural corrections  Rt OH reach to Lt 10X  Sit to stands no UE 10X Supine bridge 15X5"  Trunk rotations 10X each  SLR 10X each  01/08/2023 physical therapy evaluation and HEP instruction    PATIENT EDUCATION:  Education details: Patient educated on exam findings, POC, scope of PT, HEP, and what to expect next visit. Person educated: Patient Education method: Explanation, Demonstration, and Handouts Education comprehension: verbalized understanding, returned demonstration, verbal cues required, and tactile cues required   HOME EXERCISE PROGRAM: 01/29/23 leg press and leg lengthener Access Code: [email or web address] URL: [email or web address] Date: 01/09/2023 Prepared by: AP -  Rehab  Exercises - Sit to Stand  - 2 x daily - 7 x weekly - 2 sets - 5 reps - right arm and reach overhead to the left  - 2 x daily - 7 x weekly - 1 sets - 10 reps - 2 sec hold - Supine Quad Set  - 2 x daily - 7 x weekly - 1 sets - 10 reps - 5 sec hold - Supine Bridge  - 2 x daily - 7 x weekly - 1 sets - 10 reps  ASSESSMENT:  CLINICAL IMPRESSION: Today's session addressed core and postural strengthening. Resumed standing exercise today without issue. Trial of adding thoracic extensions today but this caused her some neck pain do did not add to her HEP.  Patient continues with postural impairments and when encouraged to stand tall she states she has had scoliosis and back problems for years and years.    Patient will benefit from skilled physical therapy services to address these deficits to reduce pain and improve level of function with ADLs and functional mobility tasks.   OBJECTIVE IMPAIRMENTS: Abnormal gait, decreased activity tolerance, decreased balance, decreased endurance, decreased mobility, difficulty walking, decreased ROM, decreased strength, hypomobility, increased edema, increased fascial restrictions, impaired perceived functional ability, increased muscle spasms, impaired flexibility, and pain.   ACTIVITY LIMITATIONS: carrying, lifting, bending, sitting, standing, squatting, sleeping, stairs, transfers, bed mobility, bathing, toileting, hygiene/grooming, and caring for others  PARTICIPATION LIMITATIONS: meal prep, cleaning, laundry, driving, shopping, community activity, and yard work  PERSONAL FACTORS: Age and Time since onset of injury/illness/exacerbation are also affecting patient's functional outcome.   REHAB POTENTIAL: Good  CLINICAL  DECISION MAKING: Stable/uncomplicated  EVALUATION COMPLEXITY: Low   GOALS: Goals reviewed with patient? No  SHORT TERM GOALS: Target date: 01/23/2023  patient will be independent with initial HEP Baseline: Goal status: IN  PROGRESS  2.   Patient will self report 30% improvement to improve tolerance for functional activity  Baseline:  Goal status: IN PROGRESS   LONG TERM GOALS: Target date: 02/06/2023  Patient will be independent in self management strategies to improve quality of life and functional outcomes.   Baseline:  Goal status: IN PROGRESS  2.   Patient will self report 50% improvement to improve tolerance for functional activity  Baseline:  Goal status: IN PROGRESS  3.  Patient will improve LEFS score to 40/80 to demonstrate improved functional mobility  Baseline: 32/80 Goal status: IN PROGRESS  4.  Patient will improve 5 times sit to stand score from 44.65  sec to 30 sec to demonstrate improved functional mobility and increased lower extremity strength Baseline: 44.65 sec Goal status: IN PROGRESS  5.  Patient will increase distance on 2MWT to  325 ft  to demonstrate improved functional mobility walking household and community distances. Baseline: 267 ft Goal status: IN PROGRESS   PLAN:  PT FREQUENCY: 2x/week  PT DURATION: 4 weeks  PLANNED INTERVENTIONS: Therapeutic exercises, Therapeutic activity, Neuromuscular re-education, Balance training, Gait training, Patient/Family education, Joint manipulation, Joint mobilization, Stair training, Orthotic/Fit training, DME instructions, Aquatic Therapy, Dry Needling, Electrical stimulation, Spinal manipulation, Spinal mobilization, Cryotherapy, Moist heat, Compression bandaging, scar mobilization, Splintting, Taping, Traction, Ultrasound, Ionotophoresis '4mg'$ /ml Dexamethasone, and Manual therapy   PLAN FOR NEXT SESSION:  progress hip and core strength as able; address scoliosis, balance; gait; posture. Reassess next visit  2:30 PM, 02/04/23 Raechell Singleton Small Wynne Rozak MPT [place] [facility] [place] [phone]

## 2023-02-04 NOTE — Telephone Encounter (Signed)
Please contact patient for overdue 1 month f/u, last seen 09-27-22. Please schedule with [place] or APP.  Refill pending appt.

## 2023-02-05 NOTE — Telephone Encounter (Signed)
Pt is scheduled on 6/17

## 2023-02-06 ENCOUNTER — Telehealth: Payer: Self-pay | Admitting: Cardiovascular Disease

## 2023-02-06 ENCOUNTER — Ambulatory Visit (HOSPITAL_COMMUNITY): Payer: PPO

## 2023-02-06 DIAGNOSIS — R262 Difficulty in walking, not elsewhere classified: Secondary | ICD-10-CM

## 2023-02-06 DIAGNOSIS — M545 Low back pain, unspecified: Secondary | ICD-10-CM | POA: Diagnosis not present

## 2023-02-06 DIAGNOSIS — M25551 Pain in right hip: Secondary | ICD-10-CM

## 2023-02-06 DIAGNOSIS — M79605 Pain in left leg: Secondary | ICD-10-CM

## 2023-02-06 NOTE — Telephone Encounter (Signed)
*  STAT* If patient is at the pharmacy, call can be transferred to refill team.   1. Which medications need to be refilled? (please list name of each medication and dose if known) carvedilol (COREG) 12.5 MG tablet   2. Which pharmacy/location (including street and city if local pharmacy) is medication to be sent to?  [place]    3. Do they need a 30 day or 90 day supply? 90   Patient has appt on 05/07/23

## 2023-02-06 NOTE — Telephone Encounter (Signed)
I called the pharmacy to inquire about the prescription for Carvedilol that was sent in yesterday for the patient. The pharmacy staff stated that they didn't have enough tablets to give her so they gave her twenty tablets. They state that they will give her the rest of medication tomorrow. I call the patient and relayed the message to her. The patient verbalized understating.

## 2023-02-06 NOTE — Therapy (Signed)
OUTPATIENT PHYSICAL THERAPY TREATMENT/PROGRESS NOTE/ DISCHARGE NOTE Progress Note Reporting Period 01/09/2023 to 02/06/2023  See note below for Objective Data and Assessment of Progress/Goals.   PHYSICAL THERAPY DISCHARGE SUMMARY  Visits from Start of Care: 6  Current functional level related to goals / functional outcomes: See below   Remaining deficits: See below   Education / Equipment: See below   Patient agrees to discharge. Patient goals were partially met. Patient is being discharged due to lack of progress.       Patient Name: Christy Richardson MRN: [id number] DOB:08/25/1935, 88 y.o., female Today's Date: 02/06/2023  END OF SESSION:  PT End of Session - 02/06/23 1346     Visit Number 6    Number of Visits 8    Date for PT Re-Evaluation 02/06/23    Authorization Type [employer]    Authorization Time Period no auth, no VL    PT Start Time 1345    PT Stop Time 1425    PT Time Calculation (min) 40 min    Activity Tolerance Patient tolerated treatment well    Behavior During Therapy WFL for tasks assessed/performed              Past Medical History:  Diagnosis Date   Adenomatous polyp of colon 03/2007   Allergic rhinitis    Anxiety    a.) on BZO (lorazepam) PRN   Arthritis    Blood transfusion 1974   Bronchiectasis ([place])    Cancer ([place])    left arm   Deaf    in right 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 Mild TR; b.) TTE 07/16/2019: EF 60-65%, triv AR, mild MR/TR, G1DD.   Diet-controlled type 2 diabetes mellitus ([place])    Diverticulosis of colon (without mention of hemorrhage)    DOE (dyspnea on exertion)    Dyspnea    Fatigue    Fecal incontinence    GERD (gastroesophageal reflux disease)    Glaucoma    Goiter    right side   Heart murmur    History of stress test    a. 01/2014 Lexi MV: no ischemia/infarct.   Hyperkalemia     Hyperlipidemia    Hypertension    a. 11/2018 Renal artery duplex: No RAS.    Hypothyroidism    IBS (irritable bowel syndrome)    Migraine    Mitral valve prolapse    a. 01/2014 Echo: no MR/MVP noted.   Neck mass    right side/ no airway problems/ per pt has had since 2005 (goiter)   Nonspecific abnormal electrocardiogram (ECG) (EKG)    Palpitations    a. 01/2014 CardioNet: wore for 9 days (rash). Sinus tach - 100's. No SVT/AF.   Pneumonia    Polyp, sigmoid colon    Post-operative nausea and vomiting    Pulmonary fibrosis (HCC)    Pulmonary nodules    Recurrent UTI    Tachycardia    Tremor    Past Surgical History:  Procedure Laterality Date   ABDOMINAL HYSTERECTOMY     CARDIOVASCULAR STRESS TEST  09/19/2011   No scintigraphic evidence of inducible myocardial ischemia. Pharmacological stress test without chest pain or EKG changes for ischemia.   COLONOSCOPY     ESOPHAGOGASTRODUODENOSCOPY     MASTOIDECTOMY     rt ear/ as a teenager   OOPHORECTOMY     SKIN CANCER  EXCISION Left    arm   TOTAL HIP ARTHROPLASTY  5/10   left   TOTAL HIP ARTHROPLASTY Right 07/10/2022   Procedure: TOTAL HIP ARTHROPLASTY ANTERIOR APPROACH;  Surgeon: Hessie Knows, MD;  Location: [facility] ORS;  Service: Orthopedics;  Laterality: Right;   TRANSTHORACIC ECHOCARDIOGRAM  09/19/2011   EF 123456, stage 1 diastolic dysfunction, mild tricuspid valve regurg   Patient Active Problem List   Diagnosis Date Noted   S/P total right hip arthroplasty 07/11/2022   Status post total hip replacement, right 07/10/2022   Preop respiratory exam 06/21/2022   Sinus tachycardia 02/04/2018   Hyperlipidemia LDL goal <70 08/16/2015   Left ventricular diastolic dysfunction, NYHA class 1 03/13/2014   Chest pain 01/30/2014   Tremor, essential 05/07/2013   Fecal incontinence 09/30/2012   Pulmonary nodules 05/15/2012   Dyspnea 05/27/2011   Bronchiectasis without acute exacerbation ([place]) 09/26/2009   OSTEOPENIA 03/17/2009   COLONIC  POLYPS, ADENOMATOUS, HX OF 09/27/2008   BACK PAIN 09/01/2008   LIPOMA 06/01/2008   UTI'S, RECURRENT 06/10/2007   Hypothyroidism 01/07/2007   Hyperlipidemia 01/07/2007   Anxiety 01/07/2007   DEPRESSION 01/07/2007   MIGRAINE HEADACHE 01/07/2007   GLAUCOMA NEC 01/07/2007   DECREASED HEARING 01/07/2007   Essential hypertension 01/07/2007   Allergic rhinitis 01/07/2007   GERD 01/07/2007   IBS 01/07/2007   OSTEOARTHRITIS 01/07/2007    PCP: Celene Squibb, MD  REFERRING PROVIDER: [place]; Dr. Rudene Christians  REFERRING DIAG: status post toatl replacment of right hip arthritis of right hip chronic bilateral low back pain without sciatica  Rationale for Evaluation and Treatment: Rehabilitation  THERAPY DIAG:  Low back pain, unspecified back pain laterality, unspecified chronicity, unspecified whether sciatica present  Pain in left leg  Difficulty in walking, not elsewhere classified  Pain in right hip  ONSET DATE: 07/10/2022   SUBJECTIVE:                                                                                                                                                                                           SUBJECTIVE STATEMENT: Still frustrated with continued pain; whole left leg is really hurting since last visit on Monday; overall maybe 10 % better; little improvement; 7/10 pain with standing and walking.    Evaluation: Had total hip right back in August.  After surgery BP dropped a lot so was unable to take much therapy or pain medication.  Went to rehab for 3 days; "It was a hell hole".  Came home; developed a blood clot behind her right knee.  Home health came out for about 2 weeks.   " Maybe I did  not do as much as I should have".  Has had muscle spasms since surgery and MD gave her Gabapentin that has helped some but " I am in worse shape than I was before I had the surgery".  Reports difficulty with getting up out of a chair; getting in and out of a car.  Using a  walker a little; mostly just to get up; sometimes "I almost fall"; also reports some swelling in her legs.   PERTINENT HISTORY:  S/P right THA 07/10/22 Left THA 2010 2 hernias scoliosis  PAIN:  Are you having pain? Yes: NPRS scale: 7/10 Pain location: right hip and leg to knee Pain description: cramping pulling, weak Aggravating factors: getting up, standing and walking Relieving factors: rest  PRECAUTIONS: Fall  WEIGHT BEARING RESTRICTIONS: No  FALLS:  Has patient fallen in last 6 months? Yes. Number of falls 0  LIVING ENVIRONMENT: Lives with: lives alone Lives in: House/apartment Stairs: Yes: External: 2 steps; on right going up, on left going up, and can reach both Has following equipment at home: Single point cane, Walker - 2 wheeled, shower chair, bed side commode, Grab bars, and None removeable shower head  OCCUPATION: not working, retired  PLOF: [place]: be able to get up and move around without pain; go to grocery and do my housework  NEXT MD VISIT: 4 weeks or after therapy  OBJECTIVE:   DIAGNOSTIC FINDINGS:  CLINICAL DATA:  Mid to lower back pain radiates to the left hip. Left buttock and left leg pain.   EXAM: MRI PELVIS WITHOUT CONTRAST   TECHNIQUE: Multiplanar multisequence MR imaging of the pelvis was performed. No intravenous contrast was administered.   COMPARISON:  CT examination dated March 20, 2018   FINDINGS: Urinary Tract:  No abnormality visualized.   Bowel:  Unremarkable visualized pelvic bowel loops.   Vascular/Lymphatic: No pathologically enlarged lymph nodes. No significant vascular abnormality seen.   Reproductive:  No mass or other significant abnormality   Other: Trace amount of fluid in the left inguinal region suspicious for small left femoral hernia.   Musculoskeletal: Status post bilateral hip arthroplasty. No appreciable joint effusion. No perihardware loosening or edema to suggest acute fracture.  Susceptibility artifacts limits evaluation.   Bilateral hamstring origin tendinopathy. Generalized muscle atrophy. No intramuscular hematoma or fluid collection. No evidence of bursitis.   IMPRESSION: 1. Trace amount of fluid in the left inguinal region suspicious for small left femoral hernia. 2. Status post bilateral hip arthroplasty. No appreciable joint effusion. No perihardware loosening or edema to suggest acute fracture. 3. Bilateral hamstring origin tendinopathy.     Electronically Signed   By: Keane Police D.O.   On: 11/30/2022 23:27  CLINICAL DATA:  Mid to low back pain radiating to the left hip, buttock and leg.   EXAM: MRI LUMBAR SPINE WITHOUT CONTRAST   TECHNIQUE: Multiplanar, multisequence MR imaging of the lumbar spine was performed. No intravenous contrast was administered.   COMPARISON:  None Available.   FINDINGS: Segmentation:  5 lumbar type vertebral bodies.   Alignment: Scoliotic curvature convex to the right with the apex at L3.   Vertebrae: No fracture or focal bone lesion. No edematous marrow changes or edematous facet arthritis.   Conus medullaris and cauda equina: Conus extends to the L1 level. Conus and cauda equina appear normal.   Paraspinal and other soft tissues: Negative   Disc levels:   T12-L1: Chronic disc degeneration with endplate osteophytes and bulging of the disc more  towards the right. Mild right foraminal narrowing, not likely compressive.   L1-2: Disc degeneration with loss of disc height. Endplate osteophytes and minimal bulging of the disc. Mild facet osteoarthritis. No compressive stenosis.   L2-3: Disc degeneration with desiccation and loss of height. Endplate osteophytes and bulging of the disc slightly more towards the left. Mild facet and ligamentous hypertrophy on the left. Mild narrowing of the left lateral recess but no likely neural compression.   L3-4: Endplate osteophytes and bulging of the disc  slightly more prominent towards the left. Mild facet and ligamentous hypertrophy. Mild narrowing of the left lateral recess but no likely neural compression.   L4-5: Mild bulging of the disc more prominent towards the right. Mild facet and ligamentous hypertrophy. Mild narrowing of the right lateral recess but no likely neural compression.   L5-S1: Minimal bulging of the disc more towards the right. No compressive canal or foraminal narrowing.   IMPRESSION: 1. Scoliotic curvature convex to the right with the apex at L3. 2. Chronic degenerative disc disease from T12-L1 through L5-S1. No apparent compressive stenosis of the canal or foramina. Mild lateral recess narrowing on the left at L2-3 and L3-4 and on the right at L4-5. I do not see an abnormality to clearly explain the patient's left-sided radicular presentation.     Electronically Signed   By: Nelson Chimes M.D.   On: 12/01/2022 09:29    PATIENT SURVEYS:  LEFS 32/80   COGNITION: Overall cognitive status: Within functional limits for tasks assessed     SENSATION: WFL   POSTURE: rounded shoulders, forward head, and flexed trunk   PALPATION: Tenderness along spine; noted left hip elevation versus right; right trunk shift; concave spinal curve to the right lumbar spine  LUMBAR ROM:   AROM eval 02/06/23  Flexion 50% available 70% available  Extension 10% available 10-15% available  Right lateral flexion    Left lateral flexion    Right rotation    Left rotation     (Blank rows = not tested)  LOWER EXTREMITY ROM:     Active  Right eval Left eval  Hip flexion 62 80  Hip extension    Hip abduction    Hip adduction    Hip internal rotation    Hip external rotation    Knee flexion    Knee extension  -12 *  Ankle dorsiflexion    Ankle plantarflexion    Ankle inversion    Ankle eversion     (Blank rows = not tested)  LOWER EXTREMITY MMT:    MMT Right eval Left eval Right 02/06/2023 Left 02/06/2023   Hip flexion 3+ 4 4* 4+  Hip extension      Hip abduction      Hip adduction      Hip internal rotation      Hip external rotation      Knee flexion      Knee extension 4+ 5 5 4+*  Ankle dorsiflexion 4+ '5 5 5  '$ Ankle plantarflexion      Ankle inversion      Ankle eversion       (Blank rows = not tested)   FUNCTIONAL TESTS:  5 times sit to stand: 44.65 sec 2 minute walk test: 267 ft  GAIT: Distance walked: 267 Assistive device utilized: None Level of assistance: Modified independence Comments: left foot internal rotation; right trunk shift  TODAY'S TREATMENT:  DATE:  02/06/2023 Progress note LEFS 30/80 AROM and MMT's see above 5 x sit to stand 25.16 sec 2 MWT 342 ft without AD   02/04/23 Supine: Moist heat x 5' to decrease pain and improve tissue extensibility (all exercise done while patient on heat) LTR x 15 Hip abduction with belt 5" hold x 10 Hip adduction with ball 5" hold x 10  Sitting: Scapular retractions 2 x 10 Thoracic extensions (caused some increased neck pain) Hamstring stretch with strap 10 x 10"  Standing: Hip abduction 2 x 10 Heel raises x 10 Hip extension 2 x 10    02/01/2023 Supine: Moist heat to right low back and neck x 5' in supine to decrease pain and increase soft tissue extensibility LTR x 2' Leg lengtheners 5" x 8 Leg press 5" x 8 Hip adduction with ball x 10 x 5" hold Hip abduction with belt 5" hold x 10     01/29/23 Supine: Moist heat to right low back and glute while in supine LTR x 15 Leg lengtheners 5" hold x 8 Leg press 5" hold x 8  Seated: Scapular retraction x 8  Standing: Heel raises x 10 Hip abduction x 10 Hip extension x 10 Anterior hip stretch (right foot back and raise right arm) x 8   01/23/23 Goal review, HEP review Nustep 5 minutes level 2 UE/LE seat 10 Standing hip excursions  5X each  Corner stretch 3X20"  Seated scap retractions 10X"  Postural corrections  Rt OH reach to Lt 10X  Sit to stands no UE 10X Supine bridge 15X5"  Trunk rotations 10X each  SLR 10X each  01/08/2023 physical therapy evaluation and HEP instruction    PATIENT EDUCATION:  Education details: Patient educated on exam findings, POC, scope of PT, HEP, and what to expect next visit. Person educated: Patient Education method: Explanation, Demonstration, and Handouts Education comprehension: verbalized understanding, returned demonstration, verbal cues required, and tactile cues required   HOME EXERCISE PROGRAM: 01/29/23 leg press and leg lengthener Access Code: [email or web address] URL: [email or web address] Date: 01/09/2023 Prepared by: [facility] - Rehab  Exercises - Sit to Stand  - 2 x daily - 7 x weekly - 2 sets - 5 reps - right arm and reach overhead to the left  - 2 x daily - 7 x weekly - 1 sets - 10 reps - 2 sec hold - Supine Quad Set  - 2 x daily - 7 x weekly - 1 sets - 10 reps - 5 sec hold - Supine Bridge  - 2 x daily - 7 x weekly - 1 sets - 10 reps  ASSESSMENT:  CLINICAL IMPRESSION: Progress note today. Patient continues with subjective complaints of pain that has changed very little since start of therapy however she does show good improvement with functional testing.  Patient asked for discharge at this time secondary to little improvement with her pain.        OBJECTIVE IMPAIRMENTS: Abnormal gait, decreased activity tolerance, decreased balance, decreased endurance, decreased mobility, difficulty walking, decreased ROM, decreased strength, hypomobility, increased edema, increased fascial restrictions, impaired perceived functional ability, increased muscle spasms, impaired flexibility, and pain.   ACTIVITY LIMITATIONS: carrying, lifting, bending, sitting, standing, squatting, sleeping, stairs, transfers, bed mobility, bathing, toileting, hygiene/grooming, and caring for  others  PARTICIPATION LIMITATIONS: meal prep, cleaning, laundry, driving, shopping, community activity, and yard work  PERSONAL FACTORS: Age and Time since onset of injury/illness/exacerbation are also affecting patient's functional outcome.   REHAB POTENTIAL: Good  CLINICAL DECISION  MAKING: Stable/uncomplicated  EVALUATION COMPLEXITY: Low   GOALS: Goals reviewed with patient? No  SHORT TERM GOALS: Target date: 01/23/2023  patient will be independent with initial HEP Baseline: Goal status: MET  2.   Patient will self report 30% improvement to improve tolerance for functional activity  Baseline:  Goal status: IN PROGRESS   LONG TERM GOALS: Target date: 02/06/2023  Patient will be independent in self management strategies to improve quality of life and functional outcomes.   Baseline:  Goal status: IN PROGRESS  2.   Patient will self report 50% improvement to improve tolerance for functional activity  Baseline:  Goal status: IN PROGRESS  3.  Patient will improve LEFS score to 40/80 to demonstrate improved functional mobility  Baseline: 32/80 Goal status: IN PROGRESS  4.  Patient will improve 5 times sit to stand score from 44.65  sec to 30 sec to demonstrate improved functional mobility and increased lower extremity strength Baseline: 44.65 sec Goal status: MET  5.  Patient will increase distance on 2MWT to  325 ft  to demonstrate improved functional mobility walking household and community distances. Baseline: 267 ft Goal status: MET   PLAN:  PT FREQUENCY: 2x/week  PT DURATION: 4 weeks  PLANNED INTERVENTIONS: Therapeutic exercises, Therapeutic activity, Neuromuscular re-education, Balance training, Gait training, Patient/Family education, Joint manipulation, Joint mobilization, Stair training, Orthotic/Fit training, DME instructions, Aquatic Therapy, Dry Needling, Electrical stimulation, Spinal manipulation, Spinal mobilization, Cryotherapy, Moist heat,  Compression bandaging, scar mobilization, Splintting, Taping, Traction, Ultrasound, Ionotophoresis '4mg'$ /ml Dexamethasone, and Manual therapy   PLAN FOR NEXT SESSION:  discharge 2:32 PM, 02/06/23 Meosha Castanon Small Ajahni Nay MPT [facility] [phone]

## 2023-02-08 ENCOUNTER — Telehealth: Payer: Self-pay | Admitting: Pulmonary Disease

## 2023-02-08 DIAGNOSIS — J471 Bronchiectasis with (acute) exacerbation: Secondary | ICD-10-CM

## 2023-02-08 MED ORDER — DOXYCYCLINE HYCLATE 100 MG PO TABS: 100.0000 mg | ORAL_TABLET | Freq: Two times a day (BID) | ORAL | 0 refills | Status: AC

## 2023-02-08 MED ORDER — SODIUM CHLORIDE 3 % IN NEBU: INHALATION_SOLUTION | RESPIRATORY_TRACT | 12 refills | Status: DC

## 2023-02-08 NOTE — Telephone Encounter (Signed)
Called and spoke with pt's daughter Maree Erie who states that pt finished abx that Dr. [place] Bells prescribed on 2/26. Abx was only a 5-day course. Pt is still coughing and still has issues with congestion and Mia is wanting to know if another abx can be prescribed to see if that will help clear up pt's symptoms. Dr. Valeta Harms, please advise.

## 2023-02-08 NOTE — Telephone Encounter (Signed)
Pt daughter states mom was giving antibiotic for 5 days but she's been still coughing, they believe she needs a lil bit more

## 2023-02-08 NOTE — Telephone Encounter (Signed)
PCCM:  Send sputum cultures first  Then she can start levaquin '500mg'$  once daily X5 days   Garner Nash, DO [facility] 02/08/2023 5:04 PM

## 2023-02-11 ENCOUNTER — Encounter (HOSPITAL_COMMUNITY): Payer: PPO

## 2023-02-12 ENCOUNTER — Encounter (HOSPITAL_COMMUNITY): Payer: Self-pay | Admitting: Internal Medicine

## 2023-02-12 DIAGNOSIS — Z1231 Encounter for screening mammogram for malignant neoplasm of breast: Secondary | ICD-10-CM

## 2023-02-13 ENCOUNTER — Other Ambulatory Visit (HOSPITAL_COMMUNITY): Payer: Self-pay | Admitting: Internal Medicine

## 2023-02-13 ENCOUNTER — Encounter (HOSPITAL_COMMUNITY): Payer: PPO

## 2023-02-13 DIAGNOSIS — Z1231 Encounter for screening mammogram for malignant neoplasm of breast: Secondary | ICD-10-CM

## 2023-02-18 ENCOUNTER — Ambulatory Visit (HOSPITAL_COMMUNITY)
Admission: RE | Admit: 2023-02-18 | Discharge: 2023-02-18 | Disposition: A | Discharge status: Home / Self Care | Payer: PPO | Source: Ambulatory Visit | Attending: Internal Medicine | Admitting: Internal Medicine

## 2023-02-18 DIAGNOSIS — Z1231 Encounter for screening mammogram for malignant neoplasm of breast: Secondary | ICD-10-CM | POA: Insufficient documentation

## 2023-02-26 DIAGNOSIS — R1030 Lower abdominal pain, unspecified: Secondary | ICD-10-CM | POA: Diagnosis not present

## 2023-02-26 DIAGNOSIS — R062 Wheezing: Secondary | ICD-10-CM | POA: Diagnosis not present

## 2023-02-26 DIAGNOSIS — R300 Dysuria: Secondary | ICD-10-CM | POA: Diagnosis not present

## 2023-02-26 DIAGNOSIS — N390 Urinary tract infection, site not specified: Secondary | ICD-10-CM | POA: Diagnosis not present

## 2023-03-04 ENCOUNTER — Other Ambulatory Visit: Payer: Self-pay | Admitting: Pulmonary Disease

## 2023-03-05 DIAGNOSIS — M25551 Pain in right hip: Secondary | ICD-10-CM | POA: Diagnosis not present

## 2023-03-05 DIAGNOSIS — M5441 Lumbago with sciatica, right side: Secondary | ICD-10-CM | POA: Diagnosis not present

## 2023-03-05 DIAGNOSIS — G8929 Other chronic pain: Secondary | ICD-10-CM | POA: Diagnosis not present

## 2023-03-12 DIAGNOSIS — N390 Urinary tract infection, site not specified: Secondary | ICD-10-CM | POA: Diagnosis not present

## 2023-03-15 DIAGNOSIS — R350 Frequency of micturition: Secondary | ICD-10-CM | POA: Diagnosis not present

## 2023-03-25 ENCOUNTER — Other Ambulatory Visit: Payer: Self-pay | Admitting: Pulmonary Disease

## 2023-04-08 DIAGNOSIS — M25511 Pain in right shoulder: Secondary | ICD-10-CM | POA: Diagnosis not present

## 2023-04-08 DIAGNOSIS — M75101 Unspecified rotator cuff tear or rupture of right shoulder, not specified as traumatic: Secondary | ICD-10-CM | POA: Diagnosis not present

## 2023-04-08 DIAGNOSIS — M7581 Other shoulder lesions, right shoulder: Secondary | ICD-10-CM | POA: Diagnosis not present

## 2023-04-08 DIAGNOSIS — M7582 Other shoulder lesions, left shoulder: Secondary | ICD-10-CM | POA: Diagnosis not present

## 2023-04-17 ENCOUNTER — Other Ambulatory Visit: Payer: Self-pay | Admitting: Pulmonary Disease

## 2023-04-17 DIAGNOSIS — H26491 Other secondary cataract, right eye: Secondary | ICD-10-CM | POA: Diagnosis not present

## 2023-04-17 DIAGNOSIS — H401131 Primary open-angle glaucoma, bilateral, mild stage: Secondary | ICD-10-CM | POA: Diagnosis not present

## 2023-04-18 ENCOUNTER — Encounter: Payer: Self-pay | Admitting: Urology

## 2023-04-18 ENCOUNTER — Ambulatory Visit: Payer: PPO | Admitting: Urology

## 2023-04-18 VITALS — BP 116/73 | HR 85 | Wt 152.2 lb

## 2023-04-18 DIAGNOSIS — N3021 Other chronic cystitis with hematuria: Secondary | ICD-10-CM

## 2023-04-18 DIAGNOSIS — N952 Postmenopausal atrophic vaginitis: Secondary | ICD-10-CM | POA: Diagnosis not present

## 2023-04-18 DIAGNOSIS — H2010 Chronic iridocyclitis, unspecified eye: Secondary | ICD-10-CM

## 2023-04-18 DIAGNOSIS — K589 Irritable bowel syndrome without diarrhea: Secondary | ICD-10-CM | POA: Diagnosis not present

## 2023-04-18 DIAGNOSIS — R300 Dysuria: Secondary | ICD-10-CM | POA: Diagnosis not present

## 2023-04-18 LAB — URINALYSIS, ROUTINE W REFLEX MICROSCOPIC
Bilirubin, UA: NEGATIVE
Glucose, UA: NEGATIVE
Ketones, UA: NEGATIVE
Nitrite, UA: NEGATIVE
Protein,UA: NEGATIVE
Specific Gravity, UA: 1.01 (ref 1.005–1.030)
Urobilinogen, Ur: 0.2 mg/dL (ref 0.2–1.0)
pH, UA: 7 (ref 5.0–7.5)

## 2023-04-18 LAB — MICROSCOPIC EXAMINATION
RBC, Urine: >30 /hpf — AB (ref 0–2)
WBC, UA: >30 /hpf — AB (ref 0–5)

## 2023-04-18 MED ORDER — NITROFURANTOIN MONOHYD MACRO 100 MG PO CAPS
100.0000 mg | ORAL_CAPSULE | Freq: Two times a day (BID) | ORAL | 0 refills | Status: DC
Start: 2023-04-18 — End: 2024-04-03

## 2023-04-18 MED ORDER — ESTRADIOL 0.1 MG/GM VA CREA
TOPICAL_CREAM | VAGINAL | 12 refills | Status: AC
Start: 2023-04-18 — End: ?

## 2023-04-18 NOTE — Progress Notes (Signed)
Subjective: 1. Chronic cystitis with hematuria   2. Dysuria   3. Vaginal atrophy      Consult requested by Dwana Melena MD.  Christy Richardson is an 87 yo female who presents for evaluation of recurrent UTI's that got worse in November.  Her symptoms include dysuria with vaginal pain radiating into the arms.   She had symptoms over the weekend and was given fosfomycin for a previous infection but had one left over that she took this weekend.  She has nocturia 5-7x.  She has occasional UUI but no SUI.  She has had no hematuria.  She has had no fever with the infection but she has some LLQ discomfort.  She has some hesitancy.  The stream can be week.  She feels she empties but may have some intermittency.  She had a hysterectomy with an anterior repair in 1974.   She has had ESBL e. Coli on her cultures.   She has reported allergies to PCN and Sulfa years ago.  She a lumbar MRI in 12/23 and the kidneys were normal without stones or obstruction and a pelvic MRI that showed some colonic diverticuli but no bladder abnormalities.  She has some rectal prolapse with fecal soiling.  Her UA today has >30 WBC, >30 RBC and many bacteria.    She had normal renal function on labs last year.  ROS:  Review of Systems  HENT:  Positive for congestion.   Respiratory:  Positive for cough and shortness of breath.   Cardiovascular:  Positive for leg swelling.  Gastrointestinal:  Positive for diarrhea and heartburn.  Musculoskeletal:  Positive for back pain and joint pain.  Endo/Heme/Allergies:  Bruises/bleeds easily.  Psychiatric/Behavioral:  Positive for depression. The patient is nervous/anxious.   All other systems reviewed and are negative.   Allergies  Allergen Reactions   Prednisone     REACTION: Increase eye pressure with gloucoma. Broke out in rash after injection per Dr. Eulah Pont   Tramadol Itching   Adhesive [Tape]     Causes blisters/irritates skin-surgical tape   Penicillins     REACTION: Rash   Sulfonamide  Derivatives     REACTION: Rash - not sure    Past Medical History:  Diagnosis Date   Adenomatous polyp of colon 03/2007   Allergic rhinitis    Anxiety    a.) on BZO (lorazepam) PRN   Arthritis    Blood transfusion 1974   Bronchiectasis (HCC)    Cancer (HCC)    left arm   Deaf    in right 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 Mild TR; b.) TTE 07/16/2019: EF 60-65%, triv AR, mild MR/TR, G1DD.   Diet-controlled type 2 diabetes mellitus (HCC)    Diverticulosis of colon (without mention of hemorrhage)    DOE (dyspnea on exertion)    Dyspnea    Fatigue    Fecal incontinence    GERD (gastroesophageal reflux disease)    Glaucoma    Goiter    right side   Heart murmur    History of stress test    a. 01/2014 Lexi MV: no ischemia/infarct.   Hyperkalemia    Hyperlipidemia    Hypertension    a. 11/2018 Renal artery duplex: No RAS.    Hypothyroidism    IBS (irritable bowel syndrome)    Migraine    Mitral valve prolapse  a. 01/2014 Echo: no MR/MVP noted.   Neck mass    right side/ no airway problems/ per pt has had since 2005 (goiter)   Nonspecific abnormal electrocardiogram (ECG) (EKG)    Palpitations    a. 01/2014 CardioNet: wore for 9 days (rash). Sinus tach - 100's. No SVT/AF.   Pneumonia    Polyp, sigmoid colon    Post-operative nausea and vomiting    Pulmonary fibrosis (HCC)    Pulmonary nodules    Recurrent UTI    Tachycardia    Tremor     Past Surgical History:  Procedure Laterality Date   ABDOMINAL HYSTERECTOMY     CARDIOVASCULAR STRESS TEST  09/19/2011   No scintigraphic evidence of inducible myocardial ischemia. Pharmacological stress test without chest pain or EKG changes for ischemia.   COLONOSCOPY     ESOPHAGOGASTRODUODENOSCOPY     MASTOIDECTOMY     rt ear/ as a teenager   OOPHORECTOMY     SKIN CANCER EXCISION Left    arm   TOTAL HIP ARTHROPLASTY  5/10    left   TOTAL HIP ARTHROPLASTY Right 07/10/2022   Procedure: TOTAL HIP ARTHROPLASTY ANTERIOR APPROACH;  Surgeon: Kennedy Bucker, MD;  Location: [facility] ORS;  Service: Orthopedics;  Laterality: Right;   TRANSTHORACIC ECHOCARDIOGRAM  09/19/2011   EF >55%, stage 1 diastolic dysfunction, mild tricuspid valve regurg    Social History   Socioeconomic History   Marital status: Widowed    Spouse name: Not on file   Number of children: 2   Years of education: 10th grade   Highest education level: Not on file  Occupational History   Occupation: Retired    Comment: Retail  Tobacco Use   Smoking status: Never    Passive exposure: Past   Smokeless tobacco: Never  Vaping Use   Vaping Use: Never used  Substance and Sexual Activity   Alcohol use: Yes    Comment: rare   Drug use: No   Sexual activity: Not on file  Other Topics Concern   Not on file  Social History Narrative   Widowed since 2002. Lives with her Daughter.   Social Determinants of Health   Financial Resource Strain: Not on file  Food Insecurity: Not on file  Transportation Needs: Not on file  Physical Activity: Not on file  Stress: Not on file  Social Connections: Not on file  Intimate Partner Violence: Not on file    Family History  Problem Relation Age of Onset   Diabetes Father    Throat cancer Mother    Coronary artery disease Mother    Prostate cancer Brother    Asthma Daughter    Scoliosis Daughter    Colon cancer Paternal Grandfather    Esophageal cancer Neg Hx    Rectal cancer Neg Hx    Stomach cancer Neg Hx     Anti-infectives: Anti-infectives (From admission, onward)    Start     Dose/Rate Route Frequency Ordered Stop   04/18/23 0000  nitrofurantoin, macrocrystal-monohydrate, (MACROBID) 100 MG capsule        100 mg Oral Every 12 hours 04/18/23 1358         Current Outpatient Medications  Medication Sig Dispense Refill   acetaminophen (TYLENOL 8 HOUR ARTHRITIS PAIN) 650 MG CR tablet Take 650 mg  by mouth every 8 (eight) hours as needed for pain.     albuterol (PROVENTIL) (2.5 MG/3ML) 0.083% nebulizer solution Take 3 mLs (2.5 mg total) by nebulization every 6 (six) hours  as needed for wheezing or shortness of breath. 75 mL 0   albuterol (VENTOLIN HFA) 108 (90 Base) MCG/ACT inhaler Inhale 2 puffs into the lungs every 6 (six) hours as needed for wheezing or shortness of breath. 8 g 6   amLODipine (NORVASC) 5 MG tablet Take 1 tablet (5 mg total) by mouth 2 (two) times daily. 180 tablet 2   brimonidine (ALPHAGAN) 0.2 % ophthalmic solution Place 1 drop into both eyes 2 (two) times daily. Place 1 drop into both eyes 2 (two) times daily.     carvedilol (COREG) 12.5 MG tablet Take 1 tablet (12.5 mg total) by mouth 2 (two) times daily with a meal. No additional refill until office visit. 210 tablet 0   cetirizine (ZYRTEC) 10 MG tablet Take 10 mg by mouth every morning.     Cholecalciferol (VITAMIN D3 PO) Take 1 tablet by mouth daily at 6 (six) AM.     cyclobenzaprine (FLEXERIL) 10 MG tablet Take 10 mg by mouth 3 (three) times daily as needed for muscle spasms.     docusate sodium (COLACE) 100 MG capsule Take 1 capsule (100 mg total) by mouth 2 (two) times daily. 10 capsule 0   dorzolamide (TRUSOPT) 2 % ophthalmic solution Place 1 drop into both eyes 2 (two) times daily. Place 1 drop into both eyes 2 (two) times daily.     estradiol (ESTRACE) 0.1 MG/GM vaginal cream Apply 0.5 gm vaginally with the applicator of tip of the finger nightly for 2 weeks and then 2-3x weekly. 42.5 g 12   gabapentin (NEURONTIN) 300 MG capsule Take 300 mg by mouth at bedtime.     guaifenesin (MUCUS RELIEF) 400 MG TABS tablet Take 1,200 mg by mouth every morning.     hydrALAZINE (APRESOLINE) 50 MG tablet Take 0.5 tablets (25 mg total) by mouth daily as needed. 15 tablet 0   HYDROcodone-acetaminophen (NORCO/VICODIN) 5-325 MG tablet Take 1-2 tablets by mouth every 4 (four) hours as needed for moderate pain (pain score 4-6). 30  tablet 0   levothyroxine (SYNTHROID) 75 MCG tablet Take 75 mcg by mouth daily before breakfast.     LORazepam (ATIVAN) 0.5 MG tablet Take 0.5 mg by mouth 2 (two) times daily as needed for anxiety (tremors).     losartan (COZAAR) 100 MG tablet TAKE ONE TABLET BY MOUTH ONCE DAILY. 90 tablet 2   Multiple Vitamins-Minerals (MULTIVITAMIN WITH MINERALS) tablet Take 1 tablet by mouth 3 (three) times a week.     nitrofurantoin, macrocrystal-monohydrate, (MACROBID) 100 MG capsule Take 1 capsule (100 mg total) by mouth every 12 (twelve) hours. 14 capsule 0   pantoprazole (PROTONIX) 40 MG tablet Take 1 tablet (40 mg total) by mouth daily before breakfast. 90 tablet 3   Polyethyl Glycol-Propyl Glycol (SYSTANE OP) Apply 1 drop to eye as needed.     pravastatin (PRAVACHOL) 40 MG tablet Take 1 tablet (40 mg total) by mouth at bedtime.     Probiotic Product (PROBIOTIC PO) Take 1 tablet by mouth daily at 6 (six) AM.     rOPINIRole (REQUIP) 0.5 MG tablet Take 0.5 mg by mouth at bedtime.     sertraline (ZOLOFT) 100 MG tablet Take 2 tablets by mouth every morning.     sodium chloride (OCEAN) 0.65 % nasal spray Place 2 sprays into both nostrils daily at 6 (six) AM. Use as needed     sodium chloride HYPERTONIC 3 % nebulizer solution Take as 3 mL neb as needed for cough or chest  congestion 750 mL 12   Tiotropium Bromide-Olodaterol (STIOLTO RESPIMAT) 2.5-2.5 MCG/ACT AERS Inhale 2 puffs into the lungs daily. (Patient taking differently: Inhale 2 puffs into the lungs every morning.) 4 g 5   traMADol (ULTRAM) 50 MG tablet Take 1 tablet (50 mg total) by mouth every 6 (six) hours as needed. 30 tablet 0   traZODone (DESYREL) 50 MG tablet Take 50 mg by mouth at bedtime.     triamcinolone (NASACORT) 55 MCG/ACT AERO nasal inhaler Place 1 spray into the nose at bedtime.     No current facility-administered medications for this visit.     Objective: Vital signs in last 24 hours: BP 116/73   Pulse 85   Wt 152 lb 3.2 oz (69  kg)   BMI 23.14 kg/m   Intake/Output from previous day: No intake/output data recorded. Intake/Output this shift: @IOTHISSHIFT @   Physical Exam Vitals reviewed.  Constitutional:      Appearance: Normal appearance.  Cardiovascular:     Rate and Rhythm: Normal rate and regular rhythm.     Heart sounds: Normal heart sounds.  Pulmonary:     Effort: Pulmonary effort is normal. No respiratory distress.     Breath sounds: Normal breath sounds.  Abdominal:     General: Abdomen is flat.     Palpations: Abdomen is soft.     Tenderness: There is abdominal tenderness (mild LLQ).  Genitourinary:    Comments: Nl external genitalia. Moderate to severe introital stenosis. Severe atrophic vaginitis without prolapse.  Urethra meatus with a moderate caruncle. Bladder mildly tender without mass.  S/p hysterectomy. No adnexal masses.  Musculoskeletal:        General: No swelling or tenderness. Normal range of motion.  Skin:    General: Skin is warm and dry.  Neurological:     General: No focal deficit present.     Mental Status: She is alert and oriented to person, place, and time.  Psychiatric:        Mood and Affect: Mood normal.        Behavior: Behavior normal.     Lab Results:  Results for orders placed or performed in visit on 04/18/23 (from the past 24 hour(s))  Urinalysis, Routine w reflex microscopic     Status: Abnormal   Collection Time: 04/18/23  1:30 PM  Result Value Ref Range   Specific Gravity, UA 1.010 1.005 - 1.030   pH, UA 7.0 5.0 - 7.5   Color, UA Yellow Yellow   Appearance Ur Clear Clear   Leukocytes,UA 3+ (A) Negative   Protein,UA Negative Negative/Trace   Glucose, UA Negative Negative   Ketones, UA Negative Negative   RBC, UA 2+ (A) Negative   Bilirubin, UA Negative Negative   Urobilinogen, Ur 0.2 0.2 - 1.0 mg/dL   Nitrite, UA Negative Negative   Microscopic Examination See below:    Narrative   Performed at:  [address] - [facility] [address], [place], [place]  [id number] Lab Director: Chinita Pester MT, Phone:  [phone]  Microscopic Examination     Status: Abnormal   Collection Time: 04/18/23  1:30 PM   Urine  Result Value Ref Range   WBC, UA >30 (A) 0 - 5 /hpf   RBC, Urine >30 (A) 0 - 2 /hpf   Epithelial Cells (non renal) 0-10 0 - 10 /hpf   Bacteria, UA Many (A) None seen/Few   Narrative   Performed at:  01 - [facility] [address] [place],  [place]  [id number] Lab Director: Chinita Pester MT, Phone:  [phone]    BMET No results for input(s): "NA", "K", "CL", "CO2", "GLUCOSE", "BUN", "CREATININE", "CALCIUM" in the last 72 hours. PT/INR No results for input(s): "LABPROT", "INR" in the last 72 hours. ABG No results for input(s): "PHART", "HCO3" in the last 72 hours.  Invalid input(s): "PCO2", "PO2" I have reviewed her labs and cultures in EP[facility]. Studies/Results: No results found. I have reviewed the lumbar and pelvic MRI's.    Assessment/Plan: Chronic cystitis with hematuria.   She has a history of e. Coli with ESBL and has an allergy to sulfa and didn't tolerate fosfomycin well.  The last culture was sensitive to nitrofurantion which I think we will need to use despite her age.   I will treat her for 10 days bid and then give her 50mg  nightly for suppression pending a return for cystoscopy.  Vaginal atrophy.  She has severe  atrophy with stenosis and a urethral caruncle.  I will get her started on topical estrogen.    Meds ordered this encounter  Medications   nitrofurantoin, macrocrystal-monohydrate, (MACROBID) 100 MG capsule    Sig: Take 1 capsule (100 mg total) by mouth every 12 (twelve) hours.    Dispense:  14 capsule    Refill:  0   estradiol (ESTRACE) 0.1 MG/GM vaginal cream    Sig: Apply 0.5 gm vaginally with the applicator of tip of the finger nightly for 2 weeks and then 2-3x weekly.    Dispense:  42.5 g    Refill:  12     Orders Placed This Encounter  Procedures   Urine Culture    Microscopic Examination   Urinalysis, Routine w reflex microscopic   In and Out Cath     Return in about 4 weeks (around 05/16/2023) for 4-6 weeks for cystoscopy.  .    CC: Dr. Dwana Melena.      Bjorn Pippin 04/19/2023

## 2023-04-18 NOTE — Progress Notes (Signed)
Pt is prepped for and in and out catherization. Patient was cleaned and prepped in a sterle fashion with betadine. A 14 fr catheter foley was inserted. Urine return was note 50 ml.  Performed by Guss Bunde, CMA  Urine sent for culture per MD

## 2023-04-19 ENCOUNTER — Telehealth: Payer: Self-pay | Admitting: Pulmonary Disease

## 2023-04-19 NOTE — Telephone Encounter (Signed)
Spoke with the pt's daughter, Pedro Earls  She states that the pt has had UTI for a while and has tried multiple abx  She was started on macrobid yesterday  She is not not feeling well in general and having pain behind her ears  She recalls that Dr Kendrick Fries told her not to ever take this med  I advised that she immediately call the doc that put her on it to let them know she is having side effects  She agrees to to do this  Will seek emergent care if symptoms worsens

## 2023-04-22 ENCOUNTER — Telehealth: Payer: Self-pay

## 2023-04-22 DIAGNOSIS — R200 Anesthesia of skin: Secondary | ICD-10-CM | POA: Diagnosis not present

## 2023-04-22 LAB — URINE CULTURE

## 2023-04-22 NOTE — Telephone Encounter (Signed)
Urology triage states since taking macrobid patient is having problems with her lungs.  She has a hx of COPD and pulmonologist recommended patient stop Macrobid rx.  Do you recommend an alternative antibiotic?  Rx'd on 05/16

## 2023-04-22 NOTE — Telephone Encounter (Signed)
-----   Message from Ferdinand Lango, RN sent at 04/22/2023  9:13 AM EDT ----- Urology triage

## 2023-04-22 NOTE — Telephone Encounter (Signed)
Patient called requesting culture results.  I made her aware of MD response to macrobid abx as well as informing her MD has not received culture results at this time.  She is aware we will contact her when MD reviews results.

## 2023-04-22 NOTE — Telephone Encounter (Signed)
I returned the daughter's for clarification.  She now states her mother is not having any lung issues while taking the rx she was just advised by her pulmonologist not to take it.  I informed her of MD response to stopping the rx until her culture comes back and her daughter stated since she's not having any issues she will continue the rx for now.  She requested culture results be called to herself and the patient once MD reviews.

## 2023-04-23 ENCOUNTER — Telehealth: Payer: Self-pay

## 2023-04-23 MED ORDER — NITROFURANTOIN MACROCRYSTAL 50 MG PO CAPS: 50.0000 mg | ORAL_CAPSULE | Freq: Every day | ORAL | 1 refills | Status: DC

## 2023-04-23 NOTE — Addendum Note (Signed)
Addended by: Grier Rocher on: 04/23/2023 02:02 PM   Modules accepted: Orders

## 2023-04-23 NOTE — Telephone Encounter (Signed)
Patient is aware of MD response and recommendation for a refferal to ID, patient agreed to move forward with referral.  Can you please work on this.

## 2023-04-23 NOTE — Telephone Encounter (Addendum)
FYI- patient agreed to referral to ID.  Rx for macrodantin 50mg  sent to pharmacy and patient aware to take this qhs.  She is scheduled for a cysto with you on 07/11.

## 2023-04-23 NOTE — Addendum Note (Signed)
Addended by: Grier Rocher on: 04/23/2023 02:01 PM   Modules accepted: Orders

## 2023-04-23 NOTE — Telephone Encounter (Signed)
-----   Message from Bjorn Pippin, MD sent at 04/22/2023  9:10 PM EDT ----- Her culture is growing e. Coli with her usual resistance pattern.   It is sensitive to Augmentin, Macrobid and bactrim but the rest of the useful antibiotics are IV only.   She has reported allergies to Penicillin and Sulfa and reported pulmonary issues with nitrofurantoin after only a few doses despite the pulmonologist thinking it would be ok.  At this point, I think she needs a referral to ID.  It is possible since her PCN allergy was only a rash, she could tolerate Augmentin but I would prefer infectious diseaes make that determination.   Please put in a consult to them for chronic cystitis with a resistant e. Coli and drug allergies.   Thanks.  ----- Message ----- From: Grier Rocher, CMA Sent: 04/22/2023   4:02 PM EDT To: Bjorn Pippin, MD  Please review, patient started on macrobid

## 2023-05-02 NOTE — Telephone Encounter (Signed)
Patient calling to check on status of referral and doctor name.  Please advise.

## 2023-05-06 ENCOUNTER — Other Ambulatory Visit: Payer: Self-pay | Admitting: Cardiovascular Disease

## 2023-05-06 ENCOUNTER — Other Ambulatory Visit: Payer: Self-pay | Admitting: Pulmonary Disease

## 2023-05-06 DIAGNOSIS — M25512 Pain in left shoulder: Secondary | ICD-10-CM | POA: Diagnosis not present

## 2023-05-07 DIAGNOSIS — H7011 Chronic mastoiditis, right ear: Secondary | ICD-10-CM | POA: Diagnosis not present

## 2023-05-07 DIAGNOSIS — R070 Pain in throat: Secondary | ICD-10-CM | POA: Diagnosis not present

## 2023-05-16 ENCOUNTER — Other Ambulatory Visit: Payer: Self-pay

## 2023-05-16 ENCOUNTER — Encounter: Payer: Self-pay | Admitting: Internal Medicine

## 2023-05-16 ENCOUNTER — Ambulatory Visit (INDEPENDENT_AMBULATORY_CARE_PROVIDER_SITE_OTHER): Payer: PPO | Admitting: Internal Medicine

## 2023-05-16 VITALS — BP 130/71 | HR 68 | Resp 16 | Ht 68.0 in | Wt 154.0 lb

## 2023-05-16 DIAGNOSIS — N390 Urinary tract infection, site not specified: Secondary | ICD-10-CM

## 2023-05-16 DIAGNOSIS — R8271 Bacteriuria: Secondary | ICD-10-CM | POA: Diagnosis not present

## 2023-05-16 NOTE — Progress Notes (Signed)
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 Referring Provider: Bjorn Pippin    Patient Active Problem List   Diagnosis Date Noted   S/P total right hip arthroplasty 07/11/2022   Status post total hip replacement, right 07/10/2022   Preop respiratory exam 06/21/2022   Sinus tachycardia 02/04/2018   Hyperlipidemia LDL goal <70 08/16/2015   Left ventricular diastolic dysfunction, NYHA class 1 03/13/2014   Chest pain 01/30/2014   Tremor, essential 05/07/2013   Fecal incontinence 09/30/2012   Pulmonary nodules 05/15/2012   Dyspnea 05/27/2011   Bronchiectasis without acute exacerbation (HCC) 09/26/2009   OSTEOPENIA 03/17/2009   COLONIC POLYPS, ADENOMATOUS, HX OF 09/27/2008   BACK PAIN 09/01/2008   LIPOMA 06/01/2008   UTI'S, RECURRENT 06/10/2007   Hypothyroidism 01/07/2007   Hyperlipidemia 01/07/2007   Anxiety 01/07/2007   DEPRESSION 01/07/2007   MIGRAINE HEADACHE 01/07/2007   GLAUCOMA NEC 01/07/2007   DECREASED HEARING 01/07/2007   Essential hypertension 01/07/2007   Allergic rhinitis 01/07/2007   GERD 01/07/2007   IBS 01/07/2007   OSTEOARTHRITIS 01/07/2007      HPI: Christy Richardson is a 88 y.o. female, copd/bronchiectasis, referred by urology for ecoli esbl in the urine   Patient said she never had uti but kidney infection  Patient had an episode of kidney infection "years ago." She doesn't remember sx back then   In 10/2022, she developed burning dysuria, urge, without gross hematuria. Went to pcp, they gave cipro and then doxycycline. She doesn't remember or know what the culture of the urine grew. She received full course of abx for the cipro then doxy. She was also given fosfomycin 02/2023 but could tolerate. Sx seemed a little better but never gone away and then become worse again   She reports that up until 04/18/23 when she saw dr Annabell Howells, the symptoms never gone away completely  She had 2 urine cx  before that time on epic that showed ecoli esbl.  She was given nitrofurantoin by dr Annabell Howells and she said at this time she doesn't feel any of the above sx   Allergy to pcn/bactrim She took pcn at age 33 and developed -- no anaphylaxis. She was never given amox. She tolerated cephalexin however She said she had chart documentation of allergy to sulfa. She doesn't remember what  it is. She never actually remember taking bactrim/septra or having allergy to these     Review of Systems: ROS  All other ros negative      Past Medical History:  Diagnosis Date   Adenomatous polyp of colon 03/2007   Allergic rhinitis    Anxiety    a.) on BZO (lorazepam) PRN   Arthritis    Blood transfusion 1974   Bronchiectasis (HCC)    Cancer (HCC)    left arm   Deaf    in right 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 Mild TR; b.) TTE 07/16/2019: EF 60-65%, triv AR, mild MR/TR, G1DD.   Diet-controlled type 2 diabetes mellitus (HCC)    Diverticulosis of colon (without mention of hemorrhage)    DOE (dyspnea on exertion)    Dyspnea    Fatigue    Fecal incontinence    GERD (gastroesophageal reflux disease)    Glaucoma    Goiter    right side   Heart murmur  History of stress test    a. 01/2014 Lexi MV: no ischemia/infarct.   Hyperkalemia    Hyperlipidemia    Hypertension    a. 11/2018 Renal artery duplex: No RAS.    Hypothyroidism    IBS (irritable bowel syndrome)    Migraine    Mitral valve prolapse    a. 01/2014 Echo: no MR/MVP noted.   Neck mass    right side/ no airway problems/ per pt has had since 2005 (goiter)   Nonspecific abnormal electrocardiogram (ECG) (EKG)    Palpitations    a. 01/2014 CardioNet: wore for 9 days (rash). Sinus tach - 100's. No SVT/AF.   Pneumonia    Polyp, sigmoid colon    Post-operative nausea and vomiting    Pulmonary fibrosis (HCC)    Pulmonary nodules     Recurrent UTI    Tachycardia    Tremor     Social History   Tobacco Use   Smoking status: Never    Passive exposure: Past   Smokeless tobacco: Never  Vaping Use   Vaping Use: Never used  Substance Use Topics   Alcohol use: Yes    Comment: rare   Drug use: No    Family History  Problem Relation Age of Onset   Diabetes Father    Throat cancer Mother    Coronary artery disease Mother    Prostate cancer Brother    Asthma Daughter    Scoliosis Daughter    Colon cancer Paternal Grandfather    Esophageal cancer Neg Hx    Rectal cancer Neg Hx    Stomach cancer Neg Hx     Allergies  Allergen Reactions   Prednisone     REACTION: Increase eye pressure with gloucoma. Broke out in rash after injection per Dr. Eulah Pont   Tramadol Itching   Adhesive [Tape]     Causes blisters/irritates skin-surgical tape   Penicillins     REACTION: Rash   Sulfonamide Derivatives     REACTION: Rash - not sure    OBJECTIVE: Vitals:   05/16/23 1359  BP: 130/71  Pulse: 68  Resp: 16  SpO2: 94%  Weight: 154 lb (69.9 kg)  Height: 5\' 8"  (1.727 m)   There is no height or weight on file to calculate BMI.   Physical Exam General/constitutional: no distress, pleasant HEENT: Normocephalic, PER, Conj Clear, EOMI, Oropharynx clear Neck supple CV: rrr no mrg Lungs: clear to auscultation, normal respiratory effort Abd: Soft, Nontender Ext: no edema Skin: No Rash Neuro: nonfocal MSK: no peripheral joint swelling/tenderness/warmth; back spines nontender    Lab: Lab Results  Component Value Date   WBC 7.2 07/19/2022   HGB 9.6 (L) 07/19/2022   HCT 29.5 (L) 07/19/2022   MCV 92.2 07/19/2022   PLT 377 07/19/2022   Last metabolic panel Lab Results  Component Value Date   GLUCOSE 110 (H) 07/19/2022   NA 137 07/19/2022   K 3.1 (L) 07/19/2022   CL 104 07/19/2022   CO2 27 07/19/2022   BUN 8 07/19/2022   CREATININE 0.75 07/19/2022   GFRNONAA >60 07/19/2022   CALCIUM 9.0 07/19/2022    PROT 7.6 03/20/2018   ALBUMIN 4.2 03/20/2018   BILITOT 1.3 (H) 03/20/2018   ALKPHOS 55 03/20/2018   AST 24 03/20/2018   ALT 17 03/20/2018   ANIONGAP 6 07/19/2022    Microbiology:  Serology:  Imaging: Reviewed  11/2022 mri pelvis 1. Scoliotic curvature convex to the right with the apex at L3. 2. Chronic degenerative  disc disease from T12-L1 through L5-S1. No apparent compressive stenosis of the canal or foramina. Mild lateral recess narrowing on the left at L2-3 and L3-4 and on the right at L4-5. I do not see an abnormality to clearly explain the patient's left-sided radicular presentation.  Assessment/plan: Problem List Items Addressed This Visit       Genitourinary   UTI'S, RECURRENT   Other Visit Diagnoses     Bacteriuria    -  Primary         88 yo female hx hysterectomy, rectal prolapse, recurrent uti referred by ?urology here for same due to esbl ecoli and several abx allergy  Allergy pcn and sulfa "years ago."  I am unclear if she actually have recurrent uti. Her initial sympmtomatology overlaps with uti, but no relief of sx from 10/2022 to 04/2023 is rather improbable for bacterial cystitis. There is no chronic infectious bacterial cystitis (outside of tb).  She definitely fits criteria for bacteriuria   She does have great relief with nitrofurantoin and at this time doesn't know what to do (as her lung doc doesn't want her to run risk of pneumonitis which is low)  3 choices here for her which at this time she can't decide: 1 -- continue nitrofurantoin if she is concerned she did have recurrent uti and the nitrofurantoin helped so much 2 -- get off nitrofurantoin and trial of prevention methenamine/vit c  3 -- bactrim remains an options for treatment and prophylaxis as well  She'll follow up with dr Annabell Howells and decide at that time   Another thing to do that I recommend is to get allergy dermatologic skin test for pencillin as that would be a good option  to have in case she has recurrent infection of any sort   Follow up with ID clinic as  needed      Follow-up: Return if symptoms worsen or fail to improve.  Raymondo Band, MD [facility] 05/16/2023, 2:03 PM

## 2023-05-16 NOTE — Patient Instructions (Signed)
I am unclear if you actually have recurrent uti. Your initial sympmtomatology overlaps with uti, but no relief of sx from 10/2022 to 04/2023 is rather improbable for bacterial cystitis. There is no chronic infectious bacterial cystitis (outside of tb).  You definitely fits criteria for bacteriuria (which can be present/colonized in the urine but doesn't cause infection)   As you have great relief with nitrofurantoin and at this time doesn't know what to do (as her lung doc doesn't want her to run risk of pneumonitis which is low)  3 choices here for you which at this time you can't decide: 1 -- continue nitrofurantoin if she is concerned she did have recurrent uti and the nitrofurantoin helped so much 2 -- get off nitrofurantoin and trial of prevention methenamine/vit c  3 -- bactrim remains an options for treatment and prophylaxis as well  You can talk to your urologist further and see what you want to do   Please also see allergist and test for penicillin allergy

## 2023-05-19 NOTE — Progress Notes (Unsigned)
Cardiology Office Note  Date:  05/20/2023   ID:  Christy Richardson, DOB 1935/01/26, MRN [id number]  PCP:  Benita Stabile, MD   Chief Complaint  Patient presents with   12 month follow up     Patient c/o bilateral LE edema, she quit taking the am dose of amlodipine and has noticed the edema to be better and has shortness of breath most days.  Medications reviewed by the patient verbally.     HPI:  Christy Richardson is a 88 y.o. female with a history of  labile hypertension, no renal artery stenosis Anxiety mitral valve prolapse per the patient with no mitral valve regurgitation on echocardiogram 2015 , 2020 palpitations, difficulty tolerating event monitor bronchiectasis,  GERD, goirter,  mixed hyperlipidemia,  hypertension She presents today for routine follow-up of her labile blood pressure and tachycardia  Last office visit May 2023  Right hip surgery aug 23, Having right leg muscle pain After activity gets significant fatigue No regular activity or exercise program  Continues to have bladder problems UTI, ecoli Followed by ID  Hernia right groin, was told by orthopedics not to do surgery given her age  Blood pressure medications include  amlodipine 5 mg at 6 pm, carvedilol 1 pill BID hydralazine as needed, losartan 100 mg at 6 pm  BP high in AM, lower in the day Sometimes on hold carvedilol in the morning blood pressure will drop, previously used to do half pill in the morning whole pill in the evening  Poor sleep, limited by nocturia and arthritis pain likely contributing to fatigue  Prior echocardiogram August 2020, essentially normal study  CT scan chest September 2022 chronic infection 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  Chronic SOB and cough , bronchitis No recent exacerbation  Previously using Ativan  or trazodone for anxiety/sleep  EKG personally reviewed by myself on todays visit Nsr rate 64 bpm no  significant ST or T wave changes  Echocardiogram 2020  no significant MR, no prolapse noted no indication for antibiotics  Lab work reviewed Excellent CBC, total cholesterol, BMP  Past medical history reviewed Echo 07/2019   1. The left ventricle has normal systolic function with an ejection fraction of 60-65%. The cavity size was normal. There is mildly increased left ventricular wall thickness. Left ventricular diastolic Doppler parameters are consistent with impaired  relaxation.  2. The right ventricle has normal systolic function. The cavity was normal. There is no increase in right ventricular wall thickness. Right ventricular systolic pressure is normal with an estimated pressure of 25.3 mmHg.  echo Doppler study on 02/02/2014.  This showed an ejection fraction of 60-65%.  She had grade 1 diastolic dysfunction.  There was mild aortic sclerosis without stenosis.      lexiscan perfusion study on 02/04/2014 was normal without scar or ischemia.  Post stress ejection fraction was 73%.      CardioNet monitor to assess for palpitations.  Unfortunately, she developed significant skin irritation, and only wore this for 9 days.  This revealed episodes of sinus rhythm, but she did have episodes of sinus tachycardia with rates in the low 100s.  There was there were no episodes of SVT or atrial fibrillation.  PMH:   has a past medical history of Adenomatous polyp of colon (03/2007), Allergic rhinitis, Anxiety, Arthritis, Blood transfusion (1974), Bronchiectasis (HCC), Cancer (HCC), Deaf, Depression, Diastolic dysfunction, Diet-controlled type 2 diabetes mellitus (HCC), Diverticulosis of colon (without mention of hemorrhage), DOE (dyspnea on  exertion), Dyspnea, Fatigue, Fecal incontinence, GERD (gastroesophageal reflux disease), Glaucoma, Goiter, Heart murmur, History of stress test, Hyperkalemia, Hyperlipidemia, Hypertension, Hypothyroidism, IBS (irritable bowel syndrome), Migraine, Mitral valve  prolapse, Neck mass, Nonspecific abnormal electrocardiogram (ECG) (EKG), Palpitations, Pneumonia, Polyp, sigmoid colon, Post-operative nausea and vomiting, Pulmonary fibrosis (HCC), Pulmonary nodules, Recurrent UTI, Tachycardia, and Tremor.  PSH:    Past Surgical History:  Procedure Laterality Date   ABDOMINAL HYSTERECTOMY     CARDIOVASCULAR STRESS TEST  09/19/2011   No scintigraphic evidence of inducible myocardial ischemia. Pharmacological stress test without chest pain or EKG changes for ischemia.   COLONOSCOPY     ESOPHAGOGASTRODUODENOSCOPY     MASTOIDECTOMY     rt ear/ as a teenager   OOPHORECTOMY     SKIN CANCER EXCISION Left    arm   TOTAL HIP ARTHROPLASTY  5/10   left   TOTAL HIP ARTHROPLASTY Right 07/10/2022   Procedure: TOTAL HIP ARTHROPLASTY ANTERIOR APPROACH;  Surgeon: Kennedy Bucker, MD;  Location: [facility] ORS;  Service: Orthopedics;  Laterality: Right;   TRANSTHORACIC ECHOCARDIOGRAM  09/19/2011   EF >55%, stage 1 diastolic dysfunction, mild tricuspid valve regurg    Current Outpatient Medications  Medication Sig Dispense Refill   acetaminophen (TYLENOL 8 HOUR ARTHRITIS PAIN) 650 MG CR tablet Take 650 mg by mouth every 8 (eight) hours as needed for pain.     albuterol (PROVENTIL) (2.5 MG/3ML) 0.083% nebulizer solution Inhale 1 vial via nebulizer every 6 (six) hours as needed for wheezing or shortness of breath. 75 mL 0   albuterol (VENTOLIN HFA) 108 (90 Base) MCG/ACT inhaler Inhale 2 puffs into the lungs every 6 (six) hours as needed for wheezing or shortness of breath. 8 g 6   amLODipine (NORVASC) 5 MG tablet Take 1 tablet (5 mg total) by mouth 2 (two) times daily. 180 tablet 2   brimonidine (ALPHAGAN) 0.2 % ophthalmic solution Place 1 drop into both eyes 2 (two) times daily. Place 1 drop into both eyes 2 (two) times daily.     carvedilol (COREG) 12.5 MG tablet Take 1 tablet (12.5 mg total) by mouth 2 (two) times daily with a meal. No additional refill until office visit. 210  tablet 0   cetirizine (ZYRTEC) 10 MG tablet Take 10 mg by mouth every morning.     Cholecalciferol (VITAMIN D3 PO) Take 1 tablet by mouth daily at 6 (six) AM.     cyclobenzaprine (FLEXERIL) 10 MG tablet Take 10 mg by mouth 3 (three) times daily as needed for muscle spasms.     dorzolamide (TRUSOPT) 2 % ophthalmic solution Place 1 drop into both eyes 2 (two) times daily. Place 1 drop into both eyes 2 (two) times daily.     estradiol (ESTRACE) 0.1 MG/GM vaginal cream Apply 0.5 gm vaginally with the applicator of tip of the finger nightly for 2 weeks and then 2-3x weekly. 42.5 g 12   gabapentin (NEURONTIN) 300 MG capsule Take 300 mg by mouth at bedtime.     guaifenesin (MUCUS RELIEF) 400 MG TABS tablet Take 1,200 mg by mouth every morning.     hydrALAZINE (APRESOLINE) 50 MG tablet Take 0.5 tablets (25 mg total) by mouth daily as needed. 15 tablet 0   HYDROcodone-acetaminophen (NORCO/VICODIN) 5-325 MG tablet Take 1-2 tablets by mouth every 4 (four) hours as needed for moderate pain (pain score 4-6). 30 tablet 0   levothyroxine (SYNTHROID) 75 MCG tablet Take 75 mcg by mouth daily before breakfast.     LORazepam (ATIVAN) 0.5  MG tablet Take 0.5 mg by mouth 2 (two) times daily as needed for anxiety (tremors).     losartan (COZAAR) 100 MG tablet TAKE ONE TABLET BY MOUTH ONCE DAILY. 90 tablet 0   Multiple Vitamins-Minerals (MULTIVITAMIN WITH MINERALS) tablet Take 1 tablet by mouth 3 (three) times a week.     nitrofurantoin (MACRODANTIN) 50 MG capsule Take 1 capsule (50 mg total) by mouth at bedtime. 90 capsule 1   nitrofurantoin, macrocrystal-monohydrate, (MACROBID) 100 MG capsule Take 1 capsule (100 mg total) by mouth every 12 (twelve) hours. 14 capsule 0   pantoprazole (PROTONIX) 40 MG tablet Take 1 tablet (40 mg total) by mouth daily before breakfast. 90 tablet 3   Polyethyl Glycol-Propyl Glycol (SYSTANE OP) Apply 1 drop to eye as needed.     pravastatin (PRAVACHOL) 40 MG tablet Take 1 tablet (40 mg  total) by mouth at bedtime.     Probiotic Product (PROBIOTIC PO) Take 1 tablet by mouth daily at 6 (six) AM.     rOPINIRole (REQUIP) 0.5 MG tablet Take 0.5 mg by mouth at bedtime.     sertraline (ZOLOFT) 100 MG tablet Take 2 tablets by mouth every morning.     sodium chloride (OCEAN) 0.65 % nasal spray Place 2 sprays into both nostrils daily at 6 (six) AM. Use as needed     sodium chloride HYPERTONIC 3 % nebulizer solution Take as 3 mL neb as needed for cough or chest congestion 750 mL 12   Tiotropium Bromide-Olodaterol (STIOLTO RESPIMAT) 2.5-2.5 MCG/ACT AERS Inhale 2 puffs into the lungs daily. (Patient taking differently: Inhale 2 puffs into the lungs every morning.) 4 g 5   traMADol (ULTRAM) 50 MG tablet Take 1 tablet (50 mg total) by mouth every 6 (six) hours as needed. 30 tablet 0   traZODone (DESYREL) 50 MG tablet Take 50 mg by mouth at bedtime.     triamcinolone (NASACORT) 55 MCG/ACT AERO nasal inhaler Place 1 spray into the nose at bedtime.     No current facility-administered medications for this visit.    Allergies:   Prednisone, Tramadol, Adhesive [tape], Penicillins, and Sulfonamide derivatives   Social History:  The patient  reports that she has never smoked. She has been exposed to tobacco smoke. She has never used smokeless tobacco. She reports current alcohol use. She reports that she does not use drugs.   Family History:   family history includes Asthma in her daughter; Colon cancer in her paternal grandfather; Coronary artery disease in her mother; Diabetes in her father; Prostate cancer in her brother; Scoliosis in her daughter; Throat cancer in her mother.    Review of Systems: Review of Systems  Constitutional: Negative.   HENT: Negative.    Respiratory: Negative.    Cardiovascular: Negative.   Gastrointestinal: Negative.   Musculoskeletal: Negative.   Neurological: Negative.   Psychiatric/Behavioral: Negative.    All other systems reviewed and are  negative.   PHYSICAL EXAM: VS:  BP (!) 140/80 (BP Location: Left Arm, Patient Position: Sitting, Cuff Size: Normal)   Pulse 64   Ht 5\' 8"  (1.727 m)   Wt 153 lb 4 oz (69.5 kg)   SpO2 96%   BMI 23.30 kg/m  , BMI Body mass index is 23.3 kg/m. Constitutional:  oriented to person, place, and time. No distress.  HENT:  Head: Grossly normal Eyes:  no discharge. No scleral icterus.  Neck: No JVD, no carotid bruits  Cardiovascular: Regular rate and rhythm, no murmurs appreciated Pulmonary/Chest: Clear  to auscultation bilaterally, no wheezes or rails Abdominal: Soft.  no distension.  no tenderness.  Musculoskeletal: Normal range of motion Neurological:  normal muscle tone. Coordination normal. No atrophy Skin: Skin warm and dry Psychiatric: normal affect, pleasant  Recent Labs: 07/19/2022: BUN 8; Creatinine, Ser 0.75; Hemoglobin 9.6; Platelets 377; Potassium 3.1; Sodium 137    Lipid Panel Lab Results  Component Value Date   CHOL 201 (H) 03/01/2009   HDL 47 03/01/2009   LDLCALC 116 (H) 03/01/2009   TRIG 189 (H) 03/01/2009      Wt Readings from Last 3 Encounters:  05/20/23 153 lb 4 oz (69.5 kg)  05/16/23 154 lb (69.9 kg)  04/18/23 152 lb 3.2 oz (69 kg)     ASSESSMENT AND PLAN:  Hyperlipidemia LDL goal <70 - Plan: EKG 12-Lead On pravastatin, managed by PMD  Essential hypertension - Plan: EKG 12-Lead Labile history, high in the morning, lower in the day after medications No medication changes made, continue Coreg twice daily, losartan in the evening, amlodipine 5 in the evening If blood pressure runs high could take extra amlodipine Okay to take half Coreg first thing in the morning and additional half at noon.  Sometimes blood pressure dropping out on hold in the morning The above was discussed with her  Bronchiectasis Stable, chronic sputum Followed by pulmonary, periodic flares  Anxiety Lorazepam and trazodone provided by primary care Feel she has undiagnosed sleep  apnea waking her frequently in the nighttime contributing to symptoms Exacerbated by arthritic pain at nighttime and nocturia  OSA, Does not want workup ,  Sleep disorder exacerbated by nocturia, up 4-5 times a night   Total encounter time more than 30 minutes  Greater than 50% was spent in counseling and coordination of care with the patient   No orders of the defined types were placed in this encounter.    Signed, Dossie Arbour, M.D., Ph.D. 05/20/2023  [facility] [place], [place] [phone]

## 2023-05-20 ENCOUNTER — Ambulatory Visit: Payer: PPO | Attending: Cardiovascular Disease | Admitting: Cardiovascular Disease

## 2023-05-20 ENCOUNTER — Encounter: Payer: Self-pay | Admitting: Cardiovascular Disease

## 2023-05-20 VITALS — BP 140/80 | HR 64 | Ht 68.0 in | Wt 153.2 lb

## 2023-05-20 DIAGNOSIS — E039 Hypothyroidism, unspecified: Secondary | ICD-10-CM | POA: Diagnosis not present

## 2023-05-20 DIAGNOSIS — J479 Bronchiectasis, uncomplicated: Secondary | ICD-10-CM | POA: Diagnosis not present

## 2023-05-20 DIAGNOSIS — G479 Sleep disorder, unspecified: Secondary | ICD-10-CM

## 2023-05-20 DIAGNOSIS — I10 Essential (primary) hypertension: Secondary | ICD-10-CM

## 2023-05-20 DIAGNOSIS — E782 Mixed hyperlipidemia: Secondary | ICD-10-CM

## 2023-05-20 MED ORDER — AMLODIPINE BESYLATE 5 MG PO TABS: 5.0000 mg | ORAL_TABLET | Freq: Every day | ORAL | 3 refills | Status: DC

## 2023-05-20 NOTE — Patient Instructions (Signed)
Medication Instructions:  Amlodipine 5 mg daily with extra 5 mg as needed for pressure >170  If you need a refill on your cardiac medications before your next appointment, please call your pharmacy.   Lab work: No new labs needed  Testing/Procedures: No new testing needed  Follow-Up: At [facility], you and your health needs are our priority.  As part of our continuing mission to provide you with exceptional heart care, we have created designated Provider Care Teams.  These Care Teams include your primary Cardiologist (physician) and Advanced Practice Providers (APPs -  Physician Assistants and Nurse Practitioners) who all work together to provide you with the care you need, when you need it.  You will need a follow up appointment in 12 months  Providers on your designated Care Team:   Nicolasa Ducking, NP Eula Listen, PA-C Cadence Fransico Michael, [place]  COVID-19 Vaccine Information can be found at: [email or web address] For questions related to vaccine distribution or appointments, please email [email or web address] or call [phone].

## 2023-05-23 ENCOUNTER — Ambulatory Visit: Payer: PPO | Admitting: Urology

## 2023-05-23 ENCOUNTER — Other Ambulatory Visit: Payer: Self-pay | Admitting: Pulmonary Disease

## 2023-05-24 ENCOUNTER — Other Ambulatory Visit: Payer: Self-pay | Admitting: Pulmonary Disease

## 2023-05-24 NOTE — Telephone Encounter (Signed)
OK refill albuterol 0.083% nebulizer solution.  Patient last OV 01/22/2023.  Per chart note Dr. Kendrick Fries: We will plan on seeing you back in 6 months, sooner if needed

## 2023-05-29 ENCOUNTER — Telehealth: Payer: Self-pay | Admitting: Pulmonary Disease

## 2023-05-29 NOTE — Telephone Encounter (Signed)
Pt needs a refill for Tiotropium Bromide-Olodaterol (STIOLTO RESPIMAT) 2.5-2.5 MCG/ACT AERS

## 2023-05-30 ENCOUNTER — Telehealth: Payer: Self-pay | Admitting: Cardiovascular Disease

## 2023-05-30 DIAGNOSIS — I479 Paroxysmal tachycardia, unspecified: Secondary | ICD-10-CM

## 2023-05-30 MED ORDER — STIOLTO RESPIMAT 2.5-2.5 MCG/ACT IN AERS: 2.0000 | INHALATION_SPRAY | Freq: Every day | RESPIRATORY_TRACT | 5 refills | Status: DC

## 2023-05-30 NOTE — Telephone Encounter (Signed)
Returned the call to the patient. She stated that last night around 9 pm, she started feeling palpitations while she was sitting watching tv. Her heart rate was 137. She remained in the 120's through midnight. She denied chest pain, shortness of breath outside her norm but did feel tired.   She currently takes carvedilol 12.5 mg bid but did not take it last night due to her blood pressure being 107/70.  She is on an inhaler but has never gotten tachycardia from it before. She does not drink caffeine.   Her heart rate has been in the 80's all day today and she feels better but it still tired.

## 2023-05-30 NOTE — Telephone Encounter (Signed)
STAT if HR is under 50 or over 120 (normal HR is 60-100 beats per minute)  What is your heart rate? 137, 128, 121  Do you have a log of your heart rate readings (document readings)? Yes [phone]  Do you have any other symptoms? No

## 2023-05-30 NOTE — Telephone Encounter (Signed)
Refill for Tiotropium Bromide-Olodaterol (STIOLTO RESPIMAT) 2.5-2.5 MCG/ACT AERS sent to pharmacy.

## 2023-05-31 NOTE — Telephone Encounter (Signed)
Called and spoke with patient. Informed her of the following recommendations from Dr. Mariah Milling.  Can we order a Zio monitor for paroxysmal tachycardia Concern for atrial fibrillation Recommend she try to stay on her carvedilol twice a day Thx TG   Patient verbalizes understanding.

## 2023-06-10 ENCOUNTER — Other Ambulatory Visit: Payer: Self-pay | Admitting: Pulmonary Disease

## 2023-06-12 ENCOUNTER — Ambulatory Visit: Payer: PPO | Attending: Cardiovascular Disease

## 2023-06-12 ENCOUNTER — Other Ambulatory Visit: Payer: Self-pay | Admitting: Emergency Medicine

## 2023-06-12 DIAGNOSIS — I479 Paroxysmal tachycardia, unspecified: Secondary | ICD-10-CM

## 2023-06-12 NOTE — Telephone Encounter (Signed)
Called and spoke with patient. Informed patient that a new order has been placed for the Zio monitor. Patient verbalizes understanding.

## 2023-06-12 NOTE — Telephone Encounter (Signed)
Patient called to report she has not received her heart monitor as yet.

## 2023-06-13 ENCOUNTER — Ambulatory Visit: Payer: PPO | Admitting: Urology

## 2023-06-13 ENCOUNTER — Encounter: Payer: Self-pay | Admitting: Urology

## 2023-06-13 VITALS — BP 127/69 | HR 72

## 2023-06-13 DIAGNOSIS — N952 Postmenopausal atrophic vaginitis: Secondary | ICD-10-CM

## 2023-06-13 DIAGNOSIS — R3915 Urgency of urination: Secondary | ICD-10-CM | POA: Diagnosis not present

## 2023-06-13 DIAGNOSIS — R351 Nocturia: Secondary | ICD-10-CM

## 2023-06-13 DIAGNOSIS — N3281 Overactive bladder: Secondary | ICD-10-CM

## 2023-06-13 DIAGNOSIS — N3021 Other chronic cystitis with hematuria: Secondary | ICD-10-CM | POA: Diagnosis not present

## 2023-06-13 DIAGNOSIS — N3941 Urge incontinence: Secondary | ICD-10-CM

## 2023-06-13 LAB — URINALYSIS, ROUTINE W REFLEX MICROSCOPIC
Bilirubin, UA: NEGATIVE
Glucose, UA: NEGATIVE
Ketones, UA: NEGATIVE
Leukocytes,UA: NEGATIVE
Nitrite, UA: NEGATIVE
Protein,UA: NEGATIVE
RBC, UA: NEGATIVE
Specific Gravity, UA: 1.025 (ref 1.005–1.030)
Urobilinogen, Ur: 0.2 mg/dL (ref 0.2–1.0)
pH, UA: 6 (ref 5.0–7.5)

## 2023-06-13 LAB — BLADDER SCAN AMB NON-IMAGING: Scan Result: 0

## 2023-06-13 MED ORDER — SOLIFENACIN SUCCINATE 5 MG PO TABS: 5.0000 mg | ORAL_TABLET | Freq: Every day | ORAL | 11 refills | Status: DC

## 2023-06-13 MED ORDER — CIPROFLOXACIN HCL 500 MG PO TABS
500.0000 mg | ORAL_TABLET | Freq: Once | ORAL | Status: DC
Start: 2023-06-13 — End: 2023-06-13

## 2023-06-13 NOTE — Progress Notes (Signed)
Subjective: 1. Chronic cystitis with hematuria   2. Urgency of urination   3. Nocturia   4. Urge incontinence   5. Vaginal atrophy      Consult requested by Dwana Melena MD.  06/13/23: Christy Richardson returns otdya in f/u for cystoscopy to evaluate her recurrent UTI's with hematuria.  She has had no gross hematuria and has not had a recurrence since she was treated in May.  She remains on suppression without side effects other than a slightly increased cough.  She has some urgency during the day and nocturia x 5-6.  She wears a guard when she goes out but that is for the fecal soiling.    GU Hx: Christy Richardson is an 88 yo female who presents for evaluation of recurrent UTI's that got worse in November.  Her symptoms include dysuria with vaginal pain radiating into the arms.   She had symptoms over the weekend and was given fosfomycin for a previous infection but had one left over that she took this weekend.  She has nocturia 5-7x.  She has occasional UUI but no SUI.  She has had no hematuria.  She has had no fever with the infection but she has some LLQ discomfort.  She has some hesitancy.  The stream can be week.  She feels she empties but may have some intermittency.  She had a hysterectomy with an anterior repair in 1974.   She has had ESBL e. Coli on her cultures.   She has reported allergies to PCN and Sulfa years ago.  She a lumbar MRI in 12/23 and the kidneys were normal without stones or obstruction and a pelvic MRI that showed some colonic diverticuli but no bladder abnormalities.  She has some rectal prolapse with fecal soiling.  Her UA today has >30 WBC, >30 RBC and many bacteria.    She had normal renal function on labs last year.  ROS:  Review of Systems  HENT:  Positive for congestion.   Respiratory:  Positive for cough and shortness of breath.   Cardiovascular:  Positive for leg swelling.  Gastrointestinal:  Positive for diarrhea and heartburn.  Musculoskeletal:  Positive for back pain and joint pain.   Endo/Heme/Allergies:  Bruises/bleeds easily.  Psychiatric/Behavioral:  Positive for depression. The patient is nervous/anxious.   All other systems reviewed and are negative.   Allergies  Allergen Reactions   Prednisone     REACTION: Increase eye pressure with gloucoma. Broke out in rash after injection per Dr. Eulah Pont   Tramadol Itching   Adhesive [Tape]     Causes blisters/irritates skin-surgical tape   Penicillins     REACTION: Rash   Sulfonamide Derivatives     REACTION: Rash - not sure    Past Medical History:  Diagnosis Date   Adenomatous polyp of colon 03/2007   Allergic rhinitis    Anxiety    a.) on BZO (lorazepam) PRN   Arthritis    Blood transfusion 1974   Bronchiectasis (HCC)    Cancer (HCC)    left arm   Deaf    in right 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 Mild TR; b.) TTE 07/16/2019: EF 60-65%, triv AR, mild MR/TR, G1DD.   Diet-controlled type 2 diabetes mellitus (HCC)    Diverticulosis of colon (without mention of hemorrhage)    DOE (dyspnea on exertion)    Dyspnea  Fatigue    Fecal incontinence    GERD (gastroesophageal reflux disease)    Glaucoma    Goiter    right side   Heart murmur    History of stress test    a. 01/2014 Lexi MV: no ischemia/infarct.   Hyperkalemia    Hyperlipidemia    Hypertension    a. 11/2018 Renal artery duplex: No RAS.    Hypothyroidism    IBS (irritable bowel syndrome)    Migraine    Mitral valve prolapse    a. 01/2014 Echo: no MR/MVP noted.   Neck mass    right side/ no airway problems/ per pt has had since 2005 (goiter)   Nonspecific abnormal electrocardiogram (ECG) (EKG)    Palpitations    a. 01/2014 CardioNet: wore for 9 days (rash). Sinus tach - 100's. No SVT/AF.   Pneumonia    Polyp, sigmoid colon    Post-operative nausea and vomiting    Pulmonary fibrosis (HCC)    Pulmonary nodules    Recurrent UTI     Tachycardia    Tremor     Past Surgical History:  Procedure Laterality Date   ABDOMINAL HYSTERECTOMY     CARDIOVASCULAR STRESS TEST  09/19/2011   No scintigraphic evidence of inducible myocardial ischemia. Pharmacological stress test without chest pain or EKG changes for ischemia.   COLONOSCOPY     ESOPHAGOGASTRODUODENOSCOPY     MASTOIDECTOMY     rt ear/ as a teenager   OOPHORECTOMY     SKIN CANCER EXCISION Left    arm   TOTAL HIP ARTHROPLASTY  5/10   left   TOTAL HIP ARTHROPLASTY Right 07/10/2022   Procedure: TOTAL HIP ARTHROPLASTY ANTERIOR APPROACH;  Surgeon: Kennedy Bucker, MD;  Location: [facility] ORS;  Service: Orthopedics;  Laterality: Right;   TRANSTHORACIC ECHOCARDIOGRAM  09/19/2011   EF >55%, stage 1 diastolic dysfunction, mild tricuspid valve regurg    Social History   Socioeconomic History   Marital status: Widowed    Spouse name: Not on file   Number of children: 2   Years of education: 10th grade   Highest education level: Not on file  Occupational History   Occupation: Retired    Comment: Retail  Tobacco Use   Smoking status: Never    Passive exposure: Past   Smokeless tobacco: Never  Vaping Use   Vaping status: Never Used  Substance and Sexual Activity   Alcohol use: Yes    Comment: rare   Drug use: No   Sexual activity: Not Currently  Other Topics Concern   Not on file  Social History Narrative   Widowed since 2002. Lives with her Daughter.   Social Determinants of Health   Financial Resource Strain: Not on file  Food Insecurity: Not on file  Transportation Needs: Not on file  Physical Activity: Not on file  Stress: Not on file  Social Connections: Not on file  Intimate Partner Violence: Not on file    Family History  Problem Relation Age of Onset   Diabetes Father    Throat cancer Mother    Coronary artery disease Mother    Prostate cancer Brother    Asthma Daughter    Scoliosis Daughter    Colon cancer Paternal Grandfather    Esophageal  cancer Neg Hx    Rectal cancer Neg Hx    Stomach cancer Neg Hx     Anti-infectives: Anti-infectives (From admission, onward)    Start     Dose/Rate Route Frequency Ordered Stop  06/13/23 1330  CIPROFLOXACIN HCL 500 MG PO TABS  Status:  Discontinued        500 mg Oral  Once 06/13/23 1315 06/13/23 1357       Current Outpatient Medications  Medication Sig Dispense Refill   acetaminophen (TYLENOL 8 HOUR ARTHRITIS PAIN) 650 MG CR tablet Take 650 mg by mouth every 8 (eight) hours as needed for pain.     albuterol (PROVENTIL) (2.5 MG/3ML) 0.083% nebulizer solution Inhale 1 vial via nebulizer every 6 (six) hours as needed for wheezing or shortness of breath. 75 mL 0   albuterol (VENTOLIN HFA) 108 (90 Base) MCG/ACT inhaler Inhale 2 puffs into the lungs every 6 (six) hours as needed for wheezing or shortness of breath. 8 g 6   amLODipine (NORVASC) 5 MG tablet Take 1 tablet (5 mg total) by mouth daily. Take an extra tablet (5 mg) daily as needed for systolic blood pressure greater then 170. 180 tablet 3   brimonidine (ALPHAGAN) 0.2 % ophthalmic solution Place 1 drop into both eyes 2 (two) times daily. Place 1 drop into both eyes 2 (two) times daily.     carvedilol (COREG) 12.5 MG tablet Take 1 tablet (12.5 mg total) by mouth 2 (two) times daily with a meal. No additional refill until office visit. 210 tablet 0   cetirizine (ZYRTEC) 10 MG tablet Take 10 mg by mouth every morning.     Cholecalciferol (VITAMIN D3 PO) Take 1 tablet by mouth daily at 6 (six) AM.     cyclobenzaprine (FLEXERIL) 10 MG tablet Take 10 mg by mouth 3 (three) times daily as needed for muscle spasms.     dorzolamide (TRUSOPT) 2 % ophthalmic solution Place 1 drop into both eyes 2 (two) times daily. Place 1 drop into both eyes 2 (two) times daily.     estradiol (ESTRACE) 0.1 MG/GM vaginal cream Apply 0.5 gm vaginally with the applicator of tip of the finger nightly for 2 weeks and then 2-3x weekly. 42.5 g 12   gabapentin  (NEURONTIN) 300 MG capsule Take 300 mg by mouth at bedtime.     guaifenesin (MUCUS RELIEF) 400 MG TABS tablet Take 1,200 mg by mouth every morning.     hydrALAZINE (APRESOLINE) 50 MG tablet Take 0.5 tablets (25 mg total) by mouth daily as needed. 15 tablet 0   HYDROcodone-acetaminophen (NORCO/VICODIN) 5-325 MG tablet Take 1-2 tablets by mouth every 4 (four) hours as needed for moderate pain (pain score 4-6). 30 tablet 0   levothyroxine (SYNTHROID) 75 MCG tablet Take 75 mcg by mouth daily before breakfast.     LORazepam (ATIVAN) 0.5 MG tablet Take 0.5 mg by mouth 2 (two) times daily as needed for anxiety (tremors).     losartan (COZAAR) 100 MG tablet TAKE ONE TABLET BY MOUTH ONCE DAILY. 90 tablet 0   Multiple Vitamins-Minerals (MULTIVITAMIN WITH MINERALS) tablet Take 1 tablet by mouth 3 (three) times a week.     nitrofurantoin (MACRODANTIN) 50 MG capsule Take 1 capsule (50 mg total) by mouth at bedtime. 90 capsule 1   nitrofurantoin, macrocrystal-monohydrate, (MACROBID) 100 MG capsule Take 1 capsule (100 mg total) by mouth every 12 (twelve) hours. 14 capsule 0   pantoprazole (PROTONIX) 40 MG tablet Take 1 tablet (40 mg total) by mouth daily before breakfast. 90 tablet 3   Polyethyl Glycol-Propyl Glycol (SYSTANE OP) Apply 1 drop to eye as needed.     pravastatin (PRAVACHOL) 40 MG tablet Take 1 tablet (40 mg total) by mouth at  bedtime.     Probiotic Product (PROBIOTIC PO) Take 1 tablet by mouth daily at 6 (six) AM.     rOPINIRole (REQUIP) 0.5 MG tablet Take 0.5 mg by mouth at bedtime.     sertraline (ZOLOFT) 100 MG tablet Take 2 tablets by mouth every morning.     sodium chloride (OCEAN) 0.65 % nasal spray Place 2 sprays into both nostrils daily at 6 (six) AM. Use as needed     sodium chloride HYPERTONIC 3 % nebulizer solution Take as 3 mL neb as needed for cough or chest congestion 750 mL 12   solifenacin (VESICARE) 5 MG tablet Take 1 tablet (5 mg total) by mouth daily. 30 tablet 11   Tiotropium  Bromide-Olodaterol (STIOLTO RESPIMAT) 2.5-2.5 MCG/ACT AERS Inhale 2 puffs into the lungs daily. 4 g 5   traMADol (ULTRAM) 50 MG tablet Take 1 tablet (50 mg total) by mouth every 6 (six) hours as needed. 30 tablet 0   traZODone (DESYREL) 50 MG tablet Take 50 mg by mouth at bedtime.     triamcinolone (NASACORT) 55 MCG/ACT AERO nasal inhaler Place 1 spray into the nose at bedtime.     No current facility-administered medications for this visit.     Objective: Vital signs in last 24 hours: BP 127/69   Pulse 72   Intake/Output from previous day: No intake/output data recorded. Intake/Output this shift: @IOTHISSHIFT @   Physical Exam Vitals reviewed.  Constitutional:      Appearance: Normal appearance.  Neurological:     Mental Status: She is alert.     Lab Results:  No results found for this or any previous visit (from the past 24 hour(s)).   BMET No results for input(s): "NA", "K", "CL", "CO2", "GLUCOSE", "BUN", "CREATININE", "CALCIUM" in the last 72 hours. PT/INR No results for input(s): "LABPROT", "INR" in the last 72 hours. ABG No results for input(s): "PHART", "HCO3" in the last 72 hours.  Invalid input(s): "PCO2", "PO2" Cath UA is clear.  Studies/Results: No results found. I have reviewed the lumbar and pelvic MRI's.    Procedure: Flexible cystoscopy.  She was prepped with betadine and the bladder was drained with a straight cath.  Her PVR was 15-52ml.  The cystoscope was inserted without difficulty although she does have introital stenosis and vaginal atrophy.   The urethra is normal.  The bladder wall was smooth and pale with very minimal erythema.  No tumors, stone or significant inflammation was noted.  The UO's were normal.   Assessment/Plan: Chronic cystitis with hematuria.   Cystoscopy was unremarkable.  I will have her continue the macrodantin for another month and return in 2-3 months.   Vaginal atrophy.  She is on topical estrogen and I don't see the  caruncle today.     OAB dry.   She would like to consider a generic med.  I will  try vesicare 5mg . Side effects reviewed.    Meds ordered this encounter  Medications   DISCONTD: ciprofloxacin (CIPRO) tablet 500 mg   solifenacin (VESICARE) 5 MG tablet    Sig: Take 1 tablet (5 mg total) by mouth daily.    Dispense:  30 tablet    Refill:  11     Orders Placed This Encounter  Procedures   Urinalysis, Routine w reflex microscopic   Cystoscopy   BLADDER SCAN AMB NON-IMAGING     Return in about 3 months (around 09/13/2023).    CC: Dr. Dwana Melena.      Jonny Ruiz  Annabell Howells 06/13/2023

## 2023-06-13 NOTE — Progress Notes (Signed)
Pt is prepped for and in and out catherization. Patient was cleaned and prepped in a sterle fashion with betadine. A 14 fr catheter foley was inserted. Urine return was note 50 ml.  Performed by Kennyth Lose, CMA  Urine sent Lab

## 2023-06-17 ENCOUNTER — Telehealth: Payer: Self-pay | Admitting: Cardiovascular Disease

## 2023-06-17 NOTE — Telephone Encounter (Signed)
Pt would like a callback regarding Heart monitor. Pt states that she has sensitive skin and may not be able to wear monitor. Please advise

## 2023-06-17 NOTE — Telephone Encounter (Signed)
Spoke to the patient, she has received her heart monitor however, she has intolerance to adhesive tape. Pt stated once exposed to adhesive tape she develops blusters, pt also stated in the past her skin will peel while removing the adhesive tape. Pt stated she is not going to wear the monitor. Will forward to MD and nurse for advise.

## 2023-06-18 DIAGNOSIS — E039 Hypothyroidism, unspecified: Secondary | ICD-10-CM | POA: Diagnosis not present

## 2023-06-18 DIAGNOSIS — R809 Proteinuria, unspecified: Secondary | ICD-10-CM | POA: Diagnosis not present

## 2023-06-18 DIAGNOSIS — E785 Hyperlipidemia, unspecified: Secondary | ICD-10-CM | POA: Diagnosis not present

## 2023-06-18 DIAGNOSIS — R7303 Prediabetes: Secondary | ICD-10-CM | POA: Diagnosis not present

## 2023-06-18 NOTE — Telephone Encounter (Signed)
Pt's daughter wants to find out a way to monitor the pt's heart rate without irritating her skin. Please advise.

## 2023-06-18 NOTE — Telephone Encounter (Signed)
Patients daughter advised of Dr. Windell Hummingbird recommendations and verbalized understanding. She states they will mail monitor back since patient refused to wear due to skin irritation. Advised patients daughter to give our office a call if she experiences more frequent arrhythmias to evaluate the need for a loop monitor.   Antonieta Iba, MD  Jani Gravel, RN17 hours ago (5:47 PM)    She can send a monitor back if it will cause skin irritation I think we try to stay on the carvedilol twice a day for rhythm control If she has additional episodes of breakthrough arrhythmia, Only other option would be implantable loop monitor echoes under the skin This will be done by EP if she has more episodes Thx TG

## 2023-06-24 DIAGNOSIS — M1611 Unilateral primary osteoarthritis, right hip: Secondary | ICD-10-CM | POA: Diagnosis not present

## 2023-06-24 DIAGNOSIS — R7303 Prediabetes: Secondary | ICD-10-CM | POA: Diagnosis not present

## 2023-06-24 DIAGNOSIS — N3281 Overactive bladder: Secondary | ICD-10-CM | POA: Diagnosis not present

## 2023-06-24 DIAGNOSIS — E039 Hypothyroidism, unspecified: Secondary | ICD-10-CM | POA: Diagnosis not present

## 2023-06-24 DIAGNOSIS — N189 Chronic kidney disease, unspecified: Secondary | ICD-10-CM | POA: Diagnosis not present

## 2023-06-24 DIAGNOSIS — E785 Hyperlipidemia, unspecified: Secondary | ICD-10-CM | POA: Diagnosis not present

## 2023-06-24 DIAGNOSIS — J479 Bronchiectasis, uncomplicated: Secondary | ICD-10-CM | POA: Diagnosis not present

## 2023-06-24 DIAGNOSIS — I129 Hypertensive chronic kidney disease with stage 1 through stage 4 chronic kidney disease, or unspecified chronic kidney disease: Secondary | ICD-10-CM | POA: Diagnosis not present

## 2023-06-24 DIAGNOSIS — I82451 Acute embolism and thrombosis of right peroneal vein: Secondary | ICD-10-CM | POA: Diagnosis not present

## 2023-06-24 DIAGNOSIS — F411 Generalized anxiety disorder: Secondary | ICD-10-CM | POA: Diagnosis not present

## 2023-06-24 DIAGNOSIS — I10 Essential (primary) hypertension: Secondary | ICD-10-CM | POA: Diagnosis not present

## 2023-06-24 DIAGNOSIS — R809 Proteinuria, unspecified: Secondary | ICD-10-CM | POA: Diagnosis not present

## 2023-07-01 DIAGNOSIS — G2581 Restless legs syndrome: Secondary | ICD-10-CM | POA: Diagnosis not present

## 2023-07-02 ENCOUNTER — Other Ambulatory Visit: Payer: Self-pay | Admitting: Pulmonary Disease

## 2023-07-03 ENCOUNTER — Telehealth: Payer: Self-pay | Admitting: Pulmonary Disease

## 2023-07-03 MED ORDER — ALBUTEROL SULFATE (2.5 MG/3ML) 0.083% IN NEBU: 2.5000 mg | INHALATION_SOLUTION | Freq: Four times a day (QID) | RESPIRATORY_TRACT | 1 refills | Status: DC | PRN

## 2023-07-03 NOTE — Telephone Encounter (Signed)
Patient's daughter called to get patient's medicine filled because she use to be a patient of Dr. Kendrick Fries, but recently saw Dr. Tonia Brooms.  She was told that she would need an office visit, but she had just had a visit with [facility] in February.  Patient would like to know if she still needs to see Dr. Tonia Brooms in order to get her medication. Please advise and call patient's daughter, Pedro Earls, to discuss at [phone]

## 2023-07-03 NOTE — Telephone Encounter (Signed)
Pt. Daughter calling mother only has 3 days left of neb. Vials  needs more refills please advise

## 2023-07-03 NOTE — Telephone Encounter (Signed)
Spoke to daughter and she stated her mom needed another refill of albuterol (PROVENTIL) (2.5 MG/3ML) 0.083% nebulizer solution. I informed the daughter that a refill was sent 06/10/23 and that her mother does need a 6 mos office visit which Dr Kendrick Fries noted on the AVS before he left the practice. Pt's daughter verbalized understanding. I also called the [facility] and spoke to the pharmacist and the pharmacist stated pt did pick up her medication and she will be due for a refill soon. So, I sent in a refill for pt. NFN.

## 2023-07-05 ENCOUNTER — Telehealth: Payer: Self-pay | Admitting: Pulmonary Disease

## 2023-07-06 MED ORDER — ALBUTEROL SULFATE (2.5 MG/3ML) 0.083% IN NEBU: 2.5000 mg | INHALATION_SOLUTION | Freq: Four times a day (QID) | RESPIRATORY_TRACT | 2 refills | Status: DC | PRN

## 2023-07-06 NOTE — Telephone Encounter (Signed)
Mychart message sent on 07/06/23:  Ms. Christy Richardson,   I have refilled your albuterol nebulizer. Your last appointment was on 01/22/23. Unfortunately Dr. Kendrick Fries no longer works at [facility]. We can schedule you for follow-up with another physician or NP later this fall. One of the staff will reach out to you next week for an appointment.   Thank you for your patience.

## 2023-08-07 ENCOUNTER — Encounter: Payer: Self-pay | Admitting: Pulmonary Disease

## 2023-08-07 ENCOUNTER — Ambulatory Visit: Payer: PPO | Admitting: Pulmonary Disease

## 2023-08-07 VITALS — BP 120/80 | HR 68 | Ht 68.0 in | Wt 156.8 lb

## 2023-08-07 DIAGNOSIS — R911 Solitary pulmonary nodule: Secondary | ICD-10-CM | POA: Diagnosis not present

## 2023-08-07 DIAGNOSIS — J479 Bronchiectasis, uncomplicated: Secondary | ICD-10-CM

## 2023-08-07 NOTE — Patient Instructions (Signed)
Thank you for visiting Dr. Tonia Brooms at [facility]. Today we recommend the following:  Continue albuterol, flutter valve and vest therapy.   Return in about 1 year (around 08/06/2024) for with APP or Dr. Tonia Brooms.    Please do your part to reduce the spread of COVID-19.

## 2023-08-07 NOTE — Progress Notes (Signed)
Synopsis: Referred in December 2020 for former patient Dr. Kendrick Fries, PCP: Benita Stabile, MD  Subjective:   PATIENT ID: Christy Richardson GENDER: female DOB: 1935-09-13, MRN: [id number]  Chief Complaint  Patient presents with   Follow-up    F/up on lov    Last seen by Kandice Robinsons for bronchiectasis flare.  Followed by Dr. Kendrick Fries for bronchiectasis and fibrosis.  Patient with recent CT scan of the chest in November with bilateral waxing and waning tree-in-bud nodularity small pulmonary nodules 5 mm in size.  Last seen by Dr. Kendrick Fries in May 2019.  She had childhood pertussis.  She has been doing pulmonary rehabilitation up until Covid.  Currently managed with flutter valve.  She is also using Advair.  OV 11/11/2019: Here today to establish care with new primary pulmonary provider.  Patient doing well today.  She still does have daily sputum production.  She has been using her flutter valve.  Most recently has not used it daily but if she remembers to she will use it 2-3 times a day.  She has no one at home to help her with postural drainage or CPT.  She was unable to obtain sputum cultures recently.  No fevers chills night sweats weight loss.  No hemoptysis recently.  During her last exacerbation she did have a few streaks of blood.  OV 03/17/2019: Patient with bronchiectasis.  Here with ongoing daily sputum production.  She does feels that the Stiolto is helping however she is unable to qualify for financial assistance and the medication is too expensive for her to continue on a regular basis.  She was also unable to afford vest therapy.  Insurance stated they would cover it however was still on a charge for $130 a month and she could not afford this.  She still deals with daily cough and sputum production.  She does not usually routinely use her nebulizer.  Mainly because she does not like dragging all of the equipment out.  Patient denies fevers night sweats weight loss.  Patient denies hemoptysis  OV  10/03/2020: follow up for bronchiectasis.  She has been using her flutter valve as well as vest therapy regularly.  She is seeing significant improvement in her dyspnea and shortness of breath with use of Stiolto inhaler.  She denies hemoptysis.  Otherwise have been doing well.  She does still hate the daily sputum production.  She is concerned about losing her Stiolto due to cost.  So she did bring paperwork in for financial assistance from [employer]  OV 08/07/2023: Here today for follow-up.  Patient has bronchiectasis.  Currently managed with flutter valve and vest therapy.  She has not been using her vest therapy regularly.  She states it does help at times but it is heavy and difficult for her to wear.  Otherwise managed okay with Stiolto daily and as needed albuterol.  From respiratory standpoint she is doing better and has not had any recent exacerbations.     Past Medical History:  Diagnosis Date   Adenomatous polyp of colon 03/2007   Allergic rhinitis    Anxiety    a.) on BZO (lorazepam) PRN   Arthritis    Blood transfusion 1974   Bronchiectasis (HCC)    Cancer (HCC)    left arm   Deaf    in right ear-hearing aid in left   Depression    Diastolic dysfunction    a.) TTE 01/2014: EF 60-65%, no rwma, Gr1 DD, Ao sclerosis w/o  stenosis. Mild TR; b.) TTE 07/16/2019: EF 60-65%, triv AR, mild MR/TR, G1DD.   Diet-controlled type 2 diabetes mellitus (HCC)    Diverticulosis of colon (without mention of hemorrhage)    DOE (dyspnea on exertion)    Dyspnea    Fatigue    Fecal incontinence    GERD (gastroesophageal reflux disease)    Glaucoma    Goiter    right side   Heart murmur    History of stress test    a. 01/2014 Lexi MV: no ischemia/infarct.   Hyperkalemia    Hyperlipidemia    Hypertension    a. 11/2018 Renal artery duplex: No RAS.    Hypothyroidism    IBS (irritable bowel syndrome)    Migraine    Mitral valve prolapse    a. 01/2014 Echo: no MR/MVP noted.   Neck mass    right side/  no airway problems/ per pt has had since 2005 (goiter)   Nonspecific abnormal electrocardiogram (ECG) (EKG)    Palpitations    a. 01/2014 CardioNet: wore for 9 days (rash). Sinus tach - 100's. No SVT/AF.   Pneumonia    Polyp, sigmoid colon    Post-operative nausea and vomiting    Pulmonary fibrosis (HCC)    Pulmonary nodules    Recurrent UTI    Tachycardia    Tremor      Family History  Problem Relation Age of Onset   Diabetes Father    Throat cancer Mother    Coronary artery disease Mother    Prostate cancer Brother    Asthma Daughter    Scoliosis Daughter    Colon cancer Paternal Grandfather    Esophageal cancer Neg Hx    Rectal cancer Neg Hx    Stomach cancer Neg Hx      Past Surgical History:  Procedure Laterality Date   ABDOMINAL HYSTERECTOMY     CARDIOVASCULAR STRESS TEST  09/19/2011   No scintigraphic evidence of inducible myocardial ischemia. Pharmacological stress test without chest pain or EKG changes for ischemia.   COLONOSCOPY     ESOPHAGOGASTRODUODENOSCOPY     MASTOIDECTOMY     rt ear/ as a teenager   OOPHORECTOMY     SKIN CANCER EXCISION Left    arm   TOTAL HIP ARTHROPLASTY  5/10   left   TOTAL HIP ARTHROPLASTY Right 07/10/2022   Procedure: TOTAL HIP ARTHROPLASTY ANTERIOR APPROACH;  Surgeon: Kennedy Bucker, MD;  Location: [facility] ORS;  Service: Orthopedics;  Laterality: Right;   TRANSTHORACIC ECHOCARDIOGRAM  09/19/2011   EF >55%, stage 1 diastolic dysfunction, mild tricuspid valve regurg    Social History   Socioeconomic History   Marital status: Widowed    Spouse name: Not on file   Number of children: 2   Years of education: 10th grade   Highest education level: Not on file  Occupational History   Occupation: Retired    Comment: Retail  Tobacco Use   Smoking status: Never    Passive exposure: Past   Smokeless tobacco: Never  Vaping Use   Vaping status: Never Used  Substance and Sexual Activity   Alcohol use: Yes    Comment: rare   Drug use:  No   Sexual activity: Not Currently  Other Topics Concern   Not on file  Social History Narrative   Widowed since 2002. Lives with her Daughter.   Social Determinants of Health   Financial Resource Strain: Not on file  Food Insecurity: Not on file  Transportation Needs: Not  on file  Physical Activity: Not on file  Stress: Not on file  Social Connections: Not on file  Intimate Partner Violence: Not on file     Allergies  Allergen Reactions   Prednisone     REACTION: Increase eye pressure with gloucoma. Broke out in rash after injection per Dr. Eulah Pont   Tramadol Itching   Adhesive [Tape]     Causes blisters/irritates skin-surgical tape   Penicillins     REACTION: Rash   Sulfonamide Derivatives     REACTION: Rash - not sure     Outpatient Medications Prior to Visit  Medication Sig Dispense Refill   acetaminophen (TYLENOL 8 HOUR ARTHRITIS PAIN) 650 MG CR tablet Take 650 mg by mouth every 8 (eight) hours as needed for pain.     albuterol (PROVENTIL) (2.5 MG/3ML) 0.083% nebulizer solution Take 3 mLs (2.5 mg total) by nebulization every 6 (six) hours as needed for wheezing or shortness of breath. 360 mL 2   albuterol (VENTOLIN HFA) 108 (90 Base) MCG/ACT inhaler Inhale 2 puffs into the lungs every 6 (six) hours as needed for wheezing or shortness of breath. 8 g 6   amLODipine (NORVASC) 5 MG tablet Take 1 tablet (5 mg total) by mouth daily. Take an extra tablet (5 mg) daily as needed for systolic blood pressure greater then 170. 180 tablet 3   brimonidine (ALPHAGAN) 0.2 % ophthalmic solution Place 1 drop into both eyes 2 (two) times daily. Place 1 drop into both eyes 2 (two) times daily.     carvedilol (COREG) 12.5 MG tablet Take 1 tablet (12.5 mg total) by mouth 2 (two) times daily with a meal. No additional refill until office visit. 210 tablet 0   cetirizine (ZYRTEC) 10 MG tablet Take 10 mg by mouth every morning.     Cholecalciferol (VITAMIN D3 PO) Take 1 tablet by mouth daily at 6  (six) AM.     cyclobenzaprine (FLEXERIL) 10 MG tablet Take 10 mg by mouth 3 (three) times daily as needed for muscle spasms.     dorzolamide (TRUSOPT) 2 % ophthalmic solution Place 1 drop into both eyes 2 (two) times daily. Place 1 drop into both eyes 2 (two) times daily.     estradiol (ESTRACE) 0.1 MG/GM vaginal cream Apply 0.5 gm vaginally with the applicator of tip of the finger nightly for 2 weeks and then 2-3x weekly. 42.5 g 12   gabapentin (NEURONTIN) 300 MG capsule Take 300 mg by mouth at bedtime.     guaifenesin (MUCUS RELIEF) 400 MG TABS tablet Take 1,200 mg by mouth every morning.     hydrALAZINE (APRESOLINE) 50 MG tablet Take 0.5 tablets (25 mg total) by mouth daily as needed. 15 tablet 0   HYDROcodone-acetaminophen (NORCO/VICODIN) 5-325 MG tablet Take 1-2 tablets by mouth every 4 (four) hours as needed for moderate pain (pain score 4-6). 30 tablet 0   levothyroxine (SYNTHROID) 75 MCG tablet Take 75 mcg by mouth daily before breakfast.     LORazepam (ATIVAN) 0.5 MG tablet Take 0.5 mg by mouth 2 (two) times daily as needed for anxiety (tremors).     losartan (COZAAR) 100 MG tablet TAKE ONE TABLET BY MOUTH ONCE DAILY. 90 tablet 0   Multiple Vitamins-Minerals (MULTIVITAMIN WITH MINERALS) tablet Take 1 tablet by mouth 3 (three) times a week.     nitrofurantoin (MACRODANTIN) 50 MG capsule Take 1 capsule (50 mg total) by mouth at bedtime. 90 capsule 1   nitrofurantoin, macrocrystal-monohydrate, (MACROBID) 100 MG capsule  Take 1 capsule (100 mg total) by mouth every 12 (twelve) hours. 14 capsule 0   pantoprazole (PROTONIX) 40 MG tablet Take 1 tablet (40 mg total) by mouth daily before breakfast. 90 tablet 3   Polyethyl Glycol-Propyl Glycol (SYSTANE OP) Apply 1 drop to eye as needed.     pravastatin (PRAVACHOL) 40 MG tablet Take 1 tablet (40 mg total) by mouth at bedtime.     Probiotic Product (PROBIOTIC PO) Take 1 tablet by mouth daily at 6 (six) AM.     rOPINIRole (REQUIP) 0.5 MG tablet Take  0.5 mg by mouth at bedtime.     sertraline (ZOLOFT) 100 MG tablet Take 2 tablets by mouth every morning.     sodium chloride (OCEAN) 0.65 % nasal spray Place 2 sprays into both nostrils daily at 6 (six) AM. Use as needed     sodium chloride HYPERTONIC 3 % nebulizer solution Take as 3 mL neb as needed for cough or chest congestion 750 mL 12   solifenacin (VESICARE) 5 MG tablet Take 1 tablet (5 mg total) by mouth daily. 30 tablet 11   Tiotropium Bromide-Olodaterol (STIOLTO RESPIMAT) 2.5-2.5 MCG/ACT AERS Inhale 2 puffs into the lungs daily. 4 g 5   traMADol (ULTRAM) 50 MG tablet Take 1 tablet (50 mg total) by mouth every 6 (six) hours as needed. 30 tablet 0   traZODone (DESYREL) 50 MG tablet Take 50 mg by mouth at bedtime.     triamcinolone (NASACORT) 55 MCG/ACT AERO nasal inhaler Place 1 spray into the nose at bedtime.     No facility-administered medications prior to visit.    Review of Systems  Constitutional:  Negative for chills, fever, malaise/fatigue and weight loss.  HENT:  Negative for hearing loss, sore throat and tinnitus.   Eyes:  Negative for blurred vision and double vision.  Respiratory:  Positive for cough and sputum production. Negative for hemoptysis, shortness of breath, wheezing and stridor.   Cardiovascular:  Negative for chest pain, palpitations, orthopnea, leg swelling and PND.  Gastrointestinal:  Negative for abdominal pain, constipation, diarrhea, heartburn, nausea and vomiting.  Genitourinary:  Negative for dysuria, hematuria and urgency.  Musculoskeletal:  Negative for joint pain and myalgias.  Skin:  Negative for itching and rash.  Neurological:  Negative for dizziness, tingling, weakness and headaches.  Endo/Heme/Allergies:  Negative for environmental allergies. Does not bruise/bleed easily.  Psychiatric/Behavioral:  Negative for depression. The patient is not nervous/anxious and does not have insomnia.   All other systems reviewed and are  negative.    Objective:  Physical Exam Vitals reviewed.  Constitutional:      General: She is not in acute distress.    Appearance: She is well-developed.  HENT:     Head: Normocephalic and atraumatic.  Eyes:     General: No scleral icterus.    Conjunctiva/sclera: Conjunctivae normal.     Pupils: Pupils are equal, round, and reactive to light.  Neck:     Vascular: No JVD.     Trachea: No tracheal deviation.  Cardiovascular:     Rate and Rhythm: Normal rate and regular rhythm.     Heart sounds: Normal heart sounds. No murmur heard. Pulmonary:     Effort: Pulmonary effort is normal. No tachypnea, accessory muscle usage or respiratory distress.     Breath sounds: No stridor. No wheezing, rhonchi or rales.  Abdominal:     General: There is no distension.     Palpations: Abdomen is soft.     Tenderness:  There is no abdominal tenderness.  Musculoskeletal:        General: No tenderness.     Cervical back: Neck supple.  Lymphadenopathy:     Cervical: No cervical adenopathy.  Skin:    General: Skin is warm and dry.     Capillary Refill: Capillary refill takes less than 2 seconds.     Findings: No rash.  Neurological:     Mental Status: She is alert and oriented to person, place, and time.  Psychiatric:        Behavior: Behavior normal.      Vitals:   08/07/23 1425  BP: 120/80  Pulse: 68  SpO2: 95%  Weight: 156 lb 12.8 oz (71.1 kg)  Height: 5\' 8"  (1.727 m)   95% on RA BMI Readings from Last 3 Encounters:  08/07/23 23.84 kg/m  05/20/23 23.30 kg/m  05/16/23 23.42 kg/m   Wt Readings from Last 3 Encounters:  08/07/23 156 lb 12.8 oz (71.1 kg)  05/20/23 153 lb 4 oz (69.5 kg)  05/16/23 154 lb (69.9 kg)     CBC    Component Value Date/Time   WBC 7.2 07/19/2022 0057   RBC 3.20 (L) 07/19/2022 0057   HGB 9.6 (L) 07/19/2022 0057   HGB CANCELED 11/04/2018 1114   HCT 29.5 (L) 07/19/2022 0057   HCT CANCELED 11/04/2018 1114   PLT 377 07/19/2022 0057   PLT  CANCELED 11/04/2018 1114   MCV 92.2 07/19/2022 0057   MCH 30.0 07/19/2022 0057   MCHC 32.5 07/19/2022 0057   RDW 13.8 07/19/2022 0057   LYMPHSABS 1.1 11/06/2018 0754   LYMPHSABS CANCELED 11/04/2018 1114   MONOABS 0.3 11/06/2018 0754   EOSABS 0.2 11/06/2018 0754   EOSABS CANCELED 11/04/2018 1114   BASOSABS 0.0 11/06/2018 0754   BASOSABS CANCELED 11/04/2018 1114      Chest Imaging: November 2020: CT chest Bronchiectasis scattered tree-in-bud nodularities. The patient's images have been independently reviewed by me.    Pulmonary Functions Testing Results:    Latest Ref Rng & Units 01/22/2023    4:15 PM 02/25/2018   10:55 AM  PFT Results  FVC-Pre L  3.15   FVC-Predicted Pre % 98  106   Pre FEV1/FVC % % 64  61   FEV1-Pre L 1.72  1.92   FEV1-Predicted Pre % 83  86   DLCO uncorrected ml/min/mmHg 11.88  14.54   DLCO UNC% % 58  51   DLCO corrected ml/min/mmHg 11.88    DLCO COR %Predicted % 58    DLVA Predicted % 64  58   TLC L 5.43  5.53   TLC % Predicted % 98  100   RV % Predicted % 97  86     FeNO: None   Pathology: None   Echocardiogram: None   Heart Catheterization: None     Assessment & Plan:     ICD-10-CM   1. Bronchiectasis without complication (HCC)  J47.9     2. Lung nodule  R91.1        Discussion:  This is an 88 year old female daily sputum production with uncomplicated bronchiectasis.  She had nodules in the past that documented 2 years stability and now at her current age no additional surveillance would be recommended.  Plan: Continue Stiolto Continue vest therapy Continue flutter valve Continue as needed albuterol. Follow-up with [place] in 1 year.    Current Outpatient Medications:    acetaminophen (TYLENOL 8 HOUR ARTHRITIS PAIN) 650 MG CR tablet, Take 650 mg  by mouth every 8 (eight) hours as needed for pain., Disp: , Rfl:    albuterol (PROVENTIL) (2.5 MG/3ML) 0.083% nebulizer solution, Take 3 mLs (2.5 mg total) by nebulization every 6 (six)  hours as needed for wheezing or shortness of breath., Disp: 360 mL, Rfl: 2   albuterol (VENTOLIN HFA) 108 (90 Base) MCG/ACT inhaler, Inhale 2 puffs into the lungs every 6 (six) hours as needed for wheezing or shortness of breath., Disp: 8 g, Rfl: 6   amLODipine (NORVASC) 5 MG tablet, Take 1 tablet (5 mg total) by mouth daily. Take an extra tablet (5 mg) daily as needed for systolic blood pressure greater then 170., Disp: 180 tablet, Rfl: 3   brimonidine (ALPHAGAN) 0.2 % ophthalmic solution, Place 1 drop into both eyes 2 (two) times daily. Place 1 drop into both eyes 2 (two) times daily., Disp: , Rfl:    carvedilol (COREG) 12.5 MG tablet, Take 1 tablet (12.5 mg total) by mouth 2 (two) times daily with a meal. No additional refill until office visit., Disp: 210 tablet, Rfl: 0   cetirizine (ZYRTEC) 10 MG tablet, Take 10 mg by mouth every morning., Disp: , Rfl:    Cholecalciferol (VITAMIN D3 PO), Take 1 tablet by mouth daily at 6 (six) AM., Disp: , Rfl:    cyclobenzaprine (FLEXERIL) 10 MG tablet, Take 10 mg by mouth 3 (three) times daily as needed for muscle spasms., Disp: , Rfl:    dorzolamide (TRUSOPT) 2 % ophthalmic solution, Place 1 drop into both eyes 2 (two) times daily. Place 1 drop into both eyes 2 (two) times daily., Disp: , Rfl:    estradiol (ESTRACE) 0.1 MG/GM vaginal cream, Apply 0.5 gm vaginally with the applicator of tip of the finger nightly for 2 weeks and then 2-3x weekly., Disp: 42.5 g, Rfl: 12   gabapentin (NEURONTIN) 300 MG capsule, Take 300 mg by mouth at bedtime., Disp: , Rfl:    guaifenesin (MUCUS RELIEF) 400 MG TABS tablet, Take 1,200 mg by mouth every morning., Disp: , Rfl:    hydrALAZINE (APRESOLINE) 50 MG tablet, Take 0.5 tablets (25 mg total) by mouth daily as needed., Disp: 15 tablet, Rfl: 0   HYDROcodone-acetaminophen (NORCO/VICODIN) 5-325 MG tablet, Take 1-2 tablets by mouth every 4 (four) hours as needed for moderate pain (pain score 4-6)., Disp: 30 tablet, Rfl: 0    levothyroxine (SYNTHROID) 75 MCG tablet, Take 75 mcg by mouth daily before breakfast., Disp: , Rfl:    LORazepam (ATIVAN) 0.5 MG tablet, Take 0.5 mg by mouth 2 (two) times daily as needed for anxiety (tremors)., Disp: , Rfl:    losartan (COZAAR) 100 MG tablet, TAKE ONE TABLET BY MOUTH ONCE DAILY., Disp: 90 tablet, Rfl: 0   Multiple Vitamins-Minerals (MULTIVITAMIN WITH MINERALS) tablet, Take 1 tablet by mouth 3 (three) times a week., Disp: , Rfl:    nitrofurantoin (MACRODANTIN) 50 MG capsule, Take 1 capsule (50 mg total) by mouth at bedtime., Disp: 90 capsule, Rfl: 1   nitrofurantoin, macrocrystal-monohydrate, (MACROBID) 100 MG capsule, Take 1 capsule (100 mg total) by mouth every 12 (twelve) hours., Disp: 14 capsule, Rfl: 0   pantoprazole (PROTONIX) 40 MG tablet, Take 1 tablet (40 mg total) by mouth daily before breakfast., Disp: 90 tablet, Rfl: 3   Polyethyl Glycol-Propyl Glycol (SYSTANE OP), Apply 1 drop to eye as needed., Disp: , Rfl:    pravastatin (PRAVACHOL) 40 MG tablet, Take 1 tablet (40 mg total) by mouth at bedtime., Disp: , Rfl:    Probiotic  Product (PROBIOTIC PO), Take 1 tablet by mouth daily at 6 (six) AM., Disp: , Rfl:    rOPINIRole (REQUIP) 0.5 MG tablet, Take 0.5 mg by mouth at bedtime., Disp: , Rfl:    sertraline (ZOLOFT) 100 MG tablet, Take 2 tablets by mouth every morning., Disp: , Rfl:    sodium chloride (OCEAN) 0.65 % nasal spray, Place 2 sprays into both nostrils daily at 6 (six) AM. Use as needed, Disp: , Rfl:    sodium chloride HYPERTONIC 3 % nebulizer solution, Take as 3 mL neb as needed for cough or chest congestion, Disp: 750 mL, Rfl: 12   solifenacin (VESICARE) 5 MG tablet, Take 1 tablet (5 mg total) by mouth daily., Disp: 30 tablet, Rfl: 11   Tiotropium Bromide-Olodaterol (STIOLTO RESPIMAT) 2.5-2.5 MCG/ACT AERS, Inhale 2 puffs into the lungs daily., Disp: 4 g, Rfl: 5   traMADol (ULTRAM) 50 MG tablet, Take 1 tablet (50 mg total) by mouth every 6 (six) hours as needed.,  Disp: 30 tablet, Rfl: 0   traZODone (DESYREL) 50 MG tablet, Take 50 mg by mouth at bedtime., Disp: , Rfl:    triamcinolone (NASACORT) 55 MCG/ACT AERO nasal inhaler, Place 1 spray into the nose at bedtime., Disp: , Rfl:    Josephine Igo, DO [facility] 08/07/2023 2:53 PM

## 2023-08-09 DIAGNOSIS — I872 Venous insufficiency (chronic) (peripheral): Secondary | ICD-10-CM | POA: Diagnosis not present

## 2023-08-09 DIAGNOSIS — G2581 Restless legs syndrome: Secondary | ICD-10-CM | POA: Diagnosis not present

## 2023-08-09 DIAGNOSIS — Z23 Encounter for immunization: Secondary | ICD-10-CM | POA: Diagnosis not present

## 2023-08-09 DIAGNOSIS — Z713 Dietary counseling and surveillance: Secondary | ICD-10-CM | POA: Diagnosis not present

## 2023-08-09 DIAGNOSIS — Z79899 Other long term (current) drug therapy: Secondary | ICD-10-CM | POA: Diagnosis not present

## 2023-08-09 DIAGNOSIS — Z6823 Body mass index (BMI) 23.0-23.9, adult: Secondary | ICD-10-CM | POA: Diagnosis not present

## 2023-08-09 DIAGNOSIS — K219 Gastro-esophageal reflux disease without esophagitis: Secondary | ICD-10-CM | POA: Diagnosis not present

## 2023-08-09 DIAGNOSIS — I10 Essential (primary) hypertension: Secondary | ICD-10-CM | POA: Diagnosis not present

## 2023-08-09 DIAGNOSIS — G629 Polyneuropathy, unspecified: Secondary | ICD-10-CM | POA: Diagnosis not present

## 2023-09-16 NOTE — Progress Notes (Deleted)
Name: Christy Richardson DOB: 02-23-1935 MRN: [id number]  History of Present Illness: Christy Richardson is a 88 y.o. female who presents today for follow up visit at [facility]. - GU history: 1. OAB with urinary frequency, nocturia, urgency, and urge incontinence. 2. Recurrent UTI. 3. Vaginal atrophy with introital stenosis. 4. Rectal prolapse with fecal incontinence. Wears pads.  Urine culture results in past 12 months: - 12/08/2022: Positive for E. coli - 04/18/2023: Positive for E. coli  At last visit with Dr. Annabell Howells on 06/13/2023: - Cystoscopy was unremarkable. - The plan was: 1. Continue Nitrofurantoin 50 mg daily. 2. Continue topical vaginal estrogen cream use. 3. Start Vesicare 5 mg daily.   Since last visit: ***  Today: She reports ***  She {Actions; denies-reports:120008} increased urinary urgency, frequency, dysuria, gross hematuria, straining to void, or sensations of incomplete emptying.  She {Actions; denies-reports:120008} flank pain. She {Actions; denies-reports:120008} abdominal pain. She {Actions; denies-reports:120008} fevers. She {Actions; denies-reports:120008} nausea/ vomiting.   Fall Screening: Do you usually have a device to assist in your mobility? {yes/no:20286} ***cane / ***walker / ***wheelchair   Medications: Current Outpatient Medications  Medication Sig Dispense Refill   acetaminophen (TYLENOL 8 HOUR ARTHRITIS PAIN) 650 MG CR tablet Take 650 mg by mouth every 8 (eight) hours as needed for pain.     albuterol (PROVENTIL) (2.5 MG/3ML) 0.083% nebulizer solution Take 3 mLs (2.5 mg total) by nebulization every 6 (six) hours as needed for wheezing or shortness of breath. 360 mL 2   albuterol (VENTOLIN HFA) 108 (90 Base) MCG/ACT inhaler Inhale 2 puffs into the lungs every 6 (six) hours as needed for wheezing or shortness of breath. 8 g 6   amLODipine (NORVASC) 5 MG tablet Take 1 tablet (5 mg total) by mouth daily. Take an extra tablet (5  mg) daily as needed for systolic blood pressure greater then 170. 180 tablet 3   brimonidine (ALPHAGAN) 0.2 % ophthalmic solution Place 1 drop into both eyes 2 (two) times daily. Place 1 drop into both eyes 2 (two) times daily.     carvedilol (COREG) 12.5 MG tablet Take 1 tablet (12.5 mg total) by mouth 2 (two) times daily with a meal. No additional refill until office visit. 210 tablet 0   cetirizine (ZYRTEC) 10 MG tablet Take 10 mg by mouth every morning.     Cholecalciferol (VITAMIN D3 PO) Take 1 tablet by mouth daily at 6 (six) AM.     cyclobenzaprine (FLEXERIL) 10 MG tablet Take 10 mg by mouth 3 (three) times daily as needed for muscle spasms.     dorzolamide (TRUSOPT) 2 % ophthalmic solution Place 1 drop into both eyes 2 (two) times daily. Place 1 drop into both eyes 2 (two) times daily.     estradiol (ESTRACE) 0.1 MG/GM vaginal cream Apply 0.5 gm vaginally with the applicator of tip of the finger nightly for 2 weeks and then 2-3x weekly. 42.5 g 12   gabapentin (NEURONTIN) 300 MG capsule Take 300 mg by mouth at bedtime.     guaifenesin (MUCUS RELIEF) 400 MG TABS tablet Take 1,200 mg by mouth every morning.     hydrALAZINE (APRESOLINE) 50 MG tablet Take 0.5 tablets (25 mg total) by mouth daily as needed. 15 tablet 0   HYDROcodone-acetaminophen (NORCO/VICODIN) 5-325 MG tablet Take 1-2 tablets by mouth every 4 (four) hours as needed for moderate pain (pain score 4-6). 30 tablet 0   levothyroxine (SYNTHROID) 75 MCG tablet Take 75 mcg by mouth  daily before breakfast.     LORazepam (ATIVAN) 0.5 MG tablet Take 0.5 mg by mouth 2 (two) times daily as needed for anxiety (tremors).     losartan (COZAAR) 100 MG tablet TAKE ONE TABLET BY MOUTH ONCE DAILY. 90 tablet 0   Multiple Vitamins-Minerals (MULTIVITAMIN WITH MINERALS) tablet Take 1 tablet by mouth 3 (three) times a week.     nitrofurantoin (MACRODANTIN) 50 MG capsule Take 1 capsule (50 mg total) by mouth at bedtime. 90 capsule 1   nitrofurantoin,  macrocrystal-monohydrate, (MACROBID) 100 MG capsule Take 1 capsule (100 mg total) by mouth every 12 (twelve) hours. 14 capsule 0   pantoprazole (PROTONIX) 40 MG tablet Take 1 tablet (40 mg total) by mouth daily before breakfast. 90 tablet 3   Polyethyl Glycol-Propyl Glycol (SYSTANE OP) Apply 1 drop to eye as needed.     pravastatin (PRAVACHOL) 40 MG tablet Take 1 tablet (40 mg total) by mouth at bedtime.     Probiotic Product (PROBIOTIC PO) Take 1 tablet by mouth daily at 6 (six) AM.     rOPINIRole (REQUIP) 0.5 MG tablet Take 0.5 mg by mouth at bedtime.     sertraline (ZOLOFT) 100 MG tablet Take 2 tablets by mouth every morning.     sodium chloride (OCEAN) 0.65 % nasal spray Place 2 sprays into both nostrils daily at 6 (six) AM. Use as needed     sodium chloride HYPERTONIC 3 % nebulizer solution Take as 3 mL neb as needed for cough or chest congestion 750 mL 12   solifenacin (VESICARE) 5 MG tablet Take 1 tablet (5 mg total) by mouth daily. 30 tablet 11   Tiotropium Bromide-Olodaterol (STIOLTO RESPIMAT) 2.5-2.5 MCG/ACT AERS Inhale 2 puffs into the lungs daily. 4 g 5   traMADol (ULTRAM) 50 MG tablet Take 1 tablet (50 mg total) by mouth every 6 (six) hours as needed. 30 tablet 0   traZODone (DESYREL) 50 MG tablet Take 50 mg by mouth at bedtime.     triamcinolone (NASACORT) 55 MCG/ACT AERO nasal inhaler Place 1 spray into the nose at bedtime.     No current facility-administered medications for this visit.    Allergies: Allergies  Allergen Reactions   Prednisone     REACTION: Increase eye pressure with gloucoma. Broke out in rash after injection per Dr. Eulah Pont   Tramadol Itching   Adhesive [Tape]     Causes blisters/irritates skin-surgical tape   Penicillins     REACTION: Rash   Sulfonamide Derivatives     REACTION: Rash - not sure    Past Medical History:  Diagnosis Date   Adenomatous polyp of colon 03/2007   Allergic rhinitis    Anxiety    a.) on BZO (lorazepam) PRN   Arthritis     Blood transfusion 1974   Bronchiectasis (HCC)    Cancer (HCC)    left arm   Deaf    in right 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 Mild TR; b.) TTE 07/16/2019: EF 60-65%, triv AR, mild MR/TR, G1DD.   Diet-controlled type 2 diabetes mellitus (HCC)    Diverticulosis of colon (without mention of hemorrhage)    DOE (dyspnea on exertion)    Dyspnea    Fatigue    Fecal incontinence    GERD (gastroesophageal reflux disease)    Glaucoma    Goiter    right side  Heart murmur    History of stress test    a. 01/2014 Lexi MV: no ischemia/infarct.   Hyperkalemia    Hyperlipidemia    Hypertension    a. 11/2018 Renal artery duplex: No RAS.    Hypothyroidism    IBS (irritable bowel syndrome)    Migraine    Mitral valve prolapse    a. 01/2014 Echo: no MR/MVP noted.   Neck mass    right side/ no airway problems/ per pt has had since 2005 (goiter)   Nonspecific abnormal electrocardiogram (ECG) (EKG)    Palpitations    a. 01/2014 CardioNet: wore for 9 days (rash). Sinus tach - 100's. No SVT/AF.   Pneumonia    Polyp, sigmoid colon    Post-operative nausea and vomiting    Pulmonary fibrosis (HCC)    Pulmonary nodules    Recurrent UTI    Tachycardia    Tremor    Past Surgical History:  Procedure Laterality Date   ABDOMINAL HYSTERECTOMY     CARDIOVASCULAR STRESS TEST  09/19/2011   No scintigraphic evidence of inducible myocardial ischemia. Pharmacological stress test without chest pain or EKG changes for ischemia.   COLONOSCOPY     ESOPHAGOGASTRODUODENOSCOPY     MASTOIDECTOMY     rt ear/ as a teenager   OOPHORECTOMY     SKIN CANCER EXCISION Left    arm   TOTAL HIP ARTHROPLASTY  5/10   left   TOTAL HIP ARTHROPLASTY Right 07/10/2022   Procedure: TOTAL HIP ARTHROPLASTY ANTERIOR APPROACH;  Surgeon: Kennedy Bucker, MD;  Location: [facility] ORS;  Service: Orthopedics;  Laterality: Right;    TRANSTHORACIC ECHOCARDIOGRAM  09/19/2011   EF >55%, stage 1 diastolic dysfunction, mild tricuspid valve regurg   Family History  Problem Relation Age of Onset   Diabetes Father    Throat cancer Mother    Coronary artery disease Mother    Prostate cancer Brother    Asthma Daughter    Scoliosis Daughter    Colon cancer Paternal Grandfather    Esophageal cancer Neg Hx    Rectal cancer Neg Hx    Stomach cancer Neg Hx    Social History   Socioeconomic History   Marital status: Widowed    Spouse name: Not on file   Number of children: 2   Years of education: 10th grade   Highest education level: Not on file  Occupational History   Occupation: Retired    Comment: Retail  Tobacco Use   Smoking status: Never    Passive exposure: Past   Smokeless tobacco: Never  Vaping Use   Vaping status: Never Used  Substance and Sexual Activity   Alcohol use: Yes    Comment: rare   Drug use: No   Sexual activity: Not Currently  Other Topics Concern   Not on file  Social History Narrative   Widowed since 2002. Lives with her Daughter.   Social Determinants of Health   Financial Resource Strain: Not on file  Food Insecurity: Not on file  Transportation Needs: Not on file  Physical Activity: Not on file  Stress: Not on file  Social Connections: Not on file  Intimate Partner Violence: Not on file    Review of Systems*** Constitutional: Patient denies any unintentional weight loss or change in strength lntegumentary: Patient denies any rashes or pruritus Eyes: Patient denies ***dry eyes ENT: Patient ***denies dry mouth Cardiovascular: Patient denies chest pain or syncope Respiratory: Patient denies shortness of breath Gastrointestinal: Patient ***denies nausea, vomiting, constipation,  or diarrhea Musculoskeletal: Patient denies muscle cramps or weakness Neurologic: Patient denies convulsions or seizures Allergic/Immunologic: Patient denies recent allergic  reaction(s) Hematologic/Lymphatic: Patient denies bleeding tendencies Endocrine: Patient denies heat/cold intolerance  GU: As per HPI.  OBJECTIVE There were no vitals filed for this visit. There is no height or weight on file to calculate BMI.  Physical Examination*** Constitutional: No obvious distress; patient is non-toxic appearing  Cardiovascular: No visible lower extremity edema.  Respiratory: The patient does not have audible wheezing/stridor; respirations do not appear labored  Gastrointestinal: Abdomen non-distended Musculoskeletal: Normal ROM of UEs  Skin: No obvious rashes/open sores  Neurologic: CN 2-12 grossly intact Psychiatric: Answered questions appropriately with normal affect  Hematologic/Lymphatic/Immunologic: No obvious bruises or sites of spontaneous bleeding  UA: ***negative *** WBC/hpf, *** RBC/hpf, bacteria (***) PVR: *** ml  ASSESSMENT No diagnosis found. ***  Will plan for follow up in *** months / ***1 year or sooner if needed. Pt verbalized understanding and agreement. All questions were answered.  PLAN Advised the following: 1. *** 2. ***No follow-ups on file.  No orders of the defined types were placed in this encounter.   It has been explained that the patient is to follow regularly with their PCP in addition to all other providers involved in their care and to follow instructions provided by these respective offices. Patient advised to contact urology clinic if any urologic-pertaining questions, concerns, new symptoms or problems arise in the interim period.  There are no Patient Instructions on file for this visit.  Electronically signed by:  Donnita Falls, FNP   09/16/23    3:44 PM

## 2023-09-19 ENCOUNTER — Ambulatory Visit: Payer: PPO | Admitting: Urology

## 2023-09-23 ENCOUNTER — Other Ambulatory Visit: Payer: Self-pay | Admitting: Nurse Practitioner

## 2023-09-24 ENCOUNTER — Ambulatory Visit: Payer: PPO | Admitting: Urology

## 2023-09-24 DIAGNOSIS — N952 Postmenopausal atrophic vaginitis: Secondary | ICD-10-CM

## 2023-09-24 DIAGNOSIS — N3281 Overactive bladder: Secondary | ICD-10-CM

## 2023-09-24 DIAGNOSIS — N390 Urinary tract infection, site not specified: Secondary | ICD-10-CM

## 2023-10-07 ENCOUNTER — Other Ambulatory Visit (HOSPITAL_COMMUNITY): Payer: Self-pay

## 2023-10-17 ENCOUNTER — Telehealth: Payer: Self-pay | Admitting: Pulmonary Disease

## 2023-10-17 NOTE — Telephone Encounter (Signed)
Christy Richardson daughter is calling bc she has been having a issue getting the albuterol. It is needing a Prior Auth for the medication. Pharmacy stated they are sending over a prior Auth for Medicare to cover it for her.

## 2023-10-17 NOTE — Telephone Encounter (Signed)
Kim daughter states Albuterol solution needs to be released. Pharmacy is [facility], Needs to be sent back into [facility]. Kim phone number is [phone].

## 2023-10-18 MED ORDER — ALBUTEROL SULFATE HFA 108 (90 BASE) MCG/ACT IN AERS: 2.0000 | INHALATION_SPRAY | Freq: Four times a day (QID) | RESPIRATORY_TRACT | 6 refills | Status: DC | PRN

## 2023-10-18 MED ORDER — ALBUTEROL SULFATE (2.5 MG/3ML) 0.083% IN NEBU: 2.5000 mg | INHALATION_SOLUTION | Freq: Four times a day (QID) | RESPIRATORY_TRACT | 2 refills | Status: DC | PRN

## 2023-10-18 NOTE — Telephone Encounter (Signed)
Patients daughter calling again needing albuterol solution for patient's nebulizer. Selena Batten states the original request was faxed on 10/30. Please advise.

## 2023-10-18 NOTE — Telephone Encounter (Signed)
Order was sent with diagnosis code, just incase

## 2023-10-18 NOTE — Telephone Encounter (Signed)
Can we get a prior auth for this pts albuterol ?   Atc pt daughter no answer, was not able to lvmm due to busy tone

## 2023-10-21 ENCOUNTER — Other Ambulatory Visit (HOSPITAL_COMMUNITY): Payer: Self-pay

## 2023-10-21 NOTE — Telephone Encounter (Signed)
Nebulizer solutions to be billed under Medicare Part B

## 2023-10-29 ENCOUNTER — Telehealth: Payer: Self-pay | Admitting: Pulmonary Disease

## 2023-10-29 NOTE — Telephone Encounter (Signed)
Pt calling for patient assistance forms

## 2023-10-30 ENCOUNTER — Telehealth: Payer: Self-pay | Admitting: Pulmonary Disease

## 2023-10-30 ENCOUNTER — Other Ambulatory Visit (HOSPITAL_COMMUNITY): Payer: Self-pay

## 2023-10-30 NOTE — Telephone Encounter (Signed)
Needs a prior auth sent to [facility] for albuterol (PROVENTIL) (2.5 MG/3ML) 0.083% nebulizer solution

## 2023-10-30 NOTE — Telephone Encounter (Signed)
Prior authorization not required. Processing for $3.46 co-pay

## 2023-10-30 NOTE — Telephone Encounter (Signed)
Called and spoke with pt about patient assistance forms, pt wants papers mailed, placed in basket at front desk. Nfn

## 2023-11-05 DIAGNOSIS — J340 Abscess, furuncle and carbuncle of nose: Secondary | ICD-10-CM | POA: Diagnosis not present

## 2023-11-05 DIAGNOSIS — H7011 Chronic mastoiditis, right ear: Secondary | ICD-10-CM | POA: Diagnosis not present

## 2023-11-07 NOTE — Telephone Encounter (Signed)
Left detailed message on home line as stated in previous message no pa needed for albuterol neb solution. If anything else needed office contact #left.

## 2023-11-13 DIAGNOSIS — M3501 Sicca syndrome with keratoconjunctivitis: Secondary | ICD-10-CM | POA: Diagnosis not present

## 2023-11-13 DIAGNOSIS — H43813 Vitreous degeneration, bilateral: Secondary | ICD-10-CM | POA: Diagnosis not present

## 2023-11-13 DIAGNOSIS — H401131 Primary open-angle glaucoma, bilateral, mild stage: Secondary | ICD-10-CM | POA: Diagnosis not present

## 2023-12-02 MED ORDER — STIOLTO RESPIMAT 2.5-2.5 MCG/ACT IN AERS: 2.0000 | INHALATION_SPRAY | Freq: Every day | RESPIRATORY_TRACT | Status: DC

## 2023-12-02 NOTE — Addendum Note (Signed)
Addended by: Gay Filler T on: 12/02/2023 02:11 PM   Modules accepted: Orders

## 2023-12-18 ENCOUNTER — Telehealth: Payer: Self-pay | Admitting: Pulmonary Disease

## 2023-12-18 NOTE — Telephone Encounter (Signed)
 Patient needs refill of Stiloto. Patient assistance forms were to have been faxed during last appointment. The company has not received the form only half of it. This has been the third time that the patient has come across this issue. Please call daughter and advise.

## 2023-12-19 DIAGNOSIS — E785 Hyperlipidemia, unspecified: Secondary | ICD-10-CM | POA: Diagnosis not present

## 2023-12-19 DIAGNOSIS — E039 Hypothyroidism, unspecified: Secondary | ICD-10-CM | POA: Diagnosis not present

## 2023-12-19 DIAGNOSIS — R809 Proteinuria, unspecified: Secondary | ICD-10-CM | POA: Diagnosis not present

## 2023-12-19 DIAGNOSIS — R7303 Prediabetes: Secondary | ICD-10-CM | POA: Diagnosis not present

## 2023-12-19 MED ORDER — STIOLTO RESPIMAT 2.5-2.5 MCG/ACT IN AERS: 2.0000 | INHALATION_SPRAY | Freq: Every day | RESPIRATORY_TRACT | 5 refills | Status: DC

## 2023-12-19 NOTE — Telephone Encounter (Signed)
Paper work has been signed, prescription has been signed and faxed. Awaiting fax confirmation

## 2023-12-19 NOTE — Telephone Encounter (Signed)
Called and spoke with pt daughter Pedro Earls to update her about the paperwork. Will reach back out to sisters when paperwork is signed by [employer] and refaxed.

## 2023-12-19 NOTE — Telephone Encounter (Signed)
Mia daughter checking on message for Stiolto. Mia phone number is [phone].

## 2023-12-19 NOTE — Telephone Encounter (Signed)
Called and spoke with both daughters about paperwork being faxed to pt assistance. They verbalized understanding nfn

## 2023-12-19 NOTE — Telephone Encounter (Signed)
Called and spoke with pt daughter about refills as listed on DPR. Pt daughter stated her sister came and stayed until the faxes were printed, but yet [employer] only received half of the form. I will go check [employer] mail to look for forms and give Ms. Christy Richardson a call back.

## 2023-12-23 DIAGNOSIS — M1712 Unilateral primary osteoarthritis, left knee: Secondary | ICD-10-CM | POA: Diagnosis not present

## 2023-12-26 ENCOUNTER — Emergency Department (HOSPITAL_COMMUNITY)
Admission: EM | Admit: 2023-12-26 | Discharge: 2023-12-26 | Disposition: A | Discharge status: Home / Self Care | Payer: PPO

## 2023-12-26 ENCOUNTER — Other Ambulatory Visit: Payer: Self-pay

## 2023-12-26 ENCOUNTER — Encounter (HOSPITAL_COMMUNITY): Payer: Self-pay | Admitting: Emergency Medicine

## 2023-12-26 DIAGNOSIS — M7989 Other specified soft tissue disorders: Secondary | ICD-10-CM

## 2023-12-26 DIAGNOSIS — M79605 Pain in left leg: Secondary | ICD-10-CM | POA: Diagnosis present

## 2023-12-26 DIAGNOSIS — Z79890 Hormone replacement therapy (postmenopausal): Secondary | ICD-10-CM | POA: Insufficient documentation

## 2023-12-26 DIAGNOSIS — E039 Hypothyroidism, unspecified: Secondary | ICD-10-CM | POA: Insufficient documentation

## 2023-12-26 DIAGNOSIS — R2242 Localized swelling, mass and lump, left lower limb: Secondary | ICD-10-CM | POA: Diagnosis not present

## 2023-12-26 DIAGNOSIS — R600 Localized edema: Secondary | ICD-10-CM | POA: Diagnosis not present

## 2023-12-26 LAB — CBC
HCT: 45.5 % (ref 36.0–46.0)
Hemoglobin: 14.9 g/dL (ref 12.0–15.0)
MCH: 29.9 pg (ref 26.0–34.0)
MCHC: 32.7 g/dL (ref 30.0–36.0)
MCV: 91.2 fL (ref 80.0–100.0)
Platelets: 241 10*3/uL (ref 150–400)
RBC: 4.99 MIL/uL (ref 3.87–5.11)
RDW: 12.7 % (ref 11.5–15.5)
WBC: 7.7 10*3/uL (ref 4.0–10.5)
nRBC: 0 % (ref 0.0–0.2)

## 2023-12-26 LAB — BASIC METABOLIC PANEL
Anion gap: 10 (ref 5–15)
BUN: 14 mg/dL (ref 8–23)
CO2: 27 mmol/L (ref 22–32)
Calcium: 9.6 mg/dL (ref 8.9–10.3)
Chloride: 98 mmol/L (ref 98–111)
Creatinine, Ser: 0.91 mg/dL (ref 0.44–1.00)
GFR, Estimated: >60 mL/min (ref >60)
Glucose, Bld: 99 mg/dL (ref 70–99)
Potassium: 5.6 mmol/L — ABNORMAL HIGH (ref 3.5–5.1)
Sodium: 135 mmol/L (ref 135–145)

## 2023-12-26 MED ORDER — APIXABAN 5 MG PO TABS
10.0000 mg | ORAL_TABLET | Freq: Once | ORAL | Status: AC
  Administered 2023-12-26: 10 mg via ORAL
  Filled 2023-12-26: qty 2

## 2023-12-26 MED ORDER — HYDROCODONE-ACETAMINOPHEN 5-325 MG PO TABS
1.0000 | ORAL_TABLET | Freq: Once | ORAL | Status: AC
  Administered 2023-12-26: 1 via ORAL
  Filled 2023-12-26: qty 1

## 2023-12-26 NOTE — Discharge Instructions (Signed)
You were seen in the emergency room today for left leg swelling.  Unfortunately we do not have ultrasound available today.  I am concerned that you might have a blood clot.  Please return tomorrow for an ultrasound to rule out blood clot.  I would recommend alternating Tylenol and hydrocodone as discussed at home.  You can also use lidocaine patch.

## 2023-12-26 NOTE — ED Triage Notes (Signed)
Pt presents to the ED via pain with complaints of L knee pain x 3 days. Pt has a bakers cyst to the posterior L knee and her provider administered a steroid shot and she has had an increase in pain since that time. She notes taking pain meds today without any relief. Minimal swelling noted to the L knee, no warmth nor redness noted. A&Ox4 at this time. Denies CP or SOB.

## 2023-12-26 NOTE — ED Provider Notes (Signed)
[place] EMERGENCY DEPARTMENT AT [facility] Provider Note   CSN: [id number] Arrival date & time: 12/26/23  1856     History  Chief Complaint  Patient presents with   Leg Pain    Christy Richardson is a 89 y.o. female with past medical history of hypothyroidism, hyperlipidemia, anxiety, depression, migraine, glaucoma presenting to the emergency room with complaint of left lower extremity pain for 3 days.  She was recent evaluated by orthopedics and treated for potential cyst.  She reports that her swelling seems to be getting worse.  She does have prior history of DVT after surgery on her right leg.  Because it is a provoked DVT she not on anticoagulation at this time.  She reports she is able to ambulate.  Does not endorse any chest pain, shortness of breath, fevers chills or cough.   Leg Pain      Home Medications Prior to Admission medications   Medication Sig Start Date End Date Taking? Authorizing Provider  acetaminophen (TYLENOL 8 HOUR ARTHRITIS PAIN) 650 MG CR tablet Take 650 mg by mouth every 8 (eight) hours as needed for pain.    [provider]  albuterol (PROVENTIL) (2.5 MG/3ML) 0.083% nebulizer solution Take 3 mLs (2.5 mg total) by nebulization every 6 (six) hours as needed for wheezing or shortness of breath. 10/18/23   Icard, Rachel Bo, DO  albuterol (VENTOLIN HFA) 108 (90 Base) MCG/ACT inhaler Inhale 2 puffs into the lungs every 6 (six) hours as needed for wheezing or shortness of breath. 10/18/23   Icard, Elige Radon L, DO  amLODipine (NORVASC) 5 MG tablet Take 1 tablet (5 mg total) by mouth daily. Take an extra tablet (5 mg) daily as needed for systolic blood pressure greater then 170. 05/20/23   Gollan, Tollie Pizza, MD  brimonidine (ALPHAGAN) 0.2 % ophthalmic solution Place 1 drop into both eyes 2 (two) times daily. Place 1 drop into both eyes 2 (two) times daily. 12/21/15   [provider]  carvedilol (COREG) 12.5 MG tablet Take 1 tablet (12.5 mg  total) by mouth 2 (two) times daily with a meal. 09/23/23   Gollan, Tollie Pizza, MD  cetirizine (ZYRTEC) 10 MG tablet Take 10 mg by mouth every morning. 03/30/11   Parrett, Virgel Bouquet, NP  Cholecalciferol (VITAMIN D3 PO) Take 1 tablet by mouth daily at 6 (six) AM.    [provider]  cyclobenzaprine (FLEXERIL) 10 MG tablet Take 10 mg by mouth 3 (three) times daily as needed for muscle spasms.    [provider]  dorzolamide (TRUSOPT) 2 % ophthalmic solution Place 1 drop into both eyes 2 (two) times daily. Place 1 drop into both eyes 2 (two) times daily. 11/03/15   [provider]  estradiol (ESTRACE) 0.1 MG/GM vaginal cream Apply 0.5 gm vaginally with the applicator of tip of the finger nightly for 2 weeks and then 2-3x weekly. 04/18/23   Bjorn Pippin, MD  gabapentin (NEURONTIN) 300 MG capsule Take 300 mg by mouth at bedtime.    [provider]  guaifenesin (MUCUS RELIEF) 400 MG TABS tablet Take 1,200 mg by mouth every morning.    [provider]  hydrALAZINE (APRESOLINE) 50 MG tablet Take 0.5 tablets (25 mg total) by mouth daily as needed. 09/27/22   Creig Hines, NP  HYDROcodone-acetaminophen (NORCO/VICODIN) 5-325 MG tablet Take 1-2 tablets by mouth every 4 (four) hours as needed for moderate pain (pain score 4-6). 07/13/22   Evon Slack, PA-C  levothyroxine (SYNTHROID) 75 MCG tablet Take 75 mcg by mouth daily before breakfast. 03/19/22   [provider]  LORazepam (ATIVAN) 0.5 MG tablet Take 0.5 mg by mouth 2 (two) times daily as needed for anxiety (tremors).    [provider]  losartan (COZAAR) 100 MG tablet TAKE ONE TABLET BY MOUTH ONCE DAILY. 05/06/23   Antonieta Iba, MD  Multiple Vitamins-Minerals (MULTIVITAMIN WITH MINERALS) tablet Take 1 tablet by mouth 3 (three) times a week.    [provider]  nitrofurantoin (MACRODANTIN) 50 MG capsule Take 1 capsule (50 mg total) by mouth at bedtime. 04/23/23   Bjorn Pippin, MD   nitrofurantoin, macrocrystal-monohydrate, (MACROBID) 100 MG capsule Take 1 capsule (100 mg total) by mouth every 12 (twelve) hours. 04/18/23   Bjorn Pippin, MD  pantoprazole (PROTONIX) 40 MG tablet Take 1 tablet (40 mg total) by mouth daily before breakfast. 06/20/18   Iva Boop, MD  Polyethyl Glycol-Propyl Glycol (SYSTANE OP) Apply 1 drop to eye as needed.    [provider]  pravastatin (PRAVACHOL) 40 MG tablet Take 1 tablet (40 mg total) by mouth at bedtime. 03/30/11   Parrett, Virgel Bouquet, NP  Probiotic Product (PROBIOTIC PO) Take 1 tablet by mouth daily at 6 (six) AM.    [provider]  rOPINIRole (REQUIP) 0.5 MG tablet Take 0.5 mg by mouth at bedtime. 09/05/22   [provider]  sertraline (ZOLOFT) 100 MG tablet Take 2 tablets by mouth every morning. 12/19/21   [provider]  sodium chloride (OCEAN) 0.65 % nasal spray Place 2 sprays into both nostrils daily at 6 (six) AM. Use as needed 03/30/11   Parrett, Tammy S, NP  sodium chloride HYPERTONIC 3 % nebulizer solution Take as 3 mL neb as needed for cough or chest congestion 02/08/23   Cobb, Ruby Cola, NP  solifenacin (VESICARE) 5 MG tablet Take 1 tablet (5 mg total) by mouth daily. 06/13/23   Bjorn Pippin, MD  Tiotropium Bromide-Olodaterol (STIOLTO RESPIMAT) 2.5-2.5 MCG/ACT AERS Inhale 2 puffs into the lungs daily. 12/02/23   Jetty Duhamel D, MD  Tiotropium Bromide-Olodaterol (STIOLTO RESPIMAT) 2.5-2.5 MCG/ACT AERS Inhale 2 puffs into the lungs daily. 12/19/23   Icard, Rachel Bo, DO  traMADol (ULTRAM) 50 MG tablet Take 1 tablet (50 mg total) by mouth every 6 (six) hours as needed. 07/13/22   Evon Slack, PA-C  traZODone (DESYREL) 50 MG tablet Take 50 mg by mouth at bedtime.    [provider]  triamcinolone (NASACORT) 55 MCG/ACT AERO nasal inhaler Place 1 spray into the nose at bedtime.    [provider]      Allergies    Prednisone, Tramadol, Adhesive [tape], Penicillins, and  Sulfonamide derivatives    Review of Systems   Review of Systems  Cardiovascular:  Positive for leg swelling.    Physical Exam Updated Vital Signs BP 136/84 (BP Location: Left Arm)   Pulse 92   Temp 98.1 F (36.7 C)   Resp 18   Ht 5\' 8"  (1.727 m)   Wt 69.9 kg   SpO2 100%   BMI 23.42 kg/m  Physical Exam Vitals and nursing note reviewed.  Constitutional:      General: She is not in acute distress.    Appearance: She is not toxic-appearing.  HENT:     Head: Normocephalic and atraumatic.  Eyes:     General: No scleral icterus.    Conjunctiva/sclera: Conjunctivae normal.  Cardiovascular:     Rate and  Rhythm: Normal rate and regular rhythm.     Pulses: Normal pulses.     Heart sounds: Normal heart sounds.  Pulmonary:     Effort: Pulmonary effort is normal. No respiratory distress.     Breath sounds: Normal breath sounds.  Abdominal:     General: Abdomen is flat. Bowel sounds are normal.     Palpations: Abdomen is soft.     Tenderness: There is no abdominal tenderness.  Musculoskeletal:     Left lower leg: Edema present.  Skin:    General: Skin is warm and dry.     Findings: No lesion.  Neurological:     General: No focal deficit present.     Mental Status: She is alert and oriented to person, place, and time. Mental status is at baseline.     ED Results / Procedures / Treatments   Labs (all labs ordered are listed, but only abnormal results are displayed) Labs Reviewed - No data to display  EKG None  Radiology No results found.  Procedures Procedures    Medications Ordered in ED Medications - No data to display  ED Course/ Medical Decision Making/ A&P                                 Medical Decision Making Amount and/or Complexity of Data Reviewed Labs: ordered.  Risk Prescription drug management.   Christy Richardson 89 y.o. presented today for right lower extermity edema. Working Ddx: dependent edema, venous insufficiency, thrombophlebitis,  secondary to medications, CHF, edema, AKI, nephrotic syndrome, dvt   R/o DDx:: These are considered less likely than current impression due to history of present illness, physical exam, labs/imaging findings.  Review of prior external notes: Prior note from earlier 2023 with positive DVT study  Unique Tests and My Interpretation:  CBC: no leukocytosis, no anemia BMP: Potassium 135, potassium is slightly elevated however suspect this is due to hemolyzed sample, no anion gap  Vascular US studies --no ultrasound tech today.  Will order ultrasound DVT study.  Problem List / ED Course / Critical interventions / Medication management  Reporting to emergency room with left lower extremity swelling.  She has history of DVT in the past.  Unfortunately ultrasound is unavailable today.  She does not endorse any shortness of breath, chest pain or chest tightness.  She has no history of PE she is not hypoxic and not tachycardic.  Labs are reassuring with normal hemoglobin and normal kidney function.  Will give first dose of Eliquis due to high suspicion of DVT.  Patient's symptoms improved after Norco.  She is neurovascularly intact and able to move extremity without any difficulty.  If patient's DVT study is within normal limits tomorrow she will follow-up with orthopedics as discussed.  She has at home medications to take in the meantime. I ordered medication including Eliquis, norco Reevaluation of the patient after these medicines showed that the patient stayed the same Patients vitals assessed. Upon arrival patient is hemodynamically stable.  I have reviewed the patients home medicines and have made adjustments as needed  Consult:      Plan:  With past medical history of DVT moderate suspicion for DVT.  Will give first dose of Eliquis here and have her follow-up tomorrow for outpatient DVT study.  Patient is stable for discharge.         Final Clinical Impression(s) / ED Diagnoses Final  diagnoses:  Left  leg swelling    Rx / DC Orders ED Discharge Orders          Ordered    Lower Ext Left Venous US       Comments: IMPORTANT PATIENT INSTRUCTIONS:  Your ED provider has recommended an Outpatient Ultrasound.  Please call [phone] to schedule an appointment.  If your appointment is scheduled for a Saturday, Sunday or holiday, please go to the [facility] Emergency Department Registration Desk at least 15 minutes prior to your appointment time and tell them you are there for an ultrasound.    If your appointment is scheduled for a weekday (Monday-Friday), please go directly to the [facility] Radiology Department at least 15 minutes prior to your appointment time and tell them you are there for an ultrasound.  Please call [phone] with questions.   12/26/23 2040              Abdur Hoglund, Horald Chestnut, PA-C 12/26/23 2230    Durwin Glaze, MD 12/26/23 2322

## 2023-12-27 ENCOUNTER — Ambulatory Visit (HOSPITAL_COMMUNITY)
Admission: RE | Admit: 2023-12-27 | Discharge: 2023-12-27 | Disposition: A | Discharge status: Home / Self Care | Payer: PPO | Source: Ambulatory Visit | Attending: Emergency Medicine | Admitting: Emergency Medicine

## 2023-12-27 DIAGNOSIS — M7989 Other specified soft tissue disorders: Secondary | ICD-10-CM | POA: Diagnosis not present

## 2023-12-27 DIAGNOSIS — M25562 Pain in left knee: Secondary | ICD-10-CM | POA: Diagnosis not present

## 2023-12-27 DIAGNOSIS — M79605 Pain in left leg: Secondary | ICD-10-CM | POA: Diagnosis not present

## 2023-12-27 DIAGNOSIS — M1712 Unilateral primary osteoarthritis, left knee: Secondary | ICD-10-CM | POA: Diagnosis not present

## 2023-12-27 NOTE — ED Provider Notes (Signed)
Pt returned for ultrasound.  Ultrasound shows no evidence of dvt.  I discussed results with pt.  Pt advised to follow up with her Orthopaedist for knee pain.  Pt has medication for pain at home   Christy Richardson 12/27/23 1122    Gerhard Munch, MD 12/27/23 [phone]

## 2023-12-30 ENCOUNTER — Other Ambulatory Visit (HOSPITAL_COMMUNITY): Payer: Self-pay | Admitting: Physician Assistant

## 2023-12-30 DIAGNOSIS — S83231A Complex tear of medial meniscus, current injury, right knee, initial encounter: Secondary | ICD-10-CM

## 2023-12-31 ENCOUNTER — Ambulatory Visit (HOSPITAL_COMMUNITY)
Admission: RE | Admit: 2023-12-31 | Discharge: 2023-12-31 | Disposition: A | Discharge status: Home / Self Care | Payer: PPO | Source: Ambulatory Visit | Attending: Physician Assistant | Admitting: Physician Assistant

## 2023-12-31 DIAGNOSIS — S83231A Complex tear of medial meniscus, current injury, right knee, initial encounter: Secondary | ICD-10-CM | POA: Diagnosis not present

## 2023-12-31 DIAGNOSIS — S83242A Other tear of medial meniscus, current injury, left knee, initial encounter: Secondary | ICD-10-CM | POA: Diagnosis not present

## 2023-12-31 DIAGNOSIS — M7122 Synovial cyst of popliteal space [Baker], left knee: Secondary | ICD-10-CM | POA: Diagnosis not present

## 2023-12-31 DIAGNOSIS — S82142A Displaced bicondylar fracture of left tibia, initial encounter for closed fracture: Secondary | ICD-10-CM | POA: Diagnosis not present

## 2023-12-31 DIAGNOSIS — M25462 Effusion, left knee: Secondary | ICD-10-CM | POA: Diagnosis not present

## 2024-01-01 DIAGNOSIS — M1712 Unilateral primary osteoarthritis, left knee: Secondary | ICD-10-CM | POA: Diagnosis not present

## 2024-01-15 DIAGNOSIS — M170 Bilateral primary osteoarthritis of knee: Secondary | ICD-10-CM | POA: Diagnosis not present

## 2024-02-05 ENCOUNTER — Encounter: Payer: Self-pay | Admitting: Cardiovascular Disease

## 2024-02-05 DIAGNOSIS — K219 Gastro-esophageal reflux disease without esophagitis: Secondary | ICD-10-CM | POA: Diagnosis not present

## 2024-02-05 DIAGNOSIS — G629 Polyneuropathy, unspecified: Secondary | ICD-10-CM | POA: Diagnosis not present

## 2024-02-05 DIAGNOSIS — G2581 Restless legs syndrome: Secondary | ICD-10-CM | POA: Diagnosis not present

## 2024-02-05 DIAGNOSIS — R809 Proteinuria, unspecified: Secondary | ICD-10-CM | POA: Diagnosis not present

## 2024-02-05 DIAGNOSIS — N189 Chronic kidney disease, unspecified: Secondary | ICD-10-CM | POA: Diagnosis not present

## 2024-02-05 DIAGNOSIS — E785 Hyperlipidemia, unspecified: Secondary | ICD-10-CM | POA: Diagnosis not present

## 2024-02-05 DIAGNOSIS — R7303 Prediabetes: Secondary | ICD-10-CM | POA: Diagnosis not present

## 2024-02-05 DIAGNOSIS — I10 Essential (primary) hypertension: Secondary | ICD-10-CM | POA: Diagnosis not present

## 2024-02-05 DIAGNOSIS — E039 Hypothyroidism, unspecified: Secondary | ICD-10-CM | POA: Diagnosis not present

## 2024-02-05 DIAGNOSIS — M1611 Unilateral primary osteoarthritis, right hip: Secondary | ICD-10-CM | POA: Diagnosis not present

## 2024-02-05 DIAGNOSIS — J479 Bronchiectasis, uncomplicated: Secondary | ICD-10-CM | POA: Diagnosis not present

## 2024-02-05 DIAGNOSIS — I129 Hypertensive chronic kidney disease with stage 1 through stage 4 chronic kidney disease, or unspecified chronic kidney disease: Secondary | ICD-10-CM | POA: Diagnosis not present

## 2024-02-26 DIAGNOSIS — M1712 Unilateral primary osteoarthritis, left knee: Secondary | ICD-10-CM | POA: Diagnosis not present

## 2024-03-02 ENCOUNTER — Other Ambulatory Visit (HOSPITAL_COMMUNITY): Payer: Self-pay | Admitting: Internal Medicine

## 2024-03-02 DIAGNOSIS — Z1231 Encounter for screening mammogram for malignant neoplasm of breast: Secondary | ICD-10-CM

## 2024-03-16 ENCOUNTER — Ambulatory Visit (HOSPITAL_COMMUNITY)
Admission: RE | Admit: 2024-03-16 | Discharge: 2024-03-16 | Disposition: A | Discharge status: Home / Self Care | Source: Ambulatory Visit | Attending: Internal Medicine | Admitting: Internal Medicine

## 2024-03-16 DIAGNOSIS — Z1231 Encounter for screening mammogram for malignant neoplasm of breast: Secondary | ICD-10-CM | POA: Insufficient documentation

## 2024-03-26 DIAGNOSIS — Z1283 Encounter for screening for malignant neoplasm of skin: Secondary | ICD-10-CM | POA: Diagnosis not present

## 2024-03-26 DIAGNOSIS — L820 Inflamed seborrheic keratosis: Secondary | ICD-10-CM | POA: Diagnosis not present

## 2024-03-26 DIAGNOSIS — D225 Melanocytic nevi of trunk: Secondary | ICD-10-CM | POA: Diagnosis not present

## 2024-03-26 DIAGNOSIS — L821 Other seborrheic keratosis: Secondary | ICD-10-CM | POA: Diagnosis not present

## 2024-04-01 ENCOUNTER — Encounter: Payer: Self-pay | Admitting: Cardiovascular Disease

## 2024-04-01 NOTE — Telephone Encounter (Signed)
 Called patient following MyChart message from patient's daughter reporting chest tightness, loss of appetite  and anxiety for past 2-3 weeks. Advised patient of signs and symptoms that she would need to go to ED. Patient scheduled for 5/2 with Laneta Pintos, NP.

## 2024-04-02 NOTE — Progress Notes (Signed)
 Office Visit    Patient Name: Christy Richardson Date of Encounter: 04/03/2024  Primary Care Provider:  Omie Bickers, MD Primary Cardiologist:  Belva Boyden, MD  Chief Complaint    89 y.o. female with a history of labile hypertension, anxiety, bronchiectasis, chronic dyspnea, GERD, goiter, hyperlipidemia, and diastolic dysfunction, presents for follow-up related to chest pain.  Past Medical History  Subjective   Past Medical History:  Diagnosis Date   Adenomatous polyp of colon 03/2007   Allergic rhinitis    Anxiety    a.) on BZO (lorazepam ) PRN   Arthritis    Blood transfusion 1974   Bronchiectasis (HCC)    Cancer (HCC)    left arm   Deaf    in right 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 Mild TR; b.) TTE 07/16/2019: EF 60-65%, triv AR, mild MR/TR, G1DD.   Diet-controlled type 2 diabetes mellitus (HCC)    Diverticulosis of colon (without mention of hemorrhage)    DOE (dyspnea on exertion)    Dyspnea    Fatigue    Fecal incontinence    GERD (gastroesophageal reflux disease)    Glaucoma    Goiter    right side   Heart murmur    History of stress test    a. 01/2014 Lexi MV: no ischemia/infarct.   Hyperkalemia    Hyperlipidemia    Hypertension    a. 11/2018 Renal artery duplex: No RAS.    Hypothyroidism    IBS (irritable bowel syndrome)    Migraine    Mitral valve prolapse    a. 01/2014 Echo: no MR/MVP noted.   Neck mass    right side/ no airway problems/ per pt has had since 2005 (goiter)   Nonspecific abnormal electrocardiogram (ECG) (EKG)    Palpitations    a. 01/2014 CardioNet: wore for 9 days (rash). Sinus tach - 100's. No SVT/AF.   Pneumonia    Polyp, sigmoid colon    Post-operative nausea and vomiting    Pulmonary fibrosis (HCC)    Pulmonary nodules    Recurrent UTI    Tachycardia    Tremor    Past Surgical History:  Procedure Laterality Date    ABDOMINAL HYSTERECTOMY     CARDIOVASCULAR STRESS TEST  09/19/2011   No scintigraphic evidence of inducible myocardial ischemia. Pharmacological stress test without chest pain or EKG changes for ischemia.   COLONOSCOPY     ESOPHAGOGASTRODUODENOSCOPY     MASTOIDECTOMY     rt ear/ as a teenager   OOPHORECTOMY     SKIN CANCER EXCISION Left    arm   TOTAL HIP ARTHROPLASTY  5/10   left   TOTAL HIP ARTHROPLASTY Right 07/10/2022   Procedure: TOTAL HIP ARTHROPLASTY ANTERIOR APPROACH;  Surgeon: Molli Angelucci, MD;  Location: [facility] ORS;  Service: Orthopedics;  Laterality: Right;   TRANSTHORACIC ECHOCARDIOGRAM  09/19/2011   EF >55%, stage 1 diastolic dysfunction, mild tricuspid valve regurg    Allergies  Allergies  Allergen Reactions   Prednisone     REACTION: Increase eye pressure with gloucoma. Broke out in rash after injection per Dr. Abigail Abler   Tramadol  Itching   Adhesive [Tape]     Causes blisters/irritates skin-surgical tape   Penicillins     REACTION: Rash   Sulfonamide Derivatives     REACTION: Rash - not sure  History of Present Illness      89 y.o. y/o female with the above past medical history including labile hypertension, mitral valve prolapse, anxiety, bronchiectasis, chronic dyspnea, GERD, goiter, hyperlipidemia, and diastolic dysfunction.  She previously underwent evaluation for dyspnea with stress testing (nonischemic) and echocardiogram (normal EF, grade 1 diastolic dysfunction) in March 2015.  In the setting of bronchiectasis and chronic dyspnea, she has been followed closely by pulmonology.  Hypertension has been somewhat difficult to manage in the setting of variable blood pressure recordings at home along with orthostatic symptoms.  Most recent echo in August 2020 showed an EF of 60 to 65% with impaired relaxation and normal RV function.  Mild MR and TR along with trivial AI were noted.  In June 2024, she called our office and reported palpitations and tachycardia.  A ZIO  monitor was ordered however, patient ended up not wearing.   Over the past 3 to 4 weeks, Christy Richardson has been experiencing epigastric and sternal discomfort associated with meals, that moves into her throat associated with a metallic taste.  Symptoms are reminiscent of GERD symptoms and have not happened with exertion.  There are no associated symptoms..  She does take Protonix  40 mg daily.  She also has had some left upper chest soreness and tenderness to palpation.  Symptoms last for 15 to 30 minutes after eating and often resolved with belching and Tums.  She has chronic, stable dyspnea on exertion in the setting of bronchiectasis.  She denies palpitations, PND, orthopnea, dizziness, syncope, or early satiety.  She sometimes notes mild ankle swelling.  She occasionally has headaches at night for which she will take a Tylenol .  Blood pressure is well-controlled today.  She says it is typically high first thing in the morning but stabilizes after her morning medications. Objective  Home Medications    Current Outpatient Medications  Medication Sig Dispense Refill   acetaminophen  (TYLENOL  8 HOUR ARTHRITIS PAIN) 650 MG CR tablet Take 650 mg by mouth every 8 (eight) hours as needed for pain.     albuterol  (PROVENTIL ) (2.5 MG/3ML) 0.083% nebulizer solution Take 3 mLs (2.5 mg total) by nebulization every 6 (six) hours as needed for wheezing or shortness of breath. 360 mL 2   albuterol  (VENTOLIN  HFA) 108 (90 Base) MCG/ACT inhaler Inhale 2 puffs into the lungs every 6 (six) hours as needed for wheezing or shortness of breath. 8 g 6   amLODipine  (NORVASC ) 5 MG tablet Take 1 tablet (5 mg total) by mouth daily. Take an extra tablet (5 mg) daily as needed for systolic blood pressure greater then 170. 180 tablet 3   brimonidine  (ALPHAGAN ) 0.2 % ophthalmic solution Place 1 drop into both eyes 2 (two) times daily. Place 1 drop into both eyes 2 (two) times daily.     carvedilol  (COREG ) 12.5 MG tablet Take 1 tablet  (12.5 mg total) by mouth 2 (two) times daily with a meal. 180 tablet 2   cetirizine (ZYRTEC) 10 MG tablet Take 10 mg by mouth every morning.     Cholecalciferol (VITAMIN D3 PO) Take 1 tablet by mouth daily at 6 (six) AM.     cyclobenzaprine  (FLEXERIL ) 10 MG tablet Take 10 mg by mouth 3 (three) times daily as needed for muscle spasms.     dorzolamide  (TRUSOPT ) 2 % ophthalmic solution Place 1 drop into both eyes 2 (two) times daily. Place 1 drop into both eyes 2 (two) times daily.     estradiol  (ESTRACE ) 0.1 MG/GM  vaginal cream Apply 0.5 gm vaginally with the applicator of tip of the finger nightly for 2 weeks and then 2-3x weekly. 42.5 g 12   gabapentin (NEURONTIN) 300 MG capsule Take 300 mg by mouth at bedtime.     gentamicin ointment (GARAMYCIN) 0.1 % as needed (nose irritation).     guaifenesin  (MUCUS RELIEF) 400 MG TABS tablet Take 1,200 mg by mouth every morning.     hydrALAZINE  (APRESOLINE ) 50 MG tablet Take 0.5 tablets (25 mg total) by mouth daily as needed. 15 tablet 0   levothyroxine  (SYNTHROID ) 75 MCG tablet Take 75 mcg by mouth daily before breakfast.     LORazepam  (ATIVAN ) 0.5 MG tablet Take 0.5 mg by mouth 2 (two) times daily as needed for anxiety (tremors).     losartan  (COZAAR ) 100 MG tablet TAKE ONE TABLET BY MOUTH ONCE DAILY. 90 tablet 0   Multiple Vitamins-Minerals (MULTIVITAMIN WITH MINERALS) tablet Take 1 tablet by mouth 3 (three) times a week.     Polyethyl Glycol-Propyl Glycol (SYSTANE OP) Apply 1 drop to eye as needed.     pravastatin  (PRAVACHOL ) 40 MG tablet Take 1 tablet (40 mg total) by mouth at bedtime.     Probiotic Product (PROBIOTIC PO) Take 1 tablet by mouth daily at 6 (six) AM.     rOPINIRole (REQUIP) 0.5 MG tablet Take 0.5 mg by mouth at bedtime.     sertraline  (ZOLOFT ) 100 MG tablet Take 2 tablets by mouth every morning.     sodium chloride  (OCEAN) 0.65 % nasal spray Place 2 sprays into both nostrils daily at 6 (six) AM. Use as needed     sodium chloride   HYPERTONIC 3 % nebulizer solution Take as 3 mL neb as needed for cough or chest congestion 750 mL 12   Tiotropium Bromide-Olodaterol (STIOLTO RESPIMAT ) 2.5-2.5 MCG/ACT AERS Inhale 2 puffs into the lungs daily.     Tiotropium Bromide-Olodaterol (STIOLTO RESPIMAT ) 2.5-2.5 MCG/ACT AERS Inhale 2 puffs into the lungs daily. 4 g 5   traMADol  (ULTRAM ) 50 MG tablet Take 1 tablet (50 mg total) by mouth every 6 (six) hours as needed. 30 tablet 0   traZODone  (DESYREL ) 50 MG tablet Take 50 mg by mouth at bedtime.     triamcinolone  (NASACORT ) 55 MCG/ACT AERO nasal inhaler Place 1 spray into the nose at bedtime.     pantoprazole  (PROTONIX ) 40 MG tablet Take 1 tablet (40 mg total) by mouth 2 (two) times daily.     No current facility-administered medications for this visit.     Physical Exam    VS:  BP 110/60 (BP Location: Left Arm)   Pulse 67   Ht 5\' 8"  (1.727 m)   Wt 158 lb (71.7 kg)   SpO2 95%   BMI 24.02 kg/m  , BMI Body mass index is 24.02 kg/m.       GEN: Well nourished, well developed, in no acute distress. HEENT: normal. Neck: Supple, no JVD, carotid bruits, or masses. Cardiac: RRR, no murmurs, rubs, or gallops. No clubbing, cyanosis, edema.  Radials 2+/PT 2+ and equal bilaterally.  Respiratory:  Respirations regular and unlabored, faint basilar crackles bilaterally. GI: Soft, nontender, nondistended, BS + x 4. MS: no deformity or atrophy. Skin: warm and dry, no rash. Neuro:  Strength and sensation are intact. Psych: Normal affect.  Accessory Clinical Findings    ECG personally reviewed by me today - EKG Interpretation Date/Time:  Friday Apr 03 2024 08:34:44 EDT Ventricular Rate:  67 PR Interval:  178 QRS  Duration:  76 QT Interval:  392 QTC Calculation: 414 R Axis:   -45  Text Interpretation: Normal sinus rhythm with sinus arrhythmia Left anterior fascicular block Nonspecific ST abnormality Confirmed by Laneta Pintos [phone]) on 04/03/2024 8:43:09 AM  - no acute  changes.  Lab Results  Component Value Date   WBC 7.7 12/26/2023   HGB 14.9 12/26/2023   HCT 45.5 12/26/2023   MCV 91.2 12/26/2023   PLT 241 12/26/2023   Lab Results  Component Value Date   CREATININE 0.91 12/26/2023   BUN 14 12/26/2023   NA 135 12/26/2023   K 5.6 (H) 12/26/2023   CL 98 12/26/2023   CO2 27 12/26/2023   Labs dated December 19, 2023 from [facility]:  TSH 2.12, free T4 1.34 Total cholesterol 151, triglycerides 86, HDL 56, LDL 79    Assessment & Plan    1.  Epigastric pain/GERD: Patient with prior history of GERD.  Over the past 3 to 4 weeks, she has had more frequent epigastric pain associated with meals and a metallic taste in her throat.  Symptoms last 15 to 30 minutes resolved with belching and Tums.  Symptoms are reminiscent of prior GERD symptoms.  She already takes Protonix  40 mg daily.  I asked her to increase Protonix  to 40 mg twice daily.  She will follow-up with her primary care provider within the next week or 2.  2.  Precordial pain/left chest tenderness: In the setting of above, she has also had left chest tenderness and soreness upon palpation.  ECG is normal.  Reassurance offered.  In the context of chest pain, we did discuss the potential role for ischemic testing though mutually agreed to hold off at this time.  3.  Primary hypertension: Blood pressure stable today.  She notes that it is frequently elevated first thing in the morning but then stabilizes after her morning medicines.  She remains on amlodipine , carvedilol , and losartan .  She has a prescription for prn hydralazine  but has not used in a long time.  4.  Hyperlipidemia: She remains on statin therapy.  LDL was 79 in January.  5.  Hyperkalemia: Potassium was 5.6 in January.  This has not been repeated.  She was not aware of this elevation.  She is on losartan .  Follow-up basic metabolic panel today.  6.  Disposition: She is going to follow-up with her primary care provider related to GERD  symptoms.  She has follow-up scheduled here for June which she will keep if symptoms do not resolve with adjustment to PPI therapy.  Laneta Pintos, NP 04/03/2024, 9:44 AM

## 2024-04-03 ENCOUNTER — Encounter: Payer: Self-pay | Admitting: Nurse Practitioner

## 2024-04-03 ENCOUNTER — Ambulatory Visit: Attending: Nurse Practitioner | Admitting: Nurse Practitioner

## 2024-04-03 VITALS — BP 110/60 | HR 67 | Ht 68.0 in | Wt 158.0 lb

## 2024-04-03 DIAGNOSIS — R1013 Epigastric pain: Secondary | ICD-10-CM | POA: Diagnosis not present

## 2024-04-03 DIAGNOSIS — K219 Gastro-esophageal reflux disease without esophagitis: Secondary | ICD-10-CM

## 2024-04-03 DIAGNOSIS — I10 Essential (primary) hypertension: Secondary | ICD-10-CM | POA: Diagnosis not present

## 2024-04-03 DIAGNOSIS — E782 Mixed hyperlipidemia: Secondary | ICD-10-CM

## 2024-04-03 DIAGNOSIS — R072 Precordial pain: Secondary | ICD-10-CM | POA: Diagnosis not present

## 2024-04-03 DIAGNOSIS — E875 Hyperkalemia: Secondary | ICD-10-CM

## 2024-04-03 DIAGNOSIS — Z79899 Other long term (current) drug therapy: Secondary | ICD-10-CM | POA: Diagnosis not present

## 2024-04-03 MED ORDER — PANTOPRAZOLE SODIUM 40 MG PO TBEC: 40.0000 mg | DELAYED_RELEASE_TABLET | Freq: Two times a day (BID) | ORAL | Status: DC

## 2024-04-03 NOTE — Patient Instructions (Signed)
 Medication Instructions:  Your physician recommends the following medication changes.  INCREASE: Protonix  to 40 mg 1 tablet 2 times per day.    *If you need a refill on your cardiac medications before your next appointment, please call your pharmacy*  Lab Work: Your provider would like for you to have following labs drawn today Basic Metabolic Panel.    If you have labs (blood work) drawn today and your tests are completely normal, you will receive your results only by: MyChart Message (if you have MyChart) OR A paper copy in the mail If you have any lab test that is abnormal or we need to change your treatment, we will call you to review the results.  Follow-Up: Keep your appointment with Dr. Gollan on May 22, 2024.   At [facility], you and your health needs are our priority.  As part of our continuing mission to provide you with exceptional heart care, our providers are all part of one team.  This team includes your primary Cardiologist (physician) and Advanced Practice Providers or APPs (Physician Assistants and Nurse Practitioners) who all work together to provide you with the care you need, when you need it.  We recommend signing up for the patient portal called "MyChart".  Sign up information is provided on this After Visit Summary.  MyChart is used to connect with patients for Virtual Visits (Telemedicine).  Patients are able to view lab/test results, encounter notes, upcoming appointments, etc.  Non-urgent messages can be sent to your provider as well.   To learn more about what you can do with MyChart, go to [email or web address].

## 2024-04-04 LAB — BASIC METABOLIC PANEL WITH GFR
BUN/Creatinine Ratio: 13 (ref 12–28)
BUN: 12 mg/dL (ref 8–27)
CO2: 21 mmol/L (ref 20–29)
Calcium: 9.8 mg/dL (ref 8.7–10.3)
Chloride: 100 mmol/L (ref 96–106)
Creatinine, Ser: 0.95 mg/dL (ref 0.57–1.00)
Glucose: 88 mg/dL (ref 70–99)
Potassium: 5.3 mmol/L — ABNORMAL HIGH (ref 3.5–5.2)
Sodium: 137 mmol/L (ref 134–144)
eGFR: 58 mL/min/{1.73_m2} — ABNORMAL LOW (ref >59)

## 2024-04-06 ENCOUNTER — Other Ambulatory Visit: Payer: Self-pay | Admitting: *Deleted

## 2024-04-06 DIAGNOSIS — E875 Hyperkalemia: Secondary | ICD-10-CM

## 2024-04-08 DIAGNOSIS — E875 Hyperkalemia: Secondary | ICD-10-CM | POA: Diagnosis not present

## 2024-04-09 LAB — BASIC METABOLIC PANEL WITH GFR
BUN/Creatinine Ratio: 14 (ref 12–28)
BUN: 13 mg/dL (ref 8–27)
CO2: 24 mmol/L (ref 20–29)
Calcium: 9.5 mg/dL (ref 8.7–10.3)
Chloride: 99 mmol/L (ref 96–106)
Creatinine, Ser: 0.93 mg/dL (ref 0.57–1.00)
Glucose: 99 mg/dL (ref 70–99)
Potassium: 4.9 mmol/L (ref 3.5–5.2)
Sodium: 137 mmol/L (ref 134–144)
eGFR: 60 mL/min/{1.73_m2} (ref >59)

## 2024-04-16 DIAGNOSIS — G2581 Restless legs syndrome: Secondary | ICD-10-CM | POA: Diagnosis not present

## 2024-04-16 DIAGNOSIS — K219 Gastro-esophageal reflux disease without esophagitis: Secondary | ICD-10-CM | POA: Diagnosis not present

## 2024-04-16 DIAGNOSIS — G629 Polyneuropathy, unspecified: Secondary | ICD-10-CM | POA: Diagnosis not present

## 2024-04-16 DIAGNOSIS — I10 Essential (primary) hypertension: Secondary | ICD-10-CM | POA: Diagnosis not present

## 2024-04-16 DIAGNOSIS — R1013 Epigastric pain: Secondary | ICD-10-CM | POA: Diagnosis not present

## 2024-04-16 DIAGNOSIS — R600 Localized edema: Secondary | ICD-10-CM | POA: Diagnosis not present

## 2024-04-16 DIAGNOSIS — F411 Generalized anxiety disorder: Secondary | ICD-10-CM | POA: Diagnosis not present

## 2024-04-24 ENCOUNTER — Ambulatory Visit: Payer: Self-pay

## 2024-04-24 DIAGNOSIS — J479 Bronchiectasis, uncomplicated: Secondary | ICD-10-CM

## 2024-04-24 NOTE — Telephone Encounter (Signed)
  Copied from CRM [id number]. Topic: Clinical - Red Word Triage >> Apr 24, 2024  4:05 PM Hilton Lucky wrote: Red Word that prompted transfer to Nurse Triage: Mia, patient's daughter, is calling in to state that her nebulizer is not working. Patient is requesting a new nebulizer, as her current is no longer administering the abuterol.  Formerly with Dr. Thelda Finney - Not assigned new physician yet. Reason for Disposition  General information question, no triage required and triager able to answer question  Answer Assessment - Initial Assessment Questions 1. REASON FOR CALL or QUESTION: "What is your reason for calling today?" or "How can I best help you?" or "What question do you have that I can help answer?"     Patient's daughter called to report that patient's nebulizer machine stopped working last night. Daughter states nebulizer machine is over 69 years old. Daughter is instructed to call to pharmacies ([facility], [facility]) to make sure they have a nebulizer machine in stock. Instructed that they will have to pay out of pocket. Daughter verbalized understanding and all questions answered.  Protocols used: Information Only Call - No Triage-A-AH

## 2024-04-28 NOTE — Telephone Encounter (Signed)
 Yes, that's fine to order neb machine

## 2024-04-28 NOTE — Addendum Note (Signed)
 Addended by: Tanay Misuraca M on: 04/28/2024 03:13 PM   Modules accepted: Orders

## 2024-04-28 NOTE — Telephone Encounter (Addendum)
 Order for new neb machine and supplies was placed I called and spoke with the pt and notified her that this was done  Nothing further needed

## 2024-04-28 NOTE — Telephone Encounter (Signed)
 Spoke with the pt  I have scheduled her for rov with Christy Richardson 07/23/24  She is asking if we can go ahead and send order for new neb machine to [facility], could we order under your name since you the provider here that seen her last?

## 2024-04-30 ENCOUNTER — Ambulatory Visit: Payer: Self-pay

## 2024-04-30 ENCOUNTER — Telehealth: Payer: Self-pay

## 2024-04-30 DIAGNOSIS — C44622 Squamous cell carcinoma of skin of right upper limb, including shoulder: Secondary | ICD-10-CM | POA: Diagnosis not present

## 2024-04-30 DIAGNOSIS — L820 Inflamed seborrheic keratosis: Secondary | ICD-10-CM | POA: Diagnosis not present

## 2024-04-30 NOTE — Telephone Encounter (Addendum)
 Copied from CRM [id number]. Topic: Clinical - Order For Equipment >> Apr 30, 2024  3:25 PM Corean Deutscher wrote: Reason for CRM: Patient daughter Charm Coombs called regarding order for nebulizer and supplies for the patient, Mia stated the patient cannot breath without the nebulizer and the patient really needs it, contacted [facility] to speak with Augustus Blood currently unavailable, informed Mia. Mia requesting a callback as she stated this is her second time calling.     Spoke w/ patient daughter verbalized understanding. Order was sent it can take up to a few days

## 2024-04-30 NOTE — Telephone Encounter (Signed)
  Chief Complaint: info only Additional Notes:      Pt daughter Christy Richardson calling because DME has not received new order for neb machine.   Please fax to [phone]  Christy Richardson is reques ting call back from office once this is fax over again. She can be reached at  [phone]    Copied from CRM [id number]. Topic: Clinical - Red Word Triage >> Apr 30, 2024  5:04 PM Jethro Morrison wrote: Red Word that prompted transfer to Nurse Triage: breathing issues out of meds new neb machine ordered daughter Christy Richardson calling wanting it now. Warm transfer to Nurse Triage  DME F: [phone]  Please call Christy Richardson when this is sent to confirm fax. Reason for Disposition  [1] Caller requesting NON-URGENT health information AND [2] PCP's office is the best resource  Answer Assessment - Initial Assessment Questions 1. REASON FOR CALL or QUESTION: "What is your reason for calling today?" or "How can I best help you?" or "What question do you have that I can help answer?"     Pt daughter Christy Richardson calling because DME has not received new order for neb machine. Please fax to [phone]  Protocols used: Information Only Call - No Triage-A-AH

## 2024-04-30 NOTE — Telephone Encounter (Signed)
 Copied from CRM [id number]. Topic: Clinical - Prescription Issue >> Apr 30, 2024 12:39 PM [name] wrote: Reason for CRM: Daughter Rosalene Colon requesting to speak with Gurney Lefort - prescription for nebulizer was never received. Correct fax number is [phone]    Call back number: [phone]   Spoke w/ pt daughter told her I wil look into it, Order has been sent.  Sometimes it can take up to a few days  NFN

## 2024-05-01 NOTE — Telephone Encounter (Signed)
 [facility] confirmed they have received order or nebulizer.

## 2024-05-05 DIAGNOSIS — H903 Sensorineural hearing loss, bilateral: Secondary | ICD-10-CM | POA: Diagnosis not present

## 2024-05-05 DIAGNOSIS — H6983 Other specified disorders of Eustachian tube, bilateral: Secondary | ICD-10-CM | POA: Diagnosis not present

## 2024-05-05 DIAGNOSIS — H7011 Chronic mastoiditis, right ear: Secondary | ICD-10-CM | POA: Diagnosis not present

## 2024-05-07 ENCOUNTER — Other Ambulatory Visit: Payer: Self-pay | Admitting: Cardiovascular Disease

## 2024-05-19 ENCOUNTER — Ambulatory Visit: Admitting: Cardiovascular Disease

## 2024-05-19 NOTE — Telephone Encounter (Signed)
 NFN

## 2024-05-21 NOTE — Progress Notes (Unsigned)
 Cardiology Office Note  Date:  05/22/2024   ID:  Christy Richardson, Christy Richardson 03/06/1935, MRN [id number]  PCP:  Christy Bickers, MD   Chief Complaint  Patient presents with   Follow-up    LS 5/2 no complaints today. Meds reviewed verbally with pt.    HPI:  Christy Richardson is a 89 y.o. female with a history of  labile hypertension, no renal artery stenosis Anxiety mitral valve prolapse per the patient with no mitral valve regurgitation on echocardiogram 2015 , 2020 palpitations, difficulty tolerating event monitor bronchiectasis,  GERD, goirter,  mixed hyperlipidemia,  hypertension She presents today for routine follow-up of her labile blood pressure and tachycardia  Last office visit 6/24 Last visit, 04/03/24, lots of reflux Reports that she takes Protonix  once a day  Sedentary baseline, troubled by neuropathy, has poor balance   Reports blood pressure relatively well-controlled on current medication regimen  If blood pressure runs low in the morning she cuts her morning carvedilol  in half Takes amlodipine  and losartan  in the evening, hydralazine  as needed  Chronic shortness of breath from bronchiectasis, chronic cough  History of UTI, ecoli Followed by ID  Hernia right groin, was told by orthopedics not to do surgery given her age  Prior echocardiogram August 2020, essentially normal study  CT scan chest September 2022 chronic infection 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  Previously using Ativan   or trazodone  for anxiety/sleep  Echocardiogram 2020  no significant MR, no prolapse noted no indication for antibiotics  Past medical history reviewed Echo 07/2019   1. The left ventricle has normal systolic function with an ejection fraction of 60-65%. The cavity size was normal. There is mildly increased left ventricular wall thickness. Left ventricular diastolic Doppler parameters are consistent with impaired  relaxation.  2.  The right ventricle has normal systolic function. The cavity was normal. There is no increase in right ventricular wall thickness. Right ventricular systolic pressure is normal with an estimated pressure of 25.3 mmHg.  echo Doppler study on 02/02/2014.  This showed an ejection fraction of 60-65%.  She had grade 1 diastolic dysfunction.  There was mild aortic sclerosis without stenosis.      lexiscan  perfusion study on 02/04/2014 was normal without scar or ischemia.  Post stress ejection fraction was 73%.      CardioNet monitor to assess for palpitations.  Unfortunately, she developed significant skin irritation, and only wore this for 9 days.  This revealed episodes of sinus rhythm, but she did have episodes of sinus tachycardia with rates in the low 100s.  There was there were no episodes of SVT or atrial fibrillation.  PMH:   has a past medical history of Adenomatous polyp of colon (03/2007), Allergic rhinitis, Anxiety, Arthritis, Blood transfusion (1974), Bronchiectasis (HCC), Cancer (HCC), Deaf, Depression, Diastolic dysfunction, Diet-controlled type 2 diabetes mellitus (HCC), Diverticulosis of colon (without mention of hemorrhage), DOE (dyspnea on exertion), Dyspnea, Fatigue, Fecal incontinence, GERD (gastroesophageal reflux disease), Glaucoma, Goiter, Heart murmur, History of stress test, Hyperkalemia, Hyperlipidemia, Hypertension, Hypothyroidism, IBS (irritable bowel syndrome), Migraine, Mitral valve prolapse, Neck mass, Nonspecific abnormal electrocardiogram (ECG) (EKG), Palpitations, Pneumonia, Polyp, sigmoid colon, Post-operative nausea and vomiting, Pulmonary fibrosis (HCC), Pulmonary nodules, Recurrent UTI, Tachycardia, and Tremor.  PSH:    Past Surgical History:  Procedure Laterality Date   ABDOMINAL HYSTERECTOMY     CARDIOVASCULAR STRESS TEST  09/19/2011   No scintigraphic evidence of inducible myocardial ischemia. Pharmacological stress test without chest pain or EKG changes  for  ischemia.   COLONOSCOPY     ESOPHAGOGASTRODUODENOSCOPY     MASTOIDECTOMY     rt ear/ as a teenager   OOPHORECTOMY     SKIN CANCER EXCISION Left    arm   TOTAL HIP ARTHROPLASTY  5/10   left   TOTAL HIP ARTHROPLASTY Right 07/10/2022   Procedure: TOTAL HIP ARTHROPLASTY ANTERIOR APPROACH;  Surgeon: Christy Angelucci, MD;  Location: [facility] ORS;  Service: Orthopedics;  Laterality: Right;   TRANSTHORACIC ECHOCARDIOGRAM  09/19/2011   EF >55%, stage 1 diastolic dysfunction, mild tricuspid valve regurg    Current Outpatient Medications  Medication Sig Dispense Refill   acetaminophen  (TYLENOL  8 HOUR ARTHRITIS PAIN) 650 MG CR tablet Take 650 mg by mouth every 8 (eight) hours as needed for pain.     albuterol  (PROVENTIL ) (2.5 MG/3ML) 0.083% nebulizer solution Take 3 mLs (2.5 mg total) by nebulization every 6 (six) hours as needed for wheezing or shortness of breath. 360 mL 2   albuterol  (VENTOLIN  HFA) 108 (90 Base) MCG/ACT inhaler Inhale 2 puffs into the lungs every 6 (six) hours as needed for wheezing or shortness of breath. 8 g 6   amLODipine  (NORVASC ) 5 MG tablet Take 1 tablet (5 mg total) by mouth daily. Take an extra tablet (5 mg) daily as needed for systolic blood pressure greater then 170. 180 tablet 3   brimonidine  (ALPHAGAN ) 0.2 % ophthalmic solution Place 1 drop into both eyes 2 (two) times daily. Place 1 drop into both eyes 2 (two) times daily.     carvedilol  (COREG ) 12.5 MG tablet Take 1 tablet (12.5 mg total) by mouth 2 (two) times daily with a meal. 180 tablet 2   cetirizine (ZYRTEC) 10 MG tablet Take 10 mg by mouth every morning.     Cholecalciferol (VITAMIN D3 PO) Take 1 tablet by mouth daily at 6 (six) AM.     cyclobenzaprine  (FLEXERIL ) 10 MG tablet Take 10 mg by mouth 3 (three) times daily as needed for muscle spasms.     dorzolamide  (TRUSOPT ) 2 % ophthalmic solution Place 1 drop into both eyes 2 (two) times daily. Place 1 drop into both eyes 2 (two) times daily.     estradiol  (ESTRACE ) 0.1  MG/GM vaginal cream Apply 0.5 gm vaginally with the applicator of tip of the finger nightly for 2 weeks and then 2-3x weekly. 42.5 g 12   gabapentin (NEURONTIN) 300 MG capsule Take 300 mg by mouth at bedtime.     gentamicin ointment (GARAMYCIN) 0.1 % as needed (nose irritation).     guaifenesin  (MUCUS RELIEF) 400 MG TABS tablet Take 1,200 mg by mouth every morning.     hydrALAZINE  (APRESOLINE ) 50 MG tablet Take 0.5 tablets (25 mg total) by mouth daily as needed. 15 tablet 0   levothyroxine  (SYNTHROID ) 75 MCG tablet Take 75 mcg by mouth daily before breakfast.     LORazepam  (ATIVAN ) 0.5 MG tablet Take 0.5 mg by mouth 2 (two) times daily as needed for anxiety (tremors).     losartan  (COZAAR ) 100 MG tablet TAKE ONE TABLET BY MOUTH ONCE DAILY. 90 tablet 0   Multiple Vitamins-Minerals (MULTIVITAMIN WITH MINERALS) tablet Take 1 tablet by mouth 3 (three) times a week.     pantoprazole  (PROTONIX ) 40 MG tablet Take 1 tablet (40 mg total) by mouth 2 (two) times daily.     Polyethyl Glycol-Propyl Glycol (SYSTANE OP) Apply 1 drop to eye as needed.     pravastatin  (PRAVACHOL ) 40 MG tablet Take 1 tablet (  40 mg total) by mouth at bedtime.     Probiotic Product (PROBIOTIC PO) Take 1 tablet by mouth daily at 6 (six) AM.     rOPINIRole (REQUIP) 0.5 MG tablet Take 0.5 mg by mouth at bedtime.     sertraline  (ZOLOFT ) 100 MG tablet Take 2 tablets by mouth every morning.     sodium chloride  (OCEAN) 0.65 % nasal spray Place 2 sprays into both nostrils daily at 6 (six) AM. Use as needed     sodium chloride  HYPERTONIC 3 % nebulizer solution Take as 3 mL neb as needed for cough or chest congestion 750 mL 12   Tiotropium Bromide-Olodaterol (STIOLTO RESPIMAT ) 2.5-2.5 MCG/ACT AERS Inhale 2 puffs into the lungs daily.     traMADol  (ULTRAM ) 50 MG tablet Take 1 tablet (50 mg total) by mouth every 6 (six) hours as needed. 30 tablet 0   traZODone  (DESYREL ) 50 MG tablet Take 50 mg by mouth at bedtime.     triamcinolone  (NASACORT )  55 MCG/ACT AERO nasal inhaler Place 1 spray into the nose at bedtime.     Tiotropium Bromide-Olodaterol (STIOLTO RESPIMAT ) 2.5-2.5 MCG/ACT AERS Inhale 2 puffs into the lungs daily. (Patient not taking: Reported on 05/22/2024) 4 g 5   No current facility-administered medications for this visit.    Allergies:   Prednisone, Tramadol , Adhesive [tape], Penicillins, and Sulfonamide derivatives   Social History:  The patient  reports that she has never smoked. She has been exposed to tobacco smoke. She has never used smokeless tobacco. She reports current alcohol  use. She reports that she does not use drugs.   Family History:   family history includes Asthma in her daughter; Colon cancer in her paternal grandfather; Coronary artery disease in her mother; Diabetes in her father; Prostate cancer in her brother; Scoliosis in her daughter; Throat cancer in her mother.    Review of Systems: Review of Systems  Constitutional: Negative.   HENT: Negative.    Respiratory: Negative.    Cardiovascular: Negative.   Gastrointestinal: Negative.   Musculoskeletal: Negative.   Neurological: Negative.   Psychiatric/Behavioral: Negative.    All other systems reviewed and are negative.   PHYSICAL EXAM: VS:  BP 134/70 (BP Location: Left Arm, Patient Position: Sitting, Cuff Size: Normal)   Pulse 97   Ht 5' 8 (1.727 m)   Wt 159 lb 2 oz (72.2 kg)   SpO2 95%   BMI 24.19 kg/m  , BMI Body mass index is 24.19 kg/m. Constitutional:  oriented to person, place, and time. No distress.  HENT:  Head: Grossly normal Eyes:  no discharge. No scleral icterus.  Neck: No JVD, no carotid bruits  Cardiovascular: Regular rate and rhythm, no murmurs appreciated Pulmonary/Chest: Clear to auscultation bilaterally, no wheezes or rales Abdominal: Soft.  no distension.  no tenderness.  Musculoskeletal: Normal range of motion Neurological:  normal muscle tone. Coordination normal. No atrophy Skin: Skin warm and  dry Psychiatric: normal affect, pleasant   Recent Labs: 12/26/2023: Hemoglobin 14.9; Platelets 241 04/08/2024: BUN 13; Creatinine, Ser 0.93; Potassium 4.9; Sodium 137    Lipid Panel Lab Results  Component Value Date   CHOL 201 (H) 03/01/2009   HDL 47 03/01/2009   LDLCALC 116 (H) 03/01/2009   TRIG 189 (H) 03/01/2009      Wt Readings from Last 3 Encounters:  05/22/24 159 lb 2 oz (72.2 kg)  04/03/24 158 lb (71.7 kg)  12/26/23 154 lb (69.9 kg)     ASSESSMENT AND PLAN:  Hyperlipidemia LDL  goal <70 - Plan: EKG 12-Lead Tolerating pravastatin   Essential hypertension - Plan: EKG 12-Lead Blood pressure is well controlled on today's visit. No changes made to the medications.  Bronchiectasis Stable, chronic sputum Followed by pulmonary Symptoms stable today  Anxiety Lorazepam  and trazodone  provided by primary care  OSA, Does not want workup ,  Sleep disorder exacerbated by nocturia, up 4-5 times a night  Trace ankle swelling Could be from amlodipine  Minimal symptoms, recommended leg elevation, compression hose For worsening symptoms may need to change amlodipine    No orders of the defined types were placed in this encounter.    Signed, Juanda Noon, M.D., Ph.D. 05/22/2024  [facility] [place], [place] [phone]

## 2024-05-22 ENCOUNTER — Encounter: Payer: Self-pay | Admitting: Cardiovascular Disease

## 2024-05-22 ENCOUNTER — Ambulatory Visit: Attending: Cardiovascular Disease | Admitting: Cardiovascular Disease

## 2024-05-22 VITALS — BP 134/70 | HR 97 | Ht 68.0 in | Wt 159.1 lb

## 2024-05-22 DIAGNOSIS — K219 Gastro-esophageal reflux disease without esophagitis: Secondary | ICD-10-CM | POA: Diagnosis not present

## 2024-05-22 DIAGNOSIS — I479 Paroxysmal tachycardia, unspecified: Secondary | ICD-10-CM | POA: Diagnosis not present

## 2024-05-22 DIAGNOSIS — E782 Mixed hyperlipidemia: Secondary | ICD-10-CM | POA: Diagnosis not present

## 2024-05-22 DIAGNOSIS — G479 Sleep disorder, unspecified: Secondary | ICD-10-CM | POA: Diagnosis not present

## 2024-05-22 DIAGNOSIS — R072 Precordial pain: Secondary | ICD-10-CM | POA: Diagnosis not present

## 2024-05-22 DIAGNOSIS — I10 Essential (primary) hypertension: Secondary | ICD-10-CM

## 2024-05-22 DIAGNOSIS — J479 Bronchiectasis, uncomplicated: Secondary | ICD-10-CM | POA: Diagnosis not present

## 2024-05-22 MED ORDER — PANTOPRAZOLE SODIUM 40 MG PO TBEC: 40.0000 mg | DELAYED_RELEASE_TABLET | Freq: Two times a day (BID) | ORAL | 1 refills | Status: DC

## 2024-05-22 NOTE — Patient Instructions (Signed)

## 2024-05-26 ENCOUNTER — Telehealth: Payer: Self-pay | Admitting: Cardiovascular Disease

## 2024-05-26 MED ORDER — PANTOPRAZOLE SODIUM 40 MG PO TBEC
40.0000 mg | DELAYED_RELEASE_TABLET | Freq: Two times a day (BID) | ORAL | 3 refills | Status: AC
Start: 2024-05-26 — End: ?

## 2024-05-26 NOTE — Telephone Encounter (Signed)
 Pt c/o medication issue:  1. Name of Medication: pantoprazole  (PROTONIX ) 40 MG tablet   2. How are you currently taking this medication (dosage and times per day)?   Take 1 tablet (40 mg total) by mouth 2 (two) times daily.    3. Are you having a reaction (difficulty breathing--STAT)? no  4. What is your medication issue? Patient states Dr. Gollan ordered for her this medication, however her pharmacy didn't receive the script.  Please send to [facility] - [place], [place] - [id number]

## 2024-05-26 NOTE — Telephone Encounter (Signed)
 Prescription sent to preferred pharmacy

## 2024-05-28 DIAGNOSIS — Z08 Encounter for follow-up examination after completed treatment for malignant neoplasm: Secondary | ICD-10-CM | POA: Diagnosis not present

## 2024-05-28 DIAGNOSIS — Z85828 Personal history of other malignant neoplasm of skin: Secondary | ICD-10-CM | POA: Diagnosis not present

## 2024-06-03 DIAGNOSIS — H401131 Primary open-angle glaucoma, bilateral, mild stage: Secondary | ICD-10-CM | POA: Diagnosis not present

## 2024-06-03 DIAGNOSIS — M3501 Sicca syndrome with keratoconjunctivitis: Secondary | ICD-10-CM | POA: Diagnosis not present

## 2024-06-03 DIAGNOSIS — H35371 Puckering of macula, right eye: Secondary | ICD-10-CM | POA: Diagnosis not present

## 2024-06-03 DIAGNOSIS — H43813 Vitreous degeneration, bilateral: Secondary | ICD-10-CM | POA: Diagnosis not present

## 2024-06-08 ENCOUNTER — Telehealth: Payer: Self-pay | Admitting: Cardiovascular Disease

## 2024-06-08 MED ORDER — AMLODIPINE BESYLATE 5 MG PO TABS: 5.0000 mg | ORAL_TABLET | Freq: Every day | ORAL | 3 refills | Status: AC

## 2024-06-08 NOTE — Telephone Encounter (Signed)
*  STAT* If patient is at the pharmacy, call can be transferred to refill team.   1. Which medications need to be refilled? (please list name of each medication and dose if known) amLODipine  (NORVASC ) 5 MG tablet    2. Would you like to learn more about the convenience, safety, & potential cost savings by using the [facility]?     3. Are you open to using the [facility] (Type [facility].  ).   4. Which pharmacy/location (including street and city if local pharmacy) is medication to be sent to?  [facility] - [place], [place] - [id number]     5. Do they need a 30 day or 90 day supply? 90 day

## 2024-06-08 NOTE — Telephone Encounter (Signed)
 RX sent to requested Pharmacy

## 2024-06-30 DIAGNOSIS — F411 Generalized anxiety disorder: Secondary | ICD-10-CM | POA: Diagnosis not present

## 2024-06-30 DIAGNOSIS — R600 Localized edema: Secondary | ICD-10-CM | POA: Diagnosis not present

## 2024-06-30 DIAGNOSIS — G2581 Restless legs syndrome: Secondary | ICD-10-CM | POA: Diagnosis not present

## 2024-06-30 DIAGNOSIS — K219 Gastro-esophageal reflux disease without esophagitis: Secondary | ICD-10-CM | POA: Diagnosis not present

## 2024-06-30 DIAGNOSIS — R1013 Epigastric pain: Secondary | ICD-10-CM | POA: Diagnosis not present

## 2024-06-30 DIAGNOSIS — G629 Polyneuropathy, unspecified: Secondary | ICD-10-CM | POA: Diagnosis not present

## 2024-06-30 DIAGNOSIS — I872 Venous insufficiency (chronic) (peripheral): Secondary | ICD-10-CM | POA: Diagnosis not present

## 2024-06-30 DIAGNOSIS — I10 Essential (primary) hypertension: Secondary | ICD-10-CM | POA: Diagnosis not present

## 2024-07-13 DIAGNOSIS — G629 Polyneuropathy, unspecified: Secondary | ICD-10-CM | POA: Diagnosis not present

## 2024-07-13 DIAGNOSIS — N390 Urinary tract infection, site not specified: Secondary | ICD-10-CM | POA: Diagnosis not present

## 2024-07-16 ENCOUNTER — Other Ambulatory Visit: Payer: Self-pay

## 2024-07-16 MED ORDER — ALBUTEROL SULFATE (2.5 MG/3ML) 0.083% IN NEBU: 2.5000 mg | INHALATION_SOLUTION | Freq: Four times a day (QID) | RESPIRATORY_TRACT | 2 refills | Status: DC | PRN

## 2024-07-20 DIAGNOSIS — N390 Urinary tract infection, site not specified: Secondary | ICD-10-CM | POA: Diagnosis not present

## 2024-07-22 ENCOUNTER — Telehealth: Payer: Self-pay

## 2024-07-22 ENCOUNTER — Ambulatory Visit: Admitting: Nurse Practitioner

## 2024-07-22 ENCOUNTER — Encounter: Payer: Self-pay | Admitting: Nurse Practitioner

## 2024-07-22 VITALS — BP 138/70 | HR 68 | Temp 97.5°F | Ht 68.0 in | Wt 157.8 lb

## 2024-07-22 DIAGNOSIS — K219 Gastro-esophageal reflux disease without esophagitis: Secondary | ICD-10-CM

## 2024-07-22 DIAGNOSIS — J310 Chronic rhinitis: Secondary | ICD-10-CM | POA: Diagnosis not present

## 2024-07-22 DIAGNOSIS — J449 Chronic obstructive pulmonary disease, unspecified: Secondary | ICD-10-CM | POA: Diagnosis not present

## 2024-07-22 DIAGNOSIS — J479 Bronchiectasis, uncomplicated: Secondary | ICD-10-CM

## 2024-07-22 MED ORDER — FAMOTIDINE 20 MG PO TABS: 20.0000 mg | ORAL_TABLET | Freq: Two times a day (BID) | ORAL | 1 refills | Status: DC

## 2024-07-22 MED ORDER — OHTUVAYRE 3 MG/2.5ML IN SUSP: 2.5000 mL | Freq: Two times a day (BID) | RESPIRATORY_TRACT | Status: AC

## 2024-07-22 MED ORDER — IPRATROPIUM BROMIDE 0.06 % NA SOLN: 2.0000 | Freq: Three times a day (TID) | NASAL | 12 refills | Status: AC

## 2024-07-22 NOTE — Telephone Encounter (Signed)
 Patient was seen in the office today. Izetta Rouleau, NP has ordered Ohtuvayre  for the patient.  She has signed the form and it has been faxed to the pharmacy team for completion.

## 2024-07-22 NOTE — Progress Notes (Addendum)
 @Patient  ID: Christy Richardson, female    DOB: 1935/02/03, 89 y.o.   MRN: [id number]  Chief Complaint  Patient presents with   Medical Management of Chronic Issues    SOB. Voice changes. Cough with yellow sputum.    Referring provider: Shona Norleen PEDLAR, MD  HPI: 89 year old female, never smoker followed for bronchiectasis, mild obstructive lung disease and lung nodules.  She is a former patient Dr. Harriet and last seen in office 08/07/2023.  Past medical history significant for migraines, hypertension, CHF, GERD, IBS, hypothyroidism, essential tremor, OA, HLD, anxiety, depression, glaucoma.  TEST/EVENTS:  08/31/2021 CT chest without contrast: Atherosclerosis. Bronchiectatic changes within the middle lobe and lower lobe on the right and lingular and lower lobe on the left.  4 mm left upper lobe pulmonary nodule, stable compared to previous.  Scattered areas of nodularity in the left lung base without change.  Biapical scarring similar to previous.  RUL nodule unchanged, measuring 5 mm.  Nodule RLL 6 mm and also unchanged.  Both stable over 2-year interval. 01/22/2023 PFT: FVC 98, FEV1 83, ratio 99, TLC 98, DLCO 58  08/07/2023: OV with Dr. Brenna. Not been using vest therapy regularly does help at times but heavy and difficult to use. Managed okay on Stiolto and albuterol . Not had any recent exacerbations. Daily cough, stable. Nodules documented 2 years stability; no f/u needed due to current age and stability.   07/22/2024: Today - follow up Discussed the use of AI scribe software for clinical note transcription with the patient, who gave verbal consent to proceed.  History of Present Illness Christy Richardson is an 89 year old female with bronchiectasis and mild COPD who presents for follow up. She is accompanied by her daughter.  She has ongoing shortness of breath, which she doesn't feel is much different than the last time she was seen. Her breathing has been consistently poor, but there has not been a  drastic change recently. She uses a nebulizer with albuterol  twice a day and Stiolto inhaler once daily. She is not using her saline nebs. She experiences a cough with yellow phlegm that comes and goes, and hears a 'little rattle' in her throat when coughing. This has been baseline for her for quite some time.   She reports voice hoarseness persisting for several months, affecting her communication as people often ask her to repeat herself. She uses saline nasal spray regularly and has been on Protonix  twice a day for reflux, which was increased a couple of months ago by another doctor. Despite this, she has not noticed any improvement in her voice hoarseness or cough. She also uses Nasacort  every night. Still has some postnasal drainage and sinus congestion.   She mentions having severe reflux and acknowledges that it could be contributing to her symptoms. No fever, chills, or hemoptysis. No difficulties swallowing, changes in bowel habits, abd pain, N/V. She currently has a urinary tract infection, which she describes as putting her in 'pretty bad shape' but feels like she's recovering with abx.   She has not been using vest therapy device, as she finds it heavy and difficult to use. She does have a flutter valve but hasn't been using it due to discomfort from a hernia. She has not been doing her saline nebs.      Allergies  Allergen Reactions   Prednisone     REACTION: Increase eye pressure with gloucoma. Broke out in rash after injection per Dr. Beverley   Tramadol  Itching  Adhesive [Tape]     Causes blisters/irritates skin-surgical tape   Penicillins     REACTION: Rash   Sulfonamide Derivatives     REACTION: Rash - not sure    Immunization History  Administered Date(s) Administered   Fluad Quad(high Dose 65+) 09/19/2020   Influenza Split 09/05/2013, 08/18/2014, 08/31/2015   Influenza Whole 09/23/2006, 10/03/2007, 08/25/2008, 08/03/2010, 09/02/2012   Influenza, High Dose Seasonal PF  09/20/2017, 09/19/2018, 09/01/2019   Influenza-Unspecified 08/03/2014, 09/02/2018   Moderna Sars-Covid-2 Vaccination 01/28/2020, 02/26/2020   Pneumococcal Conjugate-13 08/18/2014   Pneumococcal Polysaccharide-23 05/04/2003    Past Medical History:  Diagnosis Date   Adenomatous polyp of colon 03/2007   Allergic rhinitis    Anxiety    a.) on BZO (lorazepam ) PRN   Arthritis    Blood transfusion 1974   Bronchiectasis (HCC)    Cancer (HCC)    left arm   Deaf    in right 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 Mild TR; b.) TTE 07/16/2019: EF 60-65%, triv AR, mild MR/TR, G1DD.   Diet-controlled type 2 diabetes mellitus (HCC)    Diverticulosis of colon (without mention of hemorrhage)    DOE (dyspnea on exertion)    Dyspnea    Fatigue    Fecal incontinence    GERD (gastroesophageal reflux disease)    Glaucoma    Goiter    right side   Heart murmur    History of stress test    a. 01/2014 Lexi MV: no ischemia/infarct.   Hyperkalemia    Hyperlipidemia    Hypertension    a. 11/2018 Renal artery duplex: No RAS.    Hypothyroidism    IBS (irritable bowel syndrome)    Migraine    Mitral valve prolapse    a. 01/2014 Echo: no MR/MVP noted.   Neck mass    right side/ no airway problems/ per pt has had since 2005 (goiter)   Nonspecific abnormal electrocardiogram (ECG) (EKG)    Palpitations    a. 01/2014 CardioNet: wore for 9 days (rash). Sinus tach - 100's. No SVT/AF.   Pneumonia    Polyp, sigmoid colon    Post-operative nausea and vomiting    Pulmonary fibrosis (HCC)    Pulmonary nodules    Recurrent UTI    Tachycardia    Tremor     Tobacco History: Social History   Tobacco Use  Smoking Status Never   Passive exposure: Past  Smokeless Tobacco Never   Counseling given: Not Answered   Outpatient Medications Prior to Visit  Medication Sig Dispense Refill   acetaminophen   (TYLENOL  8 HOUR ARTHRITIS PAIN) 650 MG CR tablet Take 650 mg by mouth every 8 (eight) hours as needed for pain.     albuterol  (PROVENTIL ) (2.5 MG/3ML) 0.083% nebulizer solution Take 3 mLs (2.5 mg total) by nebulization every 6 (six) hours as needed for wheezing or shortness of breath. 360 mL 2   amLODipine  (NORVASC ) 5 MG tablet Take 1 tablet (5 mg total) by mouth daily. Take an extra tablet (5 mg) daily as needed for systolic blood pressure greater then 170. 180 tablet 3   brimonidine  (ALPHAGAN ) 0.2 % ophthalmic solution Place 1 drop into both eyes 2 (two) times daily. Place 1 drop into both eyes 2 (two) times daily.     carvedilol  (COREG ) 12.5 MG tablet Take 1 tablet (12.5 mg total) by mouth 2 (  two) times daily with a meal. 180 tablet 2   cetirizine (ZYRTEC) 10 MG tablet Take 10 mg by mouth every morning.     cyclobenzaprine  (FLEXERIL ) 10 MG tablet Take 10 mg by mouth 3 (three) times daily as needed for muscle spasms.     dorzolamide  (TRUSOPT ) 2 % ophthalmic solution Place 1 drop into both eyes 2 (two) times daily. Place 1 drop into both eyes 2 (two) times daily.     estradiol  (ESTRACE ) 0.1 MG/GM vaginal cream Apply 0.5 gm vaginally with the applicator of tip of the finger nightly for 2 weeks and then 2-3x weekly. 42.5 g 12   gabapentin (NEURONTIN) 300 MG capsule Take 300 mg by mouth at bedtime.     gentamicin ointment (GARAMYCIN) 0.1 % as needed (nose irritation).     guaifenesin  (MUCUS RELIEF) 400 MG TABS tablet Take 1,200 mg by mouth every morning.     hydrALAZINE  (APRESOLINE ) 50 MG tablet Take 0.5 tablets (25 mg total) by mouth daily as needed. 15 tablet 0   levothyroxine  (SYNTHROID ) 75 MCG tablet Take 75 mcg by mouth daily before breakfast.     LORazepam  (ATIVAN ) 0.5 MG tablet Take 0.5 mg by mouth 2 (two) times daily as needed for anxiety (tremors).     losartan  (COZAAR ) 100 MG tablet TAKE ONE TABLET BY MOUTH ONCE DAILY. 90 tablet 0   Multiple Vitamins-Minerals (MULTIVITAMIN WITH MINERALS)  tablet Take 1 tablet by mouth 3 (three) times a week.     nitrofurantoin , macrocrystal-monohydrate, (MACROBID ) 100 MG capsule Take 100 mg by mouth every 12 (twelve) hours.     pantoprazole  (PROTONIX ) 40 MG tablet Take 1 tablet (40 mg total) by mouth 2 (two) times daily. 180 tablet 3   Polyethyl Glycol-Propyl Glycol (SYSTANE OP) Apply 1 drop to eye as needed.     pravastatin  (PRAVACHOL ) 40 MG tablet Take 1 tablet (40 mg total) by mouth at bedtime.     Probiotic Product (PROBIOTIC PO) Take 1 tablet by mouth daily at 6 (six) AM.     rOPINIRole (REQUIP) 0.5 MG tablet Take 0.5 mg by mouth at bedtime.     sertraline  (ZOLOFT ) 100 MG tablet Take 2 tablets by mouth every morning.     sodium chloride  (OCEAN) 0.65 % nasal spray Place 2 sprays into both nostrils daily at 6 (six) AM. Use as needed     sodium chloride  HYPERTONIC 3 % nebulizer solution Take as 3 mL neb as needed for cough or chest congestion (Patient taking differently: as needed. Take as 3 mL neb as needed for cough or chest congestion) 750 mL 12   Tiotropium Bromide-Olodaterol (STIOLTO RESPIMAT ) 2.5-2.5 MCG/ACT AERS Inhale 2 puffs into the lungs daily.     traMADol  (ULTRAM ) 50 MG tablet Take 1 tablet (50 mg total) by mouth every 6 (six) hours as needed. 30 tablet 0   traZODone  (DESYREL ) 50 MG tablet Take 50 mg by mouth at bedtime.     triamcinolone  (NASACORT ) 55 MCG/ACT AERO nasal inhaler Place 1 spray into the nose at bedtime.     albuterol  (VENTOLIN  HFA) 108 (90 Base) MCG/ACT inhaler Inhale 2 puffs into the lungs every 6 (six) hours as needed for wheezing or shortness of breath. (Patient not taking: Reported on 07/22/2024) 8 g 6   Cholecalciferol (VITAMIN D3 PO) Take 1 tablet by mouth daily at 6 (six) AM. (Patient not taking: Reported on 07/22/2024)     Tiotropium Bromide-Olodaterol (STIOLTO RESPIMAT ) 2.5-2.5 MCG/ACT AERS Inhale 2 puffs into the lungs  daily. (Patient not taking: Reported on 07/22/2024) 4 g 5   No facility-administered  medications prior to visit.     Review of Systems:   Constitutional: No weight loss or gain, night sweats, fevers, chills, or lassitude. +fatigue (baseline) HEENT: No headaches, difficulty swallowing, tooth/dental problems, or sore throat. No sneezing, itching, ear ache +nasal congestion, post nasal drip, voice hoarseness  CV:  No chest pain, orthopnea, PND, swelling in lower extremities, anasarca, dizziness, palpitations, syncope Resp: +shortness of breath with exertion (baseline); chronic cough (baseline). No excess mucus or change in color of mucus. No hemoptysis. No wheezing.  No chest wall deformity GI:  + heartburn, indigestion. No abdominal pain, nausea, vomiting, diarrhea, change in bowel habits, loss of appetite, bloody stools.  Neuro: No dizziness or lightheadedness.  Psych: No depression or anxiety. Mood stable.     Physical Exam:  BP 138/70   Pulse 68   Temp (!) 97.5 F (36.4 C)   Ht 5' 8 (1.727 m)   Wt 157 lb 12.8 oz (71.6 kg)   SpO2 95%   BMI 23.99 kg/m   GEN: Pleasant, interactive, well-appearing; elderly; in no acute distress. HEENT:  Normocephalic and atraumatic. PERRLA. Sclera white. Nasal turbinates pale, moist and patent bilaterally. No rhinorrhea present. Oropharynx pink and moist, without exudate or edema. No lesions, ulcerations, or postnasal drip.  NECK:  Supple w/ fair ROM. No JVD present. Normal carotid impulses w/o bruits. Thyroid  symmetrical with no goiter or nodules palpated. No lymphadenopathy.   CV: RRR, no m/r/g, no peripheral edema. Pulses intact, +2 bilaterally. No cyanosis, pallor or clubbing. PULMONARY:  Unlabored, regular breathing. Diminished bibasilar airflow otherwise clear bilaterally A&P w/o wheezes/rales/rhonchi. No accessory muscle use. No dullness to percussion. GI: BS present and normoactive. Soft, non-tender to palpation.  MSK: No erythema, warmth or tenderness. Cap refil <2 sec all extrem.  Neuro: A/Ox3. No focal deficits noted.    Skin: Warm, no lesions or rashe Psych: Normal affect and behavior. Judgement and thought content appropriate.     Lab Results:  CBC    Component Value Date/Time   WBC 7.7 12/26/2023 2128   RBC 4.99 12/26/2023 2128   HGB 14.9 12/26/2023 2128   HGB CANCELED 11/04/2018 1114   HCT 45.5 12/26/2023 2128   HCT CANCELED 11/04/2018 1114   PLT 241 12/26/2023 2128   PLT CANCELED 11/04/2018 1114   MCV 91.2 12/26/2023 2128   MCH 29.9 12/26/2023 2128   MCHC 32.7 12/26/2023 2128   RDW 12.7 12/26/2023 2128   LYMPHSABS 1.1 11/06/2018 0754   LYMPHSABS CANCELED 11/04/2018 1114   MONOABS 0.3 11/06/2018 0754   EOSABS 0.2 11/06/2018 0754   EOSABS CANCELED 11/04/2018 1114   BASOSABS 0.0 11/06/2018 0754   BASOSABS CANCELED 11/04/2018 1114    BMET    Component Value Date/Time   NA 137 04/08/2024 1503   K 4.9 04/08/2024 1503   CL 99 04/08/2024 1503   CO2 24 04/08/2024 1503   GLUCOSE 99 04/08/2024 1503   GLUCOSE 99 12/26/2023 2128   BUN 13 04/08/2024 1503   CREATININE 0.93 04/08/2024 1503   CREATININE 1.01 04/12/2014 1419   CALCIUM 9.5 04/08/2024 1503   GFRNONAA >60 12/26/2023 2128   GFRAA >60 11/06/2018 0754    BNP No results found for: BNP   Imaging:  No results found.  Administration History     None          Latest Ref Rng & Units 01/22/2023    4:15 PM 02/25/2018  10:55 AM  PFT Results  FVC-Pre L  3.15   FVC-Predicted Pre % 98  106   Pre FEV1/FVC % % 64  61   FEV1-Pre L 1.72  1.92   FEV1-Predicted Pre % 83  86   DLCO uncorrected ml/min/mmHg 11.88  14.54   DLCO UNC% % 58  51   DLCO corrected ml/min/mmHg 11.88    DLCO COR %Predicted % 58    DLVA Predicted % 64  58   TLC L 5.43  5.53   TLC % Predicted % 98  100   RV % Predicted % 97  86     No results found for: NITRICOXIDE      Assessment & Plan:   Assessment & Plan Bronchiectasis with mild COPD and chronic cough with sputum production Chronic cough with yellow sputum and mild COPD related to  bronchiectasis. High symptom burden which is likely multifactorial. Will trial her on Ohtuvayre  and reassess response. Avoid ICS in setting of btx. Continue LABA/LAMA therapy and PRN albuterol . Encouraged to restart mucociliary clearance therapies. Does not appear to be in acute exacerbation and lung exam clear. Action plan in place.  - Prescribe Ohtuvayre  nebulizer treatments twice daily to open airways and decrease lung inflammation. Discussed side effect profile - Continue Stiolto as prescribed. - Use albuterol  nebulizer as needed for phlegm clearance. - Use Mucinex  twice daily. - Use saline nebulizers and vest therapy for phlegm clearance. - Consider SmartVest if unable to tolerate current vest  - Adjust flutter valve pressure to reduce hernia discomfort and utilize pillow for splinting  - Monitor for signs of infection such as increased phlegm, bloody sputum, fever, or decreased appetite.  Voice hoarseness likely related to gastroesophageal reflux disease Voice hoarseness present for several months, likely related to postnasal drainage or reflux. Prior laryngoscopy unremarkable, per their report. Current treatment includes Protonix  twice daily without improvement in hoarseness. - Refer to gastroenterology for further management of reflux. - Consider repeat ENT referral if hoarseness persists despite reflux management. - Add on ipratropium nasal spray tid - Add on famotidine  for breakthrough reflux   Gastroesophageal reflux disease (GERD) GERD with significant symptoms, including voice hoarseness and potential contribution to bronchiectasis. Currently on Protonix  twice daily, increased dosage by another provider two months ago without improvement in reflux symptoms.  - Refer to gastroenterology for further evaluation and management of GERD. - See above plan    I spent 45 minutes of dedicated to the care of this patient on the date of this encounter to include pre-visit review of records,  face-to-face time with the patient discussing conditions above, post visit ordering of testing, clinical documentation with the electronic health record, making appropriate referrals as documented, and communicating necessary findings to members of the patients care team.  Comer LULLA Rouleau, NP 07/22/2024  Pt aware and understands NP's role.

## 2024-07-22 NOTE — Patient Instructions (Addendum)
 Continue Albuterol  inhaler 2 puffs or 3 mL neb every 6 hours as needed for shortness of breath or wheezing. Notify if symptoms persist despite rescue inhaler/neb use.  Continue zyrtec 1 tab daily Continue pantoprazole  1 tab Twice daily  Continue stiolto 2 puffs daily Continue nasocort nasal spray   -Use hypertonic saline nebulizer treatments Twice daily for cough/congestion and follow with flutter valve 10 times  -Guaifenesin  600 mg Twice daily for cough/congestion  -Ipratropium nasal spray 2 sprays Three times a day as needed for nasal congestion/drainage  -Start Ohtuvayre  nebs Twice daily. This will be a new maintenance medication that you will use twice daily, regardless of symptoms  -Famotidine  20 mg Twice daily as needed for breakthrough reflux  Referral to GI   Follow up with Dr. Bary (new pt 30 min slot) in [place] in 6-8 weeks. If symptoms do not improve or worsen, please contact office for sooner follow up or seek emergency care.

## 2024-07-23 ENCOUNTER — Ambulatory Visit: Admitting: Nurse Practitioner

## 2024-07-23 ENCOUNTER — Encounter: Payer: Self-pay | Admitting: Nurse Practitioner

## 2024-07-23 ENCOUNTER — Telehealth: Payer: Self-pay

## 2024-07-23 NOTE — Addendum Note (Signed)
 Addended by: Ronaldo Crilly V on: 07/23/2024 04:17 PM   Modules accepted: Level of Service

## 2024-07-23 NOTE — Telephone Encounter (Signed)
 Pharmacy team has received application and will begin BIV in separate encounter

## 2024-07-23 NOTE — Telephone Encounter (Signed)
 Received Ohtuvayre  new start paperwork. Completed form and faxed with clinicals and insurance card copy to [facility]   Phone#: [phone] Fax#: [phone]

## 2024-07-24 NOTE — Telephone Encounter (Signed)
 Received fax from [employer] confirming receipt of Ohtuvayre  enrollment form  Patient ID: [id number]

## 2024-07-26 ENCOUNTER — Other Ambulatory Visit: Payer: Self-pay | Admitting: Cardiovascular Disease

## 2024-07-28 NOTE — Telephone Encounter (Signed)
 Received fax from [employer] with summary of benefits. Referral form for Ohtuvayre  received. Rx will be triaged to [facility].. Once benefits investigation completed, pharmacy will reach out the patient to schedule shipment. If medication is unaffordable, patient will need to express financial hardship to be referred back to [facility] for patient assistance program pre-screening.   Patient ID: [id number] Pharmacy phone: [phone] [facility] Phone#: [phone]

## 2024-07-29 ENCOUNTER — Telehealth: Payer: Self-pay | Admitting: Pulmonary Disease

## 2024-07-29 ENCOUNTER — Other Ambulatory Visit: Payer: Self-pay

## 2024-07-29 ENCOUNTER — Telehealth: Payer: Self-pay

## 2024-07-29 MED ORDER — ALBUTEROL SULFATE (2.5 MG/3ML) 0.083% IN NEBU
2.5000 mg | INHALATION_SOLUTION | Freq: Four times a day (QID) | RESPIRATORY_TRACT | 12 refills | Status: AC | PRN
Start: 2024-07-29 — End: ?

## 2024-07-29 NOTE — Telephone Encounter (Signed)
 Copied from CRM [id number]. Topic: Clinical - Prescription Issue >> Jul 29, 2024  9:10 AM [name] wrote: Reason for CRM: Please fax HIPAA release to [name] as patients daughter is trying to get patients nebulizer solution ordered but needs to set up patient assistance.   Please fax to : [phone]

## 2024-07-29 NOTE — Telephone Encounter (Signed)
 Copied from CRM #[id number]. Topic: Clinical - Prescription Issue >> Jul 29, 2024 11:03 AM [name] wrote: Reason for CRM: Patient's child [name] [phone] [facility] pharmacy still does have albuterol  (PROVENTIL ) (2.5 MG/3ML) 0.083% nebulizer solution, that the office sent 07/16/24. Kim states to take albuterol  (VENTOLIN  HFA) 108 (90 Base) MCG/ACT inhaler out of patient's chart, she tried showing the patient to use it and the patient does not use it. Kim states patient has a few vials left of medication, please resend the medication to [facility] [employer], [place] - [id number] [place] [address] Phone: [phone] Fax: [phone] Per CAL Barron, send message to [place]. [name] is trying to set up a different pharmacy for patient. Please advise and call back.    Spoke w/ kim okay per DPR  Refilled placed since Pham states they never received order

## 2024-07-29 NOTE — Telephone Encounter (Signed)
 Routing to pharm team since this is regarding a specialty med

## 2024-07-30 NOTE — Telephone Encounter (Deleted)
 Called [facility]

## 2024-07-30 NOTE — Telephone Encounter (Unsigned)
 Copied from CRM [id number]. Topic: General - Other >> Jul 30, 2024  9:37 AM Rilla B wrote: Patient's daughter calling stating she need a HIPAA release so she is able to speak with [facility] regarding patients assistance as she cannot afford medication. Please send to [facility] # [phone].

## 2024-07-30 NOTE — Telephone Encounter (Signed)
 Received notification that patient's daughter was not listed as an alternative contact on the original PAP form. Updated the form and added Suzen [place] as a contact under phone number [phone] and faxed to [employer].

## 2024-07-31 ENCOUNTER — Emergency Department (HOSPITAL_COMMUNITY)

## 2024-07-31 ENCOUNTER — Emergency Department (HOSPITAL_COMMUNITY)
Admission: EM | Admit: 2024-07-31 | Discharge: 2024-07-31 | Disposition: A | Discharge status: Home / Self Care | Attending: Emergency Medicine | Admitting: Emergency Medicine

## 2024-07-31 ENCOUNTER — Encounter (HOSPITAL_COMMUNITY): Payer: Self-pay

## 2024-07-31 ENCOUNTER — Other Ambulatory Visit: Payer: Self-pay

## 2024-07-31 DIAGNOSIS — K4090 Unilateral inguinal hernia, without obstruction or gangrene, not specified as recurrent: Secondary | ICD-10-CM | POA: Insufficient documentation

## 2024-07-31 DIAGNOSIS — R911 Solitary pulmonary nodule: Secondary | ICD-10-CM | POA: Insufficient documentation

## 2024-07-31 DIAGNOSIS — K429 Umbilical hernia without obstruction or gangrene: Secondary | ICD-10-CM | POA: Diagnosis not present

## 2024-07-31 DIAGNOSIS — N281 Cyst of kidney, acquired: Secondary | ICD-10-CM | POA: Diagnosis not present

## 2024-07-31 DIAGNOSIS — R1030 Lower abdominal pain, unspecified: Secondary | ICD-10-CM | POA: Diagnosis present

## 2024-07-31 DIAGNOSIS — K5730 Diverticulosis of large intestine without perforation or abscess without bleeding: Secondary | ICD-10-CM | POA: Diagnosis not present

## 2024-07-31 LAB — CBC WITH DIFFERENTIAL/PLATELET
Abs Immature Granulocytes: 0.01 K/uL (ref 0.00–0.07)
Basophils Absolute: 0.1 K/uL (ref 0.0–0.1)
Basophils Relative: 1 %
Eosinophils Absolute: 0.2 K/uL (ref 0.0–0.5)
Eosinophils Relative: 5 %
HCT: 40.7 % (ref 36.0–46.0)
Hemoglobin: 13.6 g/dL (ref 12.0–15.0)
Immature Granulocytes: 0 %
Lymphocytes Relative: 17 %
Lymphs Abs: 0.9 K/uL (ref 0.7–4.0)
MCH: 30.6 pg (ref 26.0–34.0)
MCHC: 33.4 g/dL (ref 30.0–36.0)
MCV: 91.7 fL (ref 80.0–100.0)
Monocytes Absolute: 0.4 K/uL (ref 0.1–1.0)
Monocytes Relative: 8 %
Neutro Abs: 3.4 K/uL (ref 1.7–7.7)
Neutrophils Relative %: 69 %
Platelets: 202 K/uL (ref 150–400)
RBC: 4.44 MIL/uL (ref 3.87–5.11)
RDW: 12.5 % (ref 11.5–15.5)
WBC: 5 K/uL (ref 4.0–10.5)
nRBC: 0 % (ref 0.0–0.2)

## 2024-07-31 LAB — BASIC METABOLIC PANEL WITH GFR
Anion gap: 11 (ref 5–15)
BUN: 15 mg/dL (ref 8–23)
CO2: 24 mmol/L (ref 22–32)
Calcium: 9.1 mg/dL (ref 8.9–10.3)
Chloride: 101 mmol/L (ref 98–111)
Creatinine, Ser: 0.88 mg/dL (ref 0.44–1.00)
GFR, Estimated: >60 mL/min (ref >60)
Glucose, Bld: 127 mg/dL — ABNORMAL HIGH (ref 70–99)
Potassium: 4.2 mmol/L (ref 3.5–5.1)
Sodium: 136 mmol/L (ref 135–145)

## 2024-07-31 LAB — URINALYSIS, ROUTINE W REFLEX MICROSCOPIC
Bacteria, UA: NONE SEEN
Bilirubin Urine: NEGATIVE
Glucose, UA: NEGATIVE mg/dL
Hgb urine dipstick: NEGATIVE
Ketones, ur: NEGATIVE mg/dL
Nitrite: NEGATIVE
Protein, ur: NEGATIVE mg/dL
Specific Gravity, Urine: 1.004 — ABNORMAL LOW (ref 1.005–1.030)
pH: 7 (ref 5.0–8.0)

## 2024-07-31 MED ORDER — IOHEXOL 300 MG/ML  SOLN
100.0000 mL | Freq: Once | INTRAMUSCULAR | Status: AC | PRN
  Administered 2024-07-31: 100 mL via INTRAVENOUS

## 2024-07-31 MED ORDER — SODIUM CHLORIDE 0.9 % IV BOLUS
500.0000 mL | Freq: Once | INTRAVENOUS | Status: AC
  Administered 2024-07-31: 500 mL via INTRAVENOUS

## 2024-07-31 NOTE — Discharge Instructions (Signed)
 You were seen in the emergency department for lower abdominal pain and urinary symptoms.  Your urine did not show an obvious sign of infection.  Your CAT scan showed a right inguinal hernia with stool but no signs of incarceration that would require surgery.  Please increase your fluid and fiber intake.  Follow-up with your primary care doctor and general surgery.  You also had a lung nodule that will need follow-up.

## 2024-07-31 NOTE — ED Triage Notes (Signed)
 Pt been treated for UTI for last 3 weeks, on 2 different antibiotics with no relief. Pt states she was treated for UTI with E.Coli. Pt hx of HTN, no diabetes, no blood thinners.

## 2024-07-31 NOTE — ED Provider Notes (Signed)
 [place] [facility]ERGENCY DEPARTMENT AT [facility] Provider Note   CSN: [id number] Arrival date & time: 07/31/24  9274     Patient presents with: No chief complaint on file.   Christy Richardson is a 89 y.o. female.  She is here with complaint of urinary symptoms that have been ongoing for 3 weeks.  Lower abdominal pain and urinary hesitancy.  She has also had feeling of general weakness and nausea, decreased appetite.  Saw her primary care doctor and finished a course of Cipro .  Currently on Macrobid .  No improvement in her symptoms.  No fever.  No chest pain or shortness of breath.   The history is provided by the patient and a relative.  Abdominal Pain Pain location:  Suprapubic Pain quality: aching   Pain severity:  Moderate Onset quality:  Gradual Duration:  3 weeks Timing:  Intermittent Progression:  Unchanged Chronicity:  New Relieved by:  Nothing Associated symptoms: fatigue and nausea   Associated symptoms: no chest pain, no cough, no dysuria, no fever and no vomiting        Prior to Admission medications   Medication Sig Start Date End Date Taking? Authorizing Provider  albuterol  (PROVENTIL ) (2.5 MG/3ML) 0.083% nebulizer solution Take 3 mLs (2.5 mg total) by nebulization every 6 (six) hours as needed for wheezing or shortness of breath. 07/29/24   Cobb, Comer GAILS, NP  acetaminophen  (TYLENOL  8 HOUR ARTHRITIS PAIN) 650 MG CR tablet Take 650 mg by mouth every 8 (eight) hours as needed for pain.    [provider]  albuterol  (PROVENTIL ) (2.5 MG/3ML) 0.083% nebulizer solution Take 3 mLs (2.5 mg total) by nebulization every 6 (six) hours as needed for wheezing or shortness of breath. 07/16/24   Icard, Adine CROME, DO  albuterol  (VENTOLIN  HFA) 108 (90 Base) MCG/ACT inhaler Inhale 2 puffs into the lungs every 6 (six) hours as needed for wheezing or shortness of breath. Patient not taking: Reported on 07/22/2024 10/18/23   Brenna Adine L, DO  amLODipine  (NORVASC ) 5 MG  tablet Take 1 tablet (5 mg total) by mouth daily. Take an extra tablet (5 mg) daily as needed for systolic blood pressure greater then 170. 06/08/24   Gollan, Evalene PARAS, MD  brimonidine  (ALPHAGAN ) 0.2 % ophthalmic solution Place 1 drop into both eyes 2 (two) times daily. Place 1 drop into both eyes 2 (two) times daily. 12/21/15   [provider]  carvedilol  (COREG ) 12.5 MG tablet Take 1 tablet (12.5 mg total) by mouth 2 (two) times daily with a meal. 09/23/23   Gollan, Evalene PARAS, MD  cetirizine (ZYRTEC) 10 MG tablet Take 10 mg by mouth every morning. 03/30/11   Parrett, Madelin RAMAN, NP  Cholecalciferol (VITAMIN D3 PO) Take 1 tablet by mouth daily at 6 (six) AM. Patient not taking: Reported on 07/22/2024    [provider]  cyclobenzaprine  (FLEXERIL ) 10 MG tablet Take 10 mg by mouth 3 (three) times daily as needed for muscle spasms.    [provider]  dorzolamide  (TRUSOPT ) 2 % ophthalmic solution Place 1 drop into both eyes 2 (two) times daily. Place 1 drop into both eyes 2 (two) times daily. 11/03/15   [provider]  Ensifentrine  (OHTUVAYRE ) 3 MG/2.5ML SUSP Inhale 2.5 mLs into the lungs 2 (two) times daily. 07/22/24   Cobb, Comer GAILS, NP  estradiol  (ESTRACE ) 0.1 MG/GM vaginal cream Apply 0.5 gm vaginally with the applicator of tip of the finger nightly for 2 weeks and then 2-3x weekly.  04/18/23   Watt Rush, MD  famotidine  (PEPCID ) 20 MG tablet Take 1 tablet (20 mg total) by mouth 2 (two) times daily. 07/22/24   Cobb, Comer GAILS, NP  gabapentin (NEURONTIN) 300 MG capsule Take 300 mg by mouth at bedtime.    [provider]  gentamicin ointment (GARAMYCIN) 0.1 % as needed (nose irritation). 11/05/23   [provider]  guaifenesin  (MUCUS RELIEF) 400 MG TABS tablet Take 1,200 mg by mouth every morning.    [provider]  hydrALAZINE  (APRESOLINE ) 50 MG tablet Take 0.5 tablets (25 mg total) by mouth daily as needed. 09/27/22   Vivienne Lonni Ingle, NP  ipratropium (ATROVENT ) 0.06 % nasal spray Place 2 sprays into both nostrils 3 (three) times daily. 07/22/24   Malachy Comer GAILS, NP  levothyroxine  (SYNTHROID ) 75 MCG tablet Take 75 mcg by mouth daily before breakfast. 03/19/22   [provider]  LORazepam  (ATIVAN ) 0.5 MG tablet Take 0.5 mg by mouth 2 (two) times daily as needed for anxiety (tremors).    [provider]  losartan  (COZAAR ) 100 MG tablet TAKE ONE TABLET BY MOUTH ONCE DAILY. 07/28/24   Gollan, Timothy J, MD  Multiple Vitamins-Minerals (MULTIVITAMIN WITH MINERALS) tablet Take 1 tablet by mouth 3 (three) times a week.    [provider]  nitrofurantoin , macrocrystal-monohydrate, (MACROBID ) 100 MG capsule Take 100 mg by mouth every 12 (twelve) hours. 07/20/24   [provider]  pantoprazole  (PROTONIX ) 40 MG tablet Take 1 tablet (40 mg total) by mouth 2 (two) times daily. 05/26/24   Gollan, Timothy J, MD  Polyethyl Glycol-Propyl Glycol (SYSTANE OP) Apply 1 drop to eye as needed.    [provider]  pravastatin  (PRAVACHOL ) 40 MG tablet Take 1 tablet (40 mg total) by mouth at bedtime. 03/30/11   Parrett, Madelin RAMAN, NP  Probiotic Product (PROBIOTIC PO) Take 1 tablet by mouth daily at 6 (six) AM.    [provider]  rOPINIRole (REQUIP) 0.5 MG tablet Take 0.5 mg by mouth at bedtime. 09/05/22   [provider]  sertraline  (ZOLOFT ) 100 MG tablet Take 2 tablets by mouth every morning. 12/19/21   [provider]  sodium chloride  (OCEAN) 0.65 % nasal spray Place 2 sprays into both nostrils daily at 6 (six) AM. Use as needed 03/30/11   Parrett, Tammy S, NP  sodium chloride  HYPERTONIC 3 % nebulizer solution Take as 3 mL neb as needed for cough or chest congestion Patient taking differently: as needed. Take as 3 mL neb as needed for cough or chest congestion 02/08/23   Cobb, Comer GAILS, NP  Tiotropium Bromide-Olodaterol (STIOLTO RESPIMAT ) 2.5-2.5 MCG/ACT AERS Inhale 2 puffs into the  lungs daily. 12/02/23   Neysa Reggy BIRCH, MD  Tiotropium Bromide-Olodaterol (STIOLTO RESPIMAT ) 2.5-2.5 MCG/ACT AERS Inhale 2 puffs into the lungs daily. Patient not taking: Reported on 07/22/2024 12/19/23   Brenna Cain L, DO  traMADol  (ULTRAM ) 50 MG tablet Take 1 tablet (50 mg total) by mouth every 6 (six) hours as needed. 07/13/22   Charlene Debby BROCKS, PA-C  traZODone  (DESYREL ) 50 MG tablet Take 50 mg by mouth at bedtime.    [provider]  triamcinolone  (NASACORT ) 55 MCG/ACT AERO nasal inhaler Place 1 spray into the nose at bedtime.    [provider]    Allergies: Prednisone, Tramadol , Adhesive [tape], Penicillins, and Sulfonamide derivatives    Review of Systems  Constitutional:  Positive for fatigue. Negative for fever.  Respiratory:  Negative for cough.  Cardiovascular:  Negative for chest pain.  Gastrointestinal:  Positive for abdominal pain and nausea. Negative for vomiting.  Genitourinary:  Positive for difficulty urinating. Negative for dysuria.    Updated Vital Signs BP 113/60   Pulse 72   Temp 98.1 F (36.7 C) (Oral)   Resp (!) 23   Ht 5' 8 (1.727 m)   Wt 71.2 kg   SpO2 95%   BMI 23.87 kg/m   Physical Exam Vitals and nursing note reviewed.  Constitutional:      General: She is not in acute distress.    Appearance: Normal appearance. She is well-developed.  HENT:     Head: Normocephalic and atraumatic.  Eyes:     Conjunctiva/sclera: Conjunctivae normal.  Cardiovascular:     Rate and Rhythm: Normal rate and regular rhythm.     Heart sounds: No murmur heard. Pulmonary:     Effort: Pulmonary effort is normal. No respiratory distress.     Breath sounds: Normal breath sounds. No stridor. No wheezing.  Abdominal:     Palpations: Abdomen is soft.     Tenderness: There is no abdominal tenderness. There is no guarding or rebound.  Musculoskeletal:        General: No tenderness or deformity.     Cervical back: Neck supple.  Skin:    General:  Skin is warm and dry.  Neurological:     General: No focal deficit present.     Mental Status: She is alert.     GCS: GCS eye subscore is 4. GCS verbal subscore is 5. GCS motor subscore is 6.     (all labs ordered are listed, but only abnormal results are displayed) Labs Reviewed  BASIC METABOLIC PANEL WITH GFR - Abnormal; Notable for the following components:      Result Value   Glucose, Bld 127 (*)    All other components within normal limits  URINALYSIS, ROUTINE W REFLEX MICROSCOPIC - Abnormal; Notable for the following components:   Specific Gravity, Urine 1.004 (*)    Leukocytes,Ua SMALL (*)    All other components within normal limits  URINE CULTURE  CBC WITH DIFFERENTIAL/PLATELET    EKG: None  Radiology: CT ABDOMEN PELVIS W CONTRAST Result Date: 07/31/2024 CLINICAL DATA:  LLQ abdominal pain lower abd pain. EXAM: CT ABDOMEN AND PELVIS WITH CONTRAST TECHNIQUE: Multidetector CT imaging of the abdomen and pelvis was performed using the standard protocol following bolus administration of intravenous contrast. RADIATION DOSE REDUCTION: This exam was performed according to the departmental dose-optimization program which includes automated exposure control, adjustment of the mA and/or kV according to patient size and/or use of iterative reconstruction technique. CONTRAST:  OMNIPAQUE  IOHEXOL  300 MG/ML  SOLN COMPARISON:  CT scan abdomen and pelvis from 03/21/2018. FINDINGS: Lower chest: There are multiple noncalcified nodules in the left lung. Two adjacent nodules (series 3, images 16 and 17), are unchanged since the prior study from 08/31/2021 and favored benign. However, there is a subpleural 5 x 8 mm nodule in the left lung lower lobe (series 3, image 15), which is new since the prior study from 08/31/2021. Correlate clinically to determine the need for repeat examination in 3-6 months to document stability. There are patchy atelectatic changes in the visualized lung bases. No overt  consolidation. No pleural effusion. The heart is normal in size. No pericardial effusion. Hepatobiliary: The liver is normal in size. Non-cirrhotic configuration. No suspicious mass. No intrahepatic bile duct dilation. There is mild prominence of the extrahepatic bile duct,  which gradually tapers near the ampulla of Vater. The appearance is essentially unchanged since the prior study and likely age-related. Correlate with liver function tests to determine the need for additional imaging. No calcified gallstones. Normal gallbladder wall thickness. No pericholecystic inflammatory changes. Pancreas: Unremarkable. No pancreatic ductal dilatation or surrounding inflammatory changes. Spleen: Within normal limits. No focal lesion. Adrenals/Urinary Tract: Adrenal glands are unremarkable. No suspicious renal mass. There is a partially exophytic 9 x 11 mm cyst arising from the left kidney upper pole, laterally. No nephroureterolithiasis or obstructive uropathy. Please note inferior portion of the urinary bladder is not evaluated due to extensive streak artifacts from bilateral hip arthroplasty hardware. In the visualized portion, urinary bladder is unremarkable. Stomach/Bowel: The appearance of GE junction favors prior fundoplication. Correlate with surgical history. No disproportionate dilation of the small or large bowel loops. No evidence of abnormal bowel wall thickening or inflammatory changes. The appendix is unremarkable. There is moderate to large stool burden. There are multiple diverticula mainly in the sigmoid colon, without imaging signs of diverticulitis. Vascular/Lymphatic: No ascites or pneumoperitoneum. No abdominal or pelvic lymphadenopathy, by size criteria. No aneurysmal dilation of the major abdominal arteries. There are mild peripheral atherosclerotic vascular calcifications of the aorta and its major branches. Reproductive: Not evaluated due to extensive streak artifacts from bilateral hip arthroplasty.  Other: There is a moderate sized right inguinal hernia containing distal ileal loops, which are fecalized. Findings are nonspecific and may be due to constipation versus incompetent ileocecal valve. There is a tiny fat containing umbilical hernia. There is small amount of fat and fluid in the left femoral hernia. Musculoskeletal: No suspicious osseous lesions. There are moderate multilevel degenerative changes in the visualized spine. Bilateral hip arthroplasty noted. IMPRESSION: 1. No acute inflammatory process identified within the abdomen or pelvis. 2. There is a moderate sized right inguinal hernia containing distal ileal loops, which are fecalized. Findings are nonspecific and may be due to constipation versus incompetent ileocecal valve. No bowel obstruction. Moderate-to-large stool burden. 3. There is a 5 x 8 mm subpleural nodule in the left lung lower lobe, which is new since the prior study from 08/31/2021. Correlate clinically to determine the need for repeat examination in 3-6 months to document stability. 4. Multiple other nonacute observations, as described above. Aortic Atherosclerosis (ICD10-I70.0). Electronically Signed   By: Ree Molt M.D.   On: 07/31/2024 09:38     Procedures   Medications Ordered in the ED  sodium chloride  0.9 % bolus 500 mL (0 mLs Intravenous Stopped 07/31/24 0911)  iohexol  (OMNIPAQUE ) 300 MG/ML solution 100 mL (100 mLs Intravenous Contrast Given 07/31/24 0902)                                    Medical Decision Making Amount and/or Complexity of Data Reviewed Labs: ordered. Radiology: ordered.  Risk Prescription drug management.   This patient complains of lower abdominal pain urinary symptoms; this involves an extensive number of treatment Options and is a complaint that carries with it a high risk of complications and morbidity. The differential includes UTI, pyelonephritis, diverticulitis, colitis, obstruction, perforation  I ordered, reviewed  and interpreted labs, which included CBC normal, chemistries normal, urinalysis negative I ordered medication IV fluids and reviewed PMP when indicated. I ordered imaging studies which included CT abdomen and pelvis and I independently    visualized and interpreted imaging which showed right inguinal hernia with fecalization, pulmonary  nodule, no acute findings Additional history obtained from patient's daughter Previous records obtained and reviewed in epic including recent PCP notes and urine cultures Cardiac monitoring reviewed, normal sinus rhythm Social determinants considered, no significant barriers Critical Interventions: None  After the interventions stated above, I reevaluated the patient and found patient to have benign abdominal exam and no evidence of incarcerated hernia Admission and further testing considered, recommended close follow-up with PCP and given contact information for general surgery.  Discussed bowel regiment.  Return instructions discussed      Final diagnoses:  Right inguinal hernia  Lung nodule    ED Discharge Orders     None          Towana Ozell BROCKS, MD 07/31/24 1740

## 2024-08-01 LAB — URINE CULTURE
Culture: NO GROWTH
Special Requests: NORMAL

## 2024-08-04 NOTE — Telephone Encounter (Signed)
 Received fax from [employer] stating that application could not be processed due to missing medical insurance. It's worth noting that the ID number appears to be different. Contacted [employer] and confirmed that indeed a second account had been created off of the updated application that was sent in. This has been corrected, however the original issue of allowing the pt's daughter to speak with [employer] reps has not. Per the rep, the pt still needs to provide verbal consent that her daughter may speak on her behalf (after first verifying name, DoB, and zip code) in order to fully complete the authorization process. She also notes that they are currently waiting on a prior authorization to be completed as well.   Contacted pt's daughter and provided update, she states that her mother had actually been doing some reading up on the medication and has decided that she may not even want to proceed with it at this time due to the possible side effects. I informed her that I have been hearing a good bit of positive feedback about the medication but that I understood the patient's feelings on the matter. I also advised that I would work on completing the PA so that the pharmacy could provide them with an actual copay amount that may have some influence on whether the pt actually tries the medication. Pt's daughter verbalized appreciation and understanding.

## 2024-08-06 ENCOUNTER — Encounter: Payer: Self-pay | Admitting: Gastroenterology

## 2024-08-06 ENCOUNTER — Other Ambulatory Visit: Payer: Self-pay | Admitting: Cardiovascular Disease

## 2024-08-06 DIAGNOSIS — E785 Hyperlipidemia, unspecified: Secondary | ICD-10-CM | POA: Diagnosis not present

## 2024-08-06 DIAGNOSIS — R809 Proteinuria, unspecified: Secondary | ICD-10-CM | POA: Diagnosis not present

## 2024-08-06 DIAGNOSIS — F411 Generalized anxiety disorder: Secondary | ICD-10-CM | POA: Diagnosis not present

## 2024-08-06 DIAGNOSIS — G2581 Restless legs syndrome: Secondary | ICD-10-CM | POA: Diagnosis not present

## 2024-08-06 DIAGNOSIS — R1013 Epigastric pain: Secondary | ICD-10-CM | POA: Diagnosis not present

## 2024-08-06 DIAGNOSIS — K4090 Unilateral inguinal hernia, without obstruction or gangrene, not specified as recurrent: Secondary | ICD-10-CM | POA: Diagnosis not present

## 2024-08-06 DIAGNOSIS — N3021 Other chronic cystitis with hematuria: Secondary | ICD-10-CM | POA: Diagnosis not present

## 2024-08-06 DIAGNOSIS — E039 Hypothyroidism, unspecified: Secondary | ICD-10-CM | POA: Diagnosis not present

## 2024-08-06 DIAGNOSIS — R7303 Prediabetes: Secondary | ICD-10-CM | POA: Diagnosis not present

## 2024-08-06 DIAGNOSIS — R600 Localized edema: Secondary | ICD-10-CM | POA: Diagnosis not present

## 2024-08-06 DIAGNOSIS — G629 Polyneuropathy, unspecified: Secondary | ICD-10-CM | POA: Diagnosis not present

## 2024-08-06 DIAGNOSIS — R911 Solitary pulmonary nodule: Secondary | ICD-10-CM | POA: Diagnosis not present

## 2024-08-06 DIAGNOSIS — I872 Venous insufficiency (chronic) (peripheral): Secondary | ICD-10-CM | POA: Diagnosis not present

## 2024-08-06 DIAGNOSIS — K219 Gastro-esophageal reflux disease without esophagitis: Secondary | ICD-10-CM | POA: Diagnosis not present

## 2024-08-06 DIAGNOSIS — K5900 Constipation, unspecified: Secondary | ICD-10-CM | POA: Diagnosis not present

## 2024-08-06 NOTE — Telephone Encounter (Signed)
 Submitted a Prior Authorization request to [facility] ADVANTAGE/RX ADVANCE for OHTUVAYRE  via CoverMyMeds. Will update once we receive a response.  Key: [id number]

## 2024-08-07 ENCOUNTER — Other Ambulatory Visit (HOSPITAL_COMMUNITY): Payer: Self-pay

## 2024-08-07 NOTE — Telephone Encounter (Signed)
 Received fax from [employer] with summary of benefits. Referral form for Ohtuvayre  received. Rx will be triaged to [facility].. Once benefits investigation completed, pharmacy will reach out the patient to schedule shipment. If medication is unaffordable, patient will need to express financial hardship to be referred back to [facility] for patient assistance program pre-screening.   Patient ID: [id number] Pharmacy phone: [phone] [facility] Phone#: [phone]

## 2024-08-07 NOTE — Telephone Encounter (Signed)
 Received notification from [employer] regarding a prior authorization for OHTUVAYRE . Authorization has been APPROVED from 08/06/2024 to 12/02/2024. Approval letter has not yet been received but will be sent to scan center once obtained.  Per test claim, copay for 30 days supply is $565.75  Authorization # [id number]   Patient will most likely need to be considered for PAP if she ultimately decides to move forward to the medication.

## 2024-09-01 DIAGNOSIS — Z0000 Encounter for general adult medical examination without abnormal findings: Secondary | ICD-10-CM | POA: Diagnosis not present

## 2024-09-01 NOTE — Progress Notes (Unsigned)
 Established Patient Pulmonology Office Visit   Subjective:  Patient ID: Christy Richardson, female    DOB: 1935/10/23  MRN: [id number]  CC: No chief complaint on file.   HPI Christy Richardson an 89 y/o F with a PMH significant for bronchiectasis and pulmonary nodules who Richardson here for follow up.  She was last see by Dr. Brenna in 08/2023. At the time, she was doing well on stiolto, chest physiotherapy with flutter valve and vest as well as bronchodilators.    {PULM QUESTIONNAIRES (Optional):33196}  ROS  {History (Optional):23778}  Current Outpatient Medications:    albuterol  (PROVENTIL ) (2.5 MG/3ML) 0.083% nebulizer solution, Take 3 mLs (2.5 mg total) by nebulization every 6 (six) hours as needed for wheezing or shortness of breath., Disp: 75 mL, Rfl: 12   acetaminophen  (TYLENOL  8 HOUR ARTHRITIS PAIN) 650 MG CR tablet, Take 650 mg by mouth every 8 (eight) hours as needed for pain., Disp: , Rfl:    albuterol  (PROVENTIL ) (2.5 MG/3ML) 0.083% nebulizer solution, Take 3 mLs (2.5 mg total) by nebulization every 6 (six) hours as needed for wheezing or shortness of breath., Disp: 360 mL, Rfl: 2   albuterol  (VENTOLIN  HFA) 108 (90 Base) MCG/ACT inhaler, Inhale 2 puffs into the lungs every 6 (six) hours as needed for wheezing or shortness of breath. (Patient not taking: Reported on 07/22/2024), Disp: 8 g, Rfl: 6   amLODipine  (NORVASC ) 5 MG tablet, Take 1 tablet (5 mg total) by mouth daily. Take an extra tablet (5 mg) daily as needed for systolic blood pressure greater then 170., Disp: 180 tablet, Rfl: 3   brimonidine  (ALPHAGAN ) 0.2 % ophthalmic solution, Place 1 drop into both eyes 2 (two) times daily. Place 1 drop into both eyes 2 (two) times daily., Disp: , Rfl:    carvedilol  (COREG ) 12.5 MG tablet, Take 1 tablet (12.5 mg total) by mouth 2 (two) times daily with a meal., Disp: 180 tablet, Rfl: 2   cetirizine (ZYRTEC) 10 MG tablet, Take 10 mg by mouth every morning., Disp: , Rfl:    Cholecalciferol (VITAMIN  D3 PO), Take 1 tablet by mouth daily at 6 (six) AM. (Patient not taking: Reported on 07/22/2024), Disp: , Rfl:    cyclobenzaprine  (FLEXERIL ) 10 MG tablet, Take 10 mg by mouth 3 (three) times daily as needed for muscle spasms., Disp: , Rfl:    dorzolamide  (TRUSOPT ) 2 % ophthalmic solution, Place 1 drop into both eyes 2 (two) times daily. Place 1 drop into both eyes 2 (two) times daily., Disp: , Rfl:    Ensifentrine  (OHTUVAYRE ) 3 MG/2.5ML SUSP, Inhale 2.5 mLs into the lungs 2 (two) times daily., Disp: , Rfl:    estradiol  (ESTRACE ) 0.1 MG/GM vaginal cream, Apply 0.5 gm vaginally with the applicator of tip of the finger nightly for 2 weeks and then 2-3x weekly., Disp: 42.5 g, Rfl: 12   famotidine  (PEPCID ) 20 MG tablet, Take 1 tablet (20 mg total) by mouth 2 (two) times daily., Disp: 60 tablet, Rfl: 1   gabapentin (NEURONTIN) 300 MG capsule, Take 300 mg by mouth at bedtime., Disp: , Rfl:    gentamicin ointment (GARAMYCIN) 0.1 %, as needed (nose irritation)., Disp: , Rfl:    guaifenesin  (MUCUS RELIEF) 400 MG TABS tablet, Take 1,200 mg by mouth every morning., Disp: , Rfl:    hydrALAZINE  (APRESOLINE ) 50 MG tablet, Take 0.5 tablets (25 mg total) by mouth daily as needed., Disp: 15 tablet, Rfl: 0   ipratropium (ATROVENT ) 0.06 % nasal spray, Place 2  sprays into both nostrils 3 (three) times daily., Disp: 15 mL, Rfl: 12   levothyroxine  (SYNTHROID ) 75 MCG tablet, Take 75 mcg by mouth daily before breakfast., Disp: , Rfl:    LORazepam  (ATIVAN ) 0.5 MG tablet, Take 0.5 mg by mouth 2 (two) times daily as needed for anxiety (tremors)., Disp: , Rfl:    losartan  (COZAAR ) 100 MG tablet, TAKE ONE TABLET BY MOUTH ONCE DAILY., Disp: 90 tablet, Rfl: 3   Multiple Vitamins-Minerals (MULTIVITAMIN WITH MINERALS) tablet, Take 1 tablet by mouth 3 (three) times a week., Disp: , Rfl:    nitrofurantoin , macrocrystal-monohydrate, (MACROBID ) 100 MG capsule, Take 100 mg by mouth every 12 (twelve) hours., Disp: , Rfl:    pantoprazole   (PROTONIX ) 40 MG tablet, Take 1 tablet (40 mg total) by mouth 2 (two) times daily., Disp: 180 tablet, Rfl: 3   Polyethyl Glycol-Propyl Glycol (SYSTANE OP), Apply 1 drop to eye as needed., Disp: , Rfl:    pravastatin  (PRAVACHOL ) 40 MG tablet, Take 1 tablet (40 mg total) by mouth at bedtime., Disp: , Rfl:    Probiotic Product (PROBIOTIC PO), Take 1 tablet by mouth daily at 6 (six) AM., Disp: , Rfl:    rOPINIRole (REQUIP) 0.5 MG tablet, Take 0.5 mg by mouth at bedtime., Disp: , Rfl:    sertraline  (ZOLOFT ) 100 MG tablet, Take 2 tablets by mouth every morning., Disp: , Rfl:    sodium chloride  (OCEAN) 0.65 % nasal spray, Place 2 sprays into both nostrils daily at 6 (six) AM. Use as needed, Disp: , Rfl:    sodium chloride  HYPERTONIC 3 % nebulizer solution, Take as 3 mL neb as needed for cough or chest congestion (Patient taking differently: as needed. Take as 3 mL neb as needed for cough or chest congestion), Disp: 750 mL, Rfl: 12   Tiotropium Bromide-Olodaterol (STIOLTO RESPIMAT ) 2.5-2.5 MCG/ACT AERS, Inhale 2 puffs into the lungs daily., Disp: , Rfl:    Tiotropium Bromide-Olodaterol (STIOLTO RESPIMAT ) 2.5-2.5 MCG/ACT AERS, Inhale 2 puffs into the lungs daily. (Patient not taking: Reported on 07/22/2024), Disp: 4 g, Rfl: 5   traMADol  (ULTRAM ) 50 MG tablet, Take 1 tablet (50 mg total) by mouth every 6 (six) hours as needed., Disp: 30 tablet, Rfl: 0   traZODone  (DESYREL ) 50 MG tablet, Take 50 mg by mouth at bedtime., Disp: , Rfl:    triamcinolone  (NASACORT ) 55 MCG/ACT AERO nasal inhaler, Place 1 spray into the nose at bedtime., Disp: , Rfl:       Objective:  There were no vitals taken for this visit. {Pulm Vitals (Optional):32837}  Physical Exam   Diagnostic Review:  {Labs (Optional):32838}  CT Chest 9/22: Lungs/Pleura: Bronchiectatic changes with middle lobe and lower lobe predominance on the RIGHT and lingular and lower lobe predominance on the LEFT.   4 mm LEFT upper lobe pulmonary nodule.  This Richardson stable compared to the previous study (image 82/4)   Scattered small areas of nodularity in the LEFT lung base also without change. No consolidation. Biapical scarring similar to previous imaging.   RIGHT upper lobe pulmonary nodule (image 75/4) 5 mm, unchanged.   Juxtapleural pulmonary nodule in the RIGHT lower lobe (image 57/4) 6 mm, also unchanged. Airways are patent. No effusion. No consolidative process.  PFTs 01/2023: mild obstructive lung disease with moderate reduction in diffusion capacity    Assessment & Plan:   Assessment & Plan COPD, mild (HCC)  Bronchiectasis without complication (HCC)  Chronic rhinitis   No orders of the defined types were placed in  this encounter.     No follow-ups on file.   Yovanni Frenette, MD

## 2024-09-02 ENCOUNTER — Encounter: Payer: Self-pay | Admitting: Pulmonary Disease

## 2024-09-02 ENCOUNTER — Ambulatory Visit (HOSPITAL_COMMUNITY)
Admission: RE | Admit: 2024-09-02 | Discharge: 2024-09-02 | Disposition: A | Discharge status: Home / Self Care | Source: Ambulatory Visit | Attending: Pulmonary Disease | Admitting: Pulmonary Disease

## 2024-09-02 ENCOUNTER — Ambulatory Visit: Admitting: Pulmonary Disease

## 2024-09-02 VITALS — BP 156/79 | HR 84 | Ht 68.0 in | Wt 158.4 lb

## 2024-09-02 DIAGNOSIS — J189 Pneumonia, unspecified organism: Secondary | ICD-10-CM | POA: Diagnosis not present

## 2024-09-02 DIAGNOSIS — J986 Disorders of diaphragm: Secondary | ICD-10-CM

## 2024-09-02 DIAGNOSIS — J9811 Atelectasis: Secondary | ICD-10-CM | POA: Diagnosis not present

## 2024-09-02 DIAGNOSIS — R0609 Other forms of dyspnea: Secondary | ICD-10-CM

## 2024-09-02 DIAGNOSIS — J479 Bronchiectasis, uncomplicated: Secondary | ICD-10-CM

## 2024-09-02 DIAGNOSIS — J310 Chronic rhinitis: Secondary | ICD-10-CM | POA: Diagnosis not present

## 2024-09-02 DIAGNOSIS — I10 Essential (primary) hypertension: Secondary | ICD-10-CM | POA: Diagnosis not present

## 2024-09-02 DIAGNOSIS — R0602 Shortness of breath: Secondary | ICD-10-CM | POA: Diagnosis not present

## 2024-09-02 DIAGNOSIS — J449 Chronic obstructive pulmonary disease, unspecified: Secondary | ICD-10-CM | POA: Diagnosis not present

## 2024-09-02 NOTE — Patient Instructions (Signed)
-   Please get blood draw and CXR done - I'll call you when I get the result  HAPPY BIRTHDAY!

## 2024-09-02 NOTE — Assessment & Plan Note (Signed)
 Patient reports progressive dyspnea on exertion of unclear etiology. She does appear slightly hypervolemic on examination so there's a possibility of CHF playing a role. I advised her to discuss diuretic therapy with cardiologist and to obtain echocardiogram. Other differentials include R hemidiaphragmatic paralysis given chronic elevation of right hemidiaphragm on chest imaging. Will order SNIFF test to better evaluate. I also ordered CXR to rule out pneumonia and D dimer test to rule out clots. She does have remote hx of DVT in the past. She is anemic.

## 2024-09-03 ENCOUNTER — Ambulatory Visit: Payer: Self-pay | Admitting: Pulmonary Disease

## 2024-09-03 LAB — D-DIMER, QUANTITATIVE: D-DIMER: 0.64 mg{FEU}/L — ABNORMAL HIGH (ref 0.00–0.49)

## 2024-09-03 NOTE — Progress Notes (Signed)
 Age adjusted D DIMER is 0.87. Our result is less than that. Therefore, low probability for DVT/PE. Please inform patient that her clotting test is negative which is good news.

## 2024-09-03 NOTE — Progress Notes (Signed)
 Called to inform pt of her results, pt confirmed understanding.

## 2024-09-04 ENCOUNTER — Telehealth: Payer: Self-pay | Admitting: Pulmonary Disease

## 2024-09-04 NOTE — Telephone Encounter (Signed)
 Called patient to discuss scheduling the Sniff test that was ordered by Dr. Gregory states she does not know what that is and would like a call back from the nurse----call back [phone].  She will then decide if she wants to schedule.

## 2024-09-04 NOTE — Telephone Encounter (Signed)
 Called and spoke with pt - Pt seemed hesitate but agreed on sniff test

## 2024-09-07 ENCOUNTER — Telehealth: Payer: Self-pay | Admitting: Cardiovascular Disease

## 2024-09-07 NOTE — Telephone Encounter (Unsigned)
 Copied from CRM #[id number]. Topic: Appointments - Scheduling Inquiry for Clinic >> Sep 07, 2024  2:22 PM Christy Richardson wrote: Reason for CRM: Patient would like more information in regard  to what Richardson sniff test - would like to cancel her upcoming appointment on 10/10, states the office scheduled this appointment.

## 2024-09-07 NOTE — Telephone Encounter (Signed)
 Called patient regarding concerns. Patient states she is concerned over the increased lower extremity swelling, especially on her left ankle/foot. Pt denies shortness of breath other than her concurrent lung issues she sees pulmonary for. She does not typically weigh herself but states that she has enough doctors appointments to where she is constantly being weighed and hasn't really noticed a difference at this time. She did reach out to her primary and states they did not change or add anything. I made her an appointment with Lonni Meager, NP for this Wednesday for further evaluation. Patient verbalized understanding.

## 2024-09-07 NOTE — Telephone Encounter (Signed)
 Pt c/o swelling: STAT is pt has developed SOB within 24 hours  How much weight have you gained and in what time span?  No   If swelling, where is the swelling located?  Feet and ankles  Are you currently taking a fluid pill?  No   Are you currently SOB?  No   Do you have a log of your daily weights (if so, list)?   Have you gained 3 pounds in a day or 5 pounds in a week?   Have you traveled recently?  No

## 2024-09-07 NOTE — Telephone Encounter (Signed)
 Spoke with patient regarding the Friday 09/11/24 11:30 am Sniff test scheduled at [facility] time is 11:15 am ---1st floor radiology for check in----patient voiced her understanding

## 2024-09-08 NOTE — Telephone Encounter (Signed)
 Spoke with patient regarding the Monday 09/14/24 1:30 pm Sniff test appointment at [facility] time is 1:15 pm---1st floor registration desk for check in---patient voiced her understanding

## 2024-09-08 NOTE — Telephone Encounter (Signed)
  Encounter Date: 09/04/2024   Signed      Called and spoke with pt - Pt seemed hesitate but agreed on sniff test        Electronically signed by Ruffus Lucie DASEN, CMA at 09/04/2024  2:15 PM

## 2024-09-08 NOTE — Progress Notes (Signed)
 Discussed result with patient and told her to schedule SNIFF test prior to next appointment on a non-urgent basis.

## 2024-09-09 ENCOUNTER — Encounter: Payer: Self-pay | Admitting: Nurse Practitioner

## 2024-09-09 ENCOUNTER — Ambulatory Visit: Attending: Nurse Practitioner | Admitting: Nurse Practitioner

## 2024-09-09 VITALS — BP 110/60 | HR 86 | Ht 68.0 in | Wt 159.0 lb

## 2024-09-09 DIAGNOSIS — M25472 Effusion, left ankle: Secondary | ICD-10-CM | POA: Diagnosis not present

## 2024-09-09 DIAGNOSIS — K219 Gastro-esophageal reflux disease without esophagitis: Secondary | ICD-10-CM

## 2024-09-09 DIAGNOSIS — I10 Essential (primary) hypertension: Secondary | ICD-10-CM

## 2024-09-09 DIAGNOSIS — E782 Mixed hyperlipidemia: Secondary | ICD-10-CM

## 2024-09-09 DIAGNOSIS — R072 Precordial pain: Secondary | ICD-10-CM

## 2024-09-09 DIAGNOSIS — R0600 Dyspnea, unspecified: Secondary | ICD-10-CM

## 2024-09-09 NOTE — Patient Instructions (Signed)
 Medication Instructions:   Your physician recommends that you continue on your current medications as directed. Please refer to the Current Medication list given to you today.    *If you need a refill on your cardiac medications before your next appointment, please call your pharmacy*  Lab Work:  None ordered at this time   If you have labs (blood work) drawn today and your tests are completely normal, you will receive your results only by:  MyChart Message (if you have MyChart) OR  A paper copy in the mail If you have any lab test that is abnormal or we need to change your treatment, we will call you to review the results.  Testing/Procedures:  Your physician has requested that you have an echocardiogram. Echocardiography is a painless test that uses sound waves to create images of your heart. It provides your doctor with information about the size and shape of your heart and how well your heart's chambers and valves are working.   You may receive an ultrasound enhancing agent through an IV if needed to better visualize your heart during the echo. This procedure takes approximately one hour.  There are no restrictions for this procedure.  This will take place at [address] [facility] [address], [place] [address]  Please note: We ask at that you not bring children with you during ultrasound (echo/ vascular) testing. Due to room size and safety concerns, children are not allowed in the ultrasound rooms during exams. Our front office staff cannot provide observation of children in our lobby area while testing is being conducted. An adult accompanying a patient to their appointment will only be allowed in the ultrasound room at the discretion of the ultrasound technician under special circumstances. We apologize for any inconvenience.   Referrals:  None ordered at this time   Follow-Up:  At [facility], you and your health needs are our priority.  As part of  our continuing mission to provide you with exceptional heart care, our providers are all part of one team.  This team includes your primary Cardiologist (physician) and Advanced Practice Providers or APPs (Physician Assistants and Nurse Practitioners) who all work together to provide you with the care you need, when you need it.  Your next appointment:   6 week(s)  Provider:    Evalene Lunger, MD or Lonni Meager, NP    We recommend signing up for the patient portal called MyChart.  Sign up information is provided on this After Visit Summary.  MyChart is used to connect with patients for Virtual Visits (Telemedicine).  Patients are able to view lab/test results, encounter notes, upcoming appointments, etc.  Non-urgent messages can be sent to your provider as well.   To learn more about what you can do with MyChart, go to [email or web address].

## 2024-09-09 NOTE — Progress Notes (Signed)
 Office Visit    Patient Name: Christy Richardson Date of Encounter: 09/09/2024  Primary Care Provider:  Shona Norleen PEDLAR, MD Primary Cardiologist:  Christy Lunger, MD    Chief Complaint    89 y.o. female with a history of labile hypertension, anxiety, bronchiectasis, chronic dyspnea, GERD, goiter, hyperlipidemia, and diastolic dysfunction, presents for follow-up related to left leg swelling.   Past Medical History   Subjective   Past Medical History:  Diagnosis Date   Adenomatous polyp of colon 03/2007   Allergic rhinitis    Anxiety    a.) on BZO (lorazepam ) PRN   Arthritis    Blood transfusion 1974   Bronchiectasis (HCC)    Cancer (HCC)    left arm   Deaf    in right 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 Mild TR; b.) TTE 07/16/2019: EF 60-65%, triv AR, mild MR/TR, G1DD.   Diet-controlled type 2 diabetes mellitus (HCC)    Diverticulosis of colon (without mention of hemorrhage)    DOE (dyspnea on exertion)    Dyspnea    Fatigue    Fecal incontinence    GERD (gastroesophageal reflux disease)    Glaucoma    Goiter    right side   Heart murmur    History of stress test    a. 01/2014 Lexi MV: no ischemia/infarct.   Hyperkalemia    Hyperlipidemia    Hypertension    a. 11/2018 Renal artery duplex: No RAS.    Hypothyroidism    IBS (irritable bowel syndrome)    Migraine    Mitral valve prolapse    a. 01/2014 Echo: no MR/MVP noted.   Neck mass    right side/ no airway problems/ per pt has had since 2005 (goiter)   Nonspecific abnormal electrocardiogram (ECG) (EKG)    Palpitations    a. 01/2014 CardioNet: wore for 9 days (rash). Sinus tach - 100's. No SVT/AF.   Pneumonia    Polyp, sigmoid colon    Post-operative nausea and vomiting    Pulmonary fibrosis (HCC)    Pulmonary nodules    Recurrent UTI    Tachycardia    Tremor    Past Surgical History:  Procedure Laterality  Date   ABDOMINAL HYSTERECTOMY     CARDIOVASCULAR STRESS TEST  09/19/2011   No scintigraphic evidence of inducible myocardial ischemia. Pharmacological stress test without chest pain or EKG changes for ischemia.   COLONOSCOPY     ESOPHAGOGASTRODUODENOSCOPY     MASTOIDECTOMY     rt ear/ as a teenager   OOPHORECTOMY     SKIN CANCER EXCISION Left    arm   TOTAL HIP ARTHROPLASTY  5/10   left   TOTAL HIP ARTHROPLASTY Right 07/10/2022   Procedure: TOTAL HIP ARTHROPLASTY ANTERIOR APPROACH;  Surgeon: Christy Sharper, MD;  Location: [facility] ORS;  Service: Orthopedics;  Laterality: Right;   TRANSTHORACIC ECHOCARDIOGRAM  09/19/2011   EF >55%, stage 1 diastolic dysfunction, mild tricuspid valve regurg    Allergies  Allergies  Allergen Reactions   Prednisone     REACTION: Increase eye pressure with gloucoma. Broke out in rash after injection per Dr. Beverley   Tramadol  Itching   Adhesive [Tape]     Causes blisters/irritates skin-surgical tape   Penicillins     REACTION: Rash   Sulfonamide Derivatives     REACTION: Rash - not sure  History of Present Illness      89 y.o. y/o female with the above past medical history including labile hypertension, mitral valve prolapse, anxiety, bronchiectasis, chronic dyspnea, GERD, goiter, hyperlipidemia, and diastolic dysfunction.  She previously underwent evaluation for dyspnea with stress testing (nonischemic) and echocardiogram (normal EF, grade 1 diastolic dysfunction) in March 2015.  In the setting of bronchiectasis and chronic dyspnea, she has been followed closely by pulmonology.  Hypertension has been somewhat difficult to manage in the setting of variable blood pressure recordings at home along with orthostatic symptoms.  Most recent echo in August 2020 showed an EF of 60 to 65% with impaired relaxation and normal RV function.  Mild MR and TR along with trivial AI were noted.  In June 2024, she called our office and reported palpitations and  tachycardia.  A ZIO monitor was ordered however, patient ended up not wearing.     [name] was last seen in cardiology clinic in June 2025, at which time she was doing well with the exception of mild ankle edema, potentially from amlodipine  (has been on for many years).  She presents to the office today with continued complaints of left ankle swelling. She denies any leg pain or tenderness, just reports swelling that she believes has worsened over the past two months. Last US  12/2023 negative for DVT.  Saw pulm 10/1 and D-Dimer was eval and was 0.64, which is negative after adjustment for age.   Additionally, she has chronic dyspnea that hasn't worsened or improved. She has bronchiectasis, being followed by pulmonary, recent CT showing chronic elevated right hemidiaphragm (now pending SNIFF test).  She also has what she describes as chest tightness in the setting of severe GERD.  She explains that most of her chest discomfort is post-prandial but it can also accompany dyspnea on exertion.  She also has occasional chest wall tenderntess.  Her blood pressure has been historically difficult to control, she reports that it is high in the morning (usually 150-170s) but that it stabilizes after she takes her medications. She denies palpitations, pnd, orthopnea, n, v, dizziness, syncope, or weight gain.  Objective   Home Medications    Current Outpatient Medications  Medication Sig Dispense Refill   acetaminophen  (TYLENOL  8 HOUR ARTHRITIS PAIN) 650 MG CR tablet Take 650 mg by mouth every 8 (eight) hours as needed for pain.     albuterol  (PROVENTIL ) (2.5 MG/3ML) 0.083% nebulizer solution Take 3 mLs (2.5 mg total) by nebulization every 6 (six) hours as needed for wheezing or shortness of breath. 75 mL 12   albuterol  (VENTOLIN  HFA) 108 (90 Base) MCG/ACT inhaler Inhale 2 puffs into the lungs every 6 (six) hours as needed for wheezing or shortness of breath. 8 g 6   amLODipine  (NORVASC ) 5 MG tablet Take 1  tablet (5 mg total) by mouth daily. Take an extra tablet (5 mg) daily as needed for systolic blood pressure greater then 170. 180 tablet 3   brimonidine  (ALPHAGAN ) 0.2 % ophthalmic solution Place 1 drop into both eyes 2 (two) times daily. Place 1 drop into both eyes 2 (two) times daily.     carvedilol  (COREG ) 12.5 MG tablet Take 1 tablet (12.5 mg total) by mouth 2 (two) times daily with a meal. 180 tablet 2   cetirizine (ZYRTEC) 10 MG tablet Take 10 mg by mouth every morning.     Cholecalciferol (VITAMIN D3 PO) Take 1 tablet by mouth daily at 6 (six) AM.     cyclobenzaprine  (FLEXERIL )  10 MG tablet Take 10 mg by mouth 3 (three) times daily as needed for muscle spasms.     dorzolamide  (TRUSOPT ) 2 % ophthalmic solution Place 1 drop into both eyes 2 (two) times daily. Place 1 drop into both eyes 2 (two) times daily.     Ensifentrine  (OHTUVAYRE ) 3 MG/2.5ML SUSP Inhale 2.5 mLs into the lungs 2 (two) times daily.     estradiol  (ESTRACE ) 0.1 MG/GM vaginal cream Apply 0.5 gm vaginally with the applicator of tip of the finger nightly for 2 weeks and then 2-3x weekly. 42.5 g 12   famotidine  (PEPCID ) 20 MG tablet Take 1 tablet (20 mg total) by mouth 2 (two) times daily. 60 tablet 1   gabapentin (NEURONTIN) 300 MG capsule Take 300 mg by mouth at bedtime.     gentamicin ointment (GARAMYCIN) 0.1 % as needed (nose irritation).     guaifenesin  (MUCUS RELIEF) 400 MG TABS tablet Take 1,200 mg by mouth every morning.     hydrALAZINE  (APRESOLINE ) 50 MG tablet Take 0.5 tablets (25 mg total) by mouth daily as needed. 15 tablet 0   ipratropium (ATROVENT ) 0.06 % nasal spray Place 2 sprays into both nostrils 3 (three) times daily. 15 mL 12   levothyroxine  (SYNTHROID ) 75 MCG tablet Take 75 mcg by mouth daily before breakfast.     LORazepam  (ATIVAN ) 0.5 MG tablet Take 0.5 mg by mouth 2 (two) times daily as needed for anxiety (tremors).     losartan  (COZAAR ) 100 MG tablet TAKE ONE TABLET BY MOUTH ONCE DAILY. 90 tablet 3    nitrofurantoin , macrocrystal-monohydrate, (MACROBID ) 100 MG capsule Take 100 mg by mouth every 12 (twelve) hours.     pantoprazole  (PROTONIX ) 40 MG tablet Take 1 tablet (40 mg total) by mouth 2 (two) times daily. (Patient taking differently: Take 40 mg by mouth daily.) 180 tablet 3   Polyethyl Glycol-Propyl Glycol (SYSTANE OP) Apply 1 drop to eye as needed.     pravastatin  (PRAVACHOL ) 40 MG tablet Take 1 tablet (40 mg total) by mouth at bedtime.     Probiotic Product (PROBIOTIC PO) Take 1 tablet by mouth daily at 6 (six) AM.     rOPINIRole (REQUIP) 0.5 MG tablet Take 0.5 mg by mouth at bedtime.     sertraline  (ZOLOFT ) 100 MG tablet Take 2 tablets by mouth every morning.     sodium chloride  (OCEAN) 0.65 % nasal spray Place 2 sprays into both nostrils daily at 6 (six) AM. Use as needed     sodium chloride  HYPERTONIC 3 % nebulizer solution Take as 3 mL neb as needed for cough or chest congestion (Patient taking differently: as needed. Take as 3 mL neb as needed for cough or chest congestion) 750 mL 12   Tiotropium Bromide-Olodaterol (STIOLTO RESPIMAT ) 2.5-2.5 MCG/ACT AERS Inhale 2 puffs into the lungs daily.     traMADol  (ULTRAM ) 50 MG tablet Take 1 tablet (50 mg total) by mouth every 6 (six) hours as needed. 30 tablet 0   traZODone  (DESYREL ) 50 MG tablet Take 50 mg by mouth at bedtime.     triamcinolone  (NASACORT ) 55 MCG/ACT AERO nasal inhaler Place 1 spray into the nose at bedtime.     albuterol  (PROVENTIL ) (2.5 MG/3ML) 0.083% nebulizer solution Take 3 mLs (2.5 mg total) by nebulization every 6 (six) hours as needed for wheezing or shortness of breath. (Patient not taking: Reported on 09/09/2024) 360 mL 2   Multiple Vitamins-Minerals (MULTIVITAMIN WITH MINERALS) tablet Take 1 tablet by mouth 3 (three) times  a week. (Patient not taking: Reported on 09/09/2024)     Tiotropium Bromide-Olodaterol (STIOLTO RESPIMAT ) 2.5-2.5 MCG/ACT AERS Inhale 2 puffs into the lungs daily. (Patient not taking: Reported on  09/09/2024) 4 g 5   No current facility-administered medications for this visit.     Physical Exam    VS:  BP 110/60 (BP Location: Left Arm, Patient Position: Sitting, Cuff Size: Normal)   Pulse 86   Ht 5' 8 (1.727 m)   Wt 159 lb (72.1 kg)   SpO2 95%   BMI 24.18 kg/m  , BMI Body mass index is 24.18 kg/m.          GEN: Well nourished, well developed, in no acute distress. HEENT: normal. Neck: Supple, no JVD, carotid bruits, or masses. Cardiac: RRR, no murmurs, rubs, or gallops. No clubbing, cyanosis. Trace edema to left ankle w/ mild tenderness to deep palpation.  No erythema or cord. Radials 2+/PT 2+ and equal bilaterally.  Respiratory:  Respirations regular and unlabored, clear to auscultation bilaterally. GI: Soft, nontender, nondistended, BS + x 4. MS: no deformity or atrophy. Skin: warm and dry, no rash on exposed skin.  Neuro:  Strength and sensation are intact. Psych: Normal affect.  Accessory Clinical Findings      Lab Results  Component Value Date   WBC 5.0 07/31/2024   HGB 13.6 07/31/2024   HCT 40.7 07/31/2024   MCV 91.7 07/31/2024   PLT 202 07/31/2024   Lab Results  Component Value Date   CREATININE 0.88 07/31/2024   BUN 15 07/31/2024   NA 136 07/31/2024   K 4.2 07/31/2024   CL 101 07/31/2024   CO2 24 07/31/2024   Labs from [place] dated 08/06/2024  Total cholesterol 164, triglycerides 117, HDL 55, LDL 88 Hemoglobin J8r 5.5 TSH 1.63, free T4 1.34      Assessment & Plan    1.  DOE/Precordial pain:  chronic in the setting of bronchiectasis.  Followed by pulmonology.  She isn't sure if DOE is worse than prior, though she sometimes notes precordial chest tightness associated w/ dyspnea, occurring w/ relatively little activity.  She was recently seen by pulmonology w/ CXR showing chronic R hemidiaphragm and she is now pending SNIFF test.  She is very concerned about L ankle swelling, which has been persistent since @ least ED visit in 12/2023, at which  time LE U/S neg for DVT (D dimer neg 09/02/2024 as well when adjusted for age).  We will arrange for a 2D echo (EF prev 60-65% in 2020) to re-eval LV and RV fxn as well as PASP if measurable.  Following echo, will consider ischemic eval and potentially RHC.  2. Left leg swelling: Trace edema to left ankle that appears chronic. US  in January 2025 negative for DVT. D-Dimer 09/02/2024 0.64 (nl after adjusted for age). She is on amlodipine  5 mg daily, which she has been on for a long time. We discussed halving her dose to see if there is improvement to her swelling. She is hesitant to change her blood pressure medications and given that she doesn't have significant swelling will hold off on making any changes for now.  Strongly encouraged that she wear compression socks.  3. Epigastric pain/GERD: She continues to have significant GERD symptoms. Prev advised to increase Protonix  to 40 BID, but she's been taking daily and has plan to f/u w/ GI in coming wks.   4. Primary hypertension: BP 110/60. She reports that at home it is typically elevated in the  150s-170s and then normalizes after taking her medications. She remains on amlodipine , carvedilol , and losartan . Consider halving amlodipine  if continued ankle swelling and more bothersome to patient.   5. Hyperlipidemia: She remains on statin therapy. Last LDL 88 September 2025.  We did not discuss adjusting therapy today but will plan to address @ f/u given h/o Ao atherosclerosis.  6: Disposition: Pending echo. F/u in 6 weeks. F/u with GI at end of the month as planned.  Lonni Meager, NP 09/09/2024, 5:07 PM

## 2024-09-11 ENCOUNTER — Other Ambulatory Visit (HOSPITAL_COMMUNITY)

## 2024-09-14 ENCOUNTER — Ambulatory Visit (HOSPITAL_COMMUNITY)
Admission: RE | Admit: 2024-09-14 | Discharge: 2024-09-14 | Disposition: A | Discharge status: Home / Self Care | Source: Ambulatory Visit | Attending: Pulmonary Disease | Admitting: Pulmonary Disease

## 2024-09-14 DIAGNOSIS — J986 Disorders of diaphragm: Secondary | ICD-10-CM | POA: Insufficient documentation

## 2024-09-14 DIAGNOSIS — R0609 Other forms of dyspnea: Secondary | ICD-10-CM | POA: Insufficient documentation

## 2024-09-16 ENCOUNTER — Other Ambulatory Visit (HOSPITAL_BASED_OUTPATIENT_CLINIC_OR_DEPARTMENT_OTHER): Payer: Self-pay | Admitting: Pulmonary Disease

## 2024-09-16 MED ORDER — STIOLTO RESPIMAT 2.5-2.5 MCG/ACT IN AERS: 2.0000 | INHALATION_SPRAY | Freq: Every day | RESPIRATORY_TRACT | 11 refills | Status: AC

## 2024-09-16 NOTE — Telephone Encounter (Signed)
 Dr Neysa is the last prescriber on the rx of Stiolto - would you like me to refill this under your name ?

## 2024-09-16 NOTE — Telephone Encounter (Signed)
 Copied from CRM [id number]. Topic: Clinical - Medication Refill >> Sep 16, 2024  9:52 AM Rilla B wrote: Medication: Tiotropium Bromide-Olodaterol (STIOLTO RESPIMAT ) 2.5-2.5 MCG/ACT AERS  Has the patient contacted their pharmacy? Yes (Agent: If no, request that the patient contact the pharmacy for the refill. If patient does not wish to contact the pharmacy document the reason why and proceed with request.) (Agent: If yes, when and what did the pharmacy advise?)  This is the patient's preferred pharmacy:  [facility] (through [employer]) [phone]   Is this the correct pharmacy for this prescription? Yes If no, delete pharmacy and type the correct one.   Has the prescription been filled recently? No  Is the patient out of the medication? No  Has the patient been seen for an appointment in the last year OR does the patient have an upcoming appointment? Yes  Can we respond through MyChart? Yes  Agent: Please be advised that Rx refills may take up to 3 business days. We ask that you follow-up with your pharmacy.

## 2024-09-18 DIAGNOSIS — N390 Urinary tract infection, site not specified: Secondary | ICD-10-CM | POA: Diagnosis not present

## 2024-09-21 ENCOUNTER — Telehealth: Payer: Self-pay | Admitting: Pulmonary Disease

## 2024-09-21 ENCOUNTER — Telehealth: Payer: Self-pay | Admitting: *Deleted

## 2024-09-21 ENCOUNTER — Other Ambulatory Visit: Payer: Self-pay

## 2024-09-21 MED ORDER — STIOLTO RESPIMAT 2.5-2.5 MCG/ACT IN AERS: 2.0000 | INHALATION_SPRAY | Freq: Every day | RESPIRATORY_TRACT | Status: DC

## 2024-09-21 MED ORDER — TIOTROPIUM BROMIDE-OLODATEROL 2.5-2.5 MCG/ACT IN AERS: 2.0000 | INHALATION_SPRAY | Freq: Every day | RESPIRATORY_TRACT | 4 refills | Status: DC

## 2024-09-21 NOTE — Telephone Encounter (Signed)
 Signed in error, will create new telephone encounter.

## 2024-09-21 NOTE — Telephone Encounter (Signed)
 Copied from CRM #[id number]. Topic: Clinical - Medication Prior Auth >> Sep 18, 2024  2:04 PM [name] wrote: Reason for CRM: Patient Assistance is calling to inquire about status of  Tiotropium Bromide-Olodaterol (STIOLTO RESPIMAT ) 2.5-2.5 MCG/ACT AERS. Informed that because rx appears to have been sent 10/15, chances are likely that team is still working to finish or submit related application. Please update patient.  I called and spoke with patient, advised the above.  She verbalized understanding.  She stated that when her last prescription was sent in to patient assistance, it was only sent in for 2 refills so she is on her last inhaler.  I advised her I would get a new script to them, I would have a NP here sign the script and then fax it to patient assistance.  She said her daughters were going to go by the office to try to get this all straightened out because a new prescription was sent to her pharmacy and not patient assistance.  I told her to let her daughters know I will take care of it for her.  She verbalized understanding and was appreciative.  Script printed for [name] NP sign the script.

## 2024-09-21 NOTE — Telephone Encounter (Signed)
 Was meant for [facility],already sent to them.NFN

## 2024-09-21 NOTE — Telephone Encounter (Signed)
 Patients daughter came in office needing to speak to clinical staff about patient assistant program and her mothers medication. Patients daughter states that she needs this medication. Please advise and give patient a call back asap, thanks.

## 2024-09-21 NOTE — Telephone Encounter (Signed)
 Prescription for Stiolto signed by [name] NP and faxed to [facility] [phone].  Received confirmation fax that it was sent successfully.  Nothing further needed.

## 2024-09-23 NOTE — Telephone Encounter (Signed)
 Script faxed to [facility], received confirmation fax that it was sent successfully.  Nothing further needed.

## 2024-09-28 ENCOUNTER — Encounter: Payer: Self-pay | Admitting: Gastroenterology

## 2024-09-28 ENCOUNTER — Ambulatory Visit: Payer: Self-pay

## 2024-09-28 ENCOUNTER — Ambulatory Visit: Payer: Self-pay | Admitting: Pulmonary Disease

## 2024-09-28 ENCOUNTER — Ambulatory Visit: Admitting: Gastroenterology

## 2024-09-28 VITALS — BP 116/68 | HR 66 | Ht 68.0 in | Wt 162.0 lb

## 2024-09-28 DIAGNOSIS — R058 Other specified cough: Secondary | ICD-10-CM

## 2024-09-28 DIAGNOSIS — K4090 Unilateral inguinal hernia, without obstruction or gangrene, not specified as recurrent: Secondary | ICD-10-CM

## 2024-09-28 DIAGNOSIS — K5900 Constipation, unspecified: Secondary | ICD-10-CM

## 2024-09-28 DIAGNOSIS — R0600 Dyspnea, unspecified: Secondary | ICD-10-CM

## 2024-09-28 DIAGNOSIS — K219 Gastro-esophageal reflux disease without esophagitis: Secondary | ICD-10-CM

## 2024-09-28 DIAGNOSIS — R1030 Lower abdominal pain, unspecified: Secondary | ICD-10-CM

## 2024-09-28 DIAGNOSIS — R490 Dysphonia: Secondary | ICD-10-CM

## 2024-09-28 DIAGNOSIS — R1310 Dysphagia, unspecified: Secondary | ICD-10-CM

## 2024-09-28 NOTE — Progress Notes (Unsigned)
 MAIMOUNA RONDEAU [id number] 12-May-1935   Chief Complaint:  Referring Provider: Shona Norleen PEDLAR, MD Primary GI MD: Dr. Avram  HPI: RONNISHA FELBER is a 89 y.o. female with past medical history of migraines, hypertension, CHF, GERD, IBS, hypothyroidism, essential tremor, OA, HLD, anxiety, depression, glaucoma, bronchiectasis, COPD and lung nodules without history of smoking who presents today for a complaint of *** .    Last seen in office 06/20/2018 by Dr. Avram.  Had been found to have an abnormality of the pylorus on her recent CT scan.  Patient had elected treatment with PPI prior to endoscopic evaluation.  Epigastric pain improved but she continued to have reflux and choking.  Also noted to have fecal incontinence and diarrhea, with history of weak anal sphincter. Plan at that time was to use loperamide  for diarrhea.  EGD recommended to evaluate pylorus abnormality and exclude malignancy.  Underwent EGD 07/29/2018 with erythematous/edematous mucosa in the prepyloric region of the stomach which was biopsied.  Pathology was benign.  Seen by PCP 07/22/2024 at which time she reported high symptom burden of COPD with chronic cough and sputum production, shortness of breath.  Also endorsed vocal hoarseness which was thought likely related to GERD, present for several months.  Prior laryngoscopy unremarkable.  She was on Protonix  twice daily at that time without improvement in hoarseness, referred to GI for further management of reflux, and consideration for repeat ENT referral if hoarseness persists despite reflux management.  Famotidine  was also added on for breakthrough symptoms.  Seen in the ED 07/31/2024 for lower abdominal pain and urinary hesitancy.  Was on Macrobid  at that time.  CT abdomen and pelvis showed right inguinal hernia containing distal ileal loops with fecalization, no bowel obstruction, moderate to large stool burden, pulmonary nodule, no acute findings.  No evidence of incarcerated  hernia.  Advised close follow-up with PCP and given contact information for general surgery.  Labs 07/31/2024: Normal CBC, unremarkable BMP   GERD Abdominal pain Constipation   Taking Protonix  once or twice a day? Taking Pepcid  once or twice a day? Taking anything for constipation?  Seen by surgery?  On metamucil and miralax which has helped some Feels food in throat Gets choked while eating  Currently on Macrobid  for a UTI   Discussed the use of AI scribe software for clinical note transcription with the patient, who gave verbal consent to proceed.  History of Present Illness BAILYNN DYK is an 89 year old female who presents with swallowing difficulties and abdominal pain.  She experiences significant difficulty with swallowing, describing a sensation of choking and food getting stuck in her throat. After eating, she often starts coughing, which she attributes to reflux. This has been a persistent issue for a considerable time, although she cannot specify the exact duration. She is currently taking Protonix , sometimes twice a day, and occasionally uses Pepcid  as needed. An upper endoscopy in 2019 showed no significant abnormalities.  She reports chronic constipation and lower abdominal pain. Her stool is hard and sometimes in 'little curls,' and she experiences irregular bowel movements despite taking Metamucil and Miralax. She takes one capful of Miralax daily, which sometimes results in daily bowel movements but not consistently. She reports that when she went to the emergency room two months ago for severe abdominal pain, she was told there was a lot of stool piled up against her hernia. She experiences pain when straining, which exacerbates her hernia discomfort. Occasional blood in her stool is noted, and  she has a history of IBS, which has improved over time.  She has a history of urinary tract infections, with recent treatment involving antibiotics such as Cipro  and another  unspecified medication after a culture. She has been experiencing recurrent UTIs with E. Coli.  She has a history of COPD and reports a productive cough with mucus occasionally. She is on medications for her lungs, including albuterol , and has noticed her voice becoming hoarse over the past year or two. She experiences significant back pain due to scoliosis and has difficulty standing for long periods.  She lives alone and struggles with preparing meals due to her pain. She reports feeling cold frequently, with chills and a low body temperature around 97.55F. No recent weight loss, but she admits to not eating well due to living alone and experiencing significant pain.      Previous GI Procedures/Imaging   CT A/P 07/31/2024 1. No acute inflammatory process identified within the abdomen or pelvis. 2. There is a moderate sized right inguinal hernia containing distal ileal loops, which are fecalized. Findings are nonspecific and may be due to constipation versus incompetent ileocecal valve. No bowel obstruction. Moderate-to-large stool burden. 3. There is a 5 x 8 mm subpleural nodule in the left lung lower lobe, which is new since the prior study from 08/31/2021. Correlate clinically to determine the need for repeat examination in 3-6 months to document stability. 4. Multiple other nonacute observations, as described above.  EGD 07/29/2018 - Erythematous/ edematous mucosa in the prepyloric region of the stomach. Biopsied since this was area that was abnormal on CT. probably gastritis but will r/ o neoplasia  - The examination was otherwise normal. Path: Surgical [P], pre pyloric antrum - BENIGN GASTRIC TYPE MUCOSA. - THERE IS NO EVIDENCE OF MALIGNANCY.  Colonoscopy 08/12/2012 - Two 4 to 6 mm sessile polyps found in transverse colon and rectum, polypectomy performed with cold snare - Mild diverticulosis noted in the sigmoid colon - Colon mucosa otherwise normal with excellent prep -  History of diminutive adenoma removal in 2008 - Decreased anal sphincter tone, voluntary and rest - Perianal dermatitis - Recall not recommended Path: Surgical [P], transverse, sigmoid, polyps - TUBULAR ADENOMA (TWO FRAGMENTS). NO HIGH GRADE DYSPLASIA OR MALIGNANCY IDENTIFIED.  Past Medical History:  Diagnosis Date   Adenomatous polyp of colon 03/2007   Allergic rhinitis    Anxiety    a.) on BZO (lorazepam ) PRN   Arthritis    Blood transfusion 1974   Bronchiectasis (HCC)    Cancer (HCC)    left arm   Deaf    in right 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 Mild TR; b.) TTE 07/16/2019: EF 60-65%, triv AR, mild MR/TR, G1DD.   Diet-controlled type 2 diabetes mellitus (HCC)    Diverticulosis of colon (without mention of hemorrhage)    DOE (dyspnea on exertion)    Dyspnea    Fatigue    Fecal incontinence    GERD (gastroesophageal reflux disease)    Glaucoma    Goiter    right side   Heart murmur    History of stress test    a. 01/2014 Lexi MV: no ischemia/infarct.   Hyperkalemia    Hyperlipidemia    Hypertension    a. 11/2018 Renal artery duplex: No RAS.    Hypothyroidism    IBS (irritable bowel syndrome)  Migraine    Mitral valve prolapse    a. 01/2014 Echo: no MR/MVP noted.   Neck mass    right side/ no airway problems/ per pt has had since 2005 (goiter)   Nonspecific abnormal electrocardiogram (ECG) (EKG)    Palpitations    a. 01/2014 CardioNet: wore for 9 days (rash). Sinus tach - 100's. No SVT/AF.   Pneumonia    Polyp, sigmoid colon    Post-operative nausea and vomiting    Pulmonary fibrosis (HCC)    Pulmonary nodules    Recurrent UTI    Tachycardia    Tremor     Past Surgical History:  Procedure Laterality Date   ABDOMINAL HYSTERECTOMY     CARDIOVASCULAR STRESS TEST  09/19/2011   No scintigraphic evidence of inducible myocardial ischemia. Pharmacological  stress test without chest pain or EKG changes for ischemia.   COLONOSCOPY     ESOPHAGOGASTRODUODENOSCOPY     MASTOIDECTOMY     rt ear/ as a teenager   OOPHORECTOMY     SKIN CANCER EXCISION Left    arm   TOTAL HIP ARTHROPLASTY  5/10   left   TOTAL HIP ARTHROPLASTY Right 07/10/2022   Procedure: TOTAL HIP ARTHROPLASTY ANTERIOR APPROACH;  Surgeon: Kathlynn Sharper, MD;  Location: [facility] ORS;  Service: Orthopedics;  Laterality: Right;   TRANSTHORACIC ECHOCARDIOGRAM  09/19/2011   EF >55%, stage 1 diastolic dysfunction, mild tricuspid valve regurg    Current Outpatient Medications  Medication Sig Dispense Refill   acetaminophen  (TYLENOL  8 HOUR ARTHRITIS PAIN) 650 MG CR tablet Take 650 mg by mouth every 8 (eight) hours as needed for pain.     albuterol  (PROVENTIL ) (2.5 MG/3ML) 0.083% nebulizer solution Take 3 mLs (2.5 mg total) by nebulization every 6 (six) hours as needed for wheezing or shortness of breath. 360 mL 2   albuterol  (PROVENTIL ) (2.5 MG/3ML) 0.083% nebulizer solution Take 3 mLs (2.5 mg total) by nebulization every 6 (six) hours as needed for wheezing or shortness of breath. 75 mL 12   albuterol  (VENTOLIN  HFA) 108 (90 Base) MCG/ACT inhaler Inhale 2 puffs into the lungs every 6 (six) hours as needed for wheezing or shortness of breath. 8 g 6   amLODipine  (NORVASC ) 5 MG tablet Take 1 tablet (5 mg total) by mouth daily. Take an extra tablet (5 mg) daily as needed for systolic blood pressure greater then 170. 180 tablet 3   brimonidine  (ALPHAGAN ) 0.2 % ophthalmic solution Place 1 drop into both eyes 2 (two) times daily. Place 1 drop into both eyes 2 (two) times daily.     carvedilol  (COREG ) 12.5 MG tablet Take 1 tablet (12.5 mg total) by mouth 2 (two) times daily with a meal. 180 tablet 2   cetirizine (ZYRTEC) 10 MG tablet Take 10 mg by mouth every morning.     Cholecalciferol (VITAMIN D3 PO) Take 1 tablet by mouth daily at 6 (six) AM.     cyclobenzaprine  (FLEXERIL ) 10 MG tablet Take 10 mg by  mouth 3 (three) times daily as needed for muscle spasms.     dorzolamide  (TRUSOPT ) 2 % ophthalmic solution Place 1 drop into both eyes 2 (two) times daily. Place 1 drop into both eyes 2 (two) times daily.     estradiol  (ESTRACE ) 0.1 MG/GM vaginal cream Apply 0.5 gm vaginally with the applicator of tip of the finger nightly for 2 weeks and then 2-3x weekly. 42.5 g 12   famotidine  (PEPCID ) 20 MG tablet Take 1 tablet (20 mg total) by mouth  2 (two) times daily. 60 tablet 1   gabapentin (NEURONTIN) 300 MG capsule Take 300 mg by mouth at bedtime.     gentamicin ointment (GARAMYCIN) 0.1 % as needed (nose irritation).     guaifenesin  (MUCUS RELIEF) 400 MG TABS tablet Take 1,200 mg by mouth every morning.     hydrALAZINE  (APRESOLINE ) 50 MG tablet Take 0.5 tablets (25 mg total) by mouth daily as needed. 15 tablet 0   ipratropium (ATROVENT ) 0.06 % nasal spray Place 2 sprays into both nostrils 3 (three) times daily. 15 mL 12   levothyroxine  (SYNTHROID ) 75 MCG tablet Take 75 mcg by mouth daily before breakfast.     LORazepam  (ATIVAN ) 0.5 MG tablet Take 0.5 mg by mouth 2 (two) times daily as needed for anxiety (tremors).     losartan  (COZAAR ) 100 MG tablet TAKE ONE TABLET BY MOUTH ONCE DAILY. 90 tablet 3   Multiple Vitamins-Minerals (MULTIVITAMIN WITH MINERALS) tablet Take 1 tablet by mouth 3 (three) times a week.     nitrofurantoin , macrocrystal-monohydrate, (MACROBID ) 100 MG capsule Take 100 mg by mouth every 12 (twelve) hours.     pantoprazole  (PROTONIX ) 40 MG tablet Take 1 tablet (40 mg total) by mouth 2 (two) times daily. 180 tablet 3   Polyethyl Glycol-Propyl Glycol (SYSTANE OP) Apply 1 drop to eye as needed.     pravastatin  (PRAVACHOL ) 40 MG tablet Take 1 tablet (40 mg total) by mouth at bedtime.     Probiotic Product (PROBIOTIC PO) Take 1 tablet by mouth daily at 6 (six) AM.     rOPINIRole (REQUIP) 0.5 MG tablet Take 0.5 mg by mouth at bedtime.     sertraline  (ZOLOFT ) 100 MG tablet Take 2 tablets by  mouth every morning.     sodium chloride  (OCEAN) 0.65 % nasal spray Place 2 sprays into both nostrils daily at 6 (six) AM. Use as needed     sodium chloride  HYPERTONIC 3 % nebulizer solution Take as 3 mL neb as needed for cough or chest congestion (Patient taking differently: as needed. Take as 3 mL neb as needed for cough or chest congestion) 750 mL 12   Tiotropium Bromide-Olodaterol (STIOLTO RESPIMAT ) 2.5-2.5 MCG/ACT AERS Inhale 2 puffs into the lungs daily. 4 g 5   Tiotropium Bromide-Olodaterol (STIOLTO RESPIMAT ) 2.5-2.5 MCG/ACT AERS Inhale 2 puffs into the lungs daily. 4 g 11   Tiotropium Bromide-Olodaterol (STIOLTO RESPIMAT ) 2.5-2.5 MCG/ACT AERS Inhale 2 puffs into the lungs daily.     traMADol  (ULTRAM ) 50 MG tablet Take 1 tablet (50 mg total) by mouth every 6 (six) hours as needed. 30 tablet 0   traZODone  (DESYREL ) 50 MG tablet Take 50 mg by mouth at bedtime.     triamcinolone  (NASACORT ) 55 MCG/ACT AERO nasal inhaler Place 1 spray into the nose at bedtime.     Ensifentrine  (OHTUVAYRE ) 3 MG/2.5ML SUSP Inhale 2.5 mLs into the lungs 2 (two) times daily. (Patient not taking: Reported on 09/28/2024)     Tiotropium Bromide-Olodaterol 2.5-2.5 MCG/ACT AERS Inhale 2 puffs into the lungs daily. (Patient not taking: Reported on 09/28/2024) 3 each 4   No current facility-administered medications for this visit.    Allergies as of 09/28/2024 - Review Complete 09/28/2024  Allergen Reaction Noted   Prednisone  03/17/2009   Tramadol  Itching 06/29/2022   Adhesive [tape]  07/15/2018   Penicillins  01/07/2007   Sulfonamide derivatives  01/07/2007    Family History  Problem Relation Age of Onset   Diabetes Father    Throat cancer  Mother    Coronary artery disease Mother    Prostate cancer Brother    Asthma Daughter    Scoliosis Daughter    Colon cancer Paternal Grandfather    Esophageal cancer Neg Hx    Rectal cancer Neg Hx    Stomach cancer Neg Hx     Social History   Tobacco Use    Smoking status: Never    Passive exposure: Past   Smokeless tobacco: Never  Vaping Use   Vaping status: Never Used  Substance Use Topics   Alcohol  use: Yes    Comment: rare   Drug use: No     Review of Systems:    Constitutional: No weight loss, fever, chills, weakness or fatigue Eyes: No change in vision Ears, Nose, Throat:  No change in hearing or congestion Skin: No rash or itching Cardiovascular: No chest pain, chest pressure or palpitations   Respiratory: No SOB or cough Gastrointestinal: See HPI and otherwise negative Genitourinary: No dysuria or change in urinary frequency Neurological: No headache, dizziness or syncope Musculoskeletal: No new muscle or joint pain Hematologic: No bleeding or bruising    Physical Exam:  Vital signs: BP 116/68   Pulse 66   Ht 5' 8 (1.727 m)   Wt 162 lb (73.5 kg)   SpO2 97%   BMI 24.63 kg/m   Constitutional: NAD, Well developed, Well nourished, alert and cooperative Head:  Normocephalic and atraumatic.  Eyes: No scleral icterus. Conjunctiva pink. Mouth: No oral lesions. Respiratory: Respirations even and unlabored. Lungs clear to auscultation bilaterally.  No wheezes, crackles, or rhonchi.  Cardiovascular:  Regular rate and rhythm. No murmurs. No peripheral edema. Gastrointestinal:  Soft, nondistended, nontender. No rebound or guarding. Normal bowel sounds. No appreciable masses or hepatomegaly. Rectal:  Not performed.  Neurologic:  Alert and oriented x4;  grossly normal neurologically.  Skin:   Dry and intact without significant lesions or rashes. Psychiatric: Oriented to person, place and time. Demonstrates good judgement and reason without abnormal affect or behaviors.   RELEVANT LABS AND IMAGING: CBC    Component Value Date/Time   WBC 5.0 07/31/2024 0820   RBC 4.44 07/31/2024 0820   HGB 13.6 07/31/2024 0820   HGB CANCELED 11/04/2018 1114   HCT 40.7 07/31/2024 0820   HCT CANCELED 11/04/2018 1114   PLT 202 07/31/2024  0820   PLT CANCELED 11/04/2018 1114   MCV 91.7 07/31/2024 0820   MCH 30.6 07/31/2024 0820   MCHC 33.4 07/31/2024 0820   RDW 12.5 07/31/2024 0820   LYMPHSABS 0.9 07/31/2024 0820   LYMPHSABS CANCELED 11/04/2018 1114   MONOABS 0.4 07/31/2024 0820   EOSABS 0.2 07/31/2024 0820   EOSABS CANCELED 11/04/2018 1114   BASOSABS 0.1 07/31/2024 0820   BASOSABS CANCELED 11/04/2018 1114    CMP     Component Value Date/Time   NA 136 07/31/2024 0820   NA 137 04/08/2024 1503   K 4.2 07/31/2024 0820   CL 101 07/31/2024 0820   CO2 24 07/31/2024 0820   GLUCOSE 127 (H) 07/31/2024 0820   BUN 15 07/31/2024 0820   BUN 13 04/08/2024 1503   CREATININE 0.88 07/31/2024 0820   CREATININE 1.01 04/12/2014 1419   CALCIUM 9.1 07/31/2024 0820   PROT 7.6 03/20/2018 2041   ALBUMIN 4.2 03/20/2018 2041   AST 24 03/20/2018 2041   ALT 17 03/20/2018 2041   ALKPHOS 55 03/20/2018 2041   BILITOT 1.3 (H) 03/20/2018 2041   GFRNONAA >60 07/31/2024 0820   GFRAA >60 11/06/2018  9245   Echocardiogram 07/16/2019 1. The left ventricle has normal systolic function with an ejection fraction of 60- 65% . The cavity size was normal. There is mildly increased left ventricular wall thickness. Left ventricular diastolic Doppler parameters are consistent with impaired relaxation.  2. The right ventricle has normal systolic function. The cavity was normal. There is no increase in right ventricular wall thickness. Right ventricular systolic pressure is normal with an estimated pressure of 25. 3 mmHg.  Assessment/Plan:    Continue Protonix  40 mg once a day Famotidine  40 mg nightly Lifestyle mods Barium swallow Miralax 1 capful BID Follow up ENT for vocal hoarseness Follow up with pulmonology Discuss CT findings with Dr. Avram Camie Furbish, PA-C [facility] 09/28/2024, 2:52 PM  Patient Care Team: Shona Norleen PEDLAR, MD as PCP - General (Internal Medicine) Perla Evalene PARAS, MD as PCP - Cardiology  (Cardiology) Perla Evalene PARAS, MD as Consulting Physician (Cardiology) Catherine Cools, MD as Consulting Physician (Pulmonary Disease)

## 2024-09-28 NOTE — Patient Instructions (Addendum)
 Continue Protonix  40 mg once daily.   Take Pepcid  20 mg  at bedtime.   Follow-up with ENT.  Keep follow-up appointment with Pulmonary.   Please purchase the following medications over the counter and take as directed: Miralax - 1 capful twice daily dissolved in at least 8 ounces of water/juice.   You have been scheduled for a Barium Esophogram at [facility] (1st floor of the hospital) on 10/05/24 at 10:30 am . Please arrive 15 minutes prior to your appointment for registration. Make certain not to have anything to eat or drink 3 hours prior to your test. If you need to reschedule for any reason, please contact radiology at [phone] to do so. ____________________________________________________________ A barium swallow is an examination that concentrates on views of the esophagus. This tends to be a double contrast exam (barium and two liquids which, when combined, create a gas to distend the wall of the oesophagus) or single contrast (non-ionic iodine based). The study is usually tailored to your symptoms so a good history is essential. Attention is paid during the study to the form, structure and configuration of the esophagus, looking for functional disorders (such as aspiration, dysphagia, achalasia, motility and reflux) EXAMINATION You may be asked to change into a gown, depending on the type of swallow being performed. A radiologist and radiographer will perform the procedure. The radiologist will advise you of the type of contrast selected for your procedure and direct you during the exam. You will be asked to stand, sit or lie in several different positions and to hold a small amount of fluid in your mouth before being asked to swallow while the imaging is performed .In some instances you may be asked to swallow barium coated marshmallows to assess the motility of a solid food bolus. The exam can be recorded as a digital or video fluoroscopy procedure. POST PROCEDURE It will  take 1-2 days for the barium to pass through your system. To facilitate this, it is important, unless otherwise directed, to increase your fluids for the next 24-48hrs and to resume your normal diet.  This test typically takes about 30 minutes to perform. __________________________________________________________    Follow-up with Dr Avram on: 11/02/24 at 1:50 pm with Dr Avram  Due to recent changes in healthcare laws, you may see the results of your imaging and laboratory studies on MyChart before your provider has had a chance to review them.  We understand that in some cases there may be results that are confusing or concerning to you. Not all laboratory results come back in the same time frame and the provider may be waiting for multiple results in order to interpret others.  Please give us  48 hours in order for your provider to thoroughly review all the results before contacting the office for clarification of your results.   Thank you for choosing me and [facility].

## 2024-09-28 NOTE — Telephone Encounter (Signed)
 Patient states that she wants to go to the [place] office and prefers to see Dr Paula Alghanim or Comer Rouleau NP No appointments available in the [place] office and patient is advised Urgent Care but states she will see if she gets any worse but she would like to see pulmonology Patient would like a call back from the pulmonology office    FYI Only or Action Required?: Action required by provider: request for appointment.  Patient is followed in Pulmonology for bronchiectasis, last seen on 09/02/2024 by Catherine Paula, MD.  Called Nurse Triage reporting Cough.  Symptoms began a week ago.  Interventions attempted: OTC medications: cough syrup, mucinex , Prescription medications: atrovent  nasal spray, Maintenance inhaler, and Nebulizer treatments.  Symptoms are: gradually worsening.  Triage Disposition: See Physician Within 24 Hours  Patient/caregiver understands and will follow disposition?: No, wishes to speak with PCP           Copied from CRM #[id number]. Topic: Clinical - Red Word Triage >> Sep 28, 2024 10:16 AM Essie A wrote: Red Word that prompted transfer to Nurse Triage: Patient is calling to schedule an appointment with Dr. Catherine but she has been wheezing for about a week and has a nagging cough which produces a yellow mucus sometimes but not all. Reason for Disposition  [1] Known COPD or other severe lung disease (i.e., bronchiectasis, cystic fibrosis, lung surgery) AND [2] symptoms getting worse (i.e., increased sputum purulence or amount, increased breathing difficulty  Answer Assessment - Initial Assessment Questions Patient states that she wants to go to the [place] office and prefers to see Dr Paula Alghanim or Comer Rouleau NP She is advised that it is recommended she be seen and evaluated today but she states she cannot do that today due to a G.I. appointment she already has scheduled There are no appointments at the [facility] office anytime soon  and she states she would prefer to go there She is aware that Urgent Care is an option as well & states she will utilize that if possible  Patient is advised to call us  back if anything changes or with any further questions/concerns. Patient is advised that if anything worsens to go to the Emergency Room. Patient verbalized understanding.    [facility] Pulmonary Triage - Initial Assessment Questions Chief Complaint (e.g., cough, sob, wheezing, fever, chills, sweat or additional symptoms) *Go to specific symptom protocol after initial questions. Cough with yellow mucous, wheezing, mild shortness of breath worse than normal  How long have symptoms been present? A week ago  Have you tested for COVID or Flu? Note: If not, ask patient if a home test can be taken. If so, instruct patient to call back for positive results. No  MEDICINES:   Have you used any OTC meds to help with symptoms? Yes If yes, ask What medications? Cough syrup Mucinex   Have you used your inhalers/maintenance medication? Yes If yes, What medications? Albuterol  Nebulizer--Take 3 mLs (2.5 mg total) by nebulization every 6 (six) hours as needed for wheezing or shortness of breath.  Atrovent  Nasal Spray-- Place 2 sprays into both nostrils 3 (three) times daily  Stiolto Respimat -- Inhale 2 puffs into the lungs daily.   If inhaler, ask How many puffs and how often? Note: Review instructions on medication in the chart. Albuterol  Nebulizer--Take 3 mLs (2.5 mg total) by nebulization every 6 (six) hours as needed for wheezing or shortness of breath.  Atrovent  Nasal Spray-- Place 2 sprays into both nostrils 3 (three) times daily  Stiolto  Respimat-- Inhale 2 puffs into the lungs daily.   OXYGEN: Do you wear supplemental oxygen? No If yes, How many liters are you supposed to use? N/a  Do you monitor your oxygen levels? Yes If yes, What is your reading (oxygen level) today? 94-97% per patient is her usual  numbers and today she hasn't checked it ---she tried but states her fingers were too cold and it wasn't picking up at that time    3. SPUTUM: Describe the color of your sputum (e.g., none, dry cough; clear, white, yellow, green)     yellow 4. HEMOPTYSIS: Are you coughing up any blood? If Yes, ask: How much? (e.g., flecks, streaks, tablespoons, etc.)     denies 5. DIFFICULTY BREATHING: Are you having difficulty breathing? If Yes, ask: How bad is it? (e.g., mild, moderate, severe)      yes 6. FEVER: Do you have a fever? If Yes, ask: What is your temperature, how was it measured, and when did it start?     denies 7. CARDIAC HISTORY: Do you have any history of heart disease? (e.g., heart attack, congestive heart failure)      ---- 8. LUNG HISTORY: Do you have any history of lung disease?  (e.g., pulmonary embolus, asthma, emphysema)     bronchiectasis 9. PE RISK FACTORS: Do you have a history of blood clots? (or: recent major surgery, recent prolonged travel, bedridden)     ---  Protocols used: Cough - Acute Productive-A-AH

## 2024-09-28 NOTE — Telephone Encounter (Signed)
 FYI Only or Action Required?: Action required by provider: request for appointment.: appt made: however if sooner appt available to pulmonary provider, please call  Patient is followed in Pulmonology for Bronchiectasis , last seen on 09/02/2024 by Catherine Cools, MD.  Called Nurse Triage reporting Advice Only.   Triage Disposition: Call PCP When Office is Open: scheduled appt for pt within 24-48 hours w/pulm provider  Patient/caregiver understands and will follow disposition?: Yes   Copied from CRM [id number]. Topic: Clinical - Red Word Triage >> Sep 28, 2024 11:13 AM Celestine FALCON wrote: Red Word that prompted transfer to Nurse Triage: Pt was triaged this morning with RN Ronal . Patient is calling to schedule an appointment with Dr. Catherine but she has been wheezing for about a week and has a nagging cough which produces a yellow mucus sometimes but not all.  Pt was offered an appt in [place] that she declined, but is willing to take the appt tomorrow in [place]. Reason for Disposition  [1] Caller requesting NON-URGENT health information AND [2] PCP's office is the best resource    Schedule appt with pulm w/n 24-48 hours: appt scheduled  Answer Assessment - Initial Assessment Questions 1. REASON FOR CALL: What is the main reason for your call? or How can I best help you?   Pt called back to schedule appt in [facility] for tomorrow: open not longer available: attempted to have pt check O2: pt unable  to check due to hand s and feet cold all the time: pt able to carry on conversation without having to stop to breath, no wheezing heard, no signs of distress.  Pt uses nebulizer but does not use rescue inhaler do to uncertain of how to use.  Education given to patient regarding the use of rescue inhaler and nebulizer treatments. Nurse schedule pt appt for this coming up Wednesday and informed pt is s/s increased/worsening call back or go immediately to ER: pt verbalized  understanding.  Protocols used: Information Only Call - No Triage-A-AH

## 2024-09-29 ENCOUNTER — Encounter: Payer: Self-pay | Admitting: Gastroenterology

## 2024-09-29 NOTE — Telephone Encounter (Signed)
 Pt needs appt with dr Alghanim for acute   Patient is calling to schedule an appointment with Dr. Catherine but she has been wheezing for about a week and has a nagging cough which produces a yellow mucus sometimes but not all.

## 2024-09-29 NOTE — Telephone Encounter (Signed)
 DISREGARD LAST PHONE NOTE ABOUT APPT. WITH DR ALGHANIMS - PT ALREADY SCHEDULED WITH [facility]

## 2024-09-30 ENCOUNTER — Ambulatory Visit (INDEPENDENT_AMBULATORY_CARE_PROVIDER_SITE_OTHER): Admitting: Nurse Practitioner

## 2024-09-30 ENCOUNTER — Other Ambulatory Visit
Admission: RE | Admit: 2024-09-30 | Discharge: 2024-09-30 | Disposition: A | Discharge status: Home / Self Care | Source: Ambulatory Visit | Attending: Nurse Practitioner | Admitting: Nurse Practitioner

## 2024-09-30 ENCOUNTER — Ambulatory Visit: Payer: Self-pay | Admitting: Nurse Practitioner

## 2024-09-30 ENCOUNTER — Encounter: Payer: Self-pay | Admitting: Nurse Practitioner

## 2024-09-30 VITALS — BP 120/60 | HR 65 | Temp 97.9°F | Ht 68.0 in | Wt 162.0 lb

## 2024-09-30 DIAGNOSIS — R0609 Other forms of dyspnea: Secondary | ICD-10-CM

## 2024-09-30 DIAGNOSIS — D649 Anemia, unspecified: Secondary | ICD-10-CM

## 2024-09-30 DIAGNOSIS — R600 Localized edema: Secondary | ICD-10-CM | POA: Diagnosis not present

## 2024-09-30 DIAGNOSIS — J471 Bronchiectasis with (acute) exacerbation: Secondary | ICD-10-CM | POA: Diagnosis not present

## 2024-09-30 DIAGNOSIS — J441 Chronic obstructive pulmonary disease with (acute) exacerbation: Secondary | ICD-10-CM | POA: Diagnosis not present

## 2024-09-30 DIAGNOSIS — E875 Hyperkalemia: Secondary | ICD-10-CM

## 2024-09-30 LAB — CBC WITH DIFFERENTIAL/PLATELET
Abs Immature Granulocytes: 0.02 K/uL (ref 0.00–0.07)
Basophils Absolute: 0.1 K/uL (ref 0.0–0.1)
Basophils Relative: 2 %
Eosinophils Absolute: 0.3 K/uL (ref 0.0–0.5)
Eosinophils Relative: 6 %
HCT: 39.5 % (ref 36.0–46.0)
Hemoglobin: 13.3 g/dL (ref 12.0–15.0)
Immature Granulocytes: 0 %
Lymphocytes Relative: 21 %
Lymphs Abs: 1.2 K/uL (ref 0.7–4.0)
MCH: 30.1 pg (ref 26.0–34.0)
MCHC: 33.7 g/dL (ref 30.0–36.0)
MCV: 89.4 fL (ref 80.0–100.0)
Monocytes Absolute: 0.5 K/uL (ref 0.1–1.0)
Monocytes Relative: 8 %
Neutro Abs: 3.7 K/uL (ref 1.7–7.7)
Neutrophils Relative %: 63 %
Platelets: 213 K/uL (ref 150–400)
RBC: 4.42 MIL/uL (ref 3.87–5.11)
RDW: 12.9 % (ref 11.5–15.5)
WBC: 5.8 K/uL (ref 4.0–10.5)
nRBC: 0 % (ref 0.0–0.2)

## 2024-09-30 LAB — BRAIN NATRIURETIC PEPTIDE: B Natriuretic Peptide: 106.4 pg/mL — ABNORMAL HIGH (ref 0.0–100.0)

## 2024-09-30 LAB — BASIC METABOLIC PANEL WITH GFR
Anion gap: 7 (ref 5–15)
BUN: 19 mg/dL (ref 8–23)
CO2: 24 mmol/L (ref 22–32)
Calcium: 9.4 mg/dL (ref 8.9–10.3)
Chloride: 98 mmol/L (ref 98–111)
Creatinine, Ser: 1.32 mg/dL — ABNORMAL HIGH (ref 0.44–1.00)
GFR, Estimated: 39 mL/min — ABNORMAL LOW (ref >60)
Glucose, Bld: 115 mg/dL — ABNORMAL HIGH (ref 70–99)
Potassium: 5.8 mmol/L — ABNORMAL HIGH (ref 3.5–5.1)
Sodium: 129 mmol/L — ABNORMAL LOW (ref 135–145)

## 2024-09-30 MED ORDER — METHYLPREDNISOLONE 4 MG PO TBPK: ORAL_TABLET | ORAL | 0 refills | Status: AC

## 2024-09-30 MED ORDER — FUROSEMIDE 20 MG PO TABS: 20.0000 mg | ORAL_TABLET | Freq: Every day | ORAL | 0 refills | Status: AC

## 2024-09-30 NOTE — Progress Notes (Signed)
 @Patient  ID: Christy Richardson, female    DOB: Apr 02, 1935, 89 y.o.   MRN: [id number]  Chief Complaint  Patient presents with   Cough    Cough and wheezing x 1 week.     Referring provider: Shona Norleen PEDLAR, MD  HPI: 89 year old female, never smoker followed for bronchiectasis, mild obstructive lung disease and lung nodules.  She is a patient of Dr. Alois and last seen in office 09/02/2024.  Past medical history significant for migraines, hypertension, CHF, GERD, IBS, hypothyroidism, essential tremor, OA, HLD, anxiety, depression, glaucoma.  TEST/EVENTS:  08/31/2021 CT chest without contrast: Atherosclerosis. Bronchiectatic changes within the middle lobe and lower lobe on the right and lingular and lower lobe on the left.  4 mm left upper lobe pulmonary nodule, stable compared to previous.  Scattered areas of nodularity in the left lung base without change.  Biapical scarring similar to previous.  RUL nodule unchanged, measuring 5 mm.  Nodule RLL 6 mm and also unchanged.  Both stable over 2-year interval. 01/22/2023 PFT: FVC 98, FEV1 83, ratio 99, TLC 98, DLCO 58 09/02/2024 CXR: chronic elevated right hemidiaphragm. DML band-like consolidation and volume loss consistent with chronic RML atelectasis. Clear left lung.  09/14/2024 SNIFF: less motion of right hemidiaphragm without paradoxical movement; no paralysis or eventration   08/07/2023: OV with Dr. Brenna. Not been using vest therapy regularly does help at times but heavy and difficult to use. Managed okay on Stiolto and albuterol . Not had any recent exacerbations. Daily cough, stable. Nodules documented 2 years stability; no f/u needed due to current age and stability.   07/22/2024: Ov with Shernell Saldierna NP CRICKETT ABBETT is an 89 year old female with bronchiectasis and mild COPD who presents for follow up. She is accompanied by her daughter. She has ongoing shortness of breath, which she doesn't feel is much different than the last time she was seen. Her  breathing has been consistently poor, but there has not been a drastic change recently. She uses a nebulizer with albuterol  twice a day and Stiolto inhaler once daily. She is not using her saline nebs. She experiences a cough with yellow phlegm that comes and goes, and hears a 'little rattle' in her throat when coughing. This has been baseline for her for quite some time.  She reports voice hoarseness persisting for several months, affecting her communication as people often ask her to repeat herself. She uses saline nasal spray regularly and has been on Protonix  twice a day for reflux, which was increased a couple of months ago by another doctor. Despite this, she has not noticed any improvement in her voice hoarseness or cough. She also uses Nasacort  every night. Still has some postnasal drainage and sinus congestion.  She mentions having severe reflux and acknowledges that it could be contributing to her symptoms. No fever, chills, or hemoptysis. No difficulties swallowing, changes in bowel habits, abd pain, N/V. She currently has a urinary tract infection, which she describes as putting her in 'pretty bad shape' but feels like she's recovering with abx.  She has not been using vest therapy device, as she finds it heavy and difficult to use. She does have a flutter valve but hasn't been using it due to discomfort from a hernia. She has not been doing her saline nebs.   09/02/2024: OV with Dr. Catherine. Has ongoing voice hoarseness. Prior laryngoscopy unremarkable. Has GERD. Breathing is bad. Gets SOB with exertion. Last couple of days, she's been coughing. Rest improves dyspnea.  Sleeps in a recliner and more comfortable due to prior hip replacement and pain in her hip. Does not wheeze; tight in throat and chest. Has pulmonary congestion. Uses mucinex . Slight hypervolemia on exam; advised to discuss diuretic regimen with cardiology and obtain echo. Other differentials include right hemidiaphragmatic paralysis  given chronic elevation of right hemidiaphragm. SNIFF test and CXR ordered. D dimer to rule out clots. Remote hx of DVT in past. Hx of anemia.   09/30/2024: Today - acute  Discussed the use of AI scribe software for clinical note transcription with the patient, who gave verbal consent to proceed.  History of Present Illness  Christy Richardson is an 89 year old female with chronic respiratory issues who presents with worsening cough and wheezing.  She has been experiencing a worsening cough over the past three to four weeks, described as similar to a cold-related cough, though she has not had a cold. The cough is accompanied by wheezing, particularly when attempting to clear congestion, which she finds difficult. The sputum is light yellow, and there is no hemoptysis. She recently completed a course of bactrim for a urinary tract infection.  She has not been using her vest for airway clearance due to its weight. She occasionally uses a flutter valve but experiences discomfort due to a hernia, which is exacerbated by coughing or sneezing. She uses a pillow to splint the hernia.  No fever, chest pain.   She is undergoing gastrointestinal treatment, with a test scheduled in November for reflux evaluation. Following with GI. Eating sometimes triggers a dry cough, and her appetite is not strong, though she tries to drink more fluids due to kidney concerns.   She reports persistent swelling in her legs, particularly the left leg, which remains swollen despite rest. She has a history of a stress fracture in January to this leg. She has had a past blood clot in the right leg following hip surgery. She is not on anticoagulation therapy. She did have a d dimer test at her last visit that was elevated but corrected to normal for her age. She denies any calf pain or redness. She did have an ultrasound of this leg in January when the initial injury occurred; however, the swelling has worsened since then.   She  experiences shortness of breath and loud wheezing with minimal exertion, such as moving from room to room. She uses nebulizers and inhalers but sometimes forgets to use the inhaler in the morning and will do it at night. She is scheduled for an echocardiogram in November. She does not weigh herself consistently at home. No orthopnea, PND, palpitations.      Allergies  Allergen Reactions   Prednisone     REACTION: Increase eye pressure with gloucoma. Broke out in rash after injection per Dr. Beverley   Tramadol  Itching   Adhesive [Tape]     Causes blisters/irritates skin-surgical tape   Penicillins     REACTION: Rash   Sulfonamide Derivatives     REACTION: Rash - not sure    Immunization History  Administered Date(s) Administered   Fluad Quad(high Dose 65+) 09/19/2020   INFLUENZA, HIGH DOSE SEASONAL PF 09/20/2017, 09/19/2018, 09/01/2019   Influenza Split 09/05/2013, 08/18/2014, 08/31/2015   Influenza Whole 09/23/2006, 10/03/2007, 08/25/2008, 08/03/2010, 09/02/2012   Influenza-Unspecified 08/03/2014, 09/02/2018   Moderna Sars-Covid-2 Vaccination 01/28/2020, 02/26/2020   Pneumococcal Conjugate-13 08/18/2014   Pneumococcal Polysaccharide-23 05/04/2003    Past Medical History:  Diagnosis Date   Adenomatous polyp of colon 03/2007  Allergic rhinitis    Anxiety    a.) on BZO (lorazepam ) PRN   Arthritis    Blood transfusion 1974   Bronchiectasis (HCC)    Cancer (HCC)    left arm   Deaf    in right 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 Mild TR; b.) TTE 07/16/2019: EF 60-65%, triv AR, mild MR/TR, G1DD.   Diet-controlled type 2 diabetes mellitus (HCC)    Diverticulosis of colon (without mention of hemorrhage)    DOE (dyspnea on exertion)    Dyspnea    Fatigue    Fecal incontinence    GERD (gastroesophageal reflux disease)    Glaucoma    Goiter    right side   Heart murmur     History of stress test    a. 01/2014 Lexi MV: no ischemia/infarct.   Hyperkalemia    Hyperlipidemia    Hypertension    a. 11/2018 Renal artery duplex: No RAS.    Hypothyroidism    IBS (irritable bowel syndrome)    Migraine    Mitral valve prolapse    a. 01/2014 Echo: no MR/MVP noted.   Neck mass    right side/ no airway problems/ per pt has had since 2005 (goiter)   Nonspecific abnormal electrocardiogram (ECG) (EKG)    Palpitations    a. 01/2014 CardioNet: wore for 9 days (rash). Sinus tach - 100's. No SVT/AF.   Pneumonia    Polyp, sigmoid colon    Post-operative nausea and vomiting    Pulmonary fibrosis (HCC)    Pulmonary nodules    Recurrent UTI    Tachycardia    Tremor     Tobacco History: Social History   Tobacco Use  Smoking Status Never   Passive exposure: Past  Smokeless Tobacco Never   Counseling given: Not Answered   Outpatient Medications Prior to Visit  Medication Sig Dispense Refill   acetaminophen  (TYLENOL  8 HOUR ARTHRITIS PAIN) 650 MG CR tablet Take 650 mg by mouth every 8 (eight) hours as needed for pain.     albuterol  (PROVENTIL ) (2.5 MG/3ML) 0.083% nebulizer solution Take 3 mLs (2.5 mg total) by nebulization every 6 (six) hours as needed for wheezing or shortness of breath. 75 mL 12   albuterol  (VENTOLIN  HFA) 108 (90 Base) MCG/ACT inhaler Inhale 2 puffs into the lungs every 6 (six) hours as needed for wheezing or shortness of breath. 8 g 6   amLODipine  (NORVASC ) 5 MG tablet Take 1 tablet (5 mg total) by mouth daily. Take an extra tablet (5 mg) daily as needed for systolic blood pressure greater then 170. 180 tablet 3   brimonidine  (ALPHAGAN ) 0.2 % ophthalmic solution Place 1 drop into both eyes 2 (two) times daily. Place 1 drop into both eyes 2 (two) times daily.     carvedilol  (COREG ) 12.5 MG tablet Take 1 tablet (12.5 mg total) by mouth 2 (two) times daily with a meal. 180 tablet 2   cetirizine (ZYRTEC) 10 MG tablet Take 10 mg by mouth every morning.      Cholecalciferol (VITAMIN D3 PO) Take 1 tablet by mouth daily at 6 (six) AM.     cyclobenzaprine  (FLEXERIL ) 10 MG tablet Take 10 mg by mouth 3 (three) times daily as needed for muscle spasms.     dorzolamide  (TRUSOPT ) 2 % ophthalmic solution Place 1 drop into both eyes 2 (two) times  daily. Place 1 drop into both eyes 2 (two) times daily.     Ensifentrine  (OHTUVAYRE ) 3 MG/2.5ML SUSP Inhale 2.5 mLs into the lungs 2 (two) times daily.     estradiol  (ESTRACE ) 0.1 MG/GM vaginal cream Apply 0.5 gm vaginally with the applicator of tip of the finger nightly for 2 weeks and then 2-3x weekly. 42.5 g 12   famotidine  (PEPCID ) 20 MG tablet Take 1 tablet (20 mg total) by mouth 2 (two) times daily. 60 tablet 1   gabapentin (NEURONTIN) 300 MG capsule Take 300 mg by mouth at bedtime.     gentamicin ointment (GARAMYCIN) 0.1 % as needed (nose irritation).     guaifenesin  (MUCUS RELIEF) 400 MG TABS tablet Take 1,200 mg by mouth every morning.     hydrALAZINE  (APRESOLINE ) 50 MG tablet Take 0.5 tablets (25 mg total) by mouth daily as needed. 15 tablet 0   ipratropium (ATROVENT ) 0.06 % nasal spray Place 2 sprays into both nostrils 3 (three) times daily. 15 mL 12   levothyroxine  (SYNTHROID ) 75 MCG tablet Take 75 mcg by mouth daily before breakfast.     LORazepam  (ATIVAN ) 0.5 MG tablet Take 0.5 mg by mouth 2 (two) times daily as needed for anxiety (tremors).     losartan  (COZAAR ) 100 MG tablet TAKE ONE TABLET BY MOUTH ONCE DAILY. 90 tablet 3   Multiple Vitamins-Minerals (MULTIVITAMIN WITH MINERALS) tablet Take 1 tablet by mouth 3 (three) times a week.     nitrofurantoin , macrocrystal-monohydrate, (MACROBID ) 100 MG capsule Take 100 mg by mouth every 12 (twelve) hours.     pantoprazole  (PROTONIX ) 40 MG tablet Take 1 tablet (40 mg total) by mouth 2 (two) times daily. 180 tablet 3   Polyethyl Glycol-Propyl Glycol (SYSTANE OP) Apply 1 drop to eye as needed.     pravastatin  (PRAVACHOL ) 40 MG tablet Take 1 tablet (40 mg total) by  mouth at bedtime.     Probiotic Product (PROBIOTIC PO) Take 1 tablet by mouth daily at 6 (six) AM.     rOPINIRole (REQUIP) 0.5 MG tablet Take 0.5 mg by mouth at bedtime.     sertraline  (ZOLOFT ) 100 MG tablet Take 2 tablets by mouth every morning.     sodium chloride  (OCEAN) 0.65 % nasal spray Place 2 sprays into both nostrils daily at 6 (six) AM. Use as needed     sodium chloride  HYPERTONIC 3 % nebulizer solution Take as 3 mL neb as needed for cough or chest congestion 750 mL 12   Tiotropium Bromide-Olodaterol (STIOLTO RESPIMAT ) 2.5-2.5 MCG/ACT AERS Inhale 2 puffs into the lungs daily. 4 g 11   traMADol  (ULTRAM ) 50 MG tablet Take 1 tablet (50 mg total) by mouth every 6 (six) hours as needed. 30 tablet 0   traZODone  (DESYREL ) 50 MG tablet Take 50 mg by mouth at bedtime.     triamcinolone  (NASACORT ) 55 MCG/ACT AERO nasal inhaler Place 1 spray into the nose at bedtime.     albuterol  (PROVENTIL ) (2.5 MG/3ML) 0.083% nebulizer solution Take 3 mLs (2.5 mg total) by nebulization every 6 (six) hours as needed for wheezing or shortness of breath. 360 mL 2   Tiotropium Bromide-Olodaterol (STIOLTO RESPIMAT ) 2.5-2.5 MCG/ACT AERS Inhale 2 puffs into the lungs daily. 4 g 5   Tiotropium Bromide-Olodaterol (STIOLTO RESPIMAT ) 2.5-2.5 MCG/ACT AERS Inhale 2 puffs into the lungs daily.     Tiotropium Bromide-Olodaterol 2.5-2.5 MCG/ACT AERS Inhale 2 puffs into the lungs daily. (Patient not taking: Reported on 09/28/2024) 3 each 4   No  facility-administered medications prior to visit.     Review of Systems:  as above     Physical Exam:  BP 120/60   Pulse 65   Temp 97.9 F (36.6 C) (Temporal)   Ht 5' 8 (1.727 m)   Wt 162 lb (73.5 kg)   SpO2 97%   BMI 24.63 kg/m   GEN: Pleasant, interactive, well-appearing; elderly; in no acute distress. HEENT:  Normocephalic and atraumatic. PERRLA. Sclera white. Nasal turbinates pale, moist and patent bilaterally. No rhinorrhea present. Oropharynx pink and moist,  without exudate or edema. No lesions, ulcerations, or postnasal drip.  NECK:  Supple w/ fair ROM. No JVD present. Normal carotid impulses w/o bruits. Thyroid  symmetrical with no goiter or nodules palpated. No lymphadenopathy.   CV: RRR, no m/r/g. +1 pitting edema LLE, no edema RLE. Pulses intact, +2 bilaterally. No cyanosis, pallor or clubbing. PULMONARY:  Unlabored, regular breathing. Diminished bibasilar airflow otherwise clear bilaterally A&P w/o wheezes/rales/rhonchi. No accessory muscle use. No dullness to percussion. GI: BS present and normoactive. Soft, non-tender to palpation.  MSK: No erythema, warmth or tenderness. Cap refil <2 sec all extrem.  Neuro: A/Ox3. No focal deficits noted.   Skin: Warm, no lesions or rashe Psych: Normal affect and behavior. Judgement and thought content appropriate.     Lab Results:  CBC    Component Value Date/Time   WBC 5.0 07/31/2024 0820   RBC 4.44 07/31/2024 0820   HGB 13.6 07/31/2024 0820   HGB CANCELED 11/04/2018 1114   HCT 40.7 07/31/2024 0820   HCT CANCELED 11/04/2018 1114   PLT 202 07/31/2024 0820   PLT CANCELED 11/04/2018 1114   MCV 91.7 07/31/2024 0820   MCH 30.6 07/31/2024 0820   MCHC 33.4 07/31/2024 0820   RDW 12.5 07/31/2024 0820   LYMPHSABS 0.9 07/31/2024 0820   LYMPHSABS CANCELED 11/04/2018 1114   MONOABS 0.4 07/31/2024 0820   EOSABS 0.2 07/31/2024 0820   EOSABS CANCELED 11/04/2018 1114   BASOSABS 0.1 07/31/2024 0820   BASOSABS CANCELED 11/04/2018 1114    BMET    Component Value Date/Time   NA 136 07/31/2024 0820   NA 137 04/08/2024 1503   K 4.2 07/31/2024 0820   CL 101 07/31/2024 0820   CO2 24 07/31/2024 0820   GLUCOSE 127 (H) 07/31/2024 0820   BUN 15 07/31/2024 0820   BUN 13 04/08/2024 1503   CREATININE 0.88 07/31/2024 0820   CREATININE 1.01 04/12/2014 1419   CALCIUM 9.1 07/31/2024 0820   GFRNONAA >60 07/31/2024 0820   GFRAA >60 11/06/2018 0754    BNP No results found for: BNP   Imaging:  DG Sniff  Test Result Date: 09/14/2024 CLINICAL DATA:  R hemidiaphragm elevation Patient history of bronchiectasis, pulmonary nodules dyspnea on exertion chronic shortness of breath for many years with chronically elevated right hemidiaphragm seen on imaging since 2010. No chest or back surgeries per patient. Request for sniff test for further evaluation. EXAM: CHEST FLUOROSCOPY TECHNIQUE: Real-time fluoroscopic evaluation of the chest was performed. FLUOROSCOPY: Radiation Exposure Index (as provided by the fluoroscopic device): 5.70 mGy Kerma COMPARISON:  Chest x-ray 09/02/2024 06/21/2022, 07/31/2021 FINDINGS: Real-time fluoroscopy of both diaphragms was performed in the AP and lateral positions with the patient breathing normally followed by continuous sniffing. Noted elevation of the right hemidiaphragm which appears unchanged when compared with previous imaging. Orthograde movement of both hemidiaphragms noted however this significantly less motion seen on the right hemidiaphragm. No paradoxical movement or evidence of eventration. IMPRESSION: Relatively less motion seen of  the RIGHT hemidiaphragm, without paradoxical movement. No evidence of diaphragmatic paralysis or eventration. Electronically Signed   By: Thom Hall M.D.   On: 09/14/2024 15:56   DG Chest 2 View Result Date: 09/07/2024 EXAM: 2 VIEW(S) XRAY OF THE CHEST 09/02/2024 04:23:00 PM COMPARISON: 06/21/2022 CLINICAL HISTORY: DOE. Pt having SOB for a long time. Hx of hypertension, pneumonia. Non smoker FINDINGS: LUNGS AND PLEURA: Chronic Elevated right hemidiaphragm. Right middle lobe band-like consolidation and volume loss is increased from prior radiographic unchanged from 07/31/24 CT abdomen, where it was seen to represent complete right middle lobe atelectasis . Clear left lung. No pulmonary edema. No pleural effusion. No pneumothorax. HEART AND MEDIASTINUM: The cardiac silhouette is unremarkable. Calcified aorta is present. BONES AND SOFT TISSUES:  Thoracic spine degenerative changes. No acute osseous abnormality. IMPRESSION: 1. Chronic complete right middle lobe atelectasis 2. Chronic elevated right hemidiaphragm. 3. No acute cardiopulmonary disease. Electronically signed by: Selinda Blue MD 09/07/2024 01:44 PM EDT RP Workstation: [id number]    Administration History     None          Latest Ref Rng & Units 01/22/2023    4:15 PM 02/25/2018   10:55 AM  PFT Results  FVC-Pre L  3.15   FVC-Predicted Pre % 98  106   Pre FEV1/FVC % % 64  61   FEV1-Pre L 1.72  1.92   FEV1-Predicted Pre % 83  86   DLCO uncorrected ml/min/mmHg 11.88  14.54   DLCO UNC% % 58  51   DLCO corrected ml/min/mmHg 11.88    DLCO COR %Predicted % 58    DLVA Predicted % 64  58   TLC L 5.43  5.53   TLC % Predicted % 98  100   RV % Predicted % 97  86     No results found for: NITRICOXIDE      Assessment & Plan:   Assessment & Plan Bronchiectasis with mild COPD and chronic cough with sputum production Chronic cough with yellow sputum and mild COPD related to bronchiectasis. Exacerbation of coughing frequency, wheezing, and dyspnea over the last 3-4 weeks. No increased sputum production or infectious symptoms from baseline so will hold off on empiric abx. High symptom burden which is likely multifactorial. Will challenge her with systemic steroids and supportive care. Continue aggressive maintenance regimen. Send order to switch vest therapy to Smart Vest as current therapy is too heavy and limits her use. Avoid ICS in setting of btx. Action plan in place.  - Prescribe medrol  dosepak; difficulties tolerating steroids in the past - Continue ohtuvayre   - Continue Stiolto as prescribed. - Use albuterol  nebulizer as needed for phlegm clearance. - Use Mucinex  twice daily. - Use saline nebulizers and vest therapy for phlegm clearance. - Send DME order for SmartVest - Adjust flutter valve pressure to reduce hernia discomfort and utilize pillow for splinting  -  Monitor for signs of infection such as increased phlegm, bloody sputum, fever, or decreased appetite.  Gastroesophageal reflux disease (GERD) GERD with significant symptoms, including voice hoarseness and potential contribution to bronchiectasis. Currently on PPI. Undergoing workup with GI - GERD precautions - Follow up with GI as scheduled   Lower extremity edema, left greater than right Persistent lower extremity edema, left greater than right. Previous ultrasound for stress fracture in January, but swelling is new since then. No recent ultrasound for blood clots. D dimer was elevated but considered normal when corrected for age. Recommended ultrasound of LLE to rule out DVT. Evaluate  for potential cardiac component given increased dyspnea and edema. - Checked lab markers to assess for fluid overload or cardiac stress. - Order LLE doppler exam to rule out DVT  Anemia Hx of anemia. Last hgb 07/2024 nl. Recheck today to ensure no correlation to worsening DOE.     I spent 45 minutes of dedicated to the care of this patient on the date of this encounter to include pre-visit review of records, face-to-face time with the patient discussing conditions above, post visit ordering of testing, clinical documentation with the electronic health record, making appropriate referrals as documented, and communicating necessary findings to members of the patients care team.  Comer LULLA Rouleau, NP 09/30/2024  Pt aware and understands NP's role.

## 2024-09-30 NOTE — Patient Instructions (Addendum)
 Continue Albuterol  inhaler 2 puffs or 3 mL neb every 6 hours as needed for shortness of breath or wheezing. Notify if symptoms persist despite rescue inhaler/neb use.  Continue zyrtec 1 tab daily Continue pantoprazole  1 tab Twice daily  Continue stiolto 2 puffs daily Continue nasocort nasal spray  Continue Ipratropium nasal spray 2 sprays Three times a day as needed for nasal congestion/drainage  Continue Ohtuvayre  nebs Twice daily. This will be a new maintenance medication that you will use twice daily, regardless of symptoms  Continue famotidine  20 mg Twice daily as needed for breakthrough reflux  Placed orders for a new, lighter weight vest for you    -Use hypertonic saline nebulizer treatments Twice daily for cough/congestion and follow with flutter valve 10 times  -Guaifenesin  600 mg Twice daily for cough/congestion  -Medrol  dosepak - take as prescribed. Take in AM with food. Start tomorrow  Ultrasound of your left leg - can be done at [facility] today    Follow up with Dr. Bary in [place] in 4 weeks. If symptoms do not improve or worsen, please contact office for sooner follow up or seek emergency care.

## 2024-09-30 NOTE — Progress Notes (Signed)
 Mildly elevated BNP and evidence of volume overload with low sodium count and some stress on her kidneys. Likely contributing to her cough and dyspnea. Her potassium is also elevated, which will need to be rechecked in a week. I ordered repeat BMET in one week, which can be done at [facility]. If she develops palpitations or CP, should go to th eED. I am going to treat her with a short course of lasix (furosemide) which will make her pee more to help get rid of the excess fluid. She should start this tomorrow and take it in the morning. If she develops any lightheadedness/dizziness while taking, check BP and notify if <100/60. Otherwise, change positions slowly and use caution when moving around. Go to the ED if swelling of face/tongue occurs (rare side effect known as angioedema). Monitor weights daily in AM, in same amount of clothes with goal to get back down to baseline around 156 lb. Thanks.

## 2024-10-02 ENCOUNTER — Ambulatory Visit (HOSPITAL_COMMUNITY)
Admission: RE | Admit: 2024-10-02 | Discharge: 2024-10-02 | Disposition: A | Discharge status: Home / Self Care | Source: Ambulatory Visit | Attending: Nurse Practitioner | Admitting: Nurse Practitioner

## 2024-10-02 DIAGNOSIS — R600 Localized edema: Secondary | ICD-10-CM | POA: Diagnosis not present

## 2024-10-05 ENCOUNTER — Ambulatory Visit (HOSPITAL_COMMUNITY)
Admission: RE | Admit: 2024-10-05 | Discharge: 2024-10-05 | Disposition: A | Discharge status: Home / Self Care | Source: Ambulatory Visit | Attending: Gastroenterology | Admitting: Gastroenterology

## 2024-10-05 DIAGNOSIS — K219 Gastro-esophageal reflux disease without esophagitis: Secondary | ICD-10-CM | POA: Insufficient documentation

## 2024-10-05 DIAGNOSIS — R1310 Dysphagia, unspecified: Secondary | ICD-10-CM | POA: Diagnosis not present

## 2024-10-05 DIAGNOSIS — R058 Other specified cough: Secondary | ICD-10-CM | POA: Insufficient documentation

## 2024-10-05 DIAGNOSIS — R490 Dysphonia: Secondary | ICD-10-CM | POA: Diagnosis not present

## 2024-10-05 DIAGNOSIS — K224 Dyskinesia of esophagus: Secondary | ICD-10-CM | POA: Diagnosis not present

## 2024-10-05 DIAGNOSIS — K4090 Unilateral inguinal hernia, without obstruction or gangrene, not specified as recurrent: Secondary | ICD-10-CM | POA: Diagnosis not present

## 2024-10-05 DIAGNOSIS — R0600 Dyspnea, unspecified: Secondary | ICD-10-CM | POA: Insufficient documentation

## 2024-10-05 DIAGNOSIS — R1030 Lower abdominal pain, unspecified: Secondary | ICD-10-CM | POA: Diagnosis not present

## 2024-10-05 DIAGNOSIS — K5900 Constipation, unspecified: Secondary | ICD-10-CM | POA: Diagnosis not present

## 2024-10-05 DIAGNOSIS — K449 Diaphragmatic hernia without obstruction or gangrene: Secondary | ICD-10-CM | POA: Diagnosis not present

## 2024-10-05 NOTE — Progress Notes (Signed)
L LE [place] negative for DVT

## 2024-10-07 ENCOUNTER — Other Ambulatory Visit (HOSPITAL_COMMUNITY)
Admission: RE | Admit: 2024-10-07 | Discharge: 2024-10-07 | Disposition: A | Discharge status: Home / Self Care | Source: Ambulatory Visit | Attending: Nurse Practitioner | Admitting: Nurse Practitioner

## 2024-10-07 ENCOUNTER — Ambulatory Visit: Payer: Self-pay | Admitting: Gastroenterology

## 2024-10-07 DIAGNOSIS — E875 Hyperkalemia: Secondary | ICD-10-CM | POA: Insufficient documentation

## 2024-10-07 DIAGNOSIS — R600 Localized edema: Secondary | ICD-10-CM | POA: Diagnosis not present

## 2024-10-07 LAB — BASIC METABOLIC PANEL WITH GFR
Anion gap: 9 (ref 5–15)
BUN: 16 mg/dL (ref 8–23)
CO2: 28 mmol/L (ref 22–32)
Calcium: 9.2 mg/dL (ref 8.9–10.3)
Chloride: 100 mmol/L (ref 98–111)
Creatinine, Ser: 0.96 mg/dL (ref 0.44–1.00)
GFR, Estimated: 57 mL/min — ABNORMAL LOW (ref >60)
Glucose, Bld: 117 mg/dL — ABNORMAL HIGH (ref 70–99)
Potassium: 4.5 mmol/L (ref 3.5–5.1)
Sodium: 137 mmol/L (ref 135–145)

## 2024-10-09 ENCOUNTER — Ambulatory Visit: Payer: Self-pay | Admitting: Nurse Practitioner

## 2024-10-09 DIAGNOSIS — N390 Urinary tract infection, site not specified: Secondary | ICD-10-CM | POA: Diagnosis not present

## 2024-10-19 DIAGNOSIS — J471 Bronchiectasis with (acute) exacerbation: Secondary | ICD-10-CM | POA: Diagnosis not present

## 2024-10-21 ENCOUNTER — Telehealth: Payer: Self-pay | Admitting: Gastroenterology

## 2024-10-21 NOTE — Telephone Encounter (Signed)
 Please let patient know I discussed CT findings of her moderate-sized right inguinal hernia with Dr. Avram, who has advised that she have a consult with surgery and let them have further discussion with her about whether repair might be appropriate.   If she is agreeable, please send referral.

## 2024-10-21 NOTE — Telephone Encounter (Signed)
 The pt has been advised and agrees to the [facility] referral.  Referral made and records faxed

## 2024-10-23 ENCOUNTER — Ambulatory Visit: Payer: Self-pay | Admitting: Nurse Practitioner

## 2024-10-23 ENCOUNTER — Ambulatory Visit: Attending: Nurse Practitioner

## 2024-10-23 DIAGNOSIS — R0600 Dyspnea, unspecified: Secondary | ICD-10-CM

## 2024-10-23 LAB — ECHOCARDIOGRAM COMPLETE
AR max vel: 2.35 cm2
AV Area VTI: 2.53 cm2
AV Area mean vel: 2.53 cm2
AV Mean grad: 2 mmHg
AV Peak grad: 4.2 mmHg
Ao pk vel: 1.03 m/s
Area-P 1/2: 3.54 cm2
S' Lateral: 2.6 cm

## 2024-10-28 ENCOUNTER — Ambulatory Visit: Admitting: Nurse Practitioner

## 2024-11-01 DIAGNOSIS — J479 Bronchiectasis, uncomplicated: Secondary | ICD-10-CM | POA: Diagnosis not present

## 2024-11-01 DIAGNOSIS — J441 Chronic obstructive pulmonary disease with (acute) exacerbation: Secondary | ICD-10-CM | POA: Diagnosis not present

## 2024-11-02 ENCOUNTER — Ambulatory Visit: Admitting: Internal Medicine

## 2024-11-02 ENCOUNTER — Ambulatory Visit
Admission: EM | Admit: 2024-11-02 | Discharge: 2024-11-02 | Disposition: A | Discharge status: Home / Self Care | Attending: Nurse Practitioner | Admitting: Nurse Practitioner

## 2024-11-02 DIAGNOSIS — J069 Acute upper respiratory infection, unspecified: Secondary | ICD-10-CM

## 2024-11-02 DIAGNOSIS — Z8709 Personal history of other diseases of the respiratory system: Secondary | ICD-10-CM | POA: Diagnosis not present

## 2024-11-02 MED ORDER — DOXYCYCLINE HYCLATE 100 MG PO TABS: 100.0000 mg | ORAL_TABLET | Freq: Two times a day (BID) | ORAL | 0 refills | Status: AC

## 2024-11-02 NOTE — Discharge Instructions (Signed)
 Take medication as prescribed. Increase fluids and allow for plenty of rest. You may take over-the-counter Tylenol  as needed for pain, fever, or general discomfort. Continue your current medications prescribed by pulmonology. Recommend the use of a humidifier in your bedroom at nighttime during sleep and sleeping elevated on pillows while symptoms persist. Follow-up with pulmonology if symptoms fail to improve. Go to the emergency department if you experience shortness of breath, difficulty breathing, chest pain, or other concerns. Follow-up as needed.

## 2024-11-02 NOTE — ED Provider Notes (Signed)
 [facility]    CSN: [id number] Arrival date & time: 11/02/24  1856      History   Chief Complaint Chief Complaint  Patient presents with   Medication Problem    HPI Christy Richardson is a 89 y.o. female.   The history is provided by the patient and a relative.   Patient brought in by her daughter for complaints of an upper respiratory infection.  Patient was seen at another urgent care 1 day ago and was prescribed Levaquin.  COVID/flu test was performed which was negative.  Patient and her daughter state that the patient has refused the medications due to the side effects.  Daughter reports that they did go back up to the urgent care, states that there was a different provider working, at the urgent care was reviewed and they did not prescribe the medication.  Patient presents for complaints of cough, chest congestion, and generalized fatigue.  Denies fever, headache, ear pain, abdominal pain, nausea, vomiting, diarrhea, or rash.  Patient reports prior history of bronchial stasis along with COPD.  Patient states she is seen by pulmonology and has inhalers and a nebulizer machine that she uses at home.   Past Medical History:  Diagnosis Date   Adenomatous polyp of colon 03/2007   Allergic rhinitis    Anxiety    a.) on BZO (lorazepam ) PRN   Arthritis    Blood transfusion 1974   Bronchiectasis (HCC)    Cancer (HCC)    left arm   Deaf    in right 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 Mild TR; b.) TTE 07/16/2019: EF 60-65%, triv AR, mild MR/TR, G1DD.   Diet-controlled type 2 diabetes mellitus (HCC)    Diverticulosis of colon (without mention of hemorrhage)    DOE (dyspnea on exertion)    Dyspnea    Fatigue    Fecal incontinence    GERD (gastroesophageal reflux disease)    Glaucoma    Goiter    right side   Heart murmur    History of stress test    a. 01/2014 Lexi MV:  no ischemia/infarct.   Hyperkalemia    Hyperlipidemia    Hypertension    a. 11/2018 Renal artery duplex: No RAS.    Hypothyroidism    IBS (irritable bowel syndrome)    Migraine    Mitral valve prolapse    a. 01/2014 Echo: no MR/MVP noted.   Neck mass    right side/ no airway problems/ per pt has had since 2005 (goiter)   Nonspecific abnormal electrocardiogram (ECG) (EKG)    Palpitations    a. 01/2014 CardioNet: wore for 9 days (rash). Sinus tach - 100's. No SVT/AF.   Pneumonia    Polyp, sigmoid colon    Post-operative nausea and vomiting    Pulmonary fibrosis (HCC)    Pulmonary nodules    Recurrent UTI    Tachycardia    Tremor     Patient Active Problem List   Diagnosis Date Noted   S/P total right hip arthroplasty 07/11/2022   Status post total hip replacement, right 07/10/2022   Sinus tachycardia 02/04/2018   Hyperlipidemia LDL goal <70 08/16/2015   Left ventricular diastolic dysfunction, NYHA class 1 03/13/2014   Tremor, essential 05/07/2013   Fecal incontinence 09/30/2012   Pulmonary nodules 05/15/2012   Dyspnea 05/27/2011   Bronchiectasis without acute  exacerbation (HCC) 09/26/2009   OSTEOPENIA 03/17/2009   History of colonic polyps 09/27/2008   BACK PAIN 09/01/2008   LIPOMA 06/01/2008   UTI'S, RECURRENT 06/10/2007   Hypothyroidism 01/07/2007   Hyperlipidemia 01/07/2007   Anxiety 01/07/2007   DEPRESSION 01/07/2007   MIGRAINE HEADACHE 01/07/2007   GLAUCOMA NEC 01/07/2007   DECREASED HEARING 01/07/2007   Essential hypertension 01/07/2007   Allergic rhinitis 01/07/2007   GERD 01/07/2007   IBS 01/07/2007   OSTEOARTHRITIS 01/07/2007    Past Surgical History:  Procedure Laterality Date   ABDOMINAL HYSTERECTOMY     CARDIOVASCULAR STRESS TEST  09/19/2011   No scintigraphic evidence of inducible myocardial ischemia. Pharmacological stress test without chest pain or EKG changes for ischemia.   COLONOSCOPY     ESOPHAGOGASTRODUODENOSCOPY     MASTOIDECTOMY     rt  ear/ as a teenager   OOPHORECTOMY     SKIN CANCER EXCISION Left    arm   TOTAL HIP ARTHROPLASTY  5/10   left   TOTAL HIP ARTHROPLASTY Right 07/10/2022   Procedure: TOTAL HIP ARTHROPLASTY ANTERIOR APPROACH;  Surgeon: Kathlynn Sharper, MD;  Location: [facility]S;  Service: Orthopedics;  Laterality: Right;   TRANSTHORACIC ECHOCARDIOGRAM  09/19/2011   EF >55%, stage 1 diastolic dysfunction, mild tricuspid valve regurg    OB History   No obstetric history on file.      Home Medications    Prior to Admission medications   Medication Sig Start Date End Date Taking? Authorizing Provider  acetaminophen  (TYLENOL  8 HOUR ARTHRITIS PAIN) 650 MG CR tablet Take 650 mg by mouth every 8 (eight) hours as needed for pain.    [provider]  albuterol  (PROVENTIL ) (2.5 MG/3ML) 0.083% nebulizer solution Take 3 mLs (2.5 mg total) by nebulization every 6 (six) hours as needed for wheezing or shortness of breath. 07/29/24   Cobb, Comer GAILS, NP  albuterol  (VENTOLIN  HFA) 108 (90 Base) MCG/ACT inhaler Inhale 2 puffs into the lungs every 6 (six) hours as needed for wheezing or shortness of breath. 10/18/23   Icard, Adine CROME, DO  amLODipine  (NORVASC ) 5 MG tablet Take 1 tablet (5 mg total) by mouth daily. Take an extra tablet (5 mg) daily as needed for systolic blood pressure greater then 170. 06/08/24   Gollan, Evalene PARAS, MD  brimonidine  (ALPHAGAN ) 0.2 % ophthalmic solution Place 1 drop into both eyes 2 (two) times daily. Place 1 drop into both eyes 2 (two) times daily. 12/21/15   [provider]  carvedilol  (COREG ) 12.5 MG tablet Take 1 tablet (12.5 mg total) by mouth 2 (two) times daily with a meal. 08/06/24   Gollan, Evalene PARAS, MD  cetirizine (ZYRTEC) 10 MG tablet Take 10 mg by mouth every morning. 03/30/11   Parrett, Madelin RAMAN, NP  Cholecalciferol (VITAMIN D3 PO) Take 1 tablet by mouth daily at 6 (six) AM.    [provider]  cyclobenzaprine  (FLEXERIL ) 10 MG tablet Take 10 mg by mouth 3 (three)  times daily as needed for muscle spasms.    [provider]  dorzolamide  (TRUSOPT ) 2 % ophthalmic solution Place 1 drop into both eyes 2 (two) times daily. Place 1 drop into both eyes 2 (two) times daily. 11/03/15   [provider]  Ensifentrine  (OHTUVAYRE ) 3 MG/2.5ML SUSP Inhale 2.5 mLs into the lungs 2 (two) times daily. 07/22/24   Cobb, Comer GAILS, NP  estradiol  (ESTRACE ) 0.1 MG/GM vaginal cream Apply 0.5 gm vaginally with the applicator of tip of the finger nightly for  2 weeks and then 2-3x weekly. 04/18/23   Watt Rush, MD  famotidine  (PEPCID ) 20 MG tablet Take 1 tablet (20 mg total) by mouth 2 (two) times daily. 07/22/24   Cobb, Comer GAILS, NP  furosemide  (LASIX ) 20 MG tablet Take 1 tablet (20 mg total) by mouth daily for 3 days. 09/30/24 10/03/24  Cobb, Comer GAILS, NP  gabapentin (NEURONTIN) 300 MG capsule Take 300 mg by mouth at bedtime.    [provider]  gentamicin ointment (GARAMYCIN) 0.1 % as needed (nose irritation). 11/05/23   [provider]  guaifenesin  (MUCUS RELIEF) 400 MG TABS tablet Take 1,200 mg by mouth every morning.    [provider]  hydrALAZINE  (APRESOLINE ) 50 MG tablet Take 0.5 tablets (25 mg total) by mouth daily as needed. 09/27/22   Vivienne Lonni Ingle, NP  ipratropium (ATROVENT ) 0.06 % nasal spray Place 2 sprays into both nostrils 3 (three) times daily. 07/22/24   Malachy Comer GAILS, NP  levothyroxine  (SYNTHROID ) 75 MCG tablet Take 75 mcg by mouth daily before breakfast. 03/19/22   [provider]  LORazepam  (ATIVAN ) 0.5 MG tablet Take 0.5 mg by mouth 2 (two) times daily as needed for anxiety (tremors).    [provider]  losartan  (COZAAR ) 100 MG tablet TAKE ONE TABLET BY MOUTH ONCE DAILY. 07/28/24   Gollan, Timothy J, MD  methylPREDNISolone  (MEDROL  DOSEPAK) 4 MG TBPK tablet Take as directed 09/30/24   Cobb, Katherine V, NP  Multiple Vitamins-Minerals (MULTIVITAMIN WITH MINERALS) tablet Take 1 tablet by  mouth 3 (three) times a week.    [provider]  nitrofurantoin , macrocrystal-monohydrate, (MACROBID ) 100 MG capsule Take 100 mg by mouth every 12 (twelve) hours. 07/20/24   [provider]  pantoprazole  (PROTONIX ) 40 MG tablet Take 1 tablet (40 mg total) by mouth 2 (two) times daily. 05/26/24   Gollan, Timothy J, MD  Polyethyl Glycol-Propyl Glycol (SYSTANE OP) Apply 1 drop to eye as needed.    [provider]  pravastatin  (PRAVACHOL ) 40 MG tablet Take 1 tablet (40 mg total) by mouth at bedtime. 03/30/11   Parrett, Madelin RAMAN, NP  Probiotic Product (PROBIOTIC PO) Take 1 tablet by mouth daily at 6 (six) AM.    [provider]  rOPINIRole (REQUIP) 0.5 MG tablet Take 0.5 mg by mouth at bedtime. 09/05/22   [provider]  sertraline  (ZOLOFT ) 100 MG tablet Take 2 tablets by mouth every morning. 12/19/21   [provider]  sodium chloride  (OCEAN) 0.65 % nasal spray Place 2 sprays into both nostrils daily at 6 (six) AM. Use as needed 03/30/11   Parrett, Madelin RAMAN, NP  sodium chloride  HYPERTONIC 3 % nebulizer solution Take as 3 mL neb as needed for cough or chest congestion 02/08/23   Cobb, Comer GAILS, NP  Tiotropium Bromide -Olodaterol (STIOLTO RESPIMAT ) 2.5-2.5 MCG/ACT AERS Inhale 2 puffs into the lungs daily. 09/16/24   Darlean Ozell NOVAK, MD  traMADol  (ULTRAM ) 50 MG tablet Take 1 tablet (50 mg total) by mouth every 6 (six) hours as needed. 07/13/22   Charlene Debby BROCKS, PA-C  traZODone  (DESYREL ) 50 MG tablet Take 50 mg by mouth at bedtime.    [provider]  triamcinolone  (NASACORT ) 55 MCG/ACT AERO nasal inhaler Place 1 spray into the nose at bedtime.    [provider]    Family History Family History  Problem Relation Age of Onset   Diabetes Father    Throat cancer Mother    Coronary artery disease Mother  Prostate cancer Brother    Asthma Daughter    Scoliosis Daughter    Colon cancer Paternal Grandfather    Esophageal cancer Neg Hx     Rectal cancer Neg Hx    Stomach cancer Neg Hx     Social History Social History   Tobacco Use   Smoking status: Never    Passive exposure: Past   Smokeless tobacco: Never  Vaping Use   Vaping status: Never Used  Substance Use Topics   Alcohol  use: Yes    Comment: rare   Drug use: No     Allergies   Prednisone, Tramadol , Adhesive [tape], Penicillins, and Sulfonamide derivatives   Review of Systems Review of Systems Per HPI  Physical Exam Triage Vital Signs ED Triage Vitals  Encounter Vitals Group     BP 11/02/24 1946 (!) 145/76     Girls Systolic BP Percentile --      Girls Diastolic BP Percentile --      Boys Systolic BP Percentile --      Boys Diastolic BP Percentile --      Pulse Rate 11/02/24 1946 76     Resp 11/02/24 1946 15     Temp 11/02/24 1946 98.7 F (37.1 C)     Temp src --      SpO2 11/02/24 1946 93 %     Weight --      Height --      Head Circumference --      Peak Flow --      Pain Score 11/02/24 1945 4     Pain Loc --      Pain Education --      Exclude from Growth Chart --    No data found.  Updated Vital Signs BP (!) 145/76   Pulse 76   Temp 98.7 F (37.1 C)   Resp 15   SpO2 93%   Visual Acuity Right Eye Distance:   Left Eye Distance:   Bilateral Distance:    Right Eye Near:   Left Eye Near:    Bilateral Near:     Physical Exam Vitals and nursing note reviewed.  Constitutional:      General: She is not in acute distress.    Appearance: Normal appearance. She is well-developed.  HENT:     Head: Normocephalic and atraumatic.     Right Ear: Tympanic membrane, ear canal and external ear normal.     Left Ear: Tympanic membrane, ear canal and external ear normal.     Nose: Congestion present.     Right Turbinates: Enlarged and swollen.     Left Turbinates: Enlarged and swollen.     Right Sinus: No maxillary sinus tenderness or frontal sinus tenderness.     Left Sinus: No maxillary sinus tenderness or frontal sinus  tenderness.     Mouth/Throat:     Lips: Pink.     Mouth: Mucous membranes are moist.     Pharynx: Uvula midline. Posterior oropharyngeal erythema and postnasal drip present. No pharyngeal swelling, oropharyngeal exudate or uvula swelling.     Comments: Cobblestoning present to posterior oropharynx  Eyes:     Extraocular Movements: Extraocular movements intact.     Conjunctiva/sclera: Conjunctivae normal.     Pupils: Pupils are equal, round, and reactive to light.  Neck:     Thyroid : No thyromegaly.     Trachea: No tracheal deviation.  Cardiovascular:     Rate and Rhythm: Normal rate and regular rhythm.  Pulses: Normal pulses.     Heart sounds: Normal heart sounds.  Pulmonary:     Effort: Pulmonary effort is normal. No respiratory distress.     Breath sounds: Normal breath sounds. No stridor. No wheezing, rhonchi or rales.  Abdominal:     General: Bowel sounds are normal.     Palpations: Abdomen is soft.     Tenderness: There is no abdominal tenderness.  Musculoskeletal:     Cervical back: Normal range of motion and neck supple.  Skin:    General: Skin is warm and dry.  Neurological:     General: No focal deficit present.     Mental Status: She is alert and oriented to person, place, and time.  Psychiatric:        Mood and Affect: Mood normal.        Behavior: Behavior normal.        Thought Content: Thought content normal.        Judgment: Judgment normal.      UC Treatments / Results  Labs (all labs ordered are listed, but only abnormal results are displayed) Labs Reviewed - No data to display  EKG   Radiology No results found.  Procedures Procedures (including critical care time)  Medications Ordered in UC Medications - No data to display  Initial Impression / Assessment and Plan / UC Course  I have reviewed the triage vital signs and the nursing notes.  Pertinent labs & imaging results that were available during my care of the patient were reviewed by  me and considered in my medical decision making (see chart for details).  On exam, the patient's lung sounds are clear throughout, room air sats are at 93%.  Vital signs are stable at this time.  Patient symptoms consistent with viral etiology; however, given her underlying history of bronchial stasis and COPD, will treat empirically with doxycycline  100 mg.  Supportive care recommendations were provided and discussed with the patient to include fluids, rest, over-the-counter Tylenol , and use of a humidifier during sleep.  Patient advised to continue her current medications.  Discussed indications with the patient and her daughter regarding follow-up, recommended follow-up with pulmonology if symptoms fail to improve.  Patient and daughter were in agreement with this plan of care and verbalized understanding.  All questions were answered.  Patient stable for discharge.   Final Clinical Impressions(s) / UC Diagnoses   Final diagnoses:  None   Discharge Instructions   None    ED Prescriptions   None    PDMP not reviewed this encounter.   Gilmer Etta PARAS, NP 11/02/24 2012

## 2024-11-02 NOTE — ED Triage Notes (Signed)
 Pt present with c/o possible URI. Pt has already had a COVID and Flu test done at another UC. States she was given  the wrong abx and is requesting an alternative.

## 2024-11-03 ENCOUNTER — Encounter: Payer: Self-pay | Admitting: Internal Medicine

## 2024-11-03 ENCOUNTER — Telehealth: Payer: Self-pay | Admitting: Gastroenterology

## 2024-11-03 NOTE — Telephone Encounter (Signed)
 Noted will await further communication from the pt

## 2024-11-03 NOTE — Telephone Encounter (Signed)
 Called and spoke to patient to get an update on her symptoms since she was not able to make it to her appointment with Dr. Avram yesterday.  She has an acute upper respiratory infection.  Still having a lot of acid reflux.  Has been taking Protonix  daily, only taking famotidine  at night as needed.  Advised that she could take famotidine  every night to see if this would help.  Reports bowel movements have been crazy.  Reports a history of longstanding irritable bowel syndrome, ongoing for years.  Previously was dealing with constipation when I saw her in the office, now having looser stools so has backed off of MiraLAX.  Denies any blood in her stool or on the toilet paper.  Reports she is having some difficulty controlling her bowel movements.  Wears Depends.  As she is continuing to have issues and was unable to make it to her appointment yesterday, I did offer that we could try to work her in for an earlier appointment with me before the end of the year if she starts feeling better. If she calls wanting to be seen sooner, I am okay with working her into my schedule. Would not cancel appointment with Dr. Avram in January, though, in case follow up with him is also needed.

## 2024-11-06 ENCOUNTER — Ambulatory Visit: Admitting: Nurse Practitioner

## 2024-11-10 ENCOUNTER — Encounter: Payer: Self-pay | Admitting: Internal Medicine

## 2024-11-11 DIAGNOSIS — H7011 Chronic mastoiditis, right ear: Secondary | ICD-10-CM | POA: Diagnosis not present

## 2024-11-11 DIAGNOSIS — H903 Sensorineural hearing loss, bilateral: Secondary | ICD-10-CM | POA: Diagnosis not present

## 2024-11-11 DIAGNOSIS — H6981 Other specified disorders of Eustachian tube, right ear: Secondary | ICD-10-CM | POA: Diagnosis not present

## 2024-12-08 ENCOUNTER — Other Ambulatory Visit: Payer: Self-pay

## 2024-12-08 ENCOUNTER — Ambulatory Visit: Admitting: Pulmonary Disease

## 2024-12-08 DIAGNOSIS — J471 Bronchiectasis with (acute) exacerbation: Secondary | ICD-10-CM

## 2024-12-09 ENCOUNTER — Other Ambulatory Visit: Payer: Self-pay

## 2024-12-09 MED ORDER — ALBUTEROL SULFATE HFA 108 (90 BASE) MCG/ACT IN AERS: 2.0000 | INHALATION_SPRAY | Freq: Four times a day (QID) | RESPIRATORY_TRACT | 6 refills | Status: AC | PRN

## 2024-12-11 ENCOUNTER — Other Ambulatory Visit: Payer: Self-pay | Admitting: Nurse Practitioner

## 2024-12-11 ENCOUNTER — Telehealth: Payer: Self-pay | Admitting: Nurse Practitioner

## 2024-12-11 DIAGNOSIS — J471 Bronchiectasis with (acute) exacerbation: Secondary | ICD-10-CM

## 2024-12-11 DIAGNOSIS — K219 Gastro-esophageal reflux disease without esophagitis: Secondary | ICD-10-CM

## 2024-12-11 MED ORDER — SODIUM CHLORIDE 3 % IN NEBU: INHALATION_SOLUTION | RESPIRATORY_TRACT | 12 refills | Status: AC

## 2024-12-11 NOTE — Telephone Encounter (Signed)
 Copied from CRM [id number]. Topic: Clinical - Medication Refill >> Dec 11, 2024 10:42 AM [name] wrote: Medication: sodium chloride  HYPERTONIC 3 % nebulizer solution [[id number]]  Has the patient contacted their pharmacy? Yes (Agent: If no, request that the patient contact the pharmacy for the refill. If patient does not wish to contact the pharmacy document the reason why and proceed with request.) (Agent: If yes, when and what did the pharmacy advise?)  This is the patient's preferred pharmacy:  [facility] [place], [place] - [id number] [place] [address] Phone: [phone] Fax: [phone]  Is this the correct pharmacy for this prescription? Yes If no, delete pharmacy and type the correct one.   Has the prescription been filled recently? No  Is the patient out of the medication? Yes  Has the patient been seen for an appointment in the last year OR does the patient have an upcoming appointment? Yes  Can we respond through MyChart? No  Agent: Please be advised that Rx refills may take up to 3 business days. We ask that you follow-up with your pharmacy.

## 2024-12-11 NOTE — Telephone Encounter (Signed)
 Order sent in.NFN

## 2024-12-21 ENCOUNTER — Encounter: Payer: Self-pay | Admitting: Internal Medicine

## 2024-12-21 ENCOUNTER — Ambulatory Visit: Admitting: Internal Medicine

## 2024-12-21 VITALS — BP 110/60 | HR 76 | Ht 68.0 in | Wt 160.5 lb

## 2024-12-21 DIAGNOSIS — R1319 Other dysphagia: Secondary | ICD-10-CM | POA: Diagnosis not present

## 2024-12-21 DIAGNOSIS — K4090 Unilateral inguinal hernia, without obstruction or gangrene, not specified as recurrent: Secondary | ICD-10-CM

## 2024-12-21 DIAGNOSIS — R32 Unspecified urinary incontinence: Secondary | ICD-10-CM | POA: Diagnosis not present

## 2024-12-21 DIAGNOSIS — R159 Full incontinence of feces: Secondary | ICD-10-CM | POA: Diagnosis not present

## 2024-12-21 DIAGNOSIS — K222 Esophageal obstruction: Secondary | ICD-10-CM

## 2024-12-21 NOTE — Progress Notes (Signed)
 "       Christy Richardson 89 y.o. 09-07-1935 [id number]  Assessment & Plan:   Encounter Diagnoses  Name Primary?   Esophageal dysphagia Yes   Esophageal stricture    Urinary and fecal incontinence    Right inguinal hernia     Esophageal stricture with dysphagia - cricopharyngeal bar (barium swallow images personally reviewed with the patient and caregiver present today)  - Increased peri-procedural risk due to bronchiectasis and potential sedation causing respiratory problems. Would be best to have  procedure at hospital where there is a greater safety net should problems occur.  Endoscopic dilation anticipated to be beneficial. - Scheduled upper endoscopy with esophageal dilation at hospital in March. Have fluoroscopy available.  Gastroesophageal reflux disease Longstanding GERD with persistent symptoms despite optimized therapy. Partial symptom control. - Continued Protonix  40 mg in the morning. - Continued Pepcid  at bedtime. - Await EGD  Fecal incontinence + Urinary incontinence Chronic fecal incontinence with alternating constipation and diarrhea, impaired rectal muscle tone, significant impact on quality of life. - Performed rectal examination to assess muscle tone and evaluate etiology. - Reviewed bowel regimen including daily MiraLAX and Metamucil. Increase Metamucil from 2 teaspoons to 2 tablespoons daily. - return to Dr. Watt of urology to see if there are additional non-operative measures possible - ? pelvic floor PT which could help urinary and fecal incontinence  Right inguinal hernia Surgery consultation is tomorrow - advised that surgical intervention may not be recommended due to age and health but should follow through with evaluation toi understand what options are as it is symptomatic.  Subjective:   Chief Complaint: Dysphagia  HPI Discussed the use of AI scribe software for clinical note transcription with the patient, who gave verbal consent to  proceed.  History of Present Illness   Christy Richardson is an 89 year old female with bronchectasis and chronic gastroesophageal reflux disease who presents for evaluation of worsening dysphagia. She returns after barium esophagram has shown proximal esophageal stenosis/stricture  Dysphagia and Esophageal Stricture: - Intermittent dysphagia, especially with pills and solid foods - Requires large amounts of water to swallow certain foods - Recent episode after eating rice with acute tightness and chokey sensation in upper chest or abdomen, unrelieved by fluids - Sensation of food not wanting to go down - Aware of narrowing in upper esophagus from prior barium swallow imaging   Gastroesophageal Reflux Symptoms: - Longstanding reflux symptoms with significant burping after meals, persisting for years - Protonix  40 mg before breakfast provides some improvement, but severe burping continues even after eating simple foods such as cereal - Pepcid  taken at bedtime - No heartburn or burning - Sensation of tightness when swallowing  Altered Bowel Habits and Fecal Incontinence: - Irregular bowel habits alternating between constipation and diarrhea - Daily use of MiraLAX and Metamucil - Fecal incontinence with involuntary stool passage, requiring use of Depends at all times - No prolonged periods without bowel movements - Occasional normal bowel movements - Previously diagnosed with IBS - Chronic symptoms significantly impact quality of life  Urinary Incontinence: - Urinary leakage triggered by coughing or sneezing - Nocturia with urgency, often unable to reach bathroom in time - History of frequent urinary tract infections - Status post complete hysterectomy with removal of both ovaries - frequent UTI's - has seen dr. Watt of GU  Chronic Dyspnea and Bronchiectasis: - Chronic shortness of breath present for years, unchanged - Unable to walk far; becomes short of breath with minimal  exertion,  such as walking around the house - No supplemental oxygen use - Bronchiectasis and pulmonary nodules present - Airway clearance managed with vest, nebulizer (albuterol  and sodium chloride ), and inhaler   Inguinal hernia - right inguinal hernia, painful at times - has surgical evaluation tomorrow at [facility], not inclined to have surgery given her age and health issues      Lab Results  Component Value Date   WBC 5.8 09/30/2024   HGB 13.3 09/30/2024   HCT 39.5 09/30/2024   MCV 89.4 09/30/2024   PLT 213 09/30/2024   Lab Results  Component Value Date   NA 137 10/07/2024   CL 100 10/07/2024   K 4.5 10/07/2024   CO2 28 10/07/2024   BUN 16 10/07/2024   CREATININE 0.96 10/07/2024   GFRNONAA 57 (L) 10/07/2024   CALCIUM 9.2 10/07/2024   ALBUMIN 4.2 03/20/2018   GLUCOSE 117 (H) 10/07/2024       Allergies[1] Active Medications[2] Past Medical History:  Diagnosis Date   Adenomatous polyp of colon 03/2007   Allergic rhinitis    Anxiety    a.) on BZO (lorazepam ) PRN   Arthritis    Blood transfusion 1974   Bronchiectasis (HCC)    Cancer (HCC)    left arm   Deaf    in right 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 Mild TR; b.) TTE 07/16/2019: EF 60-65%, triv AR, mild MR/TR, G1DD.   Diet-controlled type 2 diabetes mellitus (HCC)    Diverticulosis of colon (without mention of hemorrhage)    DOE (dyspnea on exertion)    Dyspnea    Fatigue    Fecal incontinence    GERD (gastroesophageal reflux disease)    Glaucoma    Goiter    right side   Heart murmur    History of stress test    a. 01/2014 Lexi MV: no ischemia/infarct.   Hyperkalemia    Hyperlipidemia    Hypertension    a. 11/2018 Renal artery duplex: No RAS.    Hypothyroidism    IBS (irritable bowel syndrome)    Migraine    Mitral valve prolapse    a. 01/2014 Echo: no MR/MVP noted.   Neck mass    right side/ no  airway problems/ per pt has had since 2005 (goiter)   Nonspecific abnormal electrocardiogram (ECG) (EKG)    Palpitations    a. 01/2014 CardioNet: wore for 9 days (rash). Sinus tach - 100's. No SVT/AF.   Pneumonia    Polyp, sigmoid colon    Post-operative nausea and vomiting    Pulmonary fibrosis (HCC)    Pulmonary nodules    Recurrent UTI    Tachycardia    Tremor    Past Surgical History:  Procedure Laterality Date   ABDOMINAL HYSTERECTOMY     CARDIOVASCULAR STRESS TEST  09/19/2011   No scintigraphic evidence of inducible myocardial ischemia. Pharmacological stress test without chest pain or EKG changes for ischemia.   COLONOSCOPY     ESOPHAGOGASTRODUODENOSCOPY     MASTOIDECTOMY     rt ear/ as a teenager   OOPHORECTOMY     SKIN CANCER EXCISION Left    arm   TOTAL HIP ARTHROPLASTY  5/10   left   TOTAL HIP ARTHROPLASTY Right 07/10/2022   Procedure: TOTAL HIP ARTHROPLASTY ANTERIOR APPROACH;  Surgeon: Kathlynn Sharper, MD;  Location: [facility] OR[facility];  Service: Orthopedics;  Laterality:  Right;   TRANSTHORACIC ECHOCARDIOGRAM  09/19/2011   EF >55%, stage 1 diastolic dysfunction, mild tricuspid valve regurg   Social History   Social History Narrative   Widowed since 2002. Lives with her Daughter.   family history includes Asthma in her daughter; Colon cancer in her paternal grandfather; Coronary artery disease in her mother; Diabetes in her father; Prostate cancer in her brother; Scoliosis in her daughter; Throat cancer in her mother.   Review of Systems As per HPI  Objective:   Physical Exam BP 110/60   Pulse 76   Ht 5' 8 (1.727 m)   Wt 160 lb 8 oz (72.8 kg)   BMI 24.40 kg/m  Elderly white woman no acute distress looking younger than stated age.  She is able to get on the exam table with minimal assist.  Lungs some kyphosis clear overall no wheezing or rales good air movement Heart sounds normal S1-S2 no rubs or gallops Abdomen is soft and nontender, however there is a right  inguinal hernia that is elicited with Valsalva and cough.  It is slightly tender.  It easily reduces. She is alert and oriented x 3 and has an appropriate mood and affect  Alan Fusi, CMA present  Rectal exam reveals normal anoderm. Resting and voluntary anal tone is reduced. There are pieces of formed brown stool in the rectal vault without rectocele or mass.  The exam is nontender.  Simulated defecation reveals appropriate abdominal contraction and relaxation and descent (slightly diminished).  Data reviewed see HPI and assessment and plan Also have reviewed urgent care note 11/02/2024 when she had upper respiratory infection    [1]  Allergies Allergen Reactions   Prednisone     REACTION: Increase eye pressure with gloucoma. Broke out in rash after injection per Dr. Beverley   Tramadol  Itching   Adhesive [Tape]     Causes blisters/irritates skin-surgical tape   Penicillins     REACTION: Rash   Sulfonamide Derivatives     REACTION: Rash - not sure  [2]  Current Meds  Medication Sig   acetaminophen  (TYLENOL  8 HOUR ARTHRITIS PAIN) 650 MG CR tablet Take 650 mg by mouth every 8 (eight) hours as needed for pain.   albuterol  (PROVENTIL ) (2.5 MG/3ML) 0.083% nebulizer solution Take 3 mLs (2.5 mg total) by nebulization every 6 (six) hours as needed for wheezing or shortness of breath.   albuterol  (VENTOLIN  HFA) 108 (90 Base) MCG/ACT inhaler Inhale 2 puffs into the lungs every 6 (six) hours as needed for wheezing or shortness of breath.   amLODipine  (NORVASC ) 5 MG tablet Take 1 tablet (5 mg total) by mouth daily. Take an extra tablet (5 mg) daily as needed for systolic blood pressure greater then 170.   brimonidine  (ALPHAGAN ) 0.2 % ophthalmic solution Place 1 drop into both eyes 2 (two) times daily. Place 1 drop into both eyes 2 (two) times daily.   carvedilol  (COREG ) 12.5 MG tablet Take 1 tablet (12.5 mg total) by mouth 2 (two) times daily with a meal.   cetirizine (ZYRTEC) 10 MG tablet  Take 10 mg by mouth every morning.   Cholecalciferol (VITAMIN D3 PO) Take 1 tablet by mouth daily at 6 (six) AM.   cyclobenzaprine  (FLEXERIL ) 10 MG tablet Take 10 mg by mouth 3 (three) times daily as needed for muscle spasms.   dorzolamide  (TRUSOPT ) 2 % ophthalmic solution Place 1 drop into both eyes 2 (two) times daily. Place 1 drop into both eyes 2 (two) times daily.  Ensifentrine  (OHTUVAYRE ) 3 MG/2.5ML SUSP Inhale 2.5 mLs into the lungs 2 (two) times daily.   estradiol  (ESTRACE ) 0.1 MG/GM vaginal cream Apply 0.5 gm vaginally with the applicator of tip of the finger nightly for 2 weeks and then 2-3x weekly.   famotidine  (PEPCID ) 20 MG tablet Take 1 tablet (20 mg total) by mouth 2 (two) times daily.   furosemide  (LASIX ) 20 MG tablet Take 1 tablet (20 mg total) by mouth daily for 3 days.   gabapentin (NEURONTIN) 300 MG capsule Take 300 mg by mouth at bedtime.   gentamicin ointment (GARAMYCIN) 0.1 % as needed (nose irritation).   guaifenesin  (MUCUS RELIEF) 400 MG TABS tablet Take 1,200 mg by mouth every morning.   hydrALAZINE  (APRESOLINE ) 50 MG tablet Take 0.5 tablets (25 mg total) by mouth daily as needed.   ipratropium (ATROVENT ) 0.06 % nasal spray Place 2 sprays into both nostrils 3 (three) times daily.   levothyroxine  (SYNTHROID ) 75 MCG tablet Take 75 mcg by mouth daily before breakfast.   LORazepam  (ATIVAN ) 0.5 MG tablet Take 0.5 mg by mouth 2 (two) times daily as needed for anxiety (tremors).   losartan  (COZAAR ) 100 MG tablet TAKE ONE TABLET BY MOUTH ONCE DAILY.   methylPREDNISolone  (MEDROL  DOSEPAK) 4 MG TBPK tablet Take as directed   Multiple Vitamins-Minerals (MULTIVITAMIN WITH MINERALS) tablet Take 1 tablet by mouth 3 (three) times a week.   nitrofurantoin , macrocrystal-monohydrate, (MACROBID ) 100 MG capsule Take 100 mg by mouth every 12 (twelve) hours.   pantoprazole  (PROTONIX ) 40 MG tablet Take 1 tablet (40 mg total) by mouth 2 (two) times daily.   Polyethyl Glycol-Propyl Glycol  (SYSTANE OP) Apply 1 drop to eye as needed.   pravastatin  (PRAVACHOL ) 40 MG tablet Take 1 tablet (40 mg total) by mouth at bedtime.   Probiotic Product (PROBIOTIC PO) Take 1 tablet by mouth daily at 6 (six) AM.   rOPINIRole (REQUIP) 0.5 MG tablet Take 0.5 mg by mouth at bedtime.   sertraline  (ZOLOFT ) 100 MG tablet Take 2 tablets by mouth every morning.   sodium chloride  (OCEAN) 0.65 % nasal spray Place 2 sprays into both nostrils daily at 6 (six) AM. Use as needed   sodium chloride  HYPERTONIC 3 % nebulizer solution Take as 3 mL neb as needed for cough or chest congestion   Tiotropium Bromide -Olodaterol (STIOLTO RESPIMAT ) 2.5-2.5 MCG/ACT AERS Inhale 2 puffs into the lungs daily.   traMADol  (ULTRAM ) 50 MG tablet Take 1 tablet (50 mg total) by mouth every 6 (six) hours as needed.   traZODone  (DESYREL ) 50 MG tablet Take 50 mg by mouth at bedtime.   triamcinolone  (NASACORT ) 55 MCG/ACT AERO nasal inhaler Place 1 spray into the nose at bedtime.   "

## 2024-12-21 NOTE — Patient Instructions (Signed)
 Change your Metamucil to 2 tablespoons daily.   Follow up with Dr. Watt for your urinary incontinence.   Keep your appointment with the surgeon tomorrow.   You have been scheduled for an endoscopy. Please follow written instructions given to you at your visit today.  If you use inhalers (even only as needed), please bring them with you on the day of your procedure.  If you take any of the following medications, they will need to be adjusted prior to your procedure:   DO NOT TAKE 7 DAYS PRIOR TO TEST- Trulicity (dulaglutide) Ozempic, Wegovy (semaglutide) Mounjaro, Zepbound (tirzepatide) Bydureon Bcise (exanatide extended release)  DO NOT TAKE 1 DAY PRIOR TO YOUR TEST Rybelsus (semaglutide) Adlyxin (lixisenatide) Victoza (liraglutide) Byetta (exanatide) _____________________________________________________________

## 2024-12-23 ENCOUNTER — Telehealth: Payer: Self-pay | Admitting: Internal Medicine

## 2024-12-23 NOTE — Telephone Encounter (Signed)
 Recommendations?

## 2024-12-23 NOTE — Telephone Encounter (Signed)
 I had scheduled him for the hospital due to her lung conditions.  At first I was going to do her in the [facility] but we changed after discussing it further.  I do think it could potentially be done in the [facility].  Let her know I am going to do some checking to see if we could do it in the [facility] where I think her costs would be lower.  I will have to be comfortable that it is safe enough for her if so.  I/we will get back to her.  I am thinking she will regard to the lung specialist for clearance.  Then we will have to discuss with anesthesia.  I am copying Norleen Schillings so he can look it over and tell me if he has concerns.

## 2024-12-23 NOTE — Telephone Encounter (Signed)
 Patient notified

## 2024-12-23 NOTE — Telephone Encounter (Signed)
 Inbound call from patient stating that she would like to speak to someone in regards to her EGD that was scheduled for March. Patient stated that she is not able to pay her hospital co pay and would like to know what her next steps would be. Patient is requesting a call back. Please advise.

## 2024-12-24 ENCOUNTER — Telehealth (HOSPITAL_BASED_OUTPATIENT_CLINIC_OR_DEPARTMENT_OTHER): Payer: Self-pay | Admitting: *Deleted

## 2024-12-24 ENCOUNTER — Telehealth (HOSPITAL_BASED_OUTPATIENT_CLINIC_OR_DEPARTMENT_OTHER): Payer: Self-pay | Admitting: Pulmonary Disease

## 2024-12-24 ENCOUNTER — Telehealth: Payer: Self-pay

## 2024-12-24 NOTE — Telephone Encounter (Signed)
 Pt has been scheduled tele preop appt 12/30/24. Med rec and consent are done.

## 2024-12-24 NOTE — Telephone Encounter (Signed)
 Pt has been scheduled tele preop appt 12/30/24. Med rec and consent are done.      Patient Consent for Virtual Visit        Christy Richardson has provided verbal consent on 12/24/2024 for a virtual visit (video or telephone).   CONSENT FOR VIRTUAL VISIT FOR:  Christy Richardson  By participating in this virtual visit I agree to the following:  I hereby voluntarily request, consent and authorize [facility] and its employed or contracted physicians, physician assistants, nurse practitioners or other licensed health care professionals (the Practitioner), to provide me with telemedicine health care services (the Services) as deemed necessary by the treating Practitioner. I acknowledge and consent to receive the Services by the Practitioner via telemedicine. I understand that the telemedicine visit will involve communicating with the Practitioner through live audiovisual communication technology and the disclosure of certain medical information by electronic transmission. I acknowledge that I have been given the opportunity to request an in-person assessment or other available alternative prior to the telemedicine visit and am voluntarily participating in the telemedicine visit.  I understand that I have the right to withhold or withdraw my consent to the use of telemedicine in the course of my care at any time, without affecting my right to future care or treatment, and that the Practitioner or I may terminate the telemedicine visit at any time. I understand that I have the right to inspect all information obtained and/or recorded in the course of the telemedicine visit and may receive copies of available information for a reasonable fee.  I understand that some of the potential risks of receiving the Services via telemedicine include:  Delay or interruption in medical evaluation due to technological equipment failure or disruption; Information transmitted may not be sufficient (e.g. poor  resolution of images) to allow for appropriate medical decision making by the Practitioner; and/or  In rare instances, security protocols could fail, causing a breach of personal health information.  Furthermore, I acknowledge that it is my responsibility to provide information about my medical history, conditions and care that is complete and accurate to the best of my ability. I acknowledge that Practitioner's advice, recommendations, and/or decision may be based on factors not within their control, such as incomplete or inaccurate data provided by me or distortions of diagnostic images or specimens that may result from electronic transmissions. I understand that the practice of medicine is not an exact science and that Practitioner makes no warranties or guarantees regarding treatment outcomes. I acknowledge that a copy of this consent can be made available to me via my patient portal [facility] MyChart), or I can request a printed copy by calling the office of [facility].    I understand that my insurance will be billed for this visit.   I have read or had this consent read to me. I understand the contents of this consent, which adequately explains the benefits and risks of the Services being provided via telemedicine.  I have been provided ample opportunity to ask questions regarding this consent and the Services and have had my questions answered to my satisfaction. I give my informed consent for the services to be provided through the use of telemedicine in my medical care

## 2024-12-24 NOTE — Telephone Encounter (Signed)
Left message to call back for tele appt 

## 2024-12-24 NOTE — Telephone Encounter (Signed)
" ° °  Name: Christy Richardson  DOB: 09-Dec-1934  MRN: [id number]  Primary Cardiologist: Evalene Lunger, MD   Preoperative team, please contact this patient and set up a phone call appointment for further preoperative risk assessment. Please obtain consent and complete medication review. Thank you for your help.   I also confirmed the patient resides in the state of [place] . As per [facility] Medical Board telemedicine laws, the patient must reside in the state in which the provider is licensed.   Mardy KATHEE Pizza, FNP 12/24/2024, 12:05 PM [facility]    "

## 2024-12-24 NOTE — Telephone Encounter (Signed)
 [facility] Pre-operative Risk Assessment     Request for surgical clearance:     Endoscopy Procedure  What type of surgery is being performed?     EGD  When is this surgery scheduled?     To be decided if medically cleared  What type of clearance is required ?  Cardiology/medical  Are there any medications that need to be held prior to surgery and how long? N/A  Practice name and name of physician performing surgery?      [facility] Dr Lupita Commander  What is your office phone and fax number?      Phone- [phone]  Fax- [phone]  Anesthesia type (None, local, MAC, general) ?       MAC   Please route your response to Almarie Goo, LPN

## 2024-12-24 NOTE — Telephone Encounter (Signed)
 Patient called in and I was able to get her scheduled for 2/9 at 3:15 pm . Just wanted to let you know

## 2024-12-24 NOTE — Telephone Encounter (Signed)
 Patient is returning your call. Please advise. ?

## 2024-12-25 NOTE — Telephone Encounter (Signed)
 Pt called in asking to speak with nurse about her preop visit. She has a couple concerns she would like to discuss.

## 2024-12-25 NOTE — Telephone Encounter (Signed)
 S/w the pt and in our conversation she tells me that Dr. Avram has some concerns that he was going to d/w Dr. Gollan but she was not sure what. Pt tells me that she would prefer to be seen in the office. I offered sooner appts than the 1/30 howver pt was not able to make those appts work.   I cancelled the tele 12/30/24. Pt agrees to plan of care at this time.

## 2024-12-25 NOTE — Telephone Encounter (Signed)
 I spoke to her - she called Dr. Tresia office and was offered a video appointment but she is unable to do so w/ her phone and would like an in person visit.  Tim - this lady needs an EGD and esophageal dilation - I think best in hospital but maybe could be done in our ambulatory center. She needs cardiac clearance and also pulmonary clearance (will be seen there 2/9). Any help you can provide getting her an appointment sometime in February would be greatly appreciated.  Thanks  Lupita

## 2024-12-30 ENCOUNTER — Ambulatory Visit

## 2024-12-30 NOTE — Telephone Encounter (Addendum)
 I s/w the pt and she has rescheduled her tele preop appt to 01/06/25. Med rec and consent are done.       Christy Richardson, Christy Richardson    12/30/24 10:47 AM Note Pt changed her mind again and would like a tele visit instead due to weather.       12/30/24 10:46 AM Christy Richardson HERO contacted Christy Richardson, Christy Richardson    12/24/24  3:45 PM You contacted Christy Richardson, Christy Richardson  Me    12/24/24  3:45 PM Note Pt has been scheduled tele preop appt 12/30/24. Med rec and consent are done.

## 2024-12-30 NOTE — Telephone Encounter (Signed)
 Pt changed her mind again and would like a tele visit instead due to weather.

## 2025-01-01 ENCOUNTER — Ambulatory Visit: Admitting: Nurse Practitioner

## 2025-01-06 ENCOUNTER — Ambulatory Visit: Admitting: Physician Assistant

## 2025-01-06 DIAGNOSIS — Z01810 Encounter for preprocedural cardiovascular examination: Secondary | ICD-10-CM

## 2025-01-06 NOTE — Progress Notes (Signed)
 "   Virtual Visit via Telephone Note   Because of Christy Richardson co-morbid illnesses, she is at least at moderate risk for complications without adequate follow up.  This format is felt to be most appropriate for this patient at this time.  Due to technical limitations with video connection (technology), today's appointment will be conducted as an audio only telehealth visit, and Christy Richardson verbally agreed to proceed in this manner.   All issues noted in this document were discussed and addressed.  No physical exam could be performed with this format.  Evaluation Performed:  Preoperative cardiovascular risk assessment _____________   Date:  01/06/2025   Patient ID:  Christy Richardson, DOB 04/03/1935, MRN [id number] Patient Location:  Home Provider location:   Office  Primary Care Provider:  Shona Norleen PEDLAR, MD Primary Cardiologist:  Evalene Lunger, MD  Chief Complaint / Patient Profile   89 y.o. y/o female with a h/o labile hypertension, anxiety, bronchiectasis, chronic dyspnea, GERD, goiter, hyperlipidemia, and diastolic dysfunction who is pending EGD and presents today for telephonic preoperative cardiovascular risk assessment.  History of Present Illness    Christy Richardson is a 89 y.o. female who presents via audio/video conferencing for a telehealth visit today.  Pt was last seen in cardiology clinic on 09/09/2024 by Medford Meager, NP.  At that time Christy Richardson was doing well other than her chronic dyspnea which had been stable.  The patient is now pending procedure as outlined above. Since her last visit, she has been doing well from a heart standpoint. She had a hip replacement and she is short of breath all the time. Usually she uses a cane to get around.   No new issues with her heart, everything is about the same. She has high blood pressure which has been chronic. She can do indoor house work with rests.  She does meet 4 METS on the DASI.  As far as hospital versus clinic for her  procedure I think if she just had high blood pressure that an in office procedure would be appropriate however, with her lung issues and chronic shortness of breath even with minimal activity I think it would be best for her to have her procedure in the hospital.  Pulmonary clearance was also requested so I will be anxious to see what they say on their end.  No medications indicated as needing held.  Past Medical History    Past Medical History:  Diagnosis Date   Adenomatous polyp of colon 03/2007   Allergic rhinitis    Anxiety    a.) on BZO (lorazepam ) PRN   Arthritis    Blood transfusion 1974   Bronchiectasis (HCC)    Cancer (HCC)    left arm   Deaf    in right 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 Mild TR; b.) TTE 07/16/2019: EF 60-65%, triv AR, mild MR/TR, G1DD.   Diet-controlled type 2 diabetes mellitus (HCC)    Diverticulosis of colon (without mention of hemorrhage)    DOE (dyspnea on exertion)    Dyspnea    Fatigue    Fecal incontinence    GERD (gastroesophageal reflux disease)    Glaucoma    Goiter    right side   Heart murmur    History of stress test    a. 01/2014 Lexi MV: no ischemia/infarct.  Hyperkalemia    Hyperlipidemia    Hypertension    a. 11/2018 Renal artery duplex: No RAS.    Hypothyroidism    IBS (irritable bowel syndrome)    Migraine    Mitral valve prolapse    a. 01/2014 Echo: no MR/MVP noted.   Neck mass    right side/ no airway problems/ per pt has had since 2005 (goiter)   Nonspecific abnormal electrocardiogram (ECG) (EKG)    Palpitations    a. 01/2014 CardioNet: wore for 9 days (rash). Sinus tach - 100's. No SVT/AF.   Pneumonia    Polyp, sigmoid colon    Post-operative nausea and vomiting    Pulmonary fibrosis (HCC)    Pulmonary nodules    Recurrent UTI    Tachycardia    Tremor    Past Surgical History:  Procedure Laterality Date    ABDOMINAL HYSTERECTOMY     CARDIOVASCULAR STRESS TEST  09/19/2011   No scintigraphic evidence of inducible myocardial ischemia. Pharmacological stress test without chest pain or EKG changes for ischemia.   COLONOSCOPY     ESOPHAGOGASTRODUODENOSCOPY     MASTOIDECTOMY     rt ear/ as a teenager   OOPHORECTOMY     SKIN CANCER EXCISION Left    arm   TOTAL HIP ARTHROPLASTY  5/10   left   TOTAL HIP ARTHROPLASTY Right 07/10/2022   Procedure: TOTAL HIP ARTHROPLASTY ANTERIOR APPROACH;  Surgeon: Kathlynn Sharper, MD;  Location: [facility] ORS;  Service: Orthopedics;  Laterality: Right;   TRANSTHORACIC ECHOCARDIOGRAM  09/19/2011   EF >55%, stage 1 diastolic dysfunction, mild tricuspid valve regurg    Allergies  Allergies[1]  Home Medications    Prior to Admission medications  Medication Sig Start Date End Date Taking? Authorizing Provider  acetaminophen  (TYLENOL  8 HOUR ARTHRITIS PAIN) 650 MG CR tablet Take 650 mg by mouth every 8 (eight) hours as needed for pain.    [provider]  albuterol  (PROVENTIL ) (2.5 MG/3ML) 0.083% nebulizer solution Take 3 mLs (2.5 mg total) by nebulization every 6 (six) hours as needed for wheezing or shortness of breath. 07/29/24   Cobb, Comer GAILS, NP  albuterol  (VENTOLIN  HFA) 108 (90 Base) MCG/ACT inhaler Inhale 2 puffs into the lungs every 6 (six) hours as needed for wheezing or shortness of breath. 12/09/24   Cobb, Comer GAILS, NP  amLODipine  (NORVASC ) 5 MG tablet Take 1 tablet (5 mg total) by mouth daily. Take an extra tablet (5 mg) daily as needed for systolic blood pressure greater then 170. 06/08/24   Gollan, Evalene PARAS, MD  brimonidine  (ALPHAGAN ) 0.2 % ophthalmic solution Place 1 drop into both eyes 2 (two) times daily. Place 1 drop into both eyes 2 (two) times daily. 12/21/15   [provider]  carvedilol  (COREG ) 12.5 MG tablet Take 1 tablet (12.5 mg total) by mouth 2 (two) times daily with a meal. 08/06/24   Gollan, Evalene PARAS, MD  cetirizine (ZYRTEC) 10 MG  tablet Take 10 mg by mouth every morning. 03/30/11   Parrett, Madelin RAMAN, NP  Cholecalciferol (VITAMIN D3 PO) Take 1 tablet by mouth daily at 6 (six) AM.    [provider]  cyclobenzaprine  (FLEXERIL ) 10 MG tablet Take 10 mg by mouth 3 (three) times daily as needed for muscle spasms.    [provider]  dorzolamide  (TRUSOPT ) 2 % ophthalmic solution Place 1 drop into both eyes 2 (two) times daily. Place 1 drop into both eyes 2 (two) times daily. 11/03/15   [provider]  Ensifentrine  (OHTUVAYRE ) 3 MG/2.5ML SUSP Inhale 2.5 mLs into the lungs 2 (two) times daily. 07/22/24   Cobb, Comer GAILS, NP  estradiol  (ESTRACE ) 0.1 MG/GM vaginal cream Apply 0.5 gm vaginally with the applicator of tip of the finger nightly for 2 weeks and then 2-3x weekly. 04/18/23   Watt Rush, MD  famotidine  (PEPCID ) 20 MG tablet Take 1 tablet (20 mg total) by mouth 2 (two) times daily. 12/11/24   Cobb, Comer GAILS, NP  furosemide  (LASIX ) 20 MG tablet Take 1 tablet (20 mg total) by mouth daily for 3 days. 09/30/24 12/24/24  Cobb, Comer GAILS, NP  gabapentin (NEURONTIN) 300 MG capsule Take 300 mg by mouth at bedtime.    [provider]  gentamicin ointment (GARAMYCIN) 0.1 % as needed (nose irritation). 11/05/23   [provider]  guaifenesin  (MUCUS RELIEF) 400 MG TABS tablet Take 1,200 mg by mouth every morning.    [provider]  hydrALAZINE  (APRESOLINE ) 50 MG tablet Take 0.5 tablets (25 mg total) by mouth daily as needed. 09/27/22   Vivienne Lonni Ingle, NP  ipratropium (ATROVENT ) 0.06 % nasal spray Place 2 sprays into both nostrils 3 (three) times daily. 07/22/24   Malachy Comer GAILS, NP  levothyroxine  (SYNTHROID ) 75 MCG tablet Take 75 mcg by mouth daily before breakfast. 03/19/22   [provider]  LORazepam  (ATIVAN ) 0.5 MG tablet Take 0.5 mg by mouth 2 (two) times daily as needed for anxiety (tremors).    [provider]  losartan  (COZAAR ) 100 MG tablet TAKE ONE  TABLET BY MOUTH ONCE DAILY. 07/28/24   Gollan, Timothy J, MD  methylPREDNISolone  (MEDROL  DOSEPAK) 4 MG TBPK tablet Take as directed 09/30/24   Cobb, Katherine V, NP  Multiple Vitamins-Minerals (MULTIVITAMIN WITH MINERALS) tablet Take 1 tablet by mouth 3 (three) times a week.    [provider]  nitrofurantoin , macrocrystal-monohydrate, (MACROBID ) 100 MG capsule Take 100 mg by mouth every 12 (twelve) hours. 07/20/24   [provider]  pantoprazole  (PROTONIX ) 40 MG tablet Take 1 tablet (40 mg total) by mouth 2 (two) times daily. 05/26/24   Gollan, Timothy J, MD  Polyethyl Glycol-Propyl Glycol (SYSTANE OP) Apply 1 drop to eye as needed.    [provider]  pravastatin  (PRAVACHOL ) 40 MG tablet Take 1 tablet (40 mg total) by mouth at bedtime. 03/30/11   Parrett, Madelin RAMAN, NP  Probiotic Product (PROBIOTIC PO) Take 1 tablet by mouth daily at 6 (six) AM.    [provider]  rOPINIRole (REQUIP) 0.5 MG tablet Take 0.5 mg by mouth at bedtime. 09/05/22   [provider]  sertraline  (ZOLOFT ) 100 MG tablet Take 2 tablets by mouth every morning. 12/19/21   [provider]  sodium chloride  (OCEAN) 0.65 % nasal spray Place 2 sprays into both nostrils daily at 6 (six) AM. Use as needed 03/30/11   Parrett, Tammy S, NP  sodium chloride  HYPERTONIC 3 % nebulizer solution Take as 3 mL neb as needed for cough or chest congestion 12/11/24   Cobb, Comer GAILS, NP  Tiotropium Bromide -Olodaterol (STIOLTO RESPIMAT ) 2.5-2.5 MCG/ACT AERS Inhale 2 puffs into the lungs daily. 09/16/24   Darlean Ozell NOVAK, MD  traMADol  (ULTRAM ) 50 MG tablet Take 1 tablet (50 mg total) by mouth every 6 (six) hours as needed. 07/13/22   Charlene Debby BROCKS, PA-C  traZODone  (DESYREL ) 50 MG tablet Take 50 mg by mouth at bedtime.    [provider]  triamcinolone  (NASACORT ) 55 MCG/ACT AERO nasal inhaler Place 1 spray  into the nose at bedtime.    [provider]    Physical Exam    Vital Signs:   Christy Richardson does not have vital signs available for review today.  176/81, before her medications.  Given telephonic nature of communication, physical exam is limited. AAOx3. NAD. Normal affect.  Speech and respirations are unlabored.  Accessory Clinical Findings    None  Assessment & Plan    1.  Preoperative Cardiovascular Risk Assessment:  Christy Richardson perioperative risk of a major cardiac event is 0.4% according to the Revised Cardiac Risk Index (RCRI).  Therefore, she is at low risk for perioperative complications.   Her functional capacity is fair at 4.73 METs according to the [place] Activity Status Index (DASI). Recommendations: According to ACC/AHA guidelines, no further cardiovascular testing needed.  The patient may proceed to surgery at acceptable risk.    The patient was advised that if she develops new symptoms prior to surgery to contact our office to arrange for a follow-up visit, and she verbalized understanding.   A copy of this note will be routed to requesting surgeon.  Time:   Today, I have spent 10 minutes with the patient with telehealth technology discussing medical history, symptoms, and management plan.     Orren LOISE Fabry, PA-C  01/06/2025, 8:27 AM      [1]  Allergies Allergen Reactions   Prednisone     REACTION: Increase eye pressure with gloucoma. Broke out in rash after injection per Dr. Beverley   Tramadol  Itching   Adhesive [Tape]     Causes blisters/irritates skin-surgical tape   Penicillins     REACTION: Rash   Sulfonamide Derivatives     REACTION: Rash - not sure   "

## 2025-01-06 NOTE — Telephone Encounter (Signed)
 Patient has cardiac clearance.

## 2025-01-11 ENCOUNTER — Ambulatory Visit: Admitting: Pulmonary Disease

## 2025-01-11 DIAGNOSIS — J471 Bronchiectasis with (acute) exacerbation: Secondary | ICD-10-CM

## 2025-01-11 DIAGNOSIS — R0609 Other forms of dyspnea: Secondary | ICD-10-CM

## 2025-01-11 DIAGNOSIS — J441 Chronic obstructive pulmonary disease with (acute) exacerbation: Secondary | ICD-10-CM

## 2025-02-09 ENCOUNTER — Ambulatory Visit: Admitting: Pulmonary Disease

## 2025-02-11 ENCOUNTER — Encounter (HOSPITAL_COMMUNITY): Payer: Self-pay

## 2025-02-11 ENCOUNTER — Ambulatory Visit (HOSPITAL_COMMUNITY): Admit: 2025-02-11 | Admitting: Internal Medicine
